# Patient Record
Sex: Female | Born: 1965 | Race: White | Hispanic: No | Marital: Married | Smoking: Never smoker
Health system: Southern US, Community
[De-identification: ages and names within clinical notes are randomized; demographics above are authoritative.]

## PROBLEM LIST (undated history)

## (undated) DIAGNOSIS — R7303 Prediabetes: Secondary | ICD-10-CM

## (undated) DIAGNOSIS — K469 Unspecified abdominal hernia without obstruction or gangrene: Secondary | ICD-10-CM

## (undated) DIAGNOSIS — R011 Cardiac murmur, unspecified: Secondary | ICD-10-CM

## (undated) DIAGNOSIS — Z9889 Other specified postprocedural states: Secondary | ICD-10-CM

## (undated) DIAGNOSIS — T8859XA Other complications of anesthesia, initial encounter: Secondary | ICD-10-CM

## (undated) DIAGNOSIS — J45909 Unspecified asthma, uncomplicated: Secondary | ICD-10-CM

## (undated) DIAGNOSIS — R112 Nausea with vomiting, unspecified: Secondary | ICD-10-CM

## (undated) DIAGNOSIS — I10 Essential (primary) hypertension: Secondary | ICD-10-CM

## (undated) HISTORY — PX: ABDOMINAL HYSTERECTOMY: SHX81

## (undated) HISTORY — PX: APPENDECTOMY: SHX54

## (undated) HISTORY — PX: MYOMECTOMY: SHX85

---

## 1997-09-24 ENCOUNTER — Other Ambulatory Visit: Admission: RE | Admit: 1997-09-24 | Discharge: 1997-09-24 | Payer: Self-pay | Admitting: Gynecology

## 1997-10-28 ENCOUNTER — Encounter: Admission: RE | Admit: 1997-10-28 | Discharge: 1998-01-26 | Payer: Self-pay | Admitting: Gynecology

## 1998-02-26 ENCOUNTER — Encounter: Admission: RE | Admit: 1998-02-26 | Discharge: 1998-05-27 | Payer: Self-pay | Admitting: Obstetrics and Gynecology

## 1998-05-04 ENCOUNTER — Inpatient Hospital Stay (HOSPITAL_COMMUNITY): Admission: AD | Admit: 1998-05-04 | Discharge: 1998-05-07 | Payer: Self-pay | Admitting: Gynecology

## 1998-05-13 ENCOUNTER — Inpatient Hospital Stay (HOSPITAL_COMMUNITY): Admission: AD | Admit: 1998-05-13 | Discharge: 1998-05-13 | Payer: Self-pay | Admitting: Obstetrics and Gynecology

## 1998-05-14 ENCOUNTER — Inpatient Hospital Stay (HOSPITAL_COMMUNITY): Admission: AD | Admit: 1998-05-14 | Discharge: 1998-05-14 | Payer: Self-pay | Admitting: Gynecology

## 1998-06-30 ENCOUNTER — Other Ambulatory Visit: Admission: RE | Admit: 1998-06-30 | Discharge: 1998-06-30 | Payer: Self-pay | Admitting: Gynecology

## 1998-11-24 ENCOUNTER — Other Ambulatory Visit: Admission: RE | Admit: 1998-11-24 | Discharge: 1998-11-24 | Payer: Self-pay | Admitting: Gynecology

## 1999-01-13 ENCOUNTER — Encounter (INDEPENDENT_AMBULATORY_CARE_PROVIDER_SITE_OTHER): Payer: Self-pay | Admitting: Specialist

## 1999-01-13 ENCOUNTER — Other Ambulatory Visit: Admission: RE | Admit: 1999-01-13 | Discharge: 1999-01-13 | Payer: Self-pay | Admitting: Gynecology

## 1999-04-13 ENCOUNTER — Other Ambulatory Visit: Admission: RE | Admit: 1999-04-13 | Discharge: 1999-04-13 | Payer: Self-pay | Admitting: Gynecology

## 1999-07-13 ENCOUNTER — Other Ambulatory Visit: Admission: RE | Admit: 1999-07-13 | Discharge: 1999-07-13 | Payer: Self-pay | Admitting: Gynecology

## 1999-08-03 ENCOUNTER — Encounter: Admission: RE | Admit: 1999-08-03 | Discharge: 1999-11-01 | Payer: Self-pay | Admitting: Gynecology

## 2000-07-23 ENCOUNTER — Other Ambulatory Visit: Admission: RE | Admit: 2000-07-23 | Discharge: 2000-07-23 | Payer: Self-pay | Admitting: Gynecology

## 2003-01-23 ENCOUNTER — Other Ambulatory Visit: Admission: RE | Admit: 2003-01-23 | Discharge: 2003-01-23 | Payer: Self-pay | Admitting: Gynecology

## 2004-01-15 ENCOUNTER — Emergency Department (HOSPITAL_COMMUNITY): Admission: EM | Admit: 2004-01-15 | Discharge: 2004-01-15 | Payer: Self-pay | Admitting: Family Medicine

## 2004-01-25 ENCOUNTER — Other Ambulatory Visit: Admission: RE | Admit: 2004-01-25 | Discharge: 2004-01-25 | Payer: Self-pay | Admitting: Gynecology

## 2004-05-23 ENCOUNTER — Emergency Department (HOSPITAL_COMMUNITY): Admission: EM | Admit: 2004-05-23 | Discharge: 2004-05-23 | Payer: Self-pay | Admitting: Family Medicine

## 2004-06-12 ENCOUNTER — Emergency Department (HOSPITAL_COMMUNITY): Admission: EM | Admit: 2004-06-12 | Discharge: 2004-06-12 | Payer: Self-pay | Admitting: Family Medicine

## 2004-06-13 ENCOUNTER — Emergency Department (HOSPITAL_COMMUNITY): Admission: EM | Admit: 2004-06-13 | Discharge: 2004-06-13 | Payer: Self-pay | Admitting: Family Medicine

## 2005-09-26 ENCOUNTER — Emergency Department (HOSPITAL_COMMUNITY): Admission: EM | Admit: 2005-09-26 | Discharge: 2005-09-26 | Payer: Self-pay | Admitting: Family Medicine

## 2005-11-28 ENCOUNTER — Other Ambulatory Visit: Admission: RE | Admit: 2005-11-28 | Discharge: 2005-11-28 | Payer: Self-pay | Admitting: Gynecology

## 2006-03-24 ENCOUNTER — Emergency Department (HOSPITAL_COMMUNITY): Admission: EM | Admit: 2006-03-24 | Discharge: 2006-03-24 | Payer: Self-pay | Admitting: Family Medicine

## 2006-09-19 ENCOUNTER — Emergency Department (HOSPITAL_COMMUNITY): Admission: EM | Admit: 2006-09-19 | Discharge: 2006-09-19 | Payer: Self-pay | Admitting: Family Medicine

## 2007-06-01 ENCOUNTER — Emergency Department (HOSPITAL_COMMUNITY): Admission: EM | Admit: 2007-06-01 | Discharge: 2007-06-01 | Payer: Self-pay | Admitting: Family Medicine

## 2007-07-24 ENCOUNTER — Emergency Department (HOSPITAL_COMMUNITY): Admission: EM | Admit: 2007-07-24 | Discharge: 2007-07-24 | Payer: Self-pay | Admitting: Family Medicine

## 2008-03-18 ENCOUNTER — Encounter: Payer: Self-pay | Admitting: Women's Health

## 2008-03-18 ENCOUNTER — Ambulatory Visit: Payer: Self-pay | Admitting: Women's Health

## 2008-03-18 ENCOUNTER — Other Ambulatory Visit: Admission: RE | Admit: 2008-03-18 | Discharge: 2008-03-18 | Payer: Self-pay | Admitting: Obstetrics and Gynecology

## 2008-04-01 ENCOUNTER — Ambulatory Visit: Payer: Self-pay | Admitting: Women's Health

## 2008-06-25 ENCOUNTER — Ambulatory Visit: Payer: Self-pay | Admitting: Obstetrics & Gynecology

## 2008-06-25 LAB — CONVERTED CEMR LAB
CA 125: 11.1 units/mL (ref 0.0–30.2)
HCT: 38.1 % (ref 36.0–46.0)
Hemoglobin: 11.9 g/dL — ABNORMAL LOW (ref 12.0–15.0)
MCHC: 31.2 g/dL (ref 30.0–36.0)
MCV: 74.4 fL — ABNORMAL LOW (ref 78.0–100.0)
Platelets: 276 10*3/uL (ref 150–400)
RBC: 5.12 M/uL — ABNORMAL HIGH (ref 3.87–5.11)
RDW: 16.9 % — ABNORMAL HIGH (ref 11.5–15.5)
WBC: 10.3 10*3/uL (ref 4.0–10.5)

## 2008-06-30 ENCOUNTER — Ambulatory Visit (HOSPITAL_COMMUNITY): Admission: RE | Admit: 2008-06-30 | Discharge: 2008-06-30 | Payer: Self-pay | Admitting: Obstetrics & Gynecology

## 2008-07-09 ENCOUNTER — Ambulatory Visit: Payer: Self-pay | Admitting: Obstetrics and Gynecology

## 2008-07-28 ENCOUNTER — Inpatient Hospital Stay (HOSPITAL_COMMUNITY): Admission: RE | Admit: 2008-07-28 | Discharge: 2008-08-01 | Payer: Self-pay | Admitting: Obstetrics & Gynecology

## 2008-07-28 ENCOUNTER — Encounter: Payer: Self-pay | Admitting: Obstetrics & Gynecology

## 2008-07-28 ENCOUNTER — Ambulatory Visit: Payer: Self-pay | Admitting: Obstetrics & Gynecology

## 2008-08-17 ENCOUNTER — Ambulatory Visit: Payer: Self-pay | Admitting: Advanced Practice Midwife

## 2008-08-17 ENCOUNTER — Inpatient Hospital Stay (HOSPITAL_COMMUNITY): Admission: AD | Admit: 2008-08-17 | Discharge: 2008-08-17 | Payer: Self-pay | Admitting: Obstetrics & Gynecology

## 2008-09-10 ENCOUNTER — Ambulatory Visit: Payer: Self-pay | Admitting: Obstetrics & Gynecology

## 2009-02-04 ENCOUNTER — Encounter: Admission: RE | Admit: 2009-02-04 | Discharge: 2009-02-04 | Payer: Self-pay | Admitting: General Surgery

## 2010-08-24 LAB — POCT URINALYSIS DIP (DEVICE)
Bilirubin Urine: NEGATIVE
Glucose, UA: NEGATIVE mg/dL
Hgb urine dipstick: NEGATIVE
Ketones, ur: NEGATIVE mg/dL
Nitrite: NEGATIVE
Protein, ur: NEGATIVE mg/dL
Specific Gravity, Urine: 1.02 (ref 1.005–1.030)
Urobilinogen, UA: 0.2 mg/dL (ref 0.0–1.0)
pH: 6 (ref 5.0–8.0)

## 2010-08-25 LAB — URINALYSIS, ROUTINE W REFLEX MICROSCOPIC
Bilirubin Urine: NEGATIVE
Glucose, UA: NEGATIVE mg/dL
Ketones, ur: NEGATIVE mg/dL
Leukocytes, UA: NEGATIVE
Nitrite: NEGATIVE
Protein, ur: NEGATIVE mg/dL
Specific Gravity, Urine: 1.02 (ref 1.005–1.030)
Urobilinogen, UA: 0.2 mg/dL (ref 0.0–1.0)
pH: 6 (ref 5.0–8.0)

## 2010-08-25 LAB — CBC
HCT: 27.2 % — ABNORMAL LOW (ref 36.0–46.0)
HCT: 37.7 % (ref 36.0–46.0)
Hemoglobin: 8.9 g/dL — ABNORMAL LOW (ref 12.0–15.0)
Hemoglobin: 12.2 g/dL (ref 12.0–15.0)
MCHC: 32.6 g/dL (ref 30.0–36.0)
MCHC: 32.3 g/dL (ref 30.0–36.0)
MCV: 77.8 fL — ABNORMAL LOW (ref 78.0–100.0)
MCV: 77.2 fL — ABNORMAL LOW (ref 78.0–100.0)
Platelets: 222 10*3/uL (ref 150–400)
Platelets: 231 10*3/uL (ref 150–400)
RBC: 3.5 MIL/uL — ABNORMAL LOW (ref 3.87–5.11)
RBC: 4.89 MIL/uL (ref 3.87–5.11)
RDW: 20.9 % — ABNORMAL HIGH (ref 11.5–15.5)
RDW: 21 % — ABNORMAL HIGH (ref 11.5–15.5)
WBC: 11.5 10*3/uL — ABNORMAL HIGH (ref 4.0–10.5)
WBC: 5.8 10*3/uL (ref 4.0–10.5)

## 2010-08-25 LAB — BASIC METABOLIC PANEL
BUN: 8 mg/dL (ref 6–23)
CO2: 31 mEq/L (ref 19–32)
Calcium: 9.1 mg/dL (ref 8.4–10.5)
Chloride: 100 mEq/L (ref 96–112)
Creatinine, Ser: 0.64 mg/dL (ref 0.4–1.2)
GFR calc Af Amer: >60 mL/min (ref >60)
GFR calc non Af Amer: >60 mL/min (ref >60)
Glucose, Bld: 97 mg/dL (ref 70–99)
Potassium: 3.3 mEq/L — ABNORMAL LOW (ref 3.5–5.1)
Sodium: 138 mEq/L (ref 135–145)

## 2010-08-25 LAB — URINE MICROSCOPIC-ADD ON

## 2010-08-25 LAB — PREGNANCY, URINE: Preg Test, Ur: NEGATIVE

## 2010-09-27 NOTE — Group Therapy Note (Signed)
NAME:  Kaitlyn Bray, Kaitlyn Bray NO.:  [id number]   MEDICAL RECORD NO.:  [id number]          PATIENT TYPE:  WOC   LOCATION:  [facility]                   FACILITY:  [facility]   PHYSICIAN:  Argentina Donovan, MD        DATE OF BIRTH:  20-Mar-1966   DATE OF SERVICE:  07/09/2008                                  CLINIC NOTE   The patient is a 45 year old Caucasian female, gravida 1, para 1-0-0-1  with child 81 years old who was in several weeks ago and saw Dr. Scheryl Darter.  At that time, she came in because of right adnexal complex  cyst.  He sent her for a CT confirmation which showed a complex cystic  mass in the right adnexa with enhancing internal septations and  worrisome for cyst adenoma or cystadenocarcinoma.  Excision was  recommended.  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  CA-125 that  he ran was 11.1.  The patient has been on iron for anemia and her last  hemoglobin was 11.9 with a hematocrit of 38.1.  She is in good health,  has just recently been started on hydrochlorothiazide 25 a day for  hypertension but today her blood pressure was 121/84.  The patient is 5  feet 4 inches tall and weighs 226 pounds and has an ALLERGY TO SULFA AND  CODEINE.   MEDICATIONS:  The only medication she is taking is hydrochlorothiazide,  Slow Female, and Advil for occasional aches and pains.   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  I am going to talk  to Dr. Debroah Loop about the case.  He requested I fill out the papers for  surgery which I am going  to do.  The patient is being seen by a  practitioner nearby and I have asked her to have them do a history and  physical on her as she is seeing them in the next week or so.  I am  going to have her come in and see Dr. Debroah Loop prior to the surgery so he  remembers who the patient is and can handle any other suggestions that  he may want to take care of.   IMPRESSION:  Complex right ovarian cyst with a fibroid uterus suspicious  for cyst adenoma or cystadenocarcinoma.           ______________________________  Argentina Donovan, MD     PR/MEDQ  D:  07/09/2008  T:  07/09/2008  Job:  [id number]

## 2010-09-27 NOTE — Group Therapy Note (Signed)
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 Adine Madura and Dr. Claud Kelp on July 28, 2008.  The patient had  uncomplicated postoperative course and returns now for her 6-week  postoperative checkup.  She was treated for cellulitis in the ER several  weeks ago but is doing fine now.  She is complaining of this right lower  quadrant mass effect and I did look up it on a CT scan done June 30, 2008 and there is a lower anterior abdominal wall hernia containing only  fat and when you palpate this area, it does seem like there is some  fatty tissue that is forming a mass.  The patient is also having some  urgency.  She had urgency before the procedure and it seems to be a  little better but still present.  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  If it starts to impede her lifestyle more, she would  consider going on one of these muscarinic inhibitors like Ditropan.   PHYSICAL EXAM:  Temperature 98.6, pulse 89, blood pressure 120/72.  Weight 222 pounds, height 5 feet 4-1/2 inches.  GENERAL:  Well nourished, well developed, in no apparent distress.  HEENT:  Normocephalic, atraumatic with left lazy eye.  ABDOMEN:  Obese, soft, nontender.  The right lower mass can be felt when  the patient is upright, not in supine position.  The Pfannenstiel  incision is well-healed.  GENITALIA:  Tanner V.  Vagina pink.  Normal rugae.  The vaginal vault is  intact.  Bimanual is unremarkable.   ASSESSMENT AND  PLAN:  A 45 year old female status post total abdominal  hysterectomy, right salpingo-oophorectomy, excision of endometrium off  the rectosigmoid, and appendectomy.  1. Right abdominal wall hernia.  Referred to general surgery for      evaluation of hernia.  2. The patient will contact us if she would like to be treated for her      urge incontinence.  3. The patient is originally a patient of [facility] but      they transferred her to [place] due to lack of insurance and needing      surgery.  She would like to go back to them and they are at liberty      to request her records when she does return there.  4. I will let them take care of all of her health care maintenance at      this point.  She did have a normal Pap smear in 2009 and there was  no evidence of dysplasia on the cervix, so she would not need a Pap      smear any more.           ______________________________  Elsie Lincoln, MD     KL/MEDQ  D:  09/10/2008  T:  09/10/2008  Job:  [id number]

## 2010-09-27 NOTE — Op Note (Signed)
Kaitlyn Bray, Kaitlyn Bray               ACCOUNT NO.:  [id number]   MEDICAL RECORD NO.:  [id number]          PATIENT TYPE:  INP   LOCATION:  9312                          [facility]   PHYSICIAN:  Angelia Mould. Derrell Lolling, M.D.DATE OF BIRTH:  1965-10-27   DATE OF PROCEDURE:  07/28/2008  DATE OF DISCHARGE:                               OPERATIVE REPORT   PREOPERATIVE DIAGNOSES:  1. Menorrhagia.  2. Complex right adnexal mass.  3. Fibroid uterus.   POSTOPERATIVE DIAGNOSES:  1. Menorrhagia.  2. Complex right adnexal mass.  3. Fibroid uterus, plus cystic mass of wall of rectosigmoid colon, and      mass versus inflammatory change of appendix, severe adhesions.   OPERATION PERFORMED:  1. Intraoperative general surgery consultation.  2. Excision of cystic mass of the right posterior lateral wall of the      rectosigmoid colon.  3. Indicated appendectomy.   SURGEON:  Dr. Angelia Mould. Derrell Lolling, MD   ASSISTANT:  Dr. Scheryl Darter, MD.   OPERATIVE INDICATIONS:  This is a 45 year old woman who was brought to  the operating room electively by Dr. Scheryl Darter today because of  menorrhagia, a fibroid uterus, and a complex right adnexal mass.  Past  history is consistent with hypertension and asthma, tobacco abuse.   Dr. Debroah Loop was performing a hysterectomy and bilateral salpingo-  oophorectomy through a Pfannenstiel incision today.  He called me to  evaluate a palpable mass in the rectosigmoid colon and also a very  thickened abnormal appendix that was caught up in adhesions.  He had  already performed a hysterectomy.   OPERATIVE FINDINGS:  When I scrubbed in, I found that the uterus were  surgically absent.  The cecum looked normal.  The appendix had been  dissected free from pelvic adhesions and the base of the appendix felt  normal, but the last centimeter and half was markedly thickened.  I did  not know whether this was due to chronic inflammation or malignancy.  Because of this, I felt  appendectomy would be indicated.  In the  rectosigmoid colon on the right lateral and right posterior side, the  right adnexa were densely adherent to this, and there was a palpable  cystic process seemingly in the wall of the colon.  Once I dissected  this free, I found that this was extrinsic to the colon and possibly  related to the adnexal process.   OPERATIVE TECHNIQUE:  The gynecologic procedure will be dictated by Dr.  Scheryl Darter.   I scrubbed in and explored the lower abdomen and pelvis and findings as  described above.  I examined and identified the rectum and the distal  sigmoid colon.  I examined and identified the cecum and the appendix.  I  examined and identified the right adnexa which was densely adherent to  the right posterolateral wall of the rectosigmoid colon.  Palpation of  this colon mass suggested a cystic smooth process.  I dissected the  right adnexal tissues off the colon with sharp and blunt dissection and  this was uneventful.  There was no evidence of  any cancer or invasion of  the wall of the colon.  We did not see any diverticula here.  We did not  see any serosal injury.  Once I dissected the adnexa off, I could rotate  the colon around and see that the cystic process was adherent to the  wall of the rectosigmoid, a chronic soft adhesions, but was not part of  the colon.  We slowly dissected all of the soft filmy adhesions and  removed the cystic mass.  A couple of bleeders were controlled with  electrocautery.  I examined the rectosigmoid colon and it appeared  completely intact with no signs of any injury, not even a serosal tear.  I did not see any diverticula in this area either.  We irrigated out  this area.   We talked about removing the appendix.  Because there was a mass effect  in the distal appendix, I felt that it needed to be removed and Dr.  Debroah Loop agreed.  The mesentery was controlled with hemostats, divided,  and ligated with 2-0 Vicryl  ties.  This took 2 or 3 steps.  We then had  the base of the appendix completely exposed.  The pursestring suture of  3-0 silk was placed in the cecum around the base of the appendix.  The  base of the appendix was tied off with a 3-0 Vicryl time and then we  clamped the appendix and amputated.  The stump of the appendix was  inverted and the pursestring suture tied.  This looked pretty good, but  I did a second Z stitch of 3-0 silk to further imbricate the  seromuscular wall of the cecum.  This provided a very nice imbricated  closure of the cecal wall.  I then irrigated out the lower abdomen and  pelvis.  There did not seem to be any bleeding anywhere except perhaps  from the right adnexa.  Dr. Debroah Loop stated that he was going to proceed  with resection of the right adnexa.   At this point, I scrubbed out of the case.  Estimated blood loss during  the time I was there was probably about 30 mL and complications none.      Angelia Mould. Derrell Lolling, M.D.  Electronically Signed     HMI/MEDQ  D:  07/28/2008  T:  07/29/2008  Job:  [id number]   cc:   Scheryl Darter, MD

## 2010-09-27 NOTE — Op Note (Signed)
Kaitlyn Bray, Kaitlyn Bray               ACCOUNT NO.:  [id number]   MEDICAL RECORD NO.:  [id number]          PATIENT TYPE:  INP   LOCATION:  9312                          [facility]   PHYSICIAN:  Scheryl Darter, MD       DATE OF BIRTH:  1965-11-05   DATE OF PROCEDURE:  07/28/2008  DATE OF DISCHARGE:                               OPERATIVE REPORT   PROCEDURE:  Total abdominal hysterectomy, lysis of adhesions, right  salpingo-oophorectomy, appendectomy, and removal of the cyst from the  rectosigmoid colon.   PREOPERATIVE DIAGNOSES:  Fibroid uterus and complex right adnexal mass.   POSTOPERATIVE DIAGNOSES:  1. Fibroid uterus.  2. Pelvic cyst adherent to the rectosigmoid colon.  3. Cystic right ovary.  4. Pelvic adhesions.   SURGEON:  Scheryl Darter, MD.   ASSISTANT:  Dr. Okey Dupre.   Intraoperative consult by Dr. Derrell Lolling for appendectomy and removal of the  rectosigmoid cyst.   ESTIMATED BLOOD LOSS:  800 mL.   COMPLICATIONS:  None.   DRAINS:  Foley catheter.   COUNTS:  Correct.   OPERATIVE COURSE:  The patient gave written consent for total abdominal  hysterectomy, right salpingo-oophorectomy, and possible bilateral  salpingo-oophorectomy due to fibroid uterus, menorrhagia, anemia, and a  complex right adnexal mass.  The patient's identification was confirmed.  She was brought to the OR and general anesthesia was induced.  She was  placed in dorsal supine position.  Abdomen and vagina were sterilely  prepped and draped, and a Foley catheter was placed.  A #10 blade was  used to make Pfannenstiel incision.  Incision was carried down to the  fascia.  Peritoneum was entered at the same time as the fascia.  The  incision was extended transversely with Mayo scissors and the fascia was  separated from underlying adhesions to the rectus muscle.  There were  adhesions of omentum to the anterior wall which were taken down using  cautery.  The uterus was moderately enlarged with fibroids.   Left ovary  was seen and appeared normal.  There was dense adhesions on the right  side of the uterus.  The ovary was not easily visible.  Moist lap pads  were used to pack bowel and a self-retaining retractor was placed.  Uterus was elevated by clamping across the adnexa with Kelly clamps.  A  right round ligament was clamped, cut, and suture ligated with 0 Vicryl  allowing access to the anterior leaf of the broad ligament which was  opened with Metzenbaum scissors.  Window through the posterior leaf of  the broad ligament was created and the adnexal pedicle was cross clamped  distal to the ovary.  Pedicle was cut and double ligatures were placed  with 0 Vicryl.  Good hemostasis was seen.  The uterine vessels on the  left side were skeletonized and the right adnexa was clamped and opened.  The double ligatures were placed.  The round ligament is difficult to  identify on the left.  The anterior leaf of the broad ligament was  opened and bladder was dissected away from the anterior uterus.  The  uterine vessels were identified on the right side and clamped with  Heaney clamps, cut, and suture ligated with 0 Vicryl.  Good hemostasis  was seen.  Likewise, uterine vessels on both sides were clamped, cut,  and suture ligated.  The cardinal ligaments were clamped, cut, and  suture ligated.  At that point, it was elected to remove the uterine  fundus which was done with Mayo scissors.  The cervix was grasped and  elevated with Allis clamps.  The uterosacral ligaments were clamped,  cut, and suture ligated, and the vagina was entered, and the cervix was  removed.  Vaginal cuff was closed with a running locking suture with 0  Vicryl.  Hemostasis was obtained with interrupted sutures with 0 Vicryl.  Attention turned to identifying the mass on the right side.  Right neck  was densely adherent to the right pelvic sidewall.  This also appeared  to be adherent to the rectosigmoid colon.  The cystic  mass adherent or  intrinsic to the rectosigmoid colon was identified and it was difficult  to find a tissue plane for dissection.  Dr. Derrell Lolling was consulted and he  performed intraoperative consult.  He dictated his portion of the  procedure.  The cystic mass was dissected from the rectosigmoid colon.  The bowel appeared to be intact and did not require any repair.  At this  point, the right adnexa which was adherent to the sidewall appeared to  have chocolate cyst fluid draining and is consistent with possible  endometriomas.  Identified the right infundibulopelvic ligament.  This  was cross clamped, cut, and suture ligated.  Good hemostasis was seen.  The ovary was dissected away from the sidewall and adhesions by clamping  with right angles.  Right angle clamps, cutting, and suture ligated.  Mass was completely excised and sent to pathology.  There was a good  hemostasis.  Pelvis was irrigated.  As the anterior peritoneum could not  be closed, it was elected to close the fascia.  All packs were removed  and self-retaining retractors were removed.  Fascia was closed with  running suture with 0 Vicryl.  Skin incision showed good hemostasis and  was irrigated.  Skin was closed with running subcuticular suture with 4-  0 Vicryl.  Sterile dressing was applied.  Please note that Dr. Derrell Lolling  also performed an appendectomy.  The patient tolerated the procedure  well without complications.  Estimated blood loss was about 800 mL.  She  is brought in stable condition to recovery room.      Scheryl Darter, MD  Electronically Signed     JA/MEDQ  D:  07/28/2008  T:  07/29/2008  Job:  [id number]

## 2010-09-27 NOTE — Discharge Summary (Signed)
NAMEELDINE, RENCHER               ACCOUNT NO.:  [id number]   MEDICAL RECORD NO.:  [id number]          PATIENT TYPE:  INP   LOCATION:  9312                          [facility]   PHYSICIAN:  Lesly Dukes, M.D. DATE OF BIRTH:  [birth year]   DATE OF ADMISSION:  07/28/2008  DATE OF DISCHARGE:  08/01/2008                               DISCHARGE SUMMARY   HOSPITAL COURSE:  The patient is 45 year old female who underwent total  abdominal hysterectomy, right salpingo-oophorectomy, lysis of adhesions,  appendectomy, right adnexal mass resection from rectosigmoid on July 28, 2008.  This was done for fibroid uterus and right adnexal mass.  There was a general surgery consult done for extensive adhesions and  details of this can be seen in the dictated operative note.  The patient  had a normal postoperative care with minimal bowel sounds and no flatus  on day #2.  She was kept until day #3 and now she is passing up well.  The patient's discharge laboratory values showed white blood cell count  of 11.5, hemoglobin 8.9, hematocrit 27.2, platelet 222.   DISCHARGE MEDICATIONS:  1. Hydrochlorothiazide 25 mg 1 tablet p.o. daily.  2. Ferrous sulfate 325 mg 1 tablet p.o. b.i.d.  3. Colace 100 mg 1 tablet p.o. b.i.d.  4. Percocet 5/325 one to two tablets p.o. q.4-6 h. p.r.n.   DISCHARGE CONDITION:  Stable.   DISCHARGE INSTRUCTIONS:  1. No heavy lifting x6 weeks.  2. No vaginal intercourse/vaginal rest for 6 weeks.  3. The patient to come to the emergency room or call our clinic with      fever, intractable nausea, vomiting, severe abdominal pain, or      opening of the wound.  4. Come back to our clinic in 6 weeks for a postoperative checkup      Lesly Dukes, M.D.  Electronically Signed     KHL/MEDQ  D:  08/01/2008  T:  08/02/2008  Job:  [id number]

## 2011-10-17 ENCOUNTER — Encounter (HOSPITAL_COMMUNITY): Payer: Self-pay | Admitting: *Deleted

## 2011-10-17 ENCOUNTER — Emergency Department (HOSPITAL_COMMUNITY)
Admission: EM | Admit: 2011-10-17 | Discharge: 2011-10-17 | Disposition: A | Discharge status: Home / Self Care | Payer: Self-pay | Attending: Emergency Medicine | Admitting: Emergency Medicine

## 2011-10-17 DIAGNOSIS — I10 Essential (primary) hypertension: Secondary | ICD-10-CM | POA: Insufficient documentation

## 2011-10-17 DIAGNOSIS — G44209 Tension-type headache, unspecified, not intractable: Secondary | ICD-10-CM | POA: Insufficient documentation

## 2011-10-17 HISTORY — DX: Essential (primary) hypertension: I10

## 2011-10-17 LAB — DIFFERENTIAL
Basophils Absolute: 0 10*3/uL (ref 0.0–0.1)
Basophils Relative: 0 % (ref 0–1)
Eosinophils Absolute: 0.2 10*3/uL (ref 0.0–0.7)
Eosinophils Relative: 3 % (ref 0–5)
Lymphocytes Relative: 27 % (ref 12–46)
Lymphs Abs: 2.4 10*3/uL (ref 0.7–4.0)
Monocytes Absolute: 0.5 10*3/uL (ref 0.1–1.0)
Monocytes Relative: 5 % (ref 3–12)
Neutro Abs: 5.8 10*3/uL (ref 1.7–7.7)
Neutrophils Relative %: 65 % (ref 43–77)

## 2011-10-17 LAB — COMPREHENSIVE METABOLIC PANEL
ALT: 12 U/L (ref 0–35)
AST: 15 U/L (ref 0–37)
Albumin: 3.7 g/dL (ref 3.5–5.2)
Alkaline Phosphatase: 77 U/L (ref 39–117)
BUN: 14 mg/dL (ref 6–23)
CO2: 24 mEq/L (ref 19–32)
Calcium: 9.6 mg/dL (ref 8.4–10.5)
Chloride: 101 mEq/L (ref 96–112)
Creatinine, Ser: 0.61 mg/dL (ref 0.50–1.10)
GFR calc Af Amer: >90 mL/min (ref >90)
GFR calc non Af Amer: >90 mL/min (ref >90)
Glucose, Bld: 96 mg/dL (ref 70–99)
Potassium: 3.9 mEq/L (ref 3.5–5.1)
Sodium: 138 mEq/L (ref 135–145)
Total Bilirubin: 0.2 mg/dL — ABNORMAL LOW (ref 0.3–1.2)
Total Protein: 7.1 g/dL (ref 6.0–8.3)

## 2011-10-17 LAB — URINE MICROSCOPIC-ADD ON

## 2011-10-17 LAB — CBC
HCT: 40.7 % (ref 36.0–46.0)
Hemoglobin: 13.5 g/dL (ref 12.0–15.0)
MCH: 28.5 pg (ref 26.0–34.0)
MCHC: 33.2 g/dL (ref 30.0–36.0)
MCV: 85.9 fL (ref 78.0–100.0)
Platelets: 233 10*3/uL (ref 150–400)
RBC: 4.74 MIL/uL (ref 3.87–5.11)
RDW: 13.2 % (ref 11.5–15.5)
WBC: 8.9 10*3/uL (ref 4.0–10.5)

## 2011-10-17 LAB — URINALYSIS, ROUTINE W REFLEX MICROSCOPIC
Bilirubin Urine: NEGATIVE
Glucose, UA: NEGATIVE mg/dL
Hgb urine dipstick: NEGATIVE
Ketones, ur: NEGATIVE mg/dL
Nitrite: NEGATIVE
Protein, ur: NEGATIVE mg/dL
Specific Gravity, Urine: 1.023 (ref 1.005–1.030)
Urobilinogen, UA: 0.2 mg/dL (ref 0.0–1.0)
pH: 5.5 (ref 5.0–8.0)

## 2011-10-17 MED ORDER — KETOROLAC TROMETHAMINE 30 MG/ML IJ SOLN
30.0000 mg | INTRAMUSCULAR | Status: AC
  Administered 2011-10-17: 30 mg via INTRAMUSCULAR
  Filled 2011-10-17: qty 1

## 2011-10-17 NOTE — ED Notes (Signed)
The pt has had a headache neck pain and her bp had been high since last pm

## 2011-10-17 NOTE — ED Provider Notes (Signed)
History     CSN: [id number]  Arrival date & time 10/17/11  2008   First MD Initiated Contact with Patient 10/17/11 2143      Chief Complaint  Patient presents with  . Headache    (Consider location/radiation/quality/duration/timing/severity/associated sxs/prior treatment) Patient is a 46 y.o. female presenting with headaches. The history is provided by the patient.  Headache  This is a new problem. The current episode started 6 to 12 hours ago. The problem occurs constantly. The problem has been resolved. Associated with: stress. The pain is located in the occipital region. The quality of the pain is described as throbbing. The pain is mild. Radiates to: neck. Pertinent negatives include no fever, no shortness of breath, no nausea and no vomiting. She has tried acetaminophen for the symptoms. The treatment provided moderate relief.    Past Medical History  Diagnosis Date  . Hypertension     History reviewed. No pertinent past surgical history.  No family history on file.  History  Substance Use Topics  . Smoking status: Never Smoker   . Smokeless tobacco: Not on file  . Alcohol Use: No    OB History    Grav Para Term Preterm Abortions TAB SAB Ect Mult Living                  Review of Systems  Constitutional: Negative for fever and fatigue.  HENT: Negative for congestion, drooling and neck pain.   Eyes: Negative for pain.  Respiratory: Negative for cough and shortness of breath.   Cardiovascular: Negative for chest pain.  Gastrointestinal: Negative for nausea, vomiting, abdominal pain and diarrhea.  Genitourinary: Negative for dysuria and hematuria.  Musculoskeletal: Negative for back pain and gait problem.  Skin: Negative for color change.  Neurological: Positive for headaches. Negative for dizziness.  Hematological: Negative for adenopathy.  Psychiatric/Behavioral: Negative for behavioral problems.  All other systems reviewed and are negative.    Allergies    Codeine; Penicillins; and Sulfa drugs cross reactors  Home Medications   Current Outpatient Rx  Name Route Sig Dispense Refill  . METOPROLOL SUCCINATE ER 100 MG PO TB24 Oral Take 100 mg by mouth daily. Take with or immediately following a meal.      BP 153/88  Pulse 75  Temp(Src) 98.1 F (36.7 C) (Oral)  Resp 14  SpO2 96%  Physical Exam  Nursing note and vitals reviewed. Constitutional: She is oriented to person, place, and time. She appears well-developed and well-nourished.  HENT:  Head: Normocephalic.  Mouth/Throat: No oropharyngeal exudate.  Eyes: Conjunctivae and EOM are normal. Pupils are equal, round, and reactive to light.  Neck: Normal range of motion. Neck supple.  Cardiovascular: Normal rate, regular rhythm, normal heart sounds and intact distal pulses.  Exam reveals no gallop and no friction rub.   No murmur heard. Pulmonary/Chest: Effort normal and breath sounds normal. No respiratory distress. She has no wheezes.  Abdominal: Soft. Bowel sounds are normal. There is no tenderness. There is no rebound and no guarding.  Musculoskeletal: Normal range of motion. She exhibits no edema and no tenderness.  Neurological: She is alert and oriented to person, place, and time. She has normal strength. No cranial nerve deficit or sensory deficit. Coordination normal.  Skin: Skin is warm and dry.  Psychiatric: She has a normal mood and affect. Her behavior is normal.    ED Course  Procedures (including critical care time)  Labs Reviewed  URINALYSIS, ROUTINE W REFLEX MICROSCOPIC - Abnormal; Notable  for the following:    Leukocytes, UA TRACE (*)    All other components within normal limits  COMPREHENSIVE METABOLIC PANEL - Abnormal; Notable for the following:    Total Bilirubin 0.2 (*)    All other components within normal limits  URINE MICROSCOPIC-ADD ON - Abnormal; Notable for the following:    Squamous Epithelial / LPF FEW (*)    All other components within normal  limits  CBC  DIFFERENTIAL   No results found.   1. Tension headache     Date: 10/17/2011  Rate: 74  Rhythm: normal sinus rhythm  QRS Axis: normal  Intervals: normal  ST/T Wave abnormalities: nonspecific T wave changes  Conduction Disutrbances:none  Narrative Interpretation: Non specific flipped T wave in aVF  Old EKG Reviewed: none available     MDM  11:51 PM 46 y.o. female pw HA that began this afternoon and has now resolved. Pt concerned bc elev BP reading w/ sys in the low 200's at a fire dept today. Pt AFVSS here, neurologically intact on exam. BP sys 140's here. Suspect tension HA. Pt has mild neck cramping, will give toradol for pain.   11:51 PM: Pt appears well and sx have resolved. I have discussed the diagnosis/risks/treatment options with the patient and believe the pt to be eligible for discharge home to follow-up with pcp as needed. We also discussed returning to the ED immediately if new or worsening sx occur. We discussed the sx which are most concerning (e.g., worsening HA) that necessitate immediate return. Any new prescriptions provided to the patient are listed below.  New Prescriptions   No medications on file   Clinical Impression 1. Tension headache            Purvis Sheffield, MD 10/17/11 2352

## 2011-10-17 NOTE — ED Notes (Signed)
Pt reports having a lot of stress at moment from work and her mother passing away. Pt reports she thinks this had caused her BP to be elevated and is now causing her to have headaches and some ear pain.

## 2011-10-17 NOTE — Discharge Instructions (Signed)
Tension Headache (Muscle Contraction Headache) Tension headache is one of the most common causes of head pain. These headaches are usually felt as a pain over the top of your head and back of your neck. Stress, anxiety, and depression are common triggers for these headaches. Tension headaches are not life-threatening and will not lead to other types of headaches. Tension headaches can often be diagnosed by taking a history from the patient and a physical exam. Sometimes, further lab and x-ray studies are used to confirm the diagnosis. Your caregiver can advise you on how to get help solving problems that cause anxiety or stress. Antidepressants can be prescribed if depression is a problem. HOME CARE INSTRUCTIONS   If testing was done, call for your results. Remember, it is your responsibility to get the results of all testing. Do not assume everything is fine because you do not hear from your caregiver.   Only take over-the-counter or prescription medicines for pain, discomfort, or fever as directed by your caregiver.   Biofeedback, massage, or other relaxation techniques may be helpful.   Ice packs or heat to the head and neck can be used. Use these three to four times per day or as needed.   Physical therapy may be a useful addition to treatment.   If headaches continue, even with therapy, you may need to think about lifestyle changes.   Avoid excessive use of pain killers, as rebound headaches can occur.  SEEK MEDICAL CARE IF:   You develop problems with medications prescribed.   You do not respond or get no relief from medications.   You have a change from the usual headache.   You develop nausea (feeling sick to your stomach) or vomiting.  SEEK IMMEDIATE MEDICAL CARE IF:   Your headache becomes severe.   You have an unexplained oral temperature above 102 F (38.9 C).   You develop a stiff neck.   You have loss of vision.   You have muscular weakness.   You have loss of  muscular control.   You develop severe symptoms different from your first symptoms.   You start losing your balance or have trouble walking.   You feel faint or pass out.  MAKE SURE YOU:   Understand these instructions.   Will watch your condition.   Will get help right away if you are not doing well or get worse.  Document Released: 05/01/2005 Document Revised: 04/20/2011 Document Reviewed: 12/19/2007 [employer] Patient Information 2012 [employer]. 

## 2011-10-18 NOTE — ED Provider Notes (Signed)
I saw and evaluated the patient, reviewed the resident's note and I agree with the findings and plan. Headaches. Likely musculoskeletal. No focal neuro deficits. Doubt intracranial findings. Patient will discharged home  Kaitlyn Bray. Rubin Payor, MD 10/18/11 0000

## 2012-02-10 ENCOUNTER — Emergency Department (HOSPITAL_COMMUNITY)
Admission: EM | Admit: 2012-02-10 | Discharge: 2012-02-10 | Disposition: A | Discharge status: Home / Self Care | Payer: Self-pay | Attending: Emergency Medicine | Admitting: Emergency Medicine

## 2012-02-10 ENCOUNTER — Encounter (HOSPITAL_COMMUNITY): Payer: Self-pay

## 2012-02-10 ENCOUNTER — Emergency Department (HOSPITAL_COMMUNITY): Payer: Self-pay

## 2012-02-10 ENCOUNTER — Other Ambulatory Visit: Payer: Self-pay

## 2012-02-10 DIAGNOSIS — R51 Headache: Secondary | ICD-10-CM | POA: Insufficient documentation

## 2012-02-10 DIAGNOSIS — R079 Chest pain, unspecified: Secondary | ICD-10-CM | POA: Insufficient documentation

## 2012-02-10 LAB — CBC
HCT: 39.7 % (ref 36.0–46.0)
Hemoglobin: 13.5 g/dL (ref 12.0–15.0)
MCH: 29.2 pg (ref 26.0–34.0)
MCHC: 34 g/dL (ref 30.0–36.0)
MCV: 85.7 fL (ref 78.0–100.0)
Platelets: 231 10*3/uL (ref 150–400)
RBC: 4.63 MIL/uL (ref 3.87–5.11)
RDW: 13.4 % (ref 11.5–15.5)
WBC: 10.7 10*3/uL — ABNORMAL HIGH (ref 4.0–10.5)

## 2012-02-10 LAB — BASIC METABOLIC PANEL
BUN: 11 mg/dL (ref 6–23)
CO2: 26 mEq/L (ref 19–32)
Calcium: 9.6 mg/dL (ref 8.4–10.5)
Chloride: 103 mEq/L (ref 96–112)
Creatinine, Ser: 0.62 mg/dL (ref 0.50–1.10)
GFR calc Af Amer: >90 mL/min (ref >90)
GFR calc non Af Amer: >90 mL/min (ref >90)
Glucose, Bld: 95 mg/dL (ref 70–99)
Potassium: 3.8 mEq/L (ref 3.5–5.1)
Sodium: 140 mEq/L (ref 135–145)

## 2012-02-10 LAB — POCT I-STAT TROPONIN I
Troponin i, poc: 0 ng/mL (ref 0.00–0.08)
Troponin i, poc: 0 ng/mL (ref 0.00–0.08)

## 2012-02-10 LAB — URINALYSIS, ROUTINE W REFLEX MICROSCOPIC
Bilirubin Urine: NEGATIVE
Glucose, UA: NEGATIVE mg/dL
Hgb urine dipstick: NEGATIVE
Ketones, ur: NEGATIVE mg/dL
Leukocytes, UA: NEGATIVE
Nitrite: NEGATIVE
Protein, ur: NEGATIVE mg/dL
Specific Gravity, Urine: 1.022 (ref 1.005–1.030)
Urobilinogen, UA: 0.2 mg/dL (ref 0.0–1.0)
pH: 5.5 (ref 5.0–8.0)

## 2012-02-10 MED ORDER — METOPROLOL TARTRATE 25 MG PO TABS
100.0000 mg | ORAL_TABLET | Freq: Once | ORAL | Status: AC
  Administered 2012-02-10: 100 mg via ORAL
  Filled 2012-02-10: qty 4

## 2012-02-10 MED ORDER — PROMETHAZINE HCL 25 MG/ML IJ SOLN
25.0000 mg | Freq: Once | INTRAMUSCULAR | Status: DC
  Filled 2012-02-10 (×2): qty 1

## 2012-02-10 MED ORDER — ONDANSETRON HCL 4 MG/2ML IJ SOLN
INTRAMUSCULAR | Status: AC
  Filled 2012-02-10: qty 2

## 2012-02-10 MED ORDER — ONDANSETRON HCL 4 MG/2ML IJ SOLN
4.0000 mg | Freq: Once | INTRAMUSCULAR | Status: AC
  Administered 2012-02-10: 4 mg via INTRAVENOUS

## 2012-02-10 MED ORDER — KETOROLAC TROMETHAMINE 30 MG/ML IJ SOLN
30.0000 mg | Freq: Once | INTRAMUSCULAR | Status: AC
  Administered 2012-02-10: 30 mg via INTRAVENOUS
  Filled 2012-02-10: qty 1

## 2012-02-10 MED ORDER — SODIUM CHLORIDE 0.9 % IV BOLUS (SEPSIS)
1000.0000 mL | Freq: Once | INTRAVENOUS | Status: AC
  Administered 2012-02-10: 1000 mL via INTRAVENOUS

## 2012-02-10 MED ORDER — DEXAMETHASONE SODIUM PHOSPHATE 10 MG/ML IJ SOLN
10.0000 mg | Freq: Once | INTRAMUSCULAR | Status: AC
  Administered 2012-02-10: 10 mg via INTRAVENOUS
  Filled 2012-02-10: qty 1

## 2012-02-10 MED ORDER — DIPHENHYDRAMINE HCL 50 MG/ML IJ SOLN
25.0000 mg | Freq: Once | INTRAMUSCULAR | Status: AC
  Administered 2012-02-10: 25 mg via INTRAVENOUS
  Filled 2012-02-10: qty 1

## 2012-02-10 NOTE — ED Provider Notes (Signed)
Received sign out from Fayrene Helper, PA-C.  Patient to move to CDU holding for migraine cocktail and discharge home when pain relieved.  Pt reported to have headache with sensitivity to light and sound, N/V, with no focal neurological deficits.  Per Greta Doom, he has discussed plan with Dr Juleen China who agrees.    Upon arrival to CDU, care to be taken over by Ivar Drape, PA-C.  Upon reviewing Bowie's note, it appears we are also to check and second troponin and reassess her chest pain.  I spoke with Dr Bernette Mayers who also received report from [facility].  Plan per Dr Bernette Mayers it second troponin to be ordered now.    Rob [place], PA-C, to assume care in CDU.  Will reassess for headache and chest pain.   [place], [place] 02/10/12 1706

## 2012-02-10 NOTE — ED Notes (Signed)
Report called to Lawson Fiscal RN in CDU moved to CDU #2

## 2012-02-10 NOTE — ED Notes (Signed)
Waiting on Phenergan from Pharmacy

## 2012-02-10 NOTE — ED Provider Notes (Signed)
5:31 PM  This patient is being held in the CDU and receiving treatment for migraine headache. This is a shared visit with Dr. Bernette Mayers. Patient was originally seen by Fayrene Helper PA-C, who tells the CDU that she is to be observed and treated for migraine headache and then discharged to home. However, he remarks in his note the patient will require further chest pain workup with troponins.  S: The patient tells me that her headache has subsided. She states that she feels dehydrated and is concerned about not being able to keep food down. She tells me the last time that she had chest pain was 2 months ago after she tried taking Zocor for high cholesterol. She reported heart palpitations at that time, and since discontinuing the medication does not experience chest pain. She states that last night while at a car show and she had the onset of headache and nausea. She states that this is new for her. She also states associated weakness, which she feels is related to lack of eating.  O: A&Ox4, Left eye lags behind right which is normal for her, RRR, no m/r/g, CTAB, no w/r/r, abdomen is soft, she is tender to palpation in the right and left lower quadrant where she has a known hernia, moves all extremities with sensation and strength intact, neurovascularly intact, no peripheral edema, good distal pulses.  A: Migraine  P:  Migraine cocktail, repeat troponin,  6:23 PM Patient states that her headache is under control. Patient feels slightly "hyper," which she believes is due to the Benadryl. Patient states that she would like to go home this evening. She says that she feels that she is ready. Will try regular diet.  Awaiting repeat troponin.   7:38 PM   Results for orders placed during the hospital encounter of 02/10/12  CBC      Component Value Range   WBC 10.7 (*) 4.0 - 10.5 K/uL   RBC 4.63  3.87 - 5.11 MIL/uL   Hemoglobin 13.5  12.0 - 15.0 g/dL   HCT 62.1  30.8 - 65.7 %   MCV 85.7  78.0 - 100.0 fL     MCH 29.2  26.0 - 34.0 pg   MCHC 34.0  30.0 - 36.0 g/dL   RDW 84.6  96.2 - 95.2 %   Platelets 231  150 - 400 K/uL  BASIC METABOLIC PANEL      Component Value Range   Sodium 140  135 - 145 mEq/L   Potassium 3.8  3.5 - 5.1 mEq/L   Chloride 103  96 - 112 mEq/L   CO2 26  19 - 32 mEq/L   Glucose, Bld 95  70 - 99 mg/dL   BUN 11  6 - 23 mg/dL   Creatinine, Ser 8.41  0.50 - 1.10 mg/dL   Calcium 9.6  8.4 - 32.4 mg/dL   GFR calc non Af Amer >90  >90 mL/min   GFR calc Af Amer >90  >90 mL/min  URINALYSIS, ROUTINE W REFLEX MICROSCOPIC      Component Value Range   Color, Urine YELLOW  YELLOW   APPearance CLEAR  CLEAR   Specific Gravity, Urine 1.022  1.005 - 1.030   pH 5.5  5.0 - 8.0   Glucose, UA NEGATIVE  NEGATIVE mg/dL   Hgb urine dipstick NEGATIVE  NEGATIVE   Bilirubin Urine NEGATIVE  NEGATIVE   Ketones, ur NEGATIVE  NEGATIVE mg/dL   Protein, ur NEGATIVE  NEGATIVE mg/dL   Urobilinogen, UA 0.2  0.0 - 1.0 mg/dL   Nitrite NEGATIVE  NEGATIVE   Leukocytes, UA NEGATIVE  NEGATIVE  POCT I-STAT TROPONIN I      Component Value Range   Troponin i, poc 0.00  0.00 - 0.08 ng/mL   Comment 3           POCT I-STAT TROPONIN I      Component Value Range   Troponin i, poc 0.00  0.00 - 0.08 ng/mL   Comment 3            Dg Chest 2 View  02/10/2012  *RADIOLOGY REPORT*  Clinical Data:  Hypertension, chest pain  CHEST - 2 VIEW  Comparison: Most recent prior chest x-ray 05/23/2004  Findings: The lungs are well-aerated and free from pulmonary edema, focal airspace consolidation or pulmonary nodule.  Cardiac and mediastinal contours are within normal limits.  No pneumothorax, or pleural effusion. No acute osseous findings.  IMPRESSION:  No acute cardiopulmonary disease.   Original Report Authenticated By: HEATH    Migraine appears to have resolved. Troponin remains negative. Will discharge patient to home with followup with her primary care physician in 3-5 days. Prior to discharge will give patient her daily  dose of metoprolol. Patient instructed to return if she experiences any further chest pain, shortness of breath, weakness, numbness or tingling.    Roxy Horseman, PA-C 02/10/12 1948

## 2012-02-10 NOTE — ED Provider Notes (Signed)
Medical screening examination/treatment/procedure(s) were performed by non-physician practitioner and as supervising physician I was immediately available for consultation/collaboration.  Charles B. Sheldon, MD 02/10/12 2256 

## 2012-02-10 NOTE — ED Notes (Signed)
Patient had several episodes of nausea and vomiting prior to medications being given. PA changed Phenergan order. To Zofran

## 2012-02-10 NOTE — ED Notes (Signed)
Pt complains of htn and chest pain, sts feels like all of her body is off since this am, has had a lot of family and external stressors recently.

## 2012-02-10 NOTE — ED Notes (Signed)
Patient advised that the headache started started today at approximately 09:00am. Nausea and vomiting today times 3. She stated that she was at a car race until 5 am up all night.

## 2012-02-10 NOTE — ED Provider Notes (Signed)
History     CSN: [id number]  Arrival date & time 02/10/12  1240   First MD Initiated Contact with Patient 02/10/12 1340      Chief Complaint  Patient presents with  . Hypertension  . Chest Pain    (Consider location/radiation/quality/duration/timing/severity/associated sxs/prior treatment) HPI  46 year old female with history of hypertension presents with multiple complaints.  She of hypertension and she has been exercising more than usual to help low blood pressure and reduce the weight. Yesterday patient went to a car race and did not wear her hearing protection.  She also stay out late and ate "alot of different food".  This AM she was trying to sleep but she reports and throbbing headache across her forehead that was constant.  Headache onset was gradual, persistent, with light and sound sensitivity.  She also felt nauseated and vomited a few times.  She also experienced a dull sensation to her L side of chest that lasted less than 5 min and improves once she vomited.  She decided to come to a nearby firestation to have her blood pressure checked and it was >200 systolic.  She decided to come to ER for further care.  Pt is not a smoker but does have family hx of heart disease and stroke.  Pt denies numbness or weakness, fever, chill, cough, abd pain, or urinary complaint.  Currently her headache is what bothers her the most.  Denies neck stiffness.    Past Medical History  Diagnosis Date  . Hypertension     Past Surgical History  Procedure Date  . Hernia repair     No family history on file.  History  Substance Use Topics  . Smoking status: Never Smoker   . Smokeless tobacco: Not on file  . Alcohol Use: No    OB History    Grav Para Term Preterm Abortions TAB SAB Ect Mult Living                  Review of Systems  All other systems reviewed and are negative.    Allergies  Codeine; Penicillins; and Sulfa drugs cross reactors  Home Medications   Current  Outpatient Rx  Name Route Sig Dispense Refill  . METOPROLOL SUCCINATE ER 100 MG PO TB24 Oral Take 100 mg by mouth daily. Take with or immediately following a meal.      BP 157/96  Pulse 78  Temp 97.9 F (36.6 C) (Oral)  Resp 18  SpO2 96%  Physical Exam  Nursing note and vitals reviewed. Constitutional: She is oriented to person, place, and time. She appears well-developed and well-nourished. No distress.       Awake, alert, nontoxic appearance.  Has white towel to cover her eyes.    HENT:  Head: Atraumatic.  Right Ear: External ear normal.  Left Ear: External ear normal.  Mouth/Throat: Oropharynx is clear and moist. No oropharyngeal exudate.  Eyes: Conjunctivae normal and EOM are normal. Pupils are equal, round, and reactive to light. Right eye exhibits no discharge. Left eye exhibits no discharge.  Neck: Normal range of motion. Neck supple. No Brudzinski's sign and no Kernig's sign noted.  Cardiovascular: Normal rate and regular rhythm.  Exam reveals no gallop and no friction rub.   No murmur heard. Pulmonary/Chest: Effort normal. No respiratory distress. She has no wheezes. She has no rales. She exhibits no tenderness.  Abdominal: Soft. There is no tenderness. There is no rebound.  Musculoskeletal: She exhibits no edema and  no tenderness.       ROM appears intact, no obvious focal weakness  Lymphadenopathy:    She has no cervical adenopathy.  Neurological: She is alert and oriented to person, place, and time. She displays normal reflexes. No cranial nerve deficit. She exhibits normal muscle tone. Coordination normal.       Mental status and motor strength appears intact  Skin: Skin is warm. No rash noted.  Psychiatric: She has a normal mood and affect.    ED Course  Procedures (including critical care time)   Labs Reviewed  CBC  BASIC METABOLIC PANEL  URINALYSIS, ROUTINE W REFLEX MICROSCOPIC   No results found.   No diagnosis found.   Date: 02/10/2012  Rate: 78   Rhythm: normal sinus rhythm  QRS Axis: normal  Intervals: normal  ST/T Wave abnormalities: normal  Conduction Disutrbances: none  Narrative Interpretation:   Old EKG Reviewed: No significant changes noted   Results for orders placed during the hospital encounter of 02/10/12  CBC      Component Value Range   WBC 10.7 (*) 4.0 - 10.5 K/uL   RBC 4.63  3.87 - 5.11 MIL/uL   Hemoglobin 13.5  12.0 - 15.0 g/dL   HCT 16.1  09.6 - 04.5 %   MCV 85.7  78.0 - 100.0 fL   MCH 29.2  26.0 - 34.0 pg   MCHC 34.0  30.0 - 36.0 g/dL   RDW 40.9  81.1 - 91.4 %   Platelets 231  150 - 400 K/uL  BASIC METABOLIC PANEL      Component Value Range   Sodium 140  135 - 145 mEq/L   Potassium 3.8  3.5 - 5.1 mEq/L   Chloride 103  96 - 112 mEq/L   CO2 26  19 - 32 mEq/L   Glucose, Bld 95  70 - 99 mg/dL   BUN 11  6 - 23 mg/dL   Creatinine, Ser 7.82  0.50 - 1.10 mg/dL   Calcium 9.6  8.4 - 95.6 mg/dL   GFR calc non Af Amer >90  >90 mL/min   GFR calc Af Amer >90  >90 mL/min  URINALYSIS, ROUTINE W REFLEX MICROSCOPIC      Component Value Range   Color, Urine YELLOW  YELLOW   APPearance CLEAR  CLEAR   Specific Gravity, Urine 1.022  1.005 - 1.030   pH 5.5  5.0 - 8.0   Glucose, UA NEGATIVE  NEGATIVE mg/dL   Hgb urine dipstick NEGATIVE  NEGATIVE   Bilirubin Urine NEGATIVE  NEGATIVE   Ketones, ur NEGATIVE  NEGATIVE mg/dL   Protein, ur NEGATIVE  NEGATIVE mg/dL   Urobilinogen, UA 0.2  0.0 - 1.0 mg/dL   Nitrite NEGATIVE  NEGATIVE   Leukocytes, UA NEGATIVE  NEGATIVE  POCT I-STAT TROPONIN I      Component Value Range   Troponin i, poc 0.00  0.00 - 0.08 ng/mL   Comment 3            Dg Chest 2 View  02/10/2012  *RADIOLOGY REPORT*  Clinical Data:  Hypertension, chest pain  CHEST - 2 VIEW  Comparison: Most recent prior chest x-ray 05/23/2004  Findings: The lungs are well-aerated and free from pulmonary edema, focal airspace consolidation or pulmonary nodule.  Cardiac and mediastinal contours are within normal limits.  No  pneumothorax, or pleural effusion. No acute osseous findings.  IMPRESSION:  No acute cardiopulmonary disease.   Original Report Authenticated By: Vilma Prader  1. Headache 2. Chest pain  MDM  Pt with mult. Complaint but primarily headache and high blood pressure.  Her BP is elevated today with 150s systolic. Headache is frontal, with phonophobia/photophobia.  No nuchal rigidity or focal neuro deficits.  Pt's sxs is suggestive of lack of sleep and overexertion, and stress which has been accumulating.  Her ECG is normal.  She is afebrile, She has no active CP, and her CP complaint is atypical for ACS.  Therefore, i will give migraine cocktail as treatment and will continue to monitor.  Discussed with my attending.     3:55 PM Pt to move to CDU for further management of her headache.   Pt will need delta troponin, and reevaluation of her chest pain. Dr. Bernette Mayers is aware of plan.  Trixie Dredge, PA-C will continue care.   BP 157/96  Pulse 78  Temp 97.9 F (36.6 C) (Oral)  Resp 18  SpO2 96%  Nursing notes reviewed and considered in documentation  Previous records reviewed and considered  All labs/vitals reviewed and considered  xrays reviewed and considered     Fayrene Helper, PA-C 02/10/12 1556

## 2012-02-11 NOTE — ED Provider Notes (Signed)
Medical screening examination/treatment/procedure(s) were performed by non-physician practitioner and as supervising physician I was immediately available for consultation/collaboration.   Ahonesty Woodfin, MD 02/11/12 0946 

## 2012-02-11 NOTE — ED Provider Notes (Signed)
Medical screening examination/treatment/procedure(s) were performed by non-physician practitioner and as supervising physician I was immediately available for consultation/collaboration.   Malaki Koury, MD 02/11/12 0946 

## 2013-03-03 ENCOUNTER — Emergency Department (HOSPITAL_COMMUNITY): Payer: Self-pay

## 2013-03-03 ENCOUNTER — Emergency Department (HOSPITAL_COMMUNITY)
Admission: EM | Admit: 2013-03-03 | Discharge: 2013-03-03 | Disposition: A | Discharge status: Home / Self Care | Payer: Self-pay | Attending: Emergency Medicine | Admitting: Emergency Medicine

## 2013-03-03 ENCOUNTER — Encounter (HOSPITAL_COMMUNITY): Payer: Self-pay | Admitting: Emergency Medicine

## 2013-03-03 DIAGNOSIS — K5730 Diverticulosis of large intestine without perforation or abscess without bleeding: Secondary | ICD-10-CM | POA: Insufficient documentation

## 2013-03-03 DIAGNOSIS — I10 Essential (primary) hypertension: Secondary | ICD-10-CM | POA: Insufficient documentation

## 2013-03-03 DIAGNOSIS — J45909 Unspecified asthma, uncomplicated: Secondary | ICD-10-CM | POA: Insufficient documentation

## 2013-03-03 DIAGNOSIS — Z79899 Other long term (current) drug therapy: Secondary | ICD-10-CM | POA: Insufficient documentation

## 2013-03-03 DIAGNOSIS — Z3202 Encounter for pregnancy test, result negative: Secondary | ICD-10-CM | POA: Insufficient documentation

## 2013-03-03 DIAGNOSIS — K458 Other specified abdominal hernia without obstruction or gangrene: Secondary | ICD-10-CM | POA: Insufficient documentation

## 2013-03-03 DIAGNOSIS — R109 Unspecified abdominal pain: Secondary | ICD-10-CM | POA: Insufficient documentation

## 2013-03-03 DIAGNOSIS — Z7982 Long term (current) use of aspirin: Secondary | ICD-10-CM | POA: Insufficient documentation

## 2013-03-03 DIAGNOSIS — N83209 Unspecified ovarian cyst, unspecified side: Secondary | ICD-10-CM | POA: Insufficient documentation

## 2013-03-03 DIAGNOSIS — Z882 Allergy status to sulfonamides status: Secondary | ICD-10-CM | POA: Insufficient documentation

## 2013-03-03 DIAGNOSIS — Z885 Allergy status to narcotic agent status: Secondary | ICD-10-CM | POA: Insufficient documentation

## 2013-03-03 DIAGNOSIS — Z880 Allergy status to penicillin: Secondary | ICD-10-CM | POA: Insufficient documentation

## 2013-03-03 HISTORY — DX: Unspecified abdominal hernia without obstruction or gangrene: K46.9

## 2013-03-03 HISTORY — DX: Unspecified asthma, uncomplicated: J45.909

## 2013-03-03 LAB — CBC WITH DIFFERENTIAL/PLATELET
Basophils Absolute: 0.1 10*3/uL (ref 0.0–0.1)
Basophils Relative: 1 % (ref 0–1)
Eosinophils Absolute: 0.2 10*3/uL (ref 0.0–0.7)
Eosinophils Relative: 2 % (ref 0–5)
HCT: 42 % (ref 36.0–46.0)
Hemoglobin: 13.8 g/dL (ref 12.0–15.0)
Lymphocytes Relative: 27 % (ref 12–46)
Lymphs Abs: 2.1 10*3/uL (ref 0.7–4.0)
MCH: 28 pg (ref 26.0–34.0)
MCHC: 32.9 g/dL (ref 30.0–36.0)
MCV: 85.2 fL (ref 78.0–100.0)
Monocytes Absolute: 0.4 10*3/uL (ref 0.1–1.0)
Monocytes Relative: 5 % (ref 3–12)
Neutro Abs: 5.1 10*3/uL (ref 1.7–7.7)
Neutrophils Relative %: 65 % (ref 43–77)
Platelets: 252 10*3/uL (ref 150–400)
RBC: 4.93 MIL/uL (ref 3.87–5.11)
RDW: 13.3 % (ref 11.5–15.5)
WBC: 7.8 10*3/uL (ref 4.0–10.5)

## 2013-03-03 LAB — COMPREHENSIVE METABOLIC PANEL
ALT: 11 U/L (ref 0–35)
AST: 12 U/L (ref 0–37)
Albumin: 3.6 g/dL (ref 3.5–5.2)
Alkaline Phosphatase: 76 U/L (ref 39–117)
BUN: 12 mg/dL (ref 6–23)
CO2: 26 mEq/L (ref 19–32)
Calcium: 9.4 mg/dL (ref 8.4–10.5)
Chloride: 102 mEq/L (ref 96–112)
Creatinine, Ser: 0.63 mg/dL (ref 0.50–1.10)
GFR calc Af Amer: >90 mL/min (ref >90)
GFR calc non Af Amer: >90 mL/min (ref >90)
Glucose, Bld: 87 mg/dL (ref 70–99)
Potassium: 3.9 mEq/L (ref 3.5–5.1)
Sodium: 139 mEq/L (ref 135–145)
Total Bilirubin: 0.1 mg/dL — ABNORMAL LOW (ref 0.3–1.2)
Total Protein: 7.3 g/dL (ref 6.0–8.3)

## 2013-03-03 LAB — URINALYSIS, ROUTINE W REFLEX MICROSCOPIC
Bilirubin Urine: NEGATIVE
Glucose, UA: NEGATIVE mg/dL
Hgb urine dipstick: NEGATIVE
Ketones, ur: NEGATIVE mg/dL
Leukocytes, UA: NEGATIVE
Nitrite: NEGATIVE
Protein, ur: NEGATIVE mg/dL
Specific Gravity, Urine: 1.023 (ref 1.005–1.030)
Urobilinogen, UA: 0.2 mg/dL (ref 0.0–1.0)
pH: 5 (ref 5.0–8.0)

## 2013-03-03 LAB — POCT PREGNANCY, URINE: Preg Test, Ur: NEGATIVE

## 2013-03-03 MED ORDER — HYDROMORPHONE HCL PF 1 MG/ML IJ SOLN
1.0000 mg | Freq: Once | INTRAMUSCULAR | Status: AC
  Administered 2013-03-03: 1 mg via INTRAVENOUS
  Filled 2013-03-03: qty 1

## 2013-03-03 MED ORDER — ONDANSETRON HCL 4 MG/2ML IJ SOLN
4.0000 mg | Freq: Once | INTRAMUSCULAR | Status: AC
  Administered 2013-03-03: 4 mg via INTRAVENOUS
  Filled 2013-03-03: qty 2

## 2013-03-03 MED ORDER — IOHEXOL 300 MG/ML  SOLN
25.0000 mL | INTRAMUSCULAR | Status: AC
  Administered 2013-03-03: 25 mL via ORAL

## 2013-03-03 MED ORDER — SODIUM CHLORIDE 0.9 % IV BOLUS (SEPSIS)
1000.0000 mL | Freq: Once | INTRAVENOUS | Status: AC
  Administered 2013-03-03: 1000 mL via INTRAVENOUS

## 2013-03-03 MED ORDER — IOHEXOL 300 MG/ML  SOLN
100.0000 mL | Freq: Once | INTRAMUSCULAR | Status: AC | PRN
  Administered 2013-03-03: 100 mL via INTRAVENOUS

## 2013-03-03 NOTE — ED Notes (Signed)
Discharge instructions reviewed with pt. Pt verbalized understanding.   

## 2013-03-03 NOTE — ED Notes (Signed)
Ct notified that pt has finished contrast 

## 2013-03-03 NOTE — ED Provider Notes (Addendum)
CSN: [id number]     Arrival date & time 03/03/13  1203 History   First MD Initiated Contact with Patient 03/03/13 1502     Chief Complaint  Patient presents with  . Abdominal Pain   (Consider location/radiation/quality/duration/timing/severity/associated sxs/prior Treatment) HPI 47 y.o. femlae with abdominal pain began about 5 hours pts afterlarge bowel movement.  Pain is burning and stinging in the right side of abdomen beside umbilicus.  Pain was severe at 8/10 for about an hour.  Pain has decreased to 5/10 states she is still sore.  Patient states she an umbilical or incisional hernia several years ago but was unable to follow up.  States she has some anterior abdominal bulging which has gotten worse over summer this year.  Denies nausea, vomiting,  She ate breakfast today and had some tea.  Denies blood in stool or dark tarry stools.  Patient has had appendectomy,  Past Medical History  Diagnosis Date  . Hypertension   . Hernia    Past Surgical History  Procedure Laterality Date  . Hernia repair    . Cesarean section    . Myomectomy    . Abdominal hysterectomy     History reviewed. No pertinent family history. History  Substance Use Topics  . Smoking status: Never Smoker   . Smokeless tobacco: Not on file  . Alcohol Use: No   OB History   Grav Para Term Preterm Abortions TAB SAB Ect Mult Living                 Review of Systems  All other systems reviewed and are negative.    Allergies  Codeine; Penicillins; and Sulfa drugs cross reactors  Home Medications   Current Outpatient Rx  Name  Route  Sig  Dispense  Refill  . aspirin EC 81 MG tablet   Oral   Take 81 mg by mouth daily.         . hydrochlorothiazide (HYDRODIURIL) 25 MG tablet   Oral   Take 25 mg by mouth daily.         . metoprolol succinate (TOPROL-XL) 100 MG 24 hr tablet   Oral   Take 100 mg by mouth at bedtime. Take with or immediately following a meal.         .  neomycin-bacitracin-polymyxin (NEOSPORIN) ointment   Topical   Apply 1 application topically daily as needed (to cuts). apply to eye          BP 173/95  Pulse 63  Temp(Src) 98.7 F (37.1 C) (Oral)  Resp 18  Wt 216 lb 14.4 oz (98.385 kg)  SpO2 98% Physical Exam  Nursing note and vitals reviewed. Constitutional: She is oriented to person, place, and time. She appears well-developed and well-nourished.  HENT:  Head: Normocephalic and atraumatic.  Right Ear: External ear normal.  Left Ear: External ear normal.  Nose: Nose normal.  Mouth/Throat: Oropharynx is clear and moist.  Eyes: Conjunctivae and EOM are normal. Pupils are equal, round, and reactive to light.  Neck: Normal range of motion. Neck supple.  Cardiovascular: Normal rate, regular rhythm, normal heart sounds and intact distal pulses.   Pulmonary/Chest: Effort normal and breath sounds normal.  Abdominal: Soft. Bowel sounds are normal.  Musculoskeletal: Normal range of motion.  Neurological: She is alert and oriented to person, place, and time. She has normal reflexes.  Skin: Skin is warm and dry.  Psychiatric: She has a normal mood and affect. Her behavior is normal. Thought content normal.  ED Course  Procedures (including critical care time) Labs Review Labs Reviewed  COMPREHENSIVE METABOLIC PANEL - Abnormal; Notable for the following:    Total Bilirubin 0.1 (*)    All other components within normal limits  CBC WITH DIFFERENTIAL  URINALYSIS, ROUTINE W REFLEX MICROSCOPIC  POCT PREGNANCY, URINE   Imaging Review No results found.  EKG Interpretation   None       MDM   Results for orders placed during the hospital encounter of 03/03/13  CBC WITH DIFFERENTIAL      Result Value Range   WBC 7.8  4.0 - 10.5 K/uL   RBC 4.93  3.87 - 5.11 MIL/uL   Hemoglobin 13.8  12.0 - 15.0 g/dL   HCT 30.8  65.7 - 84.6 %   MCV 85.2  78.0 - 100.0 fL   MCH 28.0  26.0 - 34.0 pg   MCHC 32.9  30.0 - 36.0 g/dL   RDW 96.2   95.2 - 84.1 %   Platelets 252  150 - 400 K/uL   Neutrophils Relative % 65  43 - 77 %   Neutro Abs 5.1  1.7 - 7.7 K/uL   Lymphocytes Relative 27  12 - 46 %   Lymphs Abs 2.1  0.7 - 4.0 K/uL   Monocytes Relative 5  3 - 12 %   Monocytes Absolute 0.4  0.1 - 1.0 K/uL   Eosinophils Relative 2  0 - 5 %   Eosinophils Absolute 0.2  0.0 - 0.7 K/uL   Basophils Relative 1  0 - 1 %   Basophils Absolute 0.1  0.0 - 0.1 K/uL  COMPREHENSIVE METABOLIC PANEL      Result Value Range   Sodium 139  135 - 145 mEq/L   Potassium 3.9  3.5 - 5.1 mEq/L   Chloride 102  96 - 112 mEq/L   CO2 26  19 - 32 mEq/L   Glucose, Bld 87  70 - 99 mg/dL   BUN 12  6 - 23 mg/dL   Creatinine, Ser 3.24  0.50 - 1.10 mg/dL   Calcium 9.4  8.4 - 40.1 mg/dL   Total Protein 7.3  6.0 - 8.3 g/dL   Albumin 3.6  3.5 - 5.2 g/dL   AST 12  0 - 37 U/L   ALT 11  0 - 35 U/L   Alkaline Phosphatase 76  39 - 117 U/L   Total Bilirubin 0.1 (*) 0.3 - 1.2 mg/dL   GFR calc non Af Amer >90  >90 mL/min   GFR calc Af Amer >90  >90 mL/min  URINALYSIS, ROUTINE W REFLEX MICROSCOPIC      Result Value Range   Color, Urine YELLOW  YELLOW   APPearance CLEAR  CLEAR   Specific Gravity, Urine 1.023  1.005 - 1.030   pH 5.0  5.0 - 8.0   Glucose, UA NEGATIVE  NEGATIVE mg/dL   Hgb urine dipstick NEGATIVE  NEGATIVE   Bilirubin Urine NEGATIVE  NEGATIVE   Ketones, ur NEGATIVE  NEGATIVE mg/dL   Protein, ur NEGATIVE  NEGATIVE mg/dL   Urobilinogen, UA 0.2  0.0 - 1.0 mg/dL   Nitrite NEGATIVE  NEGATIVE   Leukocytes, UA NEGATIVE  NEGATIVE  POCT PREGNANCY, URINE      Result Value Range   Preg Test, Ur NEGATIVE  NEGATIVE   No information on file. Ct Abdomen Pelvis W Contrast  03/03/2013   CLINICAL DATA:  Abdominal pain. Hernia.  Diverticulosis.  EXAM: CT ABDOMEN AND PELVIS WITH  CONTRAST  TECHNIQUE: Multidetector CT imaging of the abdomen and pelvis was performed using the standard protocol following bolus administration of intravenous contrast.  CONTRAST:   OMNIPAQUE IOHEXOL 300 MG/ML  SOLN  COMPARISON:  02/04/2009  FINDINGS: A tiny 1 cm hypervascular lesion is again seen in the dome of the liver on image 13 which is stable and consistent with benign etiology. No other liver masses are identified. Gallbladder is unremarkable. The pancreas, spleen, adrenal glands, and kidneys are normal in appearance. Prior hysterectomy again noted. A benign appearing cyst is seen in the left adnexa which measures 2.8 x 3.7 cm. No other pelvic masses are identified. No adenopathy identified within the abdomen or pelvis.  No evidence of inflammatory process. Diverticulosis again seen predominately involving the left colon, however there is no evidence of diverticulitis. Two suprapubic anterior abdominal wall hernias are again seen both containing only fat. No evidence of herniated bowel loops.  IMPRESSION: No acute findings.  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  3.7 cm benign appearing left ovarian cyst.  Small suprapubic anterior abdominal wall hernias containing only fat. No evidence of herniated bowel.  Diverticulosis. No radiographic evidence of diverticulitis.   Electronically Signed   By: Myles Rosenthal M.D.   On: 03/03/2013 17:56   I have reviewed the report and personally reviewed the above radiology studies.    Patient with no evidence of acute intraabdominal process causing pain.  Abdominal wall hernias containing fat noted.  Hypervascular lesion of dome of liver noted and patient advised.  Patient advised of return precautions and need for follow up.    Hilario Quarry, MD 03/03/13 9147  Hilario Quarry, MD 03/26/13 2020

## 2013-03-03 NOTE — ED Notes (Signed)
Pt c/o lower rt abd pain. Pt states she has hernias on both sides but has been having some increasing pain over the last month or so. Pt states she was walking and had a sharp pain to RLQ, also experiencing some constipation and pain with bowel movements. Pt denies any n/v/d. Pt rates pain 7/10. Pt states she took aspirin for pain.

## 2013-03-03 NOTE — ED Notes (Signed)
Pt c/o sudden onset of pain to the RLQ pain just lateral to her umbilici, pt reports she was dx with an incisional hernia and is supposed to have surgery but has put it off due to taking care of her mother. Pt reports her pain started 1 hour ago but has a dry cough for several days and noticed a "pulling" sensation to the area.

## 2014-01-03 ENCOUNTER — Emergency Department (HOSPITAL_COMMUNITY)
Admission: EM | Admit: 2014-01-03 | Discharge: 2014-01-03 | Disposition: A | Discharge status: Home / Self Care | Payer: Self-pay | Attending: Emergency Medicine | Admitting: Emergency Medicine

## 2014-01-03 ENCOUNTER — Emergency Department (HOSPITAL_COMMUNITY): Payer: Self-pay

## 2014-01-03 ENCOUNTER — Encounter (HOSPITAL_COMMUNITY): Payer: Self-pay | Admitting: Emergency Medicine

## 2014-01-03 DIAGNOSIS — N951 Menopausal and female climacteric states: Secondary | ICD-10-CM | POA: Insufficient documentation

## 2014-01-03 DIAGNOSIS — R519 Headache, unspecified: Secondary | ICD-10-CM

## 2014-01-03 DIAGNOSIS — R232 Flushing: Secondary | ICD-10-CM

## 2014-01-03 DIAGNOSIS — R51 Headache: Secondary | ICD-10-CM | POA: Insufficient documentation

## 2014-01-03 DIAGNOSIS — R197 Diarrhea, unspecified: Secondary | ICD-10-CM | POA: Insufficient documentation

## 2014-01-03 DIAGNOSIS — Z880 Allergy status to penicillin: Secondary | ICD-10-CM | POA: Insufficient documentation

## 2014-01-03 DIAGNOSIS — R1013 Epigastric pain: Secondary | ICD-10-CM | POA: Insufficient documentation

## 2014-01-03 DIAGNOSIS — R6883 Chills (without fever): Secondary | ICD-10-CM | POA: Insufficient documentation

## 2014-01-03 DIAGNOSIS — R112 Nausea with vomiting, unspecified: Secondary | ICD-10-CM | POA: Insufficient documentation

## 2014-01-03 DIAGNOSIS — R079 Chest pain, unspecified: Secondary | ICD-10-CM | POA: Insufficient documentation

## 2014-01-03 DIAGNOSIS — Z79899 Other long term (current) drug therapy: Secondary | ICD-10-CM | POA: Insufficient documentation

## 2014-01-03 DIAGNOSIS — J45909 Unspecified asthma, uncomplicated: Secondary | ICD-10-CM | POA: Insufficient documentation

## 2014-01-03 DIAGNOSIS — I10 Essential (primary) hypertension: Secondary | ICD-10-CM | POA: Insufficient documentation

## 2014-01-03 LAB — CBC
HCT: 41.7 % (ref 36.0–46.0)
Hemoglobin: 14 g/dL (ref 12.0–15.0)
MCH: 29.5 pg (ref 26.0–34.0)
MCHC: 33.6 g/dL (ref 30.0–36.0)
MCV: 87.8 fL (ref 78.0–100.0)
Platelets: 217 10*3/uL (ref 150–400)
RBC: 4.75 MIL/uL (ref 3.87–5.11)
RDW: 12.8 % (ref 11.5–15.5)
WBC: 8.7 10*3/uL (ref 4.0–10.5)

## 2014-01-03 LAB — BASIC METABOLIC PANEL
Anion gap: 15 (ref 5–15)
BUN: 9 mg/dL (ref 6–23)
CO2: 26 mEq/L (ref 19–32)
Calcium: 9.3 mg/dL (ref 8.4–10.5)
Chloride: 104 mEq/L (ref 96–112)
Creatinine, Ser: 0.64 mg/dL (ref 0.50–1.10)
GFR calc Af Amer: >90 mL/min (ref >90)
GFR calc non Af Amer: >90 mL/min (ref >90)
Glucose, Bld: 98 mg/dL (ref 70–99)
Potassium: 3.7 mEq/L (ref 3.7–5.3)
Sodium: 145 mEq/L (ref 137–147)

## 2014-01-03 LAB — HEPATIC FUNCTION PANEL
ALT: 13 U/L (ref 0–35)
AST: 15 U/L (ref 0–37)
Albumin: 3.6 g/dL (ref 3.5–5.2)
Alkaline Phosphatase: 75 U/L (ref 39–117)
Bilirubin, Direct: <0.2 mg/dL (ref 0.0–0.3)
Total Bilirubin: 0.3 mg/dL (ref 0.3–1.2)
Total Protein: 6.9 g/dL (ref 6.0–8.3)

## 2014-01-03 LAB — I-STAT TROPONIN, ED: Troponin i, poc: 0 ng/mL (ref 0.00–0.08)

## 2014-01-03 MED ORDER — SODIUM CHLORIDE 0.9 % IV BOLUS (SEPSIS)
1000.0000 mL | Freq: Once | INTRAVENOUS | Status: AC
  Administered 2014-01-03: 1000 mL via INTRAVENOUS

## 2014-01-03 MED ORDER — PROCHLORPERAZINE EDISYLATE 5 MG/ML IJ SOLN
10.0000 mg | Freq: Once | INTRAMUSCULAR | Status: AC
  Administered 2014-01-03: 10 mg via INTRAVENOUS
  Filled 2014-01-03: qty 2

## 2014-01-03 MED ORDER — DIPHENHYDRAMINE HCL 50 MG/ML IJ SOLN
25.0000 mg | Freq: Once | INTRAMUSCULAR | Status: AC
  Administered 2014-01-03: 25 mg via INTRAVENOUS
  Filled 2014-01-03: qty 1

## 2014-01-03 MED ORDER — KETOROLAC TROMETHAMINE 30 MG/ML IJ SOLN
30.0000 mg | Freq: Once | INTRAMUSCULAR | Status: AC
  Administered 2014-01-03: 30 mg via INTRAVENOUS
  Filled 2014-01-03: qty 1

## 2014-01-03 NOTE — ED Provider Notes (Signed)
Medical screening examination/treatment/procedure(s) were performed by non-physician practitioner and as supervising physician I was immediately available for consultation/collaboration.   EKG Interpretation   Date/Time:  Saturday January 03 2014 15:06:05 EDT Ventricular Rate:  57 PR Interval:  164 QRS Duration: 76 QT Interval:  438 QTC Calculation: 426 R Axis:   68 Text Interpretation:  Sinus bradycardia Otherwise normal ECG Since last  tracing rate slower Confirmed by [place]  MD, Mairim Bade ([id number]) on 01/03/2014  11:05:45 PM        Rolan Bucco, MD 01/03/14 2305

## 2014-01-03 NOTE — ED Provider Notes (Signed)
CSN: [id number]     Arrival date & time 01/03/14  1459 History   First MD Initiated Contact with Patient 01/03/14 1655     Chief Complaint  Patient presents with  . Headache  . Chest Pain     (Consider location/radiation/quality/duration/timing/severity/associated sxs/prior Treatment) HPI Pt is a 48yo female with hx of HTN, abdominal hernia and asthma presenting to ED with c/o right sided throbbing headache that started earlier this afternoon while working outside in the heat earlier this morning.  Pt states she had associated chest pain that has subsided as well as nausea and 2 episodes of emesis and 2 episodes of non-bloody diarrhea today.  Pt states headache is constant, throbbing, 8/10 at worse. Reports hot and cold chills, pt is unsure if she is having menopausal symptoms. Denies change in vision or balance. Denies chest pain or SOB at this time. Denies fever.  Does report hx of similar symptoms of chest pain and nausea and vomiting about 1 month ago.  States last night she remembers eating something spicy at that time and reports eating pizza earlier today before she started to feel bag.  Pt is concerned she may be having symptoms due to her gallbladder but denies previous hx of gallstones.    Past Medical History  Diagnosis Date  . Hypertension   . Hernia   . Asthma    Past Surgical History  Procedure Laterality Date  . Hernia repair    . Cesarean section    . Myomectomy    . Abdominal hysterectomy     History reviewed. No pertinent family history. History  Substance Use Topics  . Smoking status: Never Smoker   . Smokeless tobacco: Not on file  . Alcohol Use: No   OB History   Grav Para Term Preterm Abortions TAB SAB Ect Mult Living                 Review of Systems  Constitutional: Positive for chills. Negative for fever.  Eyes: Negative for photophobia and visual disturbance.  Respiratory: Negative for cough and shortness of breath.   Cardiovascular: Positive for  chest pain. Negative for palpitations and leg swelling.  Gastrointestinal: Positive for nausea, vomiting, abdominal pain ( upper abdomen) and diarrhea. Negative for constipation.  Neurological: Positive for headaches. Negative for dizziness and light-headedness.  All other systems reviewed and are negative.     Allergies  Codeine; Penicillins; and Sulfa drugs cross reactors  Home Medications   Prior to Admission medications   Medication Sig Start Date End Date Taking? Authorizing Provider  metoprolol succinate (TOPROL-XL) 100 MG 24 hr tablet Take 100 mg by mouth at bedtime. Take with or immediately following a meal.   Yes Historical Provider, MD  neomycin-bacitracin-polymyxin (NEOSPORIN) ointment Apply 1 application topically daily as needed (to cuts). apply to eye   Yes Historical Provider, MD   BP 141/80  Pulse 62  Temp(Src) 98.6 F (37 C) (Oral)  Resp 15  Ht 5\' 5"  (1.651 m)  Wt 220 lb (99.791 kg)  BMI 36.61 kg/m2  SpO2 97% Physical Exam  Nursing note and vitals reviewed. Constitutional: She is oriented to person, place, and time. She appears well-developed and well-nourished. No distress.  Pt lying comfortably in exam bed, NAD.    HENT:  Head: Normocephalic and atraumatic.  Eyes: Conjunctivae and EOM are normal. Pupils are equal, round, and reactive to light. No scleral icterus.  Neck: Normal range of motion. Neck supple.  Cardiovascular: Normal rate, regular rhythm  and normal heart sounds.   Regular rate and rhythm  Pulmonary/Chest: Effort normal and breath sounds normal. No respiratory distress. She has no wheezes. She has no rales. She exhibits no tenderness.  No respiratory distress, able to speak in full sentences w/o difficulty. Lungs: CTAB  Abdominal: Soft. Bowel sounds are normal. She exhibits no distension and no mass. There is tenderness ( epigastrium, mild). There is no rebound, no guarding, no tenderness at McBurney's point and negative Murphy's sign.  Obese  abdomen, soft, mild tenderness in epigastrium. Abdominal hernia palpated in right lower abdomen, soft, nontender, easily reducible.   Musculoskeletal: Normal range of motion.  Neurological: She is alert and oriented to person, place, and time. No cranial nerve deficit. Coordination normal.  Skin: Skin is warm and dry. She is not diaphoretic.    ED Course  Procedures (including critical care time) Labs Review Labs Reviewed  CBC  BASIC METABOLIC PANEL  HEPATIC FUNCTION PANEL  I-STAT TROPOININ, ED    Imaging Review Dg Chest 2 View  01/03/2014   CLINICAL DATA:  Midsternal chest pain and productive cough with nausea  EXAM: CHEST  2 VIEW  COMPARISON:  PA and lateral chest of February 10, 2012  FINDINGS: The lungs are adequately inflated and exhibits stable coarse lung markings in the right infrahilar region posteriorly. The heart and pulmonary vascularity are normal. There is no pleural effusion. There is mild tortuosity of the descending thoracic aorta. The bony thorax is unremarkable.  IMPRESSION: No active cardiopulmonary disease.   Electronically Signed   By: David  [place]   On: 01/03/2014 19:51     EKG Interpretation None      MDM   Final diagnoses:  Acute nonintractable headache, unspecified headache type  Hot flashes  Chest pain, unspecified chest pain type  Nausea vomiting and diarrhea    Pt is a 48yo female presenting to ED with c/o right sided headache with chest pain, n/v/d that started earlier today while working outside in the heat.  CP resolved prior to arrival to ED.  Pt is low risk for major cardiac event based off HEART score.  PERC negative. Pt also concerned with gallbladder even though no previous hx of gallstones or problems. Lab: unremarkable. CXR: unremarkable.  Pt given IV fluids, compazine, benadryl and toradol which helped improve headache from 8/10 to 3/10.  HA was gradual in onset.  Similar to previous. No hx of head trauma. Not concerned for [facility]. No focal  neuro deficits on exam. Not concerned for CVA. Not concerned for other emergent process taking place at this time.  Labs and imaging discussed with pt. Pt feels comfortable being discharged home to f/u with her PCP. Return precautions provided. Pt verbalized understanding and agreement with tx plan.   Junius Finnerrin O'Malley, PA-C 01/03/14 2303

## 2014-01-03 NOTE — ED Notes (Signed)
Pt to xray

## 2014-01-03 NOTE — ED Notes (Addendum)
She c/o headache, vomiting and chest pain onset this afternoon. She reports she was outside in the heat this morning then started to feel bad this afternoon. She reports the cp resolved but her head is still hurting. A&Ox4, breathing easily

## 2014-02-20 ENCOUNTER — Encounter (HOSPITAL_COMMUNITY): Payer: Self-pay | Admitting: Emergency Medicine

## 2014-02-20 ENCOUNTER — Emergency Department (INDEPENDENT_AMBULATORY_CARE_PROVIDER_SITE_OTHER)
Admission: EM | Admit: 2014-02-20 | Discharge: 2014-02-20 | Disposition: A | Discharge status: Home / Self Care | Payer: Self-pay | Source: Home / Self Care | Attending: Family Medicine | Admitting: Family Medicine

## 2014-02-20 DIAGNOSIS — J069 Acute upper respiratory infection, unspecified: Secondary | ICD-10-CM

## 2014-02-20 MED ORDER — AZITHROMYCIN 250 MG PO TABS: ORAL_TABLET | ORAL | Status: DC

## 2014-02-20 MED ORDER — METHYLPREDNISOLONE ACETATE 80 MG/ML IJ SUSP
INTRAMUSCULAR | Status: AC
  Filled 2014-02-20: qty 1

## 2014-02-20 MED ORDER — KETOROLAC TROMETHAMINE 30 MG/ML IJ SOLN
INTRAMUSCULAR | Status: AC
  Filled 2014-02-20: qty 1

## 2014-02-20 MED ORDER — METHYLPREDNISOLONE ACETATE 40 MG/ML IJ SUSP
80.0000 mg | Freq: Once | INTRAMUSCULAR | Status: AC
  Administered 2014-02-20: 80 mg via INTRAMUSCULAR

## 2014-02-20 MED ORDER — BENZONATATE 200 MG PO CAPS: 200.0000 mg | ORAL_CAPSULE | Freq: Three times a day (TID) | ORAL | Status: DC | PRN

## 2014-02-20 MED ORDER — KETOROLAC TROMETHAMINE 30 MG/ML IJ SOLN
30.0000 mg | Freq: Once | INTRAMUSCULAR | Status: AC
  Administered 2014-02-20: 30 mg via INTRAMUSCULAR

## 2014-02-20 NOTE — ED Notes (Signed)
Above IM meds given by this Clinical research associatewriter.

## 2014-02-20 NOTE — ED Notes (Signed)
Patient has had cough and sore throat for 2 days

## 2014-02-20 NOTE — Discharge Instructions (Signed)
Drink plenty of fluids as discussed, use medicine as prescribed,  Return or see your doctor if further problems °

## 2014-02-20 NOTE — ED Provider Notes (Signed)
CSN: [id number]     Arrival date & time 02/20/14  1019 History   First MD Initiated Contact with Patient 02/20/14 1102     Chief Complaint  Patient presents with  . Cough   (Consider location/radiation/quality/duration/timing/severity/associated sxs/prior Treatment) Patient is a 48 y.o. female presenting with cough. The history is provided by the patient.  Cough Cough characteristics:  Non-productive, dry and hacking Severity:  Mild Onset quality:  Gradual Duration:  2 days Progression:  Unchanged Chronicity:  New Smoker: no   Context: exposure to allergens and weather changes   Associated symptoms: rhinorrhea   Associated symptoms: no chills, no fever, no sore throat and no wheezing     Past Medical History  Diagnosis Date  . Hypertension   . Hernia   . Asthma    Past Surgical History  Procedure Laterality Date  . Hernia repair    . Cesarean section    . Myomectomy    . Abdominal hysterectomy     No family history on file. History  Substance Use Topics  . Smoking status: Never Smoker   . Smokeless tobacco: Not on file  . Alcohol Use: No   OB History   Grav Para Term Preterm Abortions TAB SAB Ect Mult Living                 Review of Systems  Constitutional: Negative.  Negative for fever and chills.  HENT: Positive for postnasal drip and rhinorrhea. Negative for sore throat.   Respiratory: Positive for cough. Negative for wheezing.   Cardiovascular: Negative.   Gastrointestinal: Negative.   Genitourinary: Negative.     Allergies  Codeine; Penicillins; and Sulfa drugs cross reactors  Home Medications   Prior to Admission medications   Medication Sig Start Date End Date Taking? Authorizing Provider  azithromycin (ZITHROMAX Z-PAK) 250 MG tablet Take as directed on pack 02/20/14   Linna HoffJames D Jaevian Shean, MD  benzonatate (TESSALON) 200 MG capsule Take 1 capsule (200 mg total) by mouth 3 (three) times daily as needed for cough. 02/20/14   Linna HoffJames D Zayda Angell, MD  metoprolol  succinate (TOPROL-XL) 100 MG 24 hr tablet Take 100 mg by mouth at bedtime. Take with or immediately following a meal.    Historical Provider, MD  neomycin-bacitracin-polymyxin (NEOSPORIN) ointment Apply 1 application topically daily as needed (to cuts). apply to eye    Historical Provider, MD   BP 141/95  Pulse 58  Temp(Src) 97.2 F (36.2 C) (Oral)  Resp 16  SpO2 96% Physical Exam  Nursing note and vitals reviewed. Constitutional: She is oriented to person, place, and time. She appears well-developed and well-nourished.  HENT:  Head: Normocephalic.  Right Ear: External ear normal.  Left Ear: External ear normal.  Nose: Mucosal edema and rhinorrhea present.  Mouth/Throat: Oropharynx is clear and moist.  Eyes: Conjunctivae are normal. Pupils are equal, round, and reactive to light.  Neck: Normal range of motion. Neck supple.  Cardiovascular: Normal heart sounds and intact distal pulses.   Pulmonary/Chest: Effort normal and breath sounds normal.  Lymphadenopathy:    She has no cervical adenopathy.  Neurological: She is alert and oriented to person, place, and time.  Skin: Skin is warm and dry.    ED Course  Procedures (including critical care time) Labs Review Labs Reviewed - No data to display  Imaging Review No results found.   MDM   1. URI (upper respiratory infection)        Linna HoffJames D Cassandra Harbold, MD 02/20/14 1126

## 2014-08-07 ENCOUNTER — Emergency Department (HOSPITAL_COMMUNITY)
Admission: EM | Admit: 2014-08-07 | Discharge: 2014-08-07 | Disposition: A | Discharge status: Home / Self Care | Payer: Self-pay | Source: Home / Self Care

## 2014-08-07 ENCOUNTER — Encounter (HOSPITAL_COMMUNITY): Payer: Self-pay | Admitting: Family Medicine

## 2014-08-07 DIAGNOSIS — K047 Periapical abscess without sinus: Secondary | ICD-10-CM

## 2014-08-07 DIAGNOSIS — J302 Other seasonal allergic rhinitis: Secondary | ICD-10-CM

## 2014-08-07 MED ORDER — FLUCONAZOLE 150 MG PO TABS: 150.0000 mg | ORAL_TABLET | Freq: Every day | ORAL | Status: DC

## 2014-08-07 MED ORDER — CLINDAMYCIN HCL 300 MG PO CAPS: 300.0000 mg | ORAL_CAPSULE | Freq: Three times a day (TID) | ORAL | Status: DC

## 2014-08-07 MED ORDER — FLUTICASONE PROPIONATE 50 MCG/ACT NA SUSP: 2.0000 | Freq: Every day | NASAL | Status: DC

## 2014-08-07 MED ORDER — IPRATROPIUM BROMIDE 0.06 % NA SOLN: 2.0000 | Freq: Four times a day (QID) | NASAL | Status: DC

## 2014-08-07 NOTE — ED Notes (Signed)
Patient c/o sinus congestion, chest congestion with productive cough, sore throat and ear pain x a few days. Patient reports she has taken Delsym with no relief. She did notice her sputum was yellow with a lil red in it. Patient also reports she has dental pain onset yesterday. Patient reports she cannot see her dentist until Monday.

## 2014-08-07 NOTE — ED Provider Notes (Signed)
CSN: [id number]     Arrival date & time 08/07/14  1032 History   None    Chief Complaint  Patient presents with  . URI  . Dental Pain   (Consider location/radiation/quality/duration/timing/severity/associated sxs/prior Treatment) HPI  Tooth pain: started 7 days ago. Scheduled ot seen dentist in 4 days..   Viral "cold" several months ago. Initially improving but then started worsening over past 2 days. Pt feels this is due to the increased pollen in the air. Typically comes on in spring and fal. Delsym w/ some improvement.  Ibuprofen 200 mg with minimal relief.   Past Medical History  Diagnosis Date  . Hypertension   . Hernia   . Asthma    Past Surgical History  Procedure Laterality Date  . Hernia repair    . Cesarean section    . Myomectomy    . Abdominal hysterectomy     Family History  Problem Relation Age of Onset  . Hypertension Mother   . Stroke Mother   . Dementia Mother   . COPD Father    History  Substance Use Topics  . Smoking status: Never Smoker   . Smokeless tobacco: Not on file  . Alcohol Use: No   OB History    No data available     Review of Systems Per HPI with all other pertinent systems negative.   Allergies  Codeine; Penicillins; and Sulfa drugs cross reactors  Home Medications   Prior to Admission medications   Medication Sig Start Date End Date Taking? Authorizing Provider  benzonatate (TESSALON) 200 MG capsule Take 1 capsule (200 mg total) by mouth 3 (three) times daily as needed for cough. 02/20/14   Linna HoffJames D Kindl, MD  clindamycin (CLEOCIN) 300 MG capsule Take 1 capsule (300 mg total) by mouth 3 (three) times daily. 08/07/14   Ozella Rocksavid J Cristol Engdahl, MD  fluconazole (DIFLUCAN) 150 MG tablet Take 1 tablet (150 mg total) by mouth daily. Repeat dose in 3 days 08/07/14   Ozella Rocksavid J Macon Lesesne, MD  fluticasone [facility](FLONASE) 50 MCG/ACT nasal spray Place 2 sprays into both nostrils at bedtime. 08/07/14   Ozella Rocksavid J Kathrene Sinopoli, MD  ipratropium (ATROVENT) 0.06 % nasal  spray Place 2 sprays into both nostrils 4 (four) times daily. 08/07/14   Ozella Rocksavid J Treysean Petruzzi, MD  metoprolol succinate (TOPROL-XL) 100 MG 24 hr tablet Take 100 mg by mouth at bedtime. Take with or immediately following a meal.    Historical Provider, MD  neomycin-bacitracin-polymyxin (NEOSPORIN) ointment Apply 1 application topically daily as needed (to cuts). apply to eye    Historical Provider, MD   BP 167/94 mmHg  Pulse 83  Temp(Src) 99.1 F (37.3 C) (Oral)  Resp 19  SpO2 96% Physical Exam Physical Exam  Constitutional: oriented to person, place, and time. appears well-developed and well-nourished. No distress.  HENT:  Pharyngeal injection or cobblestoning. Tonsils 1+ without exudate. Rhinorrhea. Frontomaxillary sinuses nontender to palpation. Left mandibular first molar with surrounding erythema and tenderness without purulence. Numerous dental cavities. Head: Normocephalic and atraumatic.  Eyes: EOMI. PERRL.  Neck: Normal range of motion.  Cardiovascular: RRR, no m/r/g, 2+ distal pulses,  Pulmonary/Chest: Effort normal and breath sounds normal. No respiratory distress.  Abdominal: Soft. Bowel sounds are normal. NonTTP, no distension.  Musculoskeletal: Normal range of motion. Non ttp, no effusion.  Neurological: alert and oriented to person, place, and time.  Skin: Skin is warm. No rash noted. non diaphoretic.  Psychiatric: normal mood and affect. behavior is normal. Judgment and thought content  normal.   ED Course  Procedures (including critical care time) Labs Review Labs Reviewed - No data to display  Imaging Review No results found.   MDM   1. Dental infection   2. Seasonal allergies    Penicillin and sulfa allergy. Start clindamycin. Follow-up with dentist next week. NSAIDs for pain relief.  *History of allergies. Start daily Zyrtec, nasal Atrovent, Flonase, increase IV present dose to 600-800 mg.  Hypertension: Elevated today. Patient states that she's been off her  metoprolol for several weeks and then try to manage her blood pressure with diet and exercise. Patient to discuss restarting blood pressure medication with her PCP.  Diflucan prescription given if develops [place] infection. Precautions given and all questions answered  Shelly Flatten, MD Family Medicine 08/07/2014, 11:51 AM       Ozella Rocks, MD 08/07/14 [phone]

## 2014-08-07 NOTE — Discharge Instructions (Signed)
Please start the clindamycin for your dental infection. If this is safe to take with your sulfa and penicillin allergies. This may also treat any underlying bacterial infection causing your other symptoms. Please start taking a daily allergy pill such as Zyrtec, continue the Delsym, start using the nasal Atrovent and Flonase. Please start taking ibuprofen 600-800 mg every 8 hours. Please use these Diflucan if you develop a yeast infection..Marland Kitchen

## 2014-11-01 ENCOUNTER — Encounter (HOSPITAL_COMMUNITY): Payer: Self-pay | Admitting: *Deleted

## 2014-11-01 ENCOUNTER — Emergency Department (INDEPENDENT_AMBULATORY_CARE_PROVIDER_SITE_OTHER)
Admission: EM | Admit: 2014-11-01 | Discharge: 2014-11-01 | Disposition: A | Discharge status: Home / Self Care | Payer: Self-pay | Source: Home / Self Care | Attending: Family Medicine | Admitting: Family Medicine

## 2014-11-01 DIAGNOSIS — S30861A Insect bite (nonvenomous) of abdominal wall, initial encounter: Secondary | ICD-10-CM

## 2014-11-01 DIAGNOSIS — W57XXXA Bitten or stung by nonvenomous insect and other nonvenomous arthropods, initial encounter: Principal | ICD-10-CM

## 2014-11-01 DIAGNOSIS — S30871A Other superficial bite of abdominal wall, initial encounter: Secondary | ICD-10-CM

## 2014-11-01 MED ORDER — MUPIROCIN 2 % EX OINT: TOPICAL_OINTMENT | CUTANEOUS | Status: DC

## 2014-11-01 NOTE — Discharge Instructions (Signed)
Wash area reularly and use medicine as needed.

## 2014-11-01 NOTE — ED Notes (Signed)
Pt  Reports  A   Crusty   Red  Lesion      For   Several  Days  On  Her  Lower  abd           denys  Any other        Symptoms       She  Is   Sitting  Upright on  The  Exam table speaking in complete sentances

## 2014-11-01 NOTE — ED Provider Notes (Addendum)
CSN: [id number]     Arrival date & time 11/01/14  1914 History   First MD Initiated Contact with Patient 11/01/14 1932     Chief Complaint  Patient presents with  . Skin Problem   (Consider location/radiation/quality/duration/timing/severity/associated sxs/prior Treatment) Patient is a 49 y.o. female presenting with rash. The history is provided by the patient.  Rash Location:  Torso Quality: redness   Quality: not painful, not swelling and not weeping   Severity:  Mild Onset quality:  Gradual Duration:  3 days Progression:  Unchanged Chronicity:  New Context: insect bite/sting   Relieved by:  None tried Worsened by:  Nothing tried Ineffective treatments:  None tried   Past Medical History  Diagnosis Date  . Hypertension   . Hernia   . Asthma    Past Surgical History  Procedure Laterality Date  . Hernia repair    . Cesarean section    . Myomectomy    . Abdominal hysterectomy     Family History  Problem Relation Age of Onset  . Hypertension Mother   . Stroke Mother   . Dementia Mother   . COPD Father    History  Substance Use Topics  . Smoking status: Never Smoker   . Smokeless tobacco: Not on file  . Alcohol Use: No   OB History    No data available     Review of Systems  Constitutional: Negative.   Gastrointestinal: Negative.   Skin: Positive for rash.    Allergies  Codeine; Penicillins; and Sulfa drugs cross reactors  Home Medications   Prior to Admission medications   Medication Sig Start Date End Date Taking? Authorizing Provider  benzonatate (TESSALON) 200 MG capsule Take 1 capsule (200 mg total) by mouth 3 (three) times daily as needed for cough. 02/20/14   Linna Hoff, MD  clindamycin (CLEOCIN) 300 MG capsule Take 1 capsule (300 mg total) by mouth 3 (three) times daily. 08/07/14   Ozella Rocks, MD  fluconazole (DIFLUCAN) 150 MG tablet Take 1 tablet (150 mg total) by mouth daily. Repeat dose in 3 days 08/07/14   Ozella Rocks, MD   fluticasone [facility]) 50 MCG/ACT nasal spray Place 2 sprays into both nostrils at bedtime. 08/07/14   Ozella Rocks, MD  ipratropium (ATROVENT) 0.06 % nasal spray Place 2 sprays into both nostrils 4 (four) times daily. 08/07/14   Ozella Rocks, MD  metoprolol succinate (TOPROL-XL) 100 MG 24 hr tablet Take 100 mg by mouth at bedtime. Take with or immediately following a meal.    Historical Provider, MD  mupirocin ointment (BACTROBAN) 2 % Apply to skin bid 11/01/14   Linna Hoff, MD  neomycin-bacitracin-polymyxin (NEOSPORIN) ointment Apply 1 application topically daily as needed (to cuts). apply to eye    Historical Provider, MD   BP 152/86 mmHg  Pulse 72  Temp(Src) 98.6 F (37 C) (Oral)  Resp 18  SpO2 100% Physical Exam  Constitutional: She is oriented to person, place, and time. She appears well-developed and well-nourished.  Neurological: She is alert and oriented to person, place, and time.  Skin: Skin is warm. Rash noted.  4mm circular red lesion below umbilicus, nontender.  Nursing note and vitals reviewed.   ED Course  Procedures (including critical care time) Labs Review Labs Reviewed - No data to display  Imaging Review No results found.   MDM   1. Insect bite of abdomen, initial encounter        Linna Hoff,  MD 11/01/14 1949  Linna Hoff, MD 11/01/14 2024

## 2015-02-28 ENCOUNTER — Emergency Department (HOSPITAL_COMMUNITY)
Admission: EM | Admit: 2015-02-28 | Discharge: 2015-02-28 | Disposition: A | Discharge status: Home / Self Care | Payer: Self-pay | Attending: Emergency Medicine | Admitting: Emergency Medicine

## 2015-02-28 ENCOUNTER — Encounter (HOSPITAL_COMMUNITY): Payer: Self-pay | Admitting: Emergency Medicine

## 2015-02-28 DIAGNOSIS — R300 Dysuria: Secondary | ICD-10-CM | POA: Insufficient documentation

## 2015-02-28 DIAGNOSIS — I10 Essential (primary) hypertension: Secondary | ICD-10-CM | POA: Insufficient documentation

## 2015-02-28 DIAGNOSIS — Z7951 Long term (current) use of inhaled steroids: Secondary | ICD-10-CM | POA: Insufficient documentation

## 2015-02-28 DIAGNOSIS — Z880 Allergy status to penicillin: Secondary | ICD-10-CM | POA: Insufficient documentation

## 2015-02-28 DIAGNOSIS — J45909 Unspecified asthma, uncomplicated: Secondary | ICD-10-CM | POA: Insufficient documentation

## 2015-02-28 DIAGNOSIS — Z792 Long term (current) use of antibiotics: Secondary | ICD-10-CM | POA: Insufficient documentation

## 2015-02-28 DIAGNOSIS — R112 Nausea with vomiting, unspecified: Secondary | ICD-10-CM | POA: Insufficient documentation

## 2015-02-28 DIAGNOSIS — R519 Headache, unspecified: Secondary | ICD-10-CM

## 2015-02-28 DIAGNOSIS — R51 Headache: Secondary | ICD-10-CM | POA: Insufficient documentation

## 2015-02-28 LAB — URINALYSIS, ROUTINE W REFLEX MICROSCOPIC
Bilirubin Urine: NEGATIVE
Glucose, UA: NEGATIVE mg/dL
Hgb urine dipstick: NEGATIVE
Ketones, ur: NEGATIVE mg/dL
Leukocytes, UA: NEGATIVE
Nitrite: NEGATIVE
Protein, ur: NEGATIVE mg/dL
Specific Gravity, Urine: 1.014 (ref 1.005–1.030)
Urobilinogen, UA: 0.2 mg/dL (ref 0.0–1.0)
pH: 7.5 (ref 5.0–8.0)

## 2015-02-28 LAB — URINE MICROSCOPIC-ADD ON

## 2015-02-28 MED ORDER — METOCLOPRAMIDE HCL 5 MG/ML IJ SOLN
10.0000 mg | Freq: Once | INTRAMUSCULAR | Status: AC
  Administered 2015-02-28: 10 mg via INTRAVENOUS
  Filled 2015-02-28: qty 2

## 2015-02-28 MED ORDER — SODIUM CHLORIDE 0.9 % IV BOLUS (SEPSIS)
1000.0000 mL | Freq: Once | INTRAVENOUS | Status: AC
  Administered 2015-02-28: 1000 mL via INTRAVENOUS

## 2015-02-28 MED ORDER — DEXAMETHASONE SODIUM PHOSPHATE 10 MG/ML IJ SOLN
10.0000 mg | Freq: Once | INTRAMUSCULAR | Status: AC
  Administered 2015-02-28: 10 mg via INTRAVENOUS
  Filled 2015-02-28: qty 1

## 2015-02-28 MED ORDER — ONDANSETRON HCL 4 MG PO TABS: 4.0000 mg | ORAL_TABLET | Freq: Four times a day (QID) | ORAL | Status: DC

## 2015-02-28 MED ORDER — DIPHENHYDRAMINE HCL 50 MG/ML IJ SOLN
25.0000 mg | Freq: Once | INTRAMUSCULAR | Status: AC
  Administered 2015-02-28: 25 mg via INTRAVENOUS
  Filled 2015-02-28: qty 1

## 2015-02-28 NOTE — ED Provider Notes (Signed)
CSN: [id number]     Arrival date & time 02/28/15  1653 History   First MD Initiated Contact with Patient 02/28/15 1709     Chief Complaint  Patient presents with  . Headache  . burning with urination    HPI  Kaitlyn Bray is a 49 year old female with PMHx of HTN and asthma presenting with dysuria and headache. Pt reports sensation of burning with urination for the past 3 days. Denies increased urgency, increased frequency, hematuria or vaginal discharge. Pt states that she does not drink water, only sweet tea and soda. She is also complaining of generalized, throbbing headache since this morning. She states that she has trouble controlling her blood pressure and this headache is typical of when her BP is too high. She has been seen in the ED multiple times for similar headaches and reports improvement after headache cocktails. She states that she "had a bad weekend. I ate a whole lot of country ham which I know has all that salt and sugar which makes my blood pressure high". She does not check her BP at home. She states that when she woke with the headache "I thought that throwing up would make it go away so I ran around outside until I was able to throw up". She denies improvement in headache after vomiting. She states that she is still slightly nauseous but that it is improving. Denies fevers, chills, dizziness, syncope, vision changes, chest pain, SOB, abdominal pain, diarrhea, back pain or flank pain.   Past Medical History  Diagnosis Date  . Hypertension   . Hernia   . Asthma    Past Surgical History  Procedure Laterality Date  . Hernia repair    . Cesarean section    . Myomectomy    . Abdominal hysterectomy     Family History  Problem Relation Age of Onset  . Hypertension Mother   . Stroke Mother   . Dementia Mother   . COPD Father    Social History  Substance Use Topics  . Smoking status: Never Smoker   . Smokeless tobacco: None  . Alcohol Use: No   OB History    No data  available     Review of Systems  Constitutional: Negative for fever and chills.  Eyes: Negative for visual disturbance.  Respiratory: Negative for shortness of breath.   Cardiovascular: Negative for chest pain.  Gastrointestinal: Positive for nausea and vomiting. Negative for abdominal pain and diarrhea.  Genitourinary: Positive for dysuria. Negative for urgency, frequency, hematuria, flank pain, decreased urine volume and vaginal discharge.  Musculoskeletal: Negative for back pain.  Skin: Negative for rash.  Neurological: Positive for headaches. Negative for dizziness, syncope, facial asymmetry, speech difficulty, weakness, light-headedness and numbness.      Allergies  Codeine; Penicillins; and Sulfa drugs cross reactors  Home Medications   Prior to Admission medications   Medication Sig Start Date End Date Taking? Authorizing Provider  benzonatate (TESSALON) 200 MG capsule Take 1 capsule (200 mg total) by mouth 3 (three) times daily as needed for cough. 02/20/14   Linna Hoff, MD  clindamycin (CLEOCIN) 300 MG capsule Take 1 capsule (300 mg total) by mouth 3 (three) times daily. 08/07/14   Ozella Rocks, MD  fluconazole (DIFLUCAN) 150 MG tablet Take 1 tablet (150 mg total) by mouth daily. Repeat dose in 3 days 08/07/14   Ozella Rocks, MD  fluticasone [facility]) 50 MCG/ACT nasal spray Place 2 sprays into both nostrils at bedtime.  08/07/14   Ozella Rocksavid J Merrell, MD  ipratropium (ATROVENT) 0.06 % nasal spray Place 2 sprays into both nostrils 4 (four) times daily. 08/07/14   Ozella Rocksavid J Merrell, MD  metoprolol succinate (TOPROL-XL) 100 MG 24 hr tablet Take 100 mg by mouth at bedtime. Take with or immediately following a meal.    Historical Provider, MD  mupirocin ointment (BACTROBAN) 2 % Apply to skin bid 11/01/14   Linna HoffJames D Kindl, MD  neomycin-bacitracin-polymyxin (NEOSPORIN) ointment Apply 1 application topically daily as needed (to cuts). apply to eye    Historical Provider, MD  ondansetron  (ZOFRAN) 4 MG tablet Take 1 tablet (4 mg total) by mouth every 6 (six) hours. 02/28/15   Tyreon Frigon, PA-C   BP 147/92 mmHg  Pulse 72  Temp(Src) 97.9 F (36.6 C) (Oral)  Resp 16  SpO2 96% Physical Exam  Constitutional: She is oriented to person, place, and time. She appears well-developed and well-nourished. No distress.  HENT:  Head: Normocephalic and atraumatic.  Mouth/Throat: Oropharynx is clear and moist. No oropharyngeal exudate.  Eyes: Conjunctivae and EOM are normal. Pupils are equal, round, and reactive to light. Right eye exhibits no discharge. Left eye exhibits no discharge. No scleral icterus.  Neck: Normal range of motion. Neck supple.  Cardiovascular: Normal rate, regular rhythm and normal heart sounds.   Pulmonary/Chest: Effort normal and breath sounds normal. No respiratory distress. She has no wheezes. She has no rales.  Abdominal: Soft. There is no tenderness. There is no rebound and no guarding.  Musculoskeletal: Normal range of motion.  Pt moves all extremities spontaneously and walks with a steady gait  Neurological: She is alert and oriented to person, place, and time. No cranial nerve deficit. Coordination normal.  Cranial nerves 3-12 intact. Major muscle groups with 5/5 motor strength. Sensation to light touch intact. Coordinated finger to nose. Walks with a steady gait.   Skin: Skin is warm and dry.  Psychiatric: She has a normal mood and affect. Her behavior is normal.  Nursing note and vitals reviewed.   ED Course  Procedures (including critical care time) Labs Review Labs Reviewed  URINALYSIS, ROUTINE W REFLEX MICROSCOPIC (NOT AT [facility]) - Abnormal; Notable for the following:    APPearance TURBID (*)    All other components within normal limits  URINE MICROSCOPIC-ADD ON    Imaging Review No results found. I have personally reviewed and evaluated these images and lab results as part of my medical decision-making.   EKG Interpretation None       MDM   Final diagnoses:  Dysuria  Headache, unspecified headache type  Nausea and vomiting, vomiting of unspecified type   Pt presenting with HA and dysuria. No other urinary symptoms at this time. No signs of UTI on UA. Pt HA treated and improved while in ED with headache cocktail. Presentation is like pts typical HA and non concerning [facility], Meningitis, or temporal arteritis. Pt is afebrile with no focal neuro deficits, nuchal rigidity, or change in vision. Pt is to follow up with PCP to discuss BP control. Pt verbalizes understanding and is agreeable with plan to dc. Return precautions given in discharge paperwork and discussed with pt at bedside. Pt stable for discharge      Alveta HeimlichStevi Caroleann Casler, PA-C 03/01/15 1236  Arby BarretteMarcy Pfeiffer, MD 03/06/15 [phone]

## 2015-02-28 NOTE — ED Notes (Signed)
C/o burning with urination x 3 days.  Reports headache since this morning with nausea, vomiting (x2), and generalized weakness.

## 2015-02-28 NOTE — Discharge Instructions (Signed)
- Call your PCP to schedule follow up appointment   General Headache Without Cause A headache is pain or discomfort felt around the head or neck area. The specific cause of a headache may not be found. There are many causes and types of headaches. A few common ones are:  Tension headaches.  Migraine headaches.  Cluster headaches.  Chronic daily headaches. HOME CARE INSTRUCTIONS  Watch your condition for any changes. Take these steps to help with your condition: Managing Pain  Take over-the-counter and prescription medicines only as told by your health care provider.  Lie down in a dark, quiet room when you have a headache.  If directed, apply ice to the head and neck area:  Put ice in a plastic bag.  Place a towel between your skin and the bag.  Leave the ice on for 20 minutes, 2-3 times per day.  Use a heating pad or hot shower to apply heat to the head and neck area as told by your health care provider.  Keep lights dim if bright lights bother you or make your headaches worse. Eating and Drinking  Eat meals on a regular schedule.  Limit alcohol use.  Decrease the amount of caffeine you drink, or stop drinking caffeine. General Instructions  Keep all follow-up visits as told by your health care provider. This is important.  Keep a headache journal to help find out what may trigger your headaches. For example, write down:  What you eat and drink.  How much sleep you get.  Any change to your diet or medicines.  Try massage or other relaxation techniques.  Limit stress.  Sit up straight, and do not tense your muscles.  Do not use tobacco products, including cigarettes, chewing tobacco, or e-cigarettes. If you need help quitting, ask your health care provider.  Exercise regularly as told by your health care provider.  Sleep on a regular schedule. Get 7-9 hours of sleep, or the amount recommended by your health care provider. SEEK MEDICAL CARE IF:   Your  symptoms are not helped by medicine.  You have a headache that is different from the usual headache.  You have nausea or you vomit.  You have a fever. SEEK IMMEDIATE MEDICAL CARE IF:   Your headache becomes severe.  You have repeated vomiting.  You have a stiff neck.  You have a loss of vision.  You have problems with speech.  You have pain in the eye or ear.  You have muscular weakness or loss of muscle control.  You lose your balance or have trouble walking.  You feel faint or pass out.  You have confusion.   This information is not intended to replace advice given to you by your health care provider. Make sure you discuss any questions you have with your health care provider.   Document Released: 05/01/2005 Document Revised: 01/20/2015 Document Reviewed: 08/24/2014 Elsevier Interactive Patient Education 2016 Else[employer] Inc[employer]  Dysuria Dysuria is pain or discomfort while urinating. The pain or discomfort may be felt in the tube that carries urine out of the bladder (urethra) or in the surrounding tissue of the genitals. The pain may also be felt in the groin area, lower abdomen, and lower back. You may have to urinate frequently or have the sudden feeling that you have to urinate (urgency). Dysuria can affect both men and women, but is more common in women. Dysuria can be caused by many different things, including:  Urinary tract infection in women.  Infection  of the kidney or bladder.  Kidney stones or bladder stones.  Certain sexually transmitted infections (STIs), such as chlamydia.  Dehydration.  Inflammation of the vagina.  Use of certain medicines.  Use of certain soaps or scented products that cause irritation. HOME CARE INSTRUCTIONS Watch your dysuria for any changes. The following actions may help to reduce any discomfort you are feeling:  Drink enough fluid to keep your urine clear or pale yellow.  Empty your bladder often. Avoid holding urine for  long periods of time.  After a bowel movement or urination, women should cleanse from front to back, using each tissue only once.  Empty your bladder after sexual intercourse.  Take medicines only as directed by your health care provider.  If you were prescribed an antibiotic medicine, finish it all even if you start to feel better.  Avoid caffeine, tea, and alcohol. They can irritate the bladder and make dysuria worse. In men, alcohol may irritate the prostate.  Keep all follow-up visits as directed by your health care provider. This is important.  If you had any tests done to find the cause of dysuria, it is your responsibility to obtain your test results. Ask the lab or department performing the test when and how you will get your results. Talk with your health care provider if you have any questions about your results. SEEK MEDICAL CARE IF:  You develop pain in your back or sides.  You have a fever.  You have nausea or vomiting.  You have blood in your urine.  You are not urinating as often as you usually do. SEEK IMMEDIATE MEDICAL CARE IF:  You pain is severe and not relieved with medicines.  You are unable to hold down any fluids.  You or someone else notices a change in your mental function.  You have a rapid heartbeat at rest.  You have shaking or chills.  You feel extremely weak.   This information is not intended to replace advice given to you by your health care provider. Make sure you discuss any questions you have with your health care provider.   Document Released: 01/28/2004 Document Revised: 05/22/2014 Document Reviewed: 12/25/2013 Elsevier Interactive Patient Education 2016 Elsevier Inc.   Nausea and Vomiting Nausea is a sick feeling that often comes before throwing up (vomiting). Vomiting is a reflex where stomach contents come out of your mouth. Vomiting can cause severe loss of body fluids (dehydration). Children and elderly adults can become  dehydrated quickly, especially if they also have diarrhea. Nausea and vomiting are symptoms of a condition or disease. It is important to find the cause of your symptoms. CAUSES   Direct irritation of the stomach lining. This irritation can result from increased acid production (gastroesophageal reflux disease), infection, food poisoning, taking certain medicines (such as nonsteroidal anti-inflammatory drugs), alcohol use, or tobacco use.  Signals from the brain.These signals could be caused by a headache, heat exposure, an inner ear disturbance, increased pressure in the brain from injury, infection, a tumor, or a concussion, pain, emotional stimulus, or metabolic problems.  An obstruction in the gastrointestinal tract (bowel obstruction).  Illnesses such as diabetes, hepatitis, gallbladder problems, appendicitis, kidney problems, cancer, sepsis, atypical symptoms of a heart attack, or eating disorders.  Medical treatments such as chemotherapy and radiation.  Receiving medicine that makes you sleep (general anesthetic) during surgery. DIAGNOSIS Your caregiver may ask for tests to be done if the problems do not improve after a few days. Tests may also be  done if symptoms are severe or if the reason for the nausea and vomiting is not clear. Tests may include:  Urine tests.  Blood tests.  Stool tests.  Cultures (to look for evidence of infection).  X-rays or other imaging studies. Test results can help your caregiver make decisions about treatment or the need for additional tests. TREATMENT You need to stay well hydrated. Drink frequently but in small amounts.You may wish to drink water, sports drinks, clear broth, or eat frozen ice pops or gelatin dessert to help stay hydrated.When you eat, eating slowly may help prevent nausea.There are also some antinausea medicines that may help prevent nausea. HOME CARE INSTRUCTIONS   Take all medicine as directed by your caregiver.  If you do  not have an appetite, do not force yourself to eat. However, you must continue to drink fluids.  If you have an appetite, eat a normal diet unless your caregiver tells you differently.  Eat a variety of complex carbohydrates (rice, wheat, potatoes, bread), lean meats, yogurt, fruits, and vegetables.  Avoid high-fat foods because they are more difficult to digest.  Drink enough water and fluids to keep your urine clear or pale yellow.  If you are dehydrated, ask your caregiver for specific rehydration instructions. Signs of dehydration may include:  Severe thirst.  Dry lips and mouth.  Dizziness.  Dark urine.  Decreasing urine frequency and amount.  Confusion.  Rapid breathing or pulse. SEEK IMMEDIATE MEDICAL CARE IF:   You have blood or brown flecks (like coffee grounds) in your vomit.  You have black or bloody stools.  You have a severe headache or stiff neck.  You are confused.  You have severe abdominal pain.  You have chest pain or trouble breathing.  You do not urinate at least once every 8 hours.  You develop cold or clammy skin.  You continue to vomit for longer than 24 to 48 hours.  You have a fever. MAKE SURE YOU:   Understand these instructions.  Will watch your condition.  Will get help right away if you are not doing well or get worse.   This information is not intended to replace advice given to you by your health care provider. Make sure you discuss any questions you have with your health care provider.   Document Released: 05/01/2005 Document Revised: 07/24/2011 Document Reviewed: 09/28/2010 Elsevier Interactive Patient Education [employer]

## 2015-02-28 NOTE — ED Notes (Signed)
Discharge instructions/prescription reviewed with patient. Understanding verbalized. Patient declined wheelchair at time of discharge.  

## 2015-08-02 ENCOUNTER — Encounter (HOSPITAL_COMMUNITY): Payer: Self-pay | Admitting: Emergency Medicine

## 2015-08-02 ENCOUNTER — Emergency Department (HOSPITAL_COMMUNITY)
Admission: EM | Admit: 2015-08-02 | Discharge: 2015-08-02 | Disposition: A | Discharge status: Home / Self Care | Payer: Self-pay | Attending: Emergency Medicine | Admitting: Emergency Medicine

## 2015-08-02 DIAGNOSIS — M545 Low back pain, unspecified: Secondary | ICD-10-CM

## 2015-08-02 DIAGNOSIS — S76312A Strain of muscle, fascia and tendon of the posterior muscle group at thigh level, left thigh, initial encounter: Secondary | ICD-10-CM

## 2015-08-02 DIAGNOSIS — Y9389 Activity, other specified: Secondary | ICD-10-CM | POA: Insufficient documentation

## 2015-08-02 DIAGNOSIS — J45909 Unspecified asthma, uncomplicated: Secondary | ICD-10-CM | POA: Insufficient documentation

## 2015-08-02 DIAGNOSIS — R300 Dysuria: Secondary | ICD-10-CM | POA: Insufficient documentation

## 2015-08-02 DIAGNOSIS — I10 Essential (primary) hypertension: Secondary | ICD-10-CM | POA: Insufficient documentation

## 2015-08-02 DIAGNOSIS — S76912A Strain of unspecified muscles, fascia and tendons at thigh level, left thigh, initial encounter: Secondary | ICD-10-CM | POA: Insufficient documentation

## 2015-08-02 DIAGNOSIS — Y9289 Other specified places as the place of occurrence of the external cause: Secondary | ICD-10-CM | POA: Insufficient documentation

## 2015-08-02 DIAGNOSIS — R350 Frequency of micturition: Secondary | ICD-10-CM | POA: Insufficient documentation

## 2015-08-02 DIAGNOSIS — Z880 Allergy status to penicillin: Secondary | ICD-10-CM | POA: Insufficient documentation

## 2015-08-02 DIAGNOSIS — Y998 Other external cause status: Secondary | ICD-10-CM | POA: Insufficient documentation

## 2015-08-02 DIAGNOSIS — X58XXXA Exposure to other specified factors, initial encounter: Secondary | ICD-10-CM | POA: Insufficient documentation

## 2015-08-02 DIAGNOSIS — Z79899 Other long term (current) drug therapy: Secondary | ICD-10-CM | POA: Insufficient documentation

## 2015-08-02 LAB — URINALYSIS, ROUTINE W REFLEX MICROSCOPIC
Bilirubin Urine: NEGATIVE
Glucose, UA: NEGATIVE mg/dL
Hgb urine dipstick: NEGATIVE
Ketones, ur: NEGATIVE mg/dL
Leukocytes, UA: NEGATIVE
Nitrite: NEGATIVE
Protein, ur: NEGATIVE mg/dL
Specific Gravity, Urine: 1.024 (ref 1.005–1.030)
pH: 6 (ref 5.0–8.0)

## 2015-08-02 NOTE — ED Notes (Signed)
Pt sts lower back pain with urinary frequency and urgency. Pt sts left leg pain as well

## 2015-08-02 NOTE — ED Provider Notes (Signed)
CSN: [id number]     Arrival date & time 08/02/15  1046 History   First MD Initiated Contact with Patient 08/02/15 1346     Chief Complaint  Patient presents with  . Back Pain  . Dysuria     (Consider location/radiation/quality/duration/timing/severity/associated sxs/prior Treatment) Patient is a 50 y.o. female presenting with back pain and dysuria. The history is provided by the patient.  Back Pain Location:  Sacro-iliac joint Quality:  Aching Radiates to:  Does not radiate Pain severity:  Mild Onset quality:  Gradual Duration:  1 week Timing:  Constant Progression:  Worsening Chronicity:  New Relieved by: urination. Worsened by:  Nothing tried Ineffective treatments:  None tried Associated symptoms: dysuria   Associated symptoms: no bladder incontinence, no bowel incontinence, no chest pain, no fever and no headaches   Dysuria Associated symptoms: no fever, no nausea and no vomiting    50 yo F with urinary symptoms and low back pain.  Going on for past couple days.  Back symptoms resolve with urination.  Has sudden urge and runs to the bathroom sometimes with incontinence.  Denies bowel incontinence or saddle anesthesia.  Denies leg weakness.  Having some pain with motion to left hamstring off and on.   Past Medical History  Diagnosis Date  . Hypertension   . Hernia   . Asthma    Past Surgical History  Procedure Laterality Date  . Hernia repair    . Cesarean section    . Myomectomy    . Abdominal hysterectomy     Family History  Problem Relation Age of Onset  . Hypertension Mother   . Stroke Mother   . Dementia Mother   . COPD Father    Social History  Substance Use Topics  . Smoking status: Never Smoker   . Smokeless tobacco: None  . Alcohol Use: No   OB History    No data available     Review of Systems  Constitutional: Negative for fever and chills.  HENT: Negative for congestion and rhinorrhea.   Eyes: Negative for redness and visual disturbance.   Respiratory: Negative for shortness of breath and wheezing.   Cardiovascular: Negative for chest pain and palpitations.  Gastrointestinal: Negative for nausea, vomiting and bowel incontinence.  Genitourinary: Positive for dysuria, urgency and frequency. Negative for bladder incontinence.  Musculoskeletal: Positive for myalgias, back pain and arthralgias.  Skin: Negative for pallor and wound.  Neurological: Negative for dizziness and headaches.      Allergies  Codeine; Penicillins; and Sulfa drugs cross reactors  Home Medications   Prior to Admission medications   Medication Sig Start Date End Date Taking? Authorizing Provider  hydrocortisone cream 1 % Apply 1 application topically daily as needed for itching.   Yes Historical Provider, MD  ibuprofen (ADVIL,MOTRIN) 200 MG tablet Take 200 mg by mouth every 6 (six) hours as needed. pain   Yes Historical Provider, MD  metoprolol (LOPRESSOR) 50 MG tablet Take 50 mg by mouth 2 (two) times daily. 07/13/15  Yes Historical Provider, MD  neomycin-bacitracin-polymyxin (NEOSPORIN) ointment Apply 1 application topically daily as needed for wound care. apply to eye   Yes Historical Provider, MD  benzonatate (TESSALON) 200 MG capsule Take 1 capsule (200 mg total) by mouth 3 (three) times daily as needed for cough. Patient not taking: Reported on 08/02/2015 02/20/14   Linna HoffJames D Kindl, MD  clindamycin (CLEOCIN) 300 MG capsule Take 1 capsule (300 mg total) by mouth 3 (three) times daily. Patient not taking:  Reported on 08/02/2015 08/07/14   Ozella Rocks, MD  fluconazole (DIFLUCAN) 150 MG tablet Take 1 tablet (150 mg total) by mouth daily. Repeat dose in 3 days Patient not taking: Reported on 08/02/2015 08/07/14   Ozella Rocks, MD  fluticasone [facility]) 50 MCG/ACT nasal spray Place 2 sprays into both nostrils at bedtime. Patient not taking: Reported on 08/02/2015 08/07/14   Ozella Rocks, MD  ipratropium (ATROVENT) 0.06 % nasal spray Place 2 sprays into  both nostrils 4 (four) times daily. Patient not taking: Reported on 08/02/2015 08/07/14   Ozella Rocks, MD  mupirocin ointment [facility]) 2 % Apply to skin bid Patient not taking: Reported on 08/02/2015 11/01/14   Linna Hoff, MD  ondansetron (ZOFRAN) 4 MG tablet Take 1 tablet (4 mg total) by mouth every 6 (six) hours. Patient not taking: Reported on 08/02/2015 02/28/15   Stevi Barrett, PA-C   BP 157/92 mmHg  Pulse 70  Temp(Src) 98.1 F (36.7 C) (Oral)  Resp 18  Ht  (1.651 m)  Wt 230 lb (104.327 kg)  BMI 38.27 kg/m2  SpO2 99% Physical Exam  Constitutional: She is oriented to person, place, and time. She appears well-developed and well-nourished. No distress.  HENT:  Head: Normocephalic and atraumatic.  Eyes: EOM are normal. Pupils are equal, round, and reactive to light.  Neck: Normal range of motion. Neck supple.  Cardiovascular: Normal rate and regular rhythm.  Exam reveals no gallop and no friction rub.   No murmur heard. Pulmonary/Chest: Effort normal. She has no wheezes. She has no rales.  Abdominal: Soft. She exhibits no distension. There is no tenderness.  Musculoskeletal: She exhibits tenderness (to left distal hamstring). She exhibits no edema.  Neurological: She is alert and oriented to person, place, and time.  Skin: Skin is warm and dry. She is not diaphoretic.  Psychiatric: She has a normal mood and affect. Her behavior is normal.  Nursing note and vitals reviewed.   ED Course  Procedures (including critical care time) Labs Review Labs Reviewed  URINALYSIS, ROUTINE W REFLEX MICROSCOPIC (NOT AT [facility])    Imaging Review No results found. I have personally reviewed and evaluated these images and lab results as part of my medical decision-making.   EKG Interpretation None      MDM   Final diagnoses:  Bilateral low back pain without sciatica  Hamstring strain, left, initial encounter    50 yo F with urinary symptoms. UA negative for infection.  Not  with urinary retention on bladder scan.  No glucose of blood in ua.  Discussed pcp follow up, nsaids for hamstring pain.  Will have follow up with gyn for possible bladder sling.    I have discussed the diagnosis/risks/treatment options with the patient and believe the pt to be eligible for discharge home to follow-up with gyn, pcp. We also discussed returning to the ED immediately if new or worsening sx occur. We discussed the sx which are most concerning (e.g., cauda equina s/s) that necessitate immediate return. Medications administered to the patient during their visit and any new prescriptions provided to the patient are listed below.  Medications given during this visit Medications - No data to display  Discharge Medication List as of 08/02/2015  3:13 PM      The patient appears reasonably screen and/or stabilized for discharge and I doubt any other medical condition or other [facility] requiring further screening, evaluation, or treatment in the ED at this time prior to discharge.  Melene Plan, DO 08/02/15 2124

## 2015-08-02 NOTE — Discharge Instructions (Signed)
Take 4 over the counter ibuprofen tablets 3 times a day or 2 over-the-counter naproxen tablets twice a day for pain.  Hamstring Strain A hamstring strain is an injury that occurs when the hamstring muscles are overstretched or overloaded. The hamstring muscles are a group of muscles at the back of the thighs. These muscles are used in straightening the hips, bending the knees, and pulling back the legs. This type of injury is often called a pulled hamstring muscle. The severity of a muscle strain is rated in degrees. First-degree strains have the least amount of muscle fiber tearing and pain. Second-degree and third-degree strains have increasingly more tearing and pain. CAUSES Hamstring strains occur when a sudden, violent force is placed on these muscles and stretches them too far. This often occurs during activities that involve running, jumping, kicking, or weight lifting. RISK FACTORS Hamstring strains are especially common in athletes. Other things that can increase your risk for this injury include:  Having low strength, endurance, or flexibility of the hamstring muscles.  Performing high-impact physical activity.  Having poor physical fitness.  Having a previous leg injury.  Having fatigued muscles.  Older age. SIGNS AND SYMPTOMS  Pain in the back of the thigh.  Bruising.  Swelling.  Muscle spasm.  Difficulty using the muscle because of pain or lack of normal function. For severe strains, you may have a popping or snapping feeling when the injury occurs. DIAGNOSIS Your health care provider will perform a physical exam and ask about your medical history.  TREATMENT Often, the best treatment for a hamstring strain is protecting, resting, icing, applying compression, and elevating the injured area. This is referred to as the PRICE method of treatment. Your health care provider may also recommend medicines to help reduce pain or inflammation. HOME CARE INSTRUCTIONS  Use the  PRICE method of treatment to promote muscle healing during the first 2-3 days after your injury. The PRICE method involves:  P--Protecting the muscle from being injured again.  R--Restricting your activity and resting the injured body part.  I--Icing your injury. To do this, put ice in a plastic bag. Place a towel between your skin and the bag. Then, apply the ice and leave it on for 20 minutes, 2-3 times per day. After the third day, switch to moist heat packs.  C--Applying compression to the injured area with an elastic bandage. Be careful not to wrap it too tightly. That may interfere with blood circulation or may increase swelling.  E--Elevating the injured body part above the level of your heart as often as you can. You can do this by putting a pillow under your thigh when you sit or lie down.  Take medicines only as directed by your health care provider.  Begin exercising or stretching as directed by your health care provider.  Do not return to full activity level until your health care provider approves.  Keep all follow-up visits as directed by your health care provider. This is important. SEEK MEDICAL CARE IF:  You have increasing pain or swelling in the injured area.  You have numbness, tingling, or a significant loss of strength in the injured area.  Your foot or your toes become cold or turn blue.   This information is not intended to replace advice given to you by your health care provider. Make sure you discuss any questions you have with your health care provider.   Document Released: 01/24/2001 Document Revised: 05/22/2014 Document Reviewed: 12/15/2013 Elsevier Interactive Patient Education 2016  Elsevier Inc. ° °

## 2015-10-12 ENCOUNTER — Emergency Department (HOSPITAL_COMMUNITY): Payer: Self-pay

## 2015-10-12 ENCOUNTER — Encounter (HOSPITAL_COMMUNITY): Payer: Self-pay | Admitting: Emergency Medicine

## 2015-10-12 ENCOUNTER — Emergency Department (HOSPITAL_COMMUNITY)
Admission: EM | Admit: 2015-10-12 | Discharge: 2015-10-12 | Disposition: A | Discharge status: Home / Self Care | Payer: Self-pay | Attending: Emergency Medicine | Admitting: Emergency Medicine

## 2015-10-12 DIAGNOSIS — R61 Generalized hyperhidrosis: Secondary | ICD-10-CM | POA: Insufficient documentation

## 2015-10-12 DIAGNOSIS — R350 Frequency of micturition: Secondary | ICD-10-CM | POA: Insufficient documentation

## 2015-10-12 DIAGNOSIS — J45909 Unspecified asthma, uncomplicated: Secondary | ICD-10-CM | POA: Insufficient documentation

## 2015-10-12 DIAGNOSIS — I10 Essential (primary) hypertension: Secondary | ICD-10-CM | POA: Insufficient documentation

## 2015-10-12 DIAGNOSIS — R109 Unspecified abdominal pain: Secondary | ICD-10-CM

## 2015-10-12 DIAGNOSIS — Z79899 Other long term (current) drug therapy: Secondary | ICD-10-CM | POA: Insufficient documentation

## 2015-10-12 DIAGNOSIS — Z90710 Acquired absence of both cervix and uterus: Secondary | ICD-10-CM | POA: Insufficient documentation

## 2015-10-12 DIAGNOSIS — R300 Dysuria: Secondary | ICD-10-CM | POA: Insufficient documentation

## 2015-10-12 DIAGNOSIS — Z9889 Other specified postprocedural states: Secondary | ICD-10-CM | POA: Insufficient documentation

## 2015-10-12 DIAGNOSIS — Z880 Allergy status to penicillin: Secondary | ICD-10-CM | POA: Insufficient documentation

## 2015-10-12 DIAGNOSIS — K439 Ventral hernia without obstruction or gangrene: Secondary | ICD-10-CM | POA: Insufficient documentation

## 2015-10-12 LAB — COMPREHENSIVE METABOLIC PANEL
ALT: 13 U/L — ABNORMAL LOW (ref 14–54)
AST: 15 U/L (ref 15–41)
Albumin: 3.6 g/dL (ref 3.5–5.0)
Alkaline Phosphatase: 63 U/L (ref 38–126)
Anion gap: 7 (ref 5–15)
BUN: 11 mg/dL (ref 6–20)
CO2: 29 mmol/L (ref 22–32)
Calcium: 9 mg/dL (ref 8.9–10.3)
Chloride: 104 mmol/L (ref 101–111)
Creatinine, Ser: 0.66 mg/dL (ref 0.44–1.00)
GFR calc Af Amer: >60 mL/min (ref >60)
GFR calc non Af Amer: >60 mL/min (ref >60)
Glucose, Bld: 101 mg/dL — ABNORMAL HIGH (ref 65–99)
Potassium: 4 mmol/L (ref 3.5–5.1)
Sodium: 140 mmol/L (ref 135–145)
Total Bilirubin: 0.3 mg/dL (ref 0.3–1.2)
Total Protein: 6.9 g/dL (ref 6.5–8.1)

## 2015-10-12 LAB — CBC
HCT: 42.6 % (ref 36.0–46.0)
Hemoglobin: 13.6 g/dL (ref 12.0–15.0)
MCH: 28.2 pg (ref 26.0–34.0)
MCHC: 31.9 g/dL (ref 30.0–36.0)
MCV: 88.4 fL (ref 78.0–100.0)
Platelets: 202 10*3/uL (ref 150–400)
RBC: 4.82 MIL/uL (ref 3.87–5.11)
RDW: 13.2 % (ref 11.5–15.5)
WBC: 7 10*3/uL (ref 4.0–10.5)

## 2015-10-12 LAB — LIPASE, BLOOD: Lipase: 20 U/L (ref 11–51)

## 2015-10-12 LAB — URINALYSIS, ROUTINE W REFLEX MICROSCOPIC
Bilirubin Urine: NEGATIVE
Glucose, UA: NEGATIVE mg/dL
Hgb urine dipstick: NEGATIVE
Ketones, ur: NEGATIVE mg/dL
Leukocytes, UA: NEGATIVE
Nitrite: NEGATIVE
Protein, ur: NEGATIVE mg/dL
Specific Gravity, Urine: 1.019 (ref 1.005–1.030)
pH: 7 (ref 5.0–8.0)

## 2015-10-12 LAB — I-STAT TROPONIN, ED: Troponin i, poc: 0 ng/mL (ref 0.00–0.08)

## 2015-10-12 MED ORDER — DOCUSATE SODIUM 100 MG PO CAPS: 100.0000 mg | ORAL_CAPSULE | Freq: Two times a day (BID) | ORAL | Status: DC

## 2015-10-12 MED ORDER — FENTANYL CITRATE (PF) 100 MCG/2ML IJ SOLN: 100.0000 ug | Freq: Once | INTRAMUSCULAR | Status: DC

## 2015-10-12 MED ORDER — ONDANSETRON HCL 4 MG/2ML IJ SOLN: 4.0000 mg | Freq: Once | INTRAMUSCULAR | Status: DC

## 2015-10-12 MED ORDER — IOPAMIDOL (ISOVUE-300) INJECTION 61%
INTRAVENOUS | Status: AC
  Administered 2015-10-12: 100 mL via INTRAVENOUS
  Filled 2015-10-12: qty 100

## 2015-10-12 NOTE — ED Notes (Signed)
Patient states woke this morning with abdominal pain that shot between her breasts.   Patient states stomach is distended.  Patient states sharp pain in mid chest that lasted for 15 min but is now resolved.   Patient states "stomach feels bloated".   Patient states nausea and diaphoresis with chest pain this morning.   Denies vomiting or diarrhea.   Denies other symptoms.

## 2015-10-12 NOTE — ED Notes (Signed)
Pt is in stable condition upon d/c and ambulates from ED. 

## 2015-10-12 NOTE — ED Provider Notes (Signed)
CSN: [id number]     Arrival date & time 10/12/15  [id number] History   First MD Initiated Contact with Patient 10/12/15 [phone]     Chief Complaint  Patient presents with  . Chest Pain  . Abdominal Pain     (Consider location/radiation/quality/duration/timing/severity/associated sxs/prior Treatment) HPI  50 year old female presents with acute abdominal pain. States she woke up and felt abdominal pain across the middle of her abdomen. Seemed to get a little bit better when she went to the bathroom and had a bowel movement but the pain is still there. She states anytime she coughs the pain significantly worsens. The pain is now mostly in her upper abdomen. Has felt nauseated but no vomiting. No diarrhea. She states it felt like she had a hard time going to the bathroom this morning. At rest the pain is not too bad but anytime she coughs it becomes severe. She has a known hernia in her right lower abdomen and states this hurts when coughing as well. Nurse note notes chest pain but the patient states there has been no chest pain or shortness of breath. One time when the pain got really bad she broke into a sweat. She feels like currently her abdomen is distended. States she chronically has urinary frequency and has been having dysuria, most recently this AM.   Past Medical History  Diagnosis Date  . Hypertension   . Hernia   . Asthma    Past Surgical History  Procedure Laterality Date  . Hernia repair    . Cesarean section    . Myomectomy    . Abdominal hysterectomy     Family History  Problem Relation Age of Onset  . Hypertension Mother   . Stroke Mother   . Dementia Mother   . COPD Father    Social History  Substance Use Topics  . Smoking status: Never Smoker   . Smokeless tobacco: None  . Alcohol Use: No   OB History    No data available     Review of Systems  Constitutional: Positive for diaphoresis. Negative for fever.  Respiratory: Positive for cough (x 1 week). Negative for  shortness of breath.   Cardiovascular: Negative for chest pain.  Gastrointestinal: Positive for nausea and abdominal pain. Negative for vomiting and diarrhea.  Genitourinary: Positive for dysuria and frequency.  All other systems reviewed and are negative.     Allergies  Codeine; Penicillins; and Sulfa drugs cross reactors  Home Medications   Prior to Admission medications   Medication Sig Start Date End Date Taking? Authorizing Provider  benzonatate (TESSALON) 200 MG capsule Take 1 capsule (200 mg total) by mouth 3 (three) times daily as needed for cough. Patient not taking: Reported on 08/02/2015 02/20/14   Linna HoffJames D Kindl, MD  clindamycin (CLEOCIN) 300 MG capsule Take 1 capsule (300 mg total) by mouth 3 (three) times daily. Patient not taking: Reported on 08/02/2015 08/07/14   Ozella Rocksavid J Merrell, MD  fluconazole (DIFLUCAN) 150 MG tablet Take 1 tablet (150 mg total) by mouth daily. Repeat dose in 3 days Patient not taking: Reported on 08/02/2015 08/07/14   Ozella Rocksavid J Merrell, MD  fluticasone [facility](FLONASE) 50 MCG/ACT nasal spray Place 2 sprays into both nostrils at bedtime. Patient not taking: Reported on 08/02/2015 08/07/14   Ozella Rocksavid J Merrell, MD  hydrocortisone cream 1 % Apply 1 application topically daily as needed for itching.    Historical Provider, MD  ibuprofen (ADVIL,MOTRIN) 200 MG tablet Take 200 mg by mouth every  6 (six) hours as needed. pain    Historical Provider, MD  ipratropium (ATROVENT) 0.06 % nasal spray Place 2 sprays into both nostrils 4 (four) times daily. Patient not taking: Reported on 08/02/2015 08/07/14   Ozella Rocks, MD  metoprolol (LOPRESSOR) 50 MG tablet Take 50 mg by mouth 2 (two) times daily. 07/13/15   Historical Provider, MD  mupirocin ointment (BACTROBAN) 2 % Apply to skin bid Patient not taking: Reported on 08/02/2015 11/01/14   Linna Hoff, MD  neomycin-bacitracin-polymyxin (NEOSPORIN) ointment Apply 1 application topically daily as needed for wound care. apply to eye     Historical Provider, MD  ondansetron (ZOFRAN) 4 MG tablet Take 1 tablet (4 mg total) by mouth every 6 (six) hours. Patient not taking: Reported on 08/02/2015 02/28/15   Stevi Barrett, PA-C   BP 189/102 mmHg  Pulse 64  Temp(Src) 98.6 F (37 C) (Oral)  Resp 20  Ht  (1.651 m)  Wt 230 lb (104.327 kg)  BMI 38.27 kg/m2  SpO2 97% Physical Exam  Constitutional: She is oriented to person, place, and time. She appears well-developed and well-nourished.  HENT:  Head: Normocephalic and atraumatic.  Right Ear: External ear normal.  Left Ear: External ear normal.  Nose: Nose normal.  Eyes: Right eye exhibits no discharge. Left eye exhibits no discharge.  Cardiovascular: Normal rate, regular rhythm and normal heart sounds.   Pulmonary/Chest: Effort normal and breath sounds normal.  Abdominal: Soft. There is tenderness. A hernia is present. Hernia confirmed positive in the ventral area (noticed when coughing. No obvious strangulation).    Neurological: She is alert and oriented to person, place, and time.  Skin: Skin is warm and dry.  Nursing note and vitals reviewed.   ED Course  Procedures (including critical care time) Labs Review Labs Reviewed  COMPREHENSIVE METABOLIC PANEL - Abnormal; Notable for the following:    Glucose, Bld 101 (*)    ALT 13 (*)    All other components within normal limits  CBC  LIPASE, BLOOD  URINALYSIS, ROUTINE W REFLEX MICROSCOPIC (NOT AT [facility])  Rosezena Sensor, ED    Imaging Review Dg Chest 2 View  10/12/2015  CLINICAL DATA:  Dry cough for 2 weeks EXAM: CHEST  2 VIEW COMPARISON:  01/03/2014 FINDINGS: The heart size and mediastinal contours are within normal limits. Both lungs are clear. The visualized skeletal structures are unremarkable. IMPRESSION: No active cardiopulmonary disease. Electronically Signed   By: Alcide Clever M.D.   On: 10/12/2015 10:17   Ct Abdomen Pelvis W Contrast  10/12/2015  CLINICAL DATA:  Acute upper abdominal pain. EXAM: CT  ABDOMEN AND PELVIS WITH CONTRAST TECHNIQUE: Multidetector CT imaging of the abdomen and pelvis was performed using the standard protocol following bolus administration of intravenous contrast. CONTRAST:  ISOVUE-300 IOPAMIDOL (ISOVUE-300) INJECTION 61% COMPARISON:  CT scan of March 03, 2013. FINDINGS: Visualized lung bases are unremarkable. No significant osseous abnormality is noted. No gallstones are noted. The liver, spleen and pancreas unremarkable. Adrenal glands appear normal. No hydronephrosis or renal obstruction is noted. Stable left renal cysts are noted. No renal or ureteral calculi are noted. Stable 2 fat containing ventral hernias are noted inferior to the umbilicus. Postsurgical changes are seen in this area. There is no evidence of bowel obstruction. No abnormal fluid collection is noted. Sigmoid diverticulosis is noted without inflammation. Stool is noted throughout the colon. Urinary bladder appears normal. Status post hysterectomy. Ovaries are unremarkable. No significant adenopathy is noted. IMPRESSION: Stable fat  containing ventral hernias are noted inferior to the umbilicus. Sigmoid diverticulosis is noted without inflammation. No acute abnormality seen in the abdomen or pelvis. Electronically Signed   By: Lupita Raider, M.D.   On: 10/12/2015 12:53   I have personally reviewed and evaluated these images and lab results as part of my medical decision-making.   EKG Interpretation   Date/Time:  Tuesday Oct 12 2015 09:50:05 EDT Ventricular Rate:  61 PR Interval:  160 QRS Duration: 87 QT Interval:  418 QTC Calculation: 421 R Axis:   56 Text Interpretation:  Normal sinus rhythm no acute ST/T changes no  significant change since 2015 Confirmed by Neven Fina MD, Cassie Henkels [phone]) on  10/12/2015 9:59:06 AM      MDM   Final diagnoses:  Abdominal pain, unspecified abdominal location  Ventral hernia without obstruction or gangrene    While chief complaint/nurse's note mentions  chest pain patient denies this now or recently. Abd pain is reproducible. It is likely muscular in etiology given worsening with coughing or movement. No obvious intra-abd emergency on CT. Labwork benign. Patient will be given colace for reported hard stools and f/u with PCP. Discussed using NSAIDs and tylenol for pain    Pricilla Loveless, MD 10/12/15 [phone]

## 2015-11-21 ENCOUNTER — Encounter: Payer: Self-pay | Admitting: Emergency Medicine

## 2015-11-21 ENCOUNTER — Emergency Department
Admission: EM | Admit: 2015-11-21 | Discharge: 2015-11-21 | Disposition: A | Discharge status: Home / Self Care | Payer: Self-pay | Attending: Emergency Medicine | Admitting: Emergency Medicine

## 2015-11-21 DIAGNOSIS — I10 Essential (primary) hypertension: Secondary | ICD-10-CM | POA: Insufficient documentation

## 2015-11-21 DIAGNOSIS — A059 Bacterial foodborne intoxication, unspecified: Secondary | ICD-10-CM

## 2015-11-21 DIAGNOSIS — R51 Headache: Secondary | ICD-10-CM | POA: Insufficient documentation

## 2015-11-21 DIAGNOSIS — J45909 Unspecified asthma, uncomplicated: Secondary | ICD-10-CM | POA: Insufficient documentation

## 2015-11-21 DIAGNOSIS — Z79899 Other long term (current) drug therapy: Secondary | ICD-10-CM | POA: Insufficient documentation

## 2015-11-21 DIAGNOSIS — R519 Headache, unspecified: Secondary | ICD-10-CM

## 2015-11-21 DIAGNOSIS — R112 Nausea with vomiting, unspecified: Secondary | ICD-10-CM

## 2015-11-21 DIAGNOSIS — T6294XA Toxic effect of unspecified noxious substance eaten as food, undetermined, initial encounter: Secondary | ICD-10-CM | POA: Insufficient documentation

## 2015-11-21 LAB — URINALYSIS COMPLETE WITH MICROSCOPIC (ARMC ONLY)
Bacteria, UA: NONE SEEN
Bilirubin Urine: NEGATIVE
Glucose, UA: NEGATIVE mg/dL
Hgb urine dipstick: NEGATIVE
Ketones, ur: NEGATIVE mg/dL
Leukocytes, UA: NEGATIVE
Nitrite: NEGATIVE
Protein, ur: NEGATIVE mg/dL
Specific Gravity, Urine: 1.018 (ref 1.005–1.030)
pH: 6 (ref 5.0–8.0)

## 2015-11-21 LAB — COMPREHENSIVE METABOLIC PANEL
ALT: 17 U/L (ref 14–54)
AST: 20 U/L (ref 15–41)
Albumin: 4.3 g/dL (ref 3.5–5.0)
Alkaline Phosphatase: 70 U/L (ref 38–126)
Anion gap: 9 (ref 5–15)
BUN: 12 mg/dL (ref 6–20)
CO2: 27 mmol/L (ref 22–32)
Calcium: 9 mg/dL (ref 8.9–10.3)
Chloride: 102 mmol/L (ref 101–111)
Creatinine, Ser: 0.59 mg/dL (ref 0.44–1.00)
GFR calc Af Amer: >60 mL/min (ref >60)
GFR calc non Af Amer: >60 mL/min (ref >60)
Glucose, Bld: 120 mg/dL — ABNORMAL HIGH (ref 65–99)
Potassium: 3.5 mmol/L (ref 3.5–5.1)
Sodium: 138 mmol/L (ref 135–145)
Total Bilirubin: 0.4 mg/dL (ref 0.3–1.2)
Total Protein: 7.7 g/dL (ref 6.5–8.1)

## 2015-11-21 LAB — CBC
HCT: 41.9 % (ref 35.0–47.0)
Hemoglobin: 14.7 g/dL (ref 12.0–16.0)
MCH: 29.9 pg (ref 26.0–34.0)
MCHC: 35.1 g/dL (ref 32.0–36.0)
MCV: 85.3 fL (ref 80.0–100.0)
Platelets: 192 10*3/uL (ref 150–440)
RBC: 4.92 MIL/uL (ref 3.80–5.20)
RDW: 13.4 % (ref 11.5–14.5)
WBC: 10.8 10*3/uL (ref 3.6–11.0)

## 2015-11-21 LAB — LIPASE, BLOOD: Lipase: 17 U/L (ref 11–51)

## 2015-11-21 MED ORDER — ONDANSETRON HCL 4 MG/2ML IJ SOLN
INTRAMUSCULAR | Status: AC
  Administered 2015-11-21: 4 mg via INTRAVENOUS
  Filled 2015-11-21: qty 2

## 2015-11-21 MED ORDER — METOCLOPRAMIDE HCL 10 MG PO TABS: 10.0000 mg | ORAL_TABLET | Freq: Three times a day (TID) | ORAL | Status: DC

## 2015-11-21 MED ORDER — DICYCLOMINE HCL 10 MG PO CAPS
10.0000 mg | ORAL_CAPSULE | Freq: Once | ORAL | Status: AC
  Administered 2015-11-21: 10 mg via ORAL
  Filled 2015-11-21: qty 1

## 2015-11-21 MED ORDER — FAMOTIDINE 20 MG PO TABS: 20.0000 mg | ORAL_TABLET | Freq: Two times a day (BID) | ORAL | Status: DC

## 2015-11-21 MED ORDER — KETOROLAC TROMETHAMINE 30 MG/ML IJ SOLN
INTRAMUSCULAR | Status: AC
  Administered 2015-11-21: 15 mg via INTRAVENOUS
  Filled 2015-11-21: qty 1

## 2015-11-21 MED ORDER — NAPROXEN 500 MG PO TABS: 500.0000 mg | ORAL_TABLET | Freq: Two times a day (BID) | ORAL | Status: DC

## 2015-11-21 MED ORDER — ONDANSETRON HCL 4 MG/2ML IJ SOLN
4.0000 mg | Freq: Once | INTRAMUSCULAR | Status: AC
  Administered 2015-11-21: 4 mg via INTRAVENOUS

## 2015-11-21 MED ORDER — DEXTROSE 5 % AND 0.9 % NACL IV BOLUS
1000.0000 mL | Freq: Once | INTRAVENOUS | Status: AC
  Administered 2015-11-21: 1000 mL via INTRAVENOUS
  Filled 2015-11-21: qty 1000

## 2015-11-21 MED ORDER — DICYCLOMINE HCL 10 MG PO CAPS
ORAL_CAPSULE | ORAL | Status: AC
  Filled 2015-11-21: qty 1

## 2015-11-21 MED ORDER — KETOROLAC TROMETHAMINE 30 MG/ML IJ SOLN
15.0000 mg | INTRAMUSCULAR | Status: AC
  Administered 2015-11-21: 15 mg via INTRAVENOUS

## 2015-11-21 NOTE — ED Notes (Signed)
Patient given crackers and ginger ale. 

## 2015-11-21 NOTE — ED Notes (Signed)
Pt discharged to home.  Family member driving.  Discharge instructions reviewed.  Verbalized understanding.  No questions or concerns at this time.  Teach back verified.  Pt in NAD.  No items left in ED.   

## 2015-11-21 NOTE — ED Provider Notes (Signed)
[facility] Emergency Department Provider Note  ____________________________________________  Time seen: 4:30 PM  I have reviewed the triage vital signs and the nursing notes.   HISTORY  Chief Complaint Emesis and Headache    HPI Kaitlyn Bray is a 50 y.o. female who complains of multiple episodes of vomiting today. She's been at the beach for the last week with friends last night she had [employer] and chicken [place]. This morning she had a bacon egg and cheese biscuit from [employer]. Right after breakfast, they started driving back to [place] from the beach. During the ride she became nauseated and started vomiting. She vomited approximately 4 or 5 times during the drive. She has not been able to tolerate anything by mouth since then. She also notes that she normally drinks a lot of caffeinated beverages daily, and she has not had any since yesterday and is afraid that she is having some caffeine withdrawal headache with a bandlike pressure around her head.  She also has a history of motion sickness that she's noticed with boating.  No fever. No dizziness or syncope. No chest pain or shortness of breath. No abdominal pain after eating.   Past Medical History  Diagnosis Date  . Hypertension   . Hernia   . Asthma      There are no active problems to display for this patient.    Past Surgical History  Procedure Laterality Date  . Hernia repair    . Cesarean section    . Myomectomy    . Abdominal hysterectomy       Current Outpatient Rx  Name  Route  Sig  Dispense  Refill  . benzonatate (TESSALON) 200 MG capsule   Oral   Take 1 capsule (200 mg total) by mouth 3 (three) times daily as needed for cough. Patient not taking: Reported on 08/02/2015   30 capsule   0   . clindamycin (CLEOCIN) 300 MG capsule   Oral   Take 1 capsule (300 mg total) by mouth 3 (three) times daily. Patient not taking: Reported on 08/02/2015   21 capsule   0   . docusate sodium (COLACE) 100 MG capsule   Oral   Take 1 capsule (100 mg total) by mouth every 12 (twelve) hours.   60 capsule   0   . famotidine (PEPCID) 20 MG tablet   Oral   Take 1 tablet (20 mg total) by mouth 2 (two) times daily.   60 tablet   0   . fluconazole (DIFLUCAN) 150 MG tablet   Oral   Take 1 tablet (150 mg total) by mouth daily. Repeat dose in 3 days Patient not taking: Reported on 08/02/2015   2 tablet   0   . fluticasone (FLONASE) 50 MCG/ACT nasal spray   Each Nare   Place 2 sprays into both nostrils at bedtime. Patient not taking: Reported on 08/02/2015   16 g   0   . ibuprofen (ADVIL,MOTRIN) 200 MG tablet   Oral   Take 200 mg by mouth every 6 (six) hours as needed. pain         . ipratropium (ATROVENT) 0.06 % nasal spray   Each Nare   Place 2 sprays into both nostrils 4 (four) times daily. Patient not taking: Reported on 08/02/2015   15 mL   12   . metoCLOPramide (REGLAN) 10 MG tablet   Oral   Take 1 tablet (10 mg total) by mouth 4 (four)  times daily -  before meals and at bedtime.   60 tablet   0   . metoprolol (LOPRESSOR) 50 MG tablet   Oral   Take 50 mg by mouth 2 (two) times daily.      0   . mupirocin ointment (BACTROBAN) 2 %      Apply to skin bid Patient not taking: Reported on 08/02/2015   22 g   0   . naproxen (NAPROSYN) 500 MG tablet   Oral   Take 1 tablet (500 mg total) by mouth 2 (two) times daily with a meal.   20 tablet   0   . ondansetron (ZOFRAN) 4 MG tablet   Oral   Take 1 tablet (4 mg total) by mouth every 6 (six) hours. Patient not taking: Reported on 08/02/2015   12 tablet   0      Allergies Codeine; Penicillins; and Sulfa drugs cross reactors   Family History  Problem Relation Age of Onset  . Hypertension Mother   . Stroke Mother   . Dementia Mother   . COPD Father     Social History Social History  Substance Use Topics  . Smoking status: Never Smoker   . Smokeless tobacco: None  . Alcohol  Use: No    Review of Systems  Constitutional:   No fever or chills.  ENT:   No sore throat. No rhinorrhea. Cardiovascular:   No chest pain. Respiratory:   No dyspnea or cough. Gastrointestinal:   Negative for abdominal pain,Positive vomiting. No diarrhea.  Genitourinary:   Negative for dysuria or difficulty urinating. Musculoskeletal:   Negative for focal pain or swelling Neurological:  Positive for generalized headache, not thunderclap 10-point ROS otherwise negative.  ____________________________________________   PHYSICAL EXAM:  VITAL SIGNS: ED Triage Vitals  Enc Vitals Group     BP 11/21/15 1516 161/89 mmHg     Pulse Rate 11/21/15 1516 68     Resp 11/21/15 1516 20     Temp 11/21/15 1516 97.6 F (36.4 C)     Temp Source 11/21/15 1516 Oral     SpO2 11/21/15 1516 97 %     Weight 11/21/15 1516 230 lb (104.327 kg)     Height 11/21/15 1516 5\' 5"  (1.651 m)     Head Cir --      Peak Flow --      Pain Score 11/21/15 1522 6     Pain Loc --      Pain Edu? --      Excl. in GC? --     Vital signs reviewed, nursing assessments reviewed.   Constitutional:   Alert and oriented. Well appearing and in no distress. Eyes:   No scleral icterus. No conjunctival pallor. PERRL. EOMI.  No nystagmus. ENT   Head:   Normocephalic and atraumatic.TMs normal bilaterally. External canals are somewhat erythematous with no cerumen. Patient does report that she uses Q-tips   Nose:   No congestion/rhinnorhea. No septal hematoma   Mouth/Throat:   MMM, no pharyngeal erythema. No peritonsillar mass.    Neck:   No stridor. No SubQ emphysema. No meningismus. Hematological/Lymphatic/Immunilogical:   No cervical lymphadenopathy. Cardiovascular:   RRR. Symmetric bilateral radial and DP pulses.  No murmurs.  Respiratory:   Normal respiratory effort without tachypnea nor retractions. Breath sounds are clear and equal bilaterally. No wheezes/rales/rhonchi. Gastrointestinal:   Soft and  nontender. Non distended. There is no CVA tenderness.  No rebound, rigidity, or guarding. Genitourinary:   deferred  Musculoskeletal:   Nontender with normal range of motion in all extremities. No joint effusions.  No lower extremity tenderness.  No edema. Neurologic:   Normal speech and language.  CN 2-10 normal. Motor grossly intact. No gross focal neurologic deficits are appreciated.  Skin:    Skin is warm, dry and intact. No rash noted.  No petechiae, purpura, or bullae.  ____________________________________________    LABS (pertinent positives/negatives) (all labs ordered are listed, but only abnormal results are displayed) Labs Reviewed  COMPREHENSIVE METABOLIC PANEL - Abnormal; Notable for the following:    Glucose, Bld 120 (*)    All other components within normal limits  URINALYSIS COMPLETEWITH MICROSCOPIC ([facility] ONLY) - Abnormal; Notable for the following:    Color, Urine YELLOW (*)    APPearance CLEAR (*)    Squamous Epithelial / LPF 0-5 (*)    All other components within normal limits  LIPASE, BLOOD  CBC   ____________________________________________   EKG    ____________________________________________    RADIOLOGY    ____________________________________________   PROCEDURES   ____________________________________________   INITIAL IMPRESSION / ASSESSMENT AND PLAN / ED COURSE  Pertinent labs & imaging results that were available during my care of the patient were reviewed by me and considered in my medical decision making (see chart for details).  Patient well appearing no acute distress. Presentation consistent with food poisoning versus motion sickness. Physical exam is reassuring. Considering the patient's symptoms, medical history, and physical examination today, I have low suspicion for cholecystitis or biliary pathology, pancreatitis, perforation or bowel obstruction, hernia, intra-abdominal abscess, AAA or dissection, volvulus or  intussusception, mesenteric ischemia, or appendicitis.  IV fluids and Zofran. Bentyl. With hydration, and ability to tolerate oral intake, patient will be suitable for discharge home. Labs unremarkable.  ----------------------------------------- 7:01 PM on 11/21/2015 -----------------------------------------  Vital signs remained normal. Tolerating oral intake. Given Toradol for headache. We'll discharge home with symptomatic management.      ____________________________________________   FINAL CLINICAL IMPRESSION(S) / ED DIAGNOSES  Final diagnoses:  Non-intractable vomiting with nausea, vomiting of unspecified type  Food poisoning  Acute nonintractable headache, unspecified headache type       Portions of this note were generated with dragon dictation software. Dictation errors may occur despite best attempts at proofreading.   Sharman CheekPhillip Seraiah Nowack, MD 11/21/15 1901

## 2015-11-21 NOTE — ED Notes (Signed)
Patient in restroom.

## 2015-11-21 NOTE — ED Notes (Signed)
Pt states after eating breakfast this morning she has been throwing up almost every hour. Vomited more than 5 times.  Denies any diarrhea. C/o headache going down the back of her neck.

## 2015-11-21 NOTE — Discharge Instructions (Signed)
Food Poisoning Food poisoning is an illness caused by something you ate or drank. There are over 250 known causes of food poisoning. However, many other causes are unknown.You can be treated even if the exact cause of your food poisoning is not known. In most cases, food poisoning is mild and lasts 1 to 2 days. However, some cases can be serious, especially for people with low immune systems, the elderly, children and infants, and pregnant women. CAUSES  Poor personal hygiene, improper cleaning of storage and preparation areas, and unclean utensils can cause infection or tainting (contamination) of foods. The causes of food poisoning are numerous.Infectious agents, such as viruses, bacteria, or parasites, can cause harm by infecting the intestine and disrupting the absorption of nutrients and water. This can cause diarrhea and lead to dehydration. Viruses are responsible for most of the food poisonings in which an agent is found. Parasites are less likely to cause food poisoning. Toxic agents, such as poisonous mushrooms, marine algae, and pesticides can also cause food poisoning.  Viral causes of food poisoning include:  Norovirus.  Rotavirus.  Hepatitis A.  Bacterial causes of food poisoning include:  Salmonellae.  Campylobacter.  Bacillus cereus.  Escherichia coli (E. coli).  Shigella.  Listeria monocytogenes.  Clostridium botulinum (botulism).  Vibrio cholerae.  Parasites that can cause food poisoning include:  Giardia.  Cryptosporidium.  Toxoplasma. SYMPTOMS Symptoms may appear several hours or longer after consuming the contaminated food or drink. Symptoms may include:  Nausea.  Vomiting.  Cramping.  Diarrhea.  Fever and chills.  Muscle aches. DIAGNOSIS Your health care provider may be able to diagnose food poisoning from a list of what you have recently eaten and results from lab tests. Diagnostic tests may include an exam of the feces. TREATMENT In  most cases, treatment focuses on helping to relieve your symptoms and staying well hydrated. Antibiotic medicines are rarely needed. In severe cases, hospitalization may be required. HOME CARE INSTRUCTIONS   Drink enough water and fluids to keep your urine clear or pale yellow. Drink small amounts of fluids frequently and increase as tolerated.  Ask your health care provider for specific rehydration instructions.  Avoid:  Foods high in sugar.  Alcohol.  Carbonated drinks.  Tobacco.  Juice.  Caffeine drinks.  Extremely hot or cold fluids.  Fatty, greasy foods.  Too much intake of anything at one time.  Dairy products until 24 to 48 hours after diarrhea stops.  You may consume probiotics. Probiotics are active cultures of beneficial bacteria. They may lessen the amount and number of diarrheal stools in adults. Probiotics can be found in yogurt with active cultures and in supplements.  Wash your hands well to avoid spreading the bacteria.  Take medicines only as directed by your health care provider. Do not give your child aspirin because of the association with Reye's syndrome.  Ask your health care provider if you should continue to take your regular prescribed and over-the-counter medicines. PREVENTION   Wash your hands, food preparation surfaces, and utensils thoroughly before and after handling raw foods.  Keep refrigerated foods below 63F (5C).  Serve hot foods immediately or keep them heated above 163F (60C).  Divide large volumes of food into small portions for rapid cooling in the refrigerator. Hot, bulky foods in the refrigerator can raise the temperature of other foods that have already cooled.  Follow approved canning procedures.  Heat canned foods thoroughly before tasting.  When in doubt, throw it out.  Infants, the elderly, women  who are pregnant, and people with compromised immune systems are especially susceptible to food poisoning. These people  should never consume unpasteurized cheese, unpasteurized cider, raw fish, raw seafood, or raw meat-type products. SEEK IMMEDIATE MEDICAL CARE IF:   You have difficulty breathing, swallowing, talking, or moving.  You develop blurred vision.  You are unable to keep fluids down.  You faint or nearly faint.  Your eyes turn yellow.  Vomiting or diarrhea develops or becomes persistent.  Abdominal pain develops, increases, or localizes in one small area.  You have a fever.  The diarrhea becomes excessive or contains blood or mucus.  You develop excessive weakness, dizziness, or extreme thirst.  You have no urine for 8 hours. MAKE SURE YOU:   Understand these instructions.  Will watch your condition.  Will get help right away if you are not doing well or get worse.   This information is not intended to replace advice given to you by your health care provider. Make sure you discuss any questions you have with your health care provider.   Document Released: 01/28/2004 Document Revised: 05/22/2014 Document Reviewed: 11/02/2014 Elsevier Interactive Patient Education 2016 [employer]  General Headache Without Cause A headache is pain or discomfort felt around the head or neck area. There are many causes and types of headaches. In some cases, the cause may not be found.  HOME CARE  Managing Pain  Take over-the-counter and prescription medicines only as told by your doctor.  Lie down in a dark, quiet room when you have a headache.  If directed, apply ice to the head and neck area:  Put ice in a plastic bag.  Place a towel between your skin and the bag.  Leave the ice on for 20 minutes, 2-3 times per day.  Use a heating pad or hot shower to apply heat to the head and neck area as told by your doctor.  Keep lights dim if bright lights bother you or make your headaches worse. Eating and Drinking  Eat meals on a regular schedule.  Lessen how much alcohol you  drink.  Lessen how much caffeine you drink, or stop drinking caffeine. General Instructions  Keep all follow-up visits as told by your doctor. This is important.  Keep a journal to find out if certain things bring on headaches. For example, write down:  What you eat and drink.  How much sleep you get.  Any change to your diet or medicines.  Relax by getting a massage or doing other relaxing activities.  Lessen stress.  Sit up straight. Do not tighten (tense) your muscles.  Do not use tobacco products. This includes cigarettes, chewing tobacco, or e-cigarettes. If you need help quitting, ask your doctor.  Exercise regularly as told by your doctor.  Get enough sleep. This often means 7-9 hours of sleep. GET HELP IF:  Your symptoms are not helped by medicine.  You have a headache that feels different than the other headaches.  You feel sick to your stomach (nauseous) or you throw up (vomit).  You have a fever. GET HELP RIGHT AWAY IF:   Your headache becomes really bad.  You keep throwing up.  You have a stiff neck.  You have trouble seeing.  You have trouble speaking.  You have pain in the eye or ear.  Your muscles are weak or you lose muscle control.  You lose your balance or have trouble walking.  You feel like you will pass out (faint) or you  pass out.  You have confusion.   This information is not intended to replace advice given to you by your health care provider. Make sure you discuss any questions you have with your health care provider.   Document Released: 02/08/2008 Document Revised: 01/20/2015 Document Reviewed: 08/24/2014 Elsevier Interactive Patient Education 2016 [employer]  Nausea and Vomiting Nausea is a sick feeling that often comes before throwing up (vomiting). Vomiting is a reflex where stomach contents come out of your mouth. Vomiting can cause severe loss of body fluids (dehydration). Children and elderly adults can become  dehydrated quickly, especially if they also have diarrhea. Nausea and vomiting are symptoms of a condition or disease. It is important to find the cause of your symptoms. CAUSES   Direct irritation of the stomach lining. This irritation can result from increased acid production (gastroesophageal reflux disease), infection, food poisoning, taking certain medicines (such as nonsteroidal anti-inflammatory drugs), alcohol use, or tobacco use.  Signals from the brain.These signals could be caused by a headache, heat exposure, an inner ear disturbance, increased pressure in the brain from injury, infection, a tumor, or a concussion, pain, emotional stimulus, or metabolic problems.  An obstruction in the gastrointestinal tract (bowel obstruction).  Illnesses such as diabetes, hepatitis, gallbladder problems, appendicitis, kidney problems, cancer, sepsis, atypical symptoms of a heart attack, or eating disorders.  Medical treatments such as chemotherapy and radiation.  Receiving medicine that makes you sleep (general anesthetic) during surgery. DIAGNOSIS Your caregiver may ask for tests to be done if the problems do not improve after a few days. Tests may also be done if symptoms are severe or if the reason for the nausea and vomiting is not clear. Tests may include:  Urine tests.  Blood tests.  Stool tests.  Cultures (to look for evidence of infection).  X-rays or other imaging studies. Test results can help your caregiver make decisions about treatment or the need for additional tests. TREATMENT You need to stay well hydrated. Drink frequently but in small amounts.You may wish to drink water, sports drinks, clear broth, or eat frozen ice pops or gelatin dessert to help stay hydrated.When you eat, eating slowly may help prevent nausea.There are also some antinausea medicines that may help prevent nausea. HOME CARE INSTRUCTIONS   Take all medicine as directed by your caregiver.  If you do  not have an appetite, do not force yourself to eat. However, you must continue to drink fluids.  If you have an appetite, eat a normal diet unless your caregiver tells you differently.  Eat a variety of complex carbohydrates (rice, wheat, potatoes, bread), lean meats, yogurt, fruits, and vegetables.  Avoid high-fat foods because they are more difficult to digest.  Drink enough water and fluids to keep your urine clear or pale yellow.  If you are dehydrated, ask your caregiver for specific rehydration instructions. Signs of dehydration may include:  Severe thirst.  Dry lips and mouth.  Dizziness.  Dark urine.  Decreasing urine frequency and amount.  Confusion.  Rapid breathing or pulse. SEEK IMMEDIATE MEDICAL CARE IF:   You have blood or brown flecks (like coffee grounds) in your vomit.  You have black or bloody stools.  You have a severe headache or stiff neck.  You are confused.  You have severe abdominal pain.  You have chest pain or trouble breathing.  You do not urinate at least once every 8 hours.  You develop cold or clammy skin.  You continue to vomit for longer than  24 to 48 hours.  You have a fever. MAKE SURE YOU:   Understand these instructions.  Will watch your condition.  Will get help right away if you are not doing well or get worse.   This information is not intended to replace advice given to you by your health care provider. Make sure you discuss any questions you have with your health care provider.   Document Released: 05/01/2005 Document Revised: 07/24/2011 Document Reviewed: 09/28/2010 Elsevier Interactive Patient Education [employer]

## 2015-11-23 ENCOUNTER — Encounter (HOSPITAL_COMMUNITY): Payer: Self-pay

## 2015-11-23 ENCOUNTER — Emergency Department (HOSPITAL_COMMUNITY): Payer: Self-pay

## 2015-11-23 ENCOUNTER — Emergency Department (HOSPITAL_COMMUNITY)
Admission: EM | Admit: 2015-11-23 | Discharge: 2015-11-23 | Disposition: A | Discharge status: Home / Self Care | Payer: Self-pay | Attending: Emergency Medicine | Admitting: Emergency Medicine

## 2015-11-23 DIAGNOSIS — R519 Headache, unspecified: Secondary | ICD-10-CM

## 2015-11-23 DIAGNOSIS — G8929 Other chronic pain: Secondary | ICD-10-CM

## 2015-11-23 DIAGNOSIS — I10 Essential (primary) hypertension: Secondary | ICD-10-CM | POA: Insufficient documentation

## 2015-11-23 DIAGNOSIS — J45909 Unspecified asthma, uncomplicated: Secondary | ICD-10-CM | POA: Insufficient documentation

## 2015-11-23 DIAGNOSIS — R51 Headache: Secondary | ICD-10-CM | POA: Insufficient documentation

## 2015-11-23 LAB — I-STAT CHEM 8, ED
BUN: 10 mg/dL (ref 6–20)
Calcium, Ion: 1.03 mmol/L — ABNORMAL LOW (ref 1.13–1.30)
Chloride: 104 mmol/L (ref 101–111)
Creatinine, Ser: 0.6 mg/dL (ref 0.44–1.00)
Glucose, Bld: 87 mg/dL (ref 65–99)
HCT: 38 % (ref 36.0–46.0)
Hemoglobin: 12.9 g/dL (ref 12.0–15.0)
Potassium: 3.6 mmol/L (ref 3.5–5.1)
Sodium: 138 mmol/L (ref 135–145)
TCO2: 24 mmol/L (ref 0–100)

## 2015-11-23 MED ORDER — SODIUM CHLORIDE 0.9 % IV BOLUS (SEPSIS)
500.0000 mL | Freq: Once | INTRAVENOUS | Status: AC
  Administered 2015-11-23: 500 mL via INTRAVENOUS

## 2015-11-23 MED ORDER — BUTALBITAL-APAP-CAFFEINE 50-325-40 MG PO TABS: 1.0000 | ORAL_TABLET | Freq: Four times a day (QID) | ORAL | Status: DC | PRN

## 2015-11-23 MED ORDER — DIPHENHYDRAMINE HCL 50 MG/ML IJ SOLN
12.5000 mg | Freq: Once | INTRAMUSCULAR | Status: AC
  Administered 2015-11-23: 12.5 mg via INTRAVENOUS
  Filled 2015-11-23: qty 1

## 2015-11-23 MED ORDER — PROCHLORPERAZINE EDISYLATE 5 MG/ML IJ SOLN
10.0000 mg | Freq: Once | INTRAMUSCULAR | Status: AC
  Administered 2015-11-23: 10 mg via INTRAVENOUS
  Filled 2015-11-23: qty 2

## 2015-11-23 NOTE — ED Notes (Signed)
Patient here with ongoing headache since Sunday. [facility] and was treated for headache and vomiting. Has ongoing occipital headache but nausea/vomiting has resolved. Alert and oriented.

## 2015-11-23 NOTE — ED Provider Notes (Signed)
CSN: [id number]     Arrival date & time 11/23/15  [id number] History   First MD Initiated Contact with Patient 11/23/15 1154     Chief Complaint  Patient presents with  . Headache   HPI The patient presents to the emergency room with complaints of a headache that started on Sunday. Headache initially started gradually in onset on Sunday. She had been out the beach. She started having several episodes of nausea and vomiting associated with her headache. Headache was primarily located in the posterior aspect of her head. Patient's symptoms initially started with nausea and vomiting and then she started developing a headache after that. She was seen at [facility] on Sunday and they attributed her to symptoms to a possible food poisoning. Patient's symptoms improved and she was released. Patient states last couple days she started having a headache again. It's in the posterior aspect of her head towards her neck. She does have a history of headaches and has been treated in the emergency department in the past for headaches although they never specifically recalled migraines. Patient denies any numbness or weakness. No fevers or chills. No myalgias or rashes. Her mother has a history of a hemorrhagic stroke so the patient was concerned that this might be going on since her headache is persisting. Past Medical History  Diagnosis Date  . Hypertension   . Hernia   . Asthma    Past Surgical History  Procedure Laterality Date  . Hernia repair    . Cesarean section    . Myomectomy    . Abdominal hysterectomy     Family History  Problem Relation Age of Onset  . Hypertension Mother   . Stroke Mother   . Dementia Mother   . COPD Father    Social History  Substance Use Topics  . Smoking status: Never Smoker   . Smokeless tobacco: None  . Alcohol Use: No   OB History    No data available     Review of Systems  All other systems reviewed and are negative.     Allergies  Codeine;  Penicillins; and Sulfa drugs cross reactors  Home Medications   Prior to Admission medications   Medication Sig Start Date End Date Taking? Authorizing Provider  famotidine (PEPCID) 20 MG tablet Take 1 tablet (20 mg total) by mouth 2 (two) times daily. 11/21/15  Yes Sharman CheekPhillip Stafford, MD  ibuprofen (ADVIL,MOTRIN) 200 MG tablet Take 200 mg by mouth every 6 (six) hours as needed. pain   Yes Historical Provider, MD  metoprolol (LOPRESSOR) 50 MG tablet Take 50 mg by mouth 2 (two) times daily. 07/13/15  Yes Historical Provider, MD  butalbital-acetaminophen-caffeine (FIORICET) 50-325-40 MG tablet Take 1 tablet by mouth every 6 (six) hours as needed for headache. 11/23/15 11/22/16  Linwood DibblesJon Merilyn Pagan, MD   BP 161/96 mmHg  Pulse 59  Temp(Src) 98.2 F (36.8 C) (Oral)  Resp 14  SpO2 99% Physical Exam  Constitutional: She appears well-developed and well-nourished. No distress.  HENT:  Head: Normocephalic and atraumatic.  Right Ear: External ear normal.  Left Ear: External ear normal.  Eyes: Conjunctivae are normal. Right eye exhibits no discharge. Left eye exhibits no discharge. No scleral icterus.  Neck: Normal range of motion. Neck supple. No tracheal deviation present.  Cardiovascular: Normal rate, regular rhythm and intact distal pulses.   Pulmonary/Chest: Effort normal and breath sounds normal. No stridor. No respiratory distress. She has no wheezes. She has no rales.  Abdominal: Soft. Bowel  sounds are normal. She exhibits no distension. There is no tenderness. There is no rebound and no guarding.  Musculoskeletal: She exhibits no edema or tenderness.  Neurological: She is alert. She has normal strength. No cranial nerve deficit (no facial droop, extraocular movements intact, no slurred speech) or sensory deficit. She exhibits normal muscle tone. She displays no seizure activity. Coordination normal.  Skin: Skin is warm and dry. No rash noted.  Psychiatric: She has a normal mood and affect.  Nursing note  and vitals reviewed.   ED Course  Procedures (including critical care time) Labs Review Labs Reviewed  I-STAT CHEM 8, ED - Abnormal; Notable for the following:    Calcium, Ion 1.03 (*)    All other components within normal limits    Imaging Review Ct Head Wo Contrast  11/23/2015  CLINICAL DATA:  Posterior headache and neck tightness. EXAM: CT HEAD WITHOUT CONTRAST TECHNIQUE: Contiguous axial images were obtained from the base of the skull through the vertex without intravenous contrast. COMPARISON:  None. FINDINGS: Normal appearing cerebral hemispheres and posterior fossa structures. Normal size and position of the ventricles. No intracranial hemorrhage, mass lesion or CT evidence of acute infarction. Unremarkable bones and included paranasal sinuses. IMPRESSION: Normal examination. Electronically Signed   By: Beckie Salts M.D.   On: 11/23/2015 12:32   Medications given in the ED Medications  sodium chloride 0.9 % bolus 500 mL (500 mLs Intravenous New Bag/Given 11/23/15 1301)  prochlorperazine (COMPAZINE) injection 10 mg (10 mg Intravenous Given 11/23/15 1301)  diphenhydrAMINE (BENADRYL) injection 12.5 mg (12.5 mg Intravenous Given 11/23/15 1301)     I have personally reviewed and evaluated these images and lab results as part of my medical decision-making.   MDM   Final diagnoses:  Chronic nonintractable headache, unspecified headache type    The patient's CT scan does not show any evidence of any hemorrhagic stroke. Her i-STAT is unremarkable.  Sx are not suggestive of SAH or meningitis.  I suspect her headache is related to migraine type headache.  Discussed findings with patient.  She is feeling better and ready to go home.    Linwood Dibbles, MD 11/23/15 1330

## 2015-11-23 NOTE — Discharge Instructions (Signed)

## 2016-03-28 ENCOUNTER — Ambulatory Visit (HOSPITAL_COMMUNITY)
Admission: EM | Admit: 2016-03-28 | Discharge: 2016-03-28 | Disposition: A | Discharge status: Home / Self Care | Payer: Self-pay | Attending: Family Medicine | Admitting: Family Medicine

## 2016-03-28 ENCOUNTER — Encounter (HOSPITAL_COMMUNITY): Payer: Self-pay | Admitting: Emergency Medicine

## 2016-03-28 DIAGNOSIS — R0982 Postnasal drip: Secondary | ICD-10-CM

## 2016-03-28 DIAGNOSIS — T700XXA Otitic barotrauma, initial encounter: Secondary | ICD-10-CM

## 2016-03-28 DIAGNOSIS — R05 Cough: Secondary | ICD-10-CM

## 2016-03-28 DIAGNOSIS — R059 Cough, unspecified: Secondary | ICD-10-CM

## 2016-03-28 DIAGNOSIS — H6983 Other specified disorders of Eustachian tube, bilateral: Secondary | ICD-10-CM

## 2016-03-28 NOTE — Discharge Instructions (Signed)
The following medications should help with the cough and drainage which are contributing to your pain and congestion. Your lungs are clear, do not hear any wheezing or suspected asthma is a problem at this time. No signs of pneumonia. Sudafed 10 mg every 4 hours as needed for congestion Allegra or Zyrtec daily as needed for drainage and runny nose. For stronger antihistamine may take Chlor-Trimeton 2 to 4 mg every 4 to 6 hours, may cause drowsiness. Saline nasal spray used frequently. Ibuprofen 600 mg every 6 hours as needed for pain, discomfort or fever. Drink plenty of fluids and stay well-hydrated. Flonase or Rhinocort nasal spray daily

## 2016-03-28 NOTE — ED Triage Notes (Signed)
[facility] with a complaint of a cough x 1 week. The patient reported that she has had a cough that she described as productive x 1 week. The patient reported that she has been using OTC cough medicines.

## 2016-03-28 NOTE — ED Provider Notes (Signed)
CSN: [id number]     Arrival date & time 03/28/16  1459 History   First MD Initiated Contact with Patient 03/28/16 1630     Chief Complaint  Patient presents with  . Cough   (Consider location/radiation/quality/duration/timing/severity/associated sxs/prior Treatment) 50 year old female complaining of a cough for a week. Associated with PND, runny nose. She states she has a history of asthma but she does not seem to be bothered by that. Her cough is noisy that is originating from her larynx. One day she had fever but did not measure it. No current fevers. She does not use a HFA inhaler. She has been using guaifenesin and dextromethorphan with little to no help. Denies chest pain or shortness of breath.      Past Medical History:  Diagnosis Date  . Asthma   . Hernia   . Hypertension    Past Surgical History:  Procedure Laterality Date  . ABDOMINAL HYSTERECTOMY    . CESAREAN SECTION    . HERNIA REPAIR    . MYOMECTOMY     Family History  Problem Relation Age of Onset  . Hypertension Mother   . Stroke Mother   . Dementia Mother   . COPD Father    Social History  Substance Use Topics  . Smoking status: Never Smoker  . Smokeless tobacco: Never Used  . Alcohol use No   OB History    No data available     Review of Systems  Constitutional: Positive for fatigue. Negative for activity change, appetite change, chills and fever.  HENT: Positive for congestion, postnasal drip and rhinorrhea. Negative for facial swelling and trouble swallowing.   Eyes: Negative.   Respiratory: Positive for cough. Negative for shortness of breath and wheezing.   Cardiovascular: Negative.   Gastrointestinal: Negative.   Musculoskeletal: Negative for neck pain and neck stiffness.  Skin: Negative for pallor and rash.  Neurological: Negative.     Allergies  Codeine; Penicillins; and Sulfa drugs cross reactors  Home Medications   Prior to Admission medications   Medication Sig Start Date  End Date Taking? Authorizing Provider  metoprolol (LOPRESSOR) 50 MG tablet Take 50 mg by mouth 2 (two) times daily. 07/13/15  Yes Historical Provider, MD  butalbital-acetaminophen-caffeine (FIORICET) 50-325-40 MG tablet Take 1 tablet by mouth every 6 (six) hours as needed for headache. 11/23/15 11/22/16  Linwood DibblesJon Knapp, MD  famotidine (PEPCID) 20 MG tablet Take 1 tablet (20 mg total) by mouth 2 (two) times daily. 11/21/15   Sharman CheekPhillip Stafford, MD  ibuprofen (ADVIL,MOTRIN) 200 MG tablet Take 200 mg by mouth every 6 (six) hours as needed. pain    Historical Provider, MD   Meds Ordered and Administered this Visit  Medications - No data to display  BP 164/92 (BP Location: Left Arm)   Pulse 66   Temp 98.3 F (36.8 C) (Oral)   Resp 18   SpO2 99%  No data found.   Physical Exam  Constitutional: She is oriented to person, place, and time. She appears well-developed and well-nourished. No distress.  HENT:  Head: Normocephalic and atraumatic.  Right Ear: External ear normal.  Left Ear: External ear normal.  Nose: Nose normal.  Mouth/Throat: No oropharyngeal exudate.  Oropharynx with cobblestoning and moderate amount of clear PND. No exudates or swelling. Minor injection.  Lateral TMs are retracted.  Eyes: EOM are normal. Pupils are equal, round, and reactive to light.  Neck: Normal range of motion. Neck supple.  Minor tenderness along the bilateral anterior neck just  below the angle of the jaw following the eustachian tube track. No palpable adenopathy.  Cardiovascular: Normal rate, regular rhythm, normal heart sounds and intact distal pulses.   Pulmonary/Chest: Effort normal and breath sounds normal. No respiratory distress. She has no wheezes. She has no rales.  Musculoskeletal: Normal range of motion. She exhibits no edema.  Lymphadenopathy:    She has no cervical adenopathy.  Neurological: She is alert and oriented to person, place, and time.  Skin: Skin is warm and dry.  Psychiatric: She has a  normal mood and affect.  Nursing note and vitals reviewed.   Urgent Care Course   Clinical Course     Procedures (including critical care time)  Labs Review Labs Reviewed - No data to display  Imaging Review No results found.   Visual Acuity Review  Right Eye Distance:   Left Eye Distance:   Bilateral Distance:    Right Eye Near:   Left Eye Near:    Bilateral Near:         MDM   1. Cough   2. PND (post-nasal drip)   3. Barotitis media, initial encounter   4. ETD (Eustachian tube dysfunction), bilateral    The following medications should help with the cough and drainage which are contributing to your pain and congestion. Your lungs are clear, do not hear any wheezing or suspected asthma is a problem at this time. No signs of pneumonia. Sudafed 10 mg every 4 hours as needed for congestion Allegra or Zyrtec daily as needed for drainage and runny nose. For stronger antihistamine may take Chlor-Trimeton 2 to 4 mg every 4 to 6 hours, may cause drowsiness. Saline nasal spray used frequently. Ibuprofen 600 mg every 6 hours as needed for pain, discomfort or fever. Drink plenty of fluids and stay well-hydrated. Flonase or Rhinocort nasal spray daily    Hayden Rasmussenavid Milah Recht, NP 03/28/16 1655

## 2016-10-16 ENCOUNTER — Ambulatory Visit (HOSPITAL_COMMUNITY)
Admission: EM | Admit: 2016-10-16 | Discharge: 2016-10-16 | Disposition: A | Discharge status: Home / Self Care | Payer: Self-pay | Attending: Internal Medicine | Admitting: Internal Medicine

## 2016-10-16 ENCOUNTER — Encounter (HOSPITAL_COMMUNITY): Payer: Self-pay | Admitting: Emergency Medicine

## 2016-10-16 DIAGNOSIS — J40 Bronchitis, not specified as acute or chronic: Secondary | ICD-10-CM

## 2016-10-16 LAB — POCT URINALYSIS DIP (DEVICE)
Bilirubin Urine: NEGATIVE
Glucose, UA: NEGATIVE mg/dL
Hgb urine dipstick: NEGATIVE
Ketones, ur: NEGATIVE mg/dL
Leukocytes, UA: NEGATIVE
Nitrite: NEGATIVE
Protein, ur: NEGATIVE mg/dL
Specific Gravity, Urine: 1.015 (ref 1.005–1.030)
Urobilinogen, UA: 0.2 mg/dL (ref 0.0–1.0)
pH: 8.5 — ABNORMAL HIGH (ref 5.0–8.0)

## 2016-10-16 MED ORDER — PREDNISONE 10 MG (21) PO TBPK: ORAL_TABLET | Freq: Every day | ORAL | 0 refills | Status: DC

## 2016-10-16 MED ORDER — ALBUTEROL SULFATE HFA 108 (90 BASE) MCG/ACT IN AERS: 2.0000 | INHALATION_SPRAY | RESPIRATORY_TRACT | 1 refills | Status: DC | PRN

## 2016-10-16 NOTE — ED Provider Notes (Signed)
[facility]    CSN: [id number] Arrival date & time: 10/16/16  [id number]     History   Chief Complaint Chief Complaint  Patient presents with  . Cough    HPI Kaitlyn Bray is a 51 y.o. female.   Pt c/o cough and chest congestion x2 days.  Says she has "burning across top of her chest". She admits to nasal drainage but denies fever or headaches. "Feels drained".  Also admits to "overactive bladder" lately. Denies chest pain or SOB but admits that of her back hurts "across the middle" when she coughs.  States that she used to have asthma but has not had inhalers in a long time due to finances.       Past Medical History:  Diagnosis Date  . Asthma   . Hernia   . Hypertension     There are no active problems to display for this patient.   Past Surgical History:  Procedure Laterality Date  . ABDOMINAL HYSTERECTOMY    . CESAREAN SECTION    . HERNIA REPAIR    . MYOMECTOMY      OB History    No data available       Home Medications    Prior to Admission medications   Medication Sig Start Date End Date Taking? Authorizing Provider  ibuprofen (ADVIL,MOTRIN) 200 MG tablet Take 200 mg by mouth every 6 (six) hours as needed. pain   Yes [provider]  metoprolol (LOPRESSOR) 50 MG tablet Take 50 mg by mouth 2 (two) times daily. 07/13/15  Yes [provider]  albuterol (PROVENTIL HFA;VENTOLIN HFA) 108 (90 Base) MCG/ACT inhaler Inhale 2 puffs into the lungs every 4 (four) hours as needed for wheezing or shortness of breath. 10/16/16   Arnaldo Nataliamond, Nykolas Bacallao S, MD  predniSONE (STERAPRED UNI-PAK 21 TAB) 10 MG (21) TBPK tablet Take by mouth daily. Take 6 tabs by mouth daily  for 2 days, then 5 tabs for 2 days, then 4 tabs for 2 days, then 3 tabs for 2 days, 2 tabs for 2 days, then 1 tab by mouth daily for 2 days 10/16/16   Arnaldo Nataliamond, Dustie Brittle S, MD    Family History Family History  Problem Relation Age of Onset  . Hypertension Mother   . Stroke Mother   .  Dementia Mother   . COPD Father     Social History Social History  Substance Use Topics  . Smoking status: Never Smoker  . Smokeless tobacco: Never Used  . Alcohol use No     Allergies   Codeine; Penicillins; and Sulfa drugs cross reactors   Review of Systems Review of Systems  Constitutional: Negative for chills and fever.  HENT: Negative for sore throat and tinnitus.   Eyes: Negative for redness.  Respiratory: Positive for cough. Negative for shortness of breath.   Cardiovascular: Negative for chest pain and palpitations.  Gastrointestinal: Negative for abdominal pain, diarrhea, nausea and vomiting.  Genitourinary: Positive for frequency. Negative for dysuria and urgency.  Musculoskeletal: Negative for myalgias.  Skin: Negative for rash.       No lesions  Neurological: Negative for weakness.  Hematological: Does not bruise/bleed easily.  Psychiatric/Behavioral: Negative for suicidal ideas.     Physical Exam Triage Vital Signs ED Triage Vitals  Enc Vitals Group     BP 10/16/16 1021 (!) 144/72     Pulse Rate 10/16/16 1021 61     Resp 10/16/16 1021 16     Temp 10/16/16 1021  98 F (36.7 C)     Temp Source 10/16/16 1021 Oral     SpO2 10/16/16 1021 99 %     Weight --      Height --      Head Circumference --      Peak Flow --      Pain Score 10/16/16 1023 5     Pain Loc --      Pain Edu? --      Excl. in GC? --    No data found.   Updated Vital Signs BP (!) 144/72 (BP Location: Left Arm)   Pulse 61   Temp 98 F (36.7 C) (Oral)   Resp 16   SpO2 99%   Visual Acuity Right Eye Distance:   Left Eye Distance:   Bilateral Distance:    Right Eye Near:   Left Eye Near:    Bilateral Near:     Physical Exam  Constitutional: She is oriented to person, place, and time. She appears well-developed and well-nourished. No distress.  HENT:  Head: Normocephalic and atraumatic.  Mouth/Throat: Oropharynx is clear and moist.  Eyes: Conjunctivae and EOM are  normal. Pupils are equal, round, and reactive to light. No scleral icterus.  Neck: Normal range of motion. Neck supple. No JVD present. No tracheal deviation present. No thyromegaly present.  Cardiovascular: Normal rate, regular rhythm and normal heart sounds.  Exam reveals no gallop and no friction rub.   No murmur heard. Pulmonary/Chest: Effort normal and breath sounds normal.  Abdominal: Soft. Bowel sounds are normal. She exhibits no distension. There is no tenderness.  Musculoskeletal: Normal range of motion. She exhibits no edema.  Lymphadenopathy:    She has no cervical adenopathy.  Neurological: She is alert and oriented to person, place, and time. No cranial nerve deficit.  Skin: Skin is warm and dry.  Psychiatric: She has a normal mood and affect. Her behavior is normal. Judgment and thought content normal.  Nursing note and vitals reviewed.    UC Treatments / Results  Labs (all labs ordered are listed, but only abnormal results are displayed) Labs Reviewed  POCT URINALYSIS DIP (DEVICE) - Abnormal; Notable for the following:       Result Value   pH 8.5 (*)    All other components within normal limits    EKG  EKG Interpretation None       Radiology No results found.  Procedures Procedures (including critical care time)  Medications Ordered in UC Medications - No data to display   Initial Impression / Assessment and Plan / UC Course  I have reviewed the triage vital signs and the nursing notes.  Pertinent labs & imaging results that were available during my care of the patient were reviewed by me and considered in my medical decision making (see chart for details).     Likely bronchitic cough; rx inhaler and steroids. Prescription assistance card given  Final Clinical Impressions(s) / UC Diagnoses   Final diagnoses:  Bronchitis    New Prescriptions New Prescriptions   ALBUTEROL (PROVENTIL HFA;VENTOLIN HFA) 108 (90 BASE) MCG/ACT INHALER    Inhale 2  puffs into the lungs every 4 (four) hours as needed for wheezing or shortness of breath.   PREDNISONE (STERAPRED UNI-PAK 21 TAB) 10 MG (21) TBPK TABLET    Take by mouth daily. Take 6 tabs by mouth daily  for 2 days, then 5 tabs for 2 days, then 4 tabs for 2 days, then 3 tabs for 2  days, 2 tabs for 2 days, then 1 tab by mouth daily for 2 days     Arnaldo Natal, MD 10/16/16 1121

## 2016-10-16 NOTE — ED Triage Notes (Signed)
The patient presented to the [facility] with a complaint of a cough and congestion x 2 days.  

## 2017-01-15 ENCOUNTER — Encounter (HOSPITAL_COMMUNITY): Payer: Self-pay | Admitting: *Deleted

## 2017-01-15 ENCOUNTER — Ambulatory Visit (HOSPITAL_COMMUNITY)
Admission: EM | Admit: 2017-01-15 | Discharge: 2017-01-15 | Disposition: A | Discharge status: Home / Self Care | Payer: Self-pay | Attending: Radiology | Admitting: Radiology

## 2017-01-15 DIAGNOSIS — R300 Dysuria: Secondary | ICD-10-CM

## 2017-01-15 DIAGNOSIS — R05 Cough: Secondary | ICD-10-CM

## 2017-01-15 DIAGNOSIS — B9789 Other viral agents as the cause of diseases classified elsewhere: Secondary | ICD-10-CM

## 2017-01-15 DIAGNOSIS — J069 Acute upper respiratory infection, unspecified: Secondary | ICD-10-CM

## 2017-01-15 LAB — POCT URINALYSIS DIP (DEVICE)
Bilirubin Urine: NEGATIVE
Glucose, UA: NEGATIVE mg/dL
Hgb urine dipstick: NEGATIVE
Ketones, ur: NEGATIVE mg/dL
Leukocytes, UA: NEGATIVE
Nitrite: NEGATIVE
Protein, ur: NEGATIVE mg/dL
Specific Gravity, Urine: >=1.03 (ref 1.005–1.030)
Urobilinogen, UA: 0.2 mg/dL (ref 0.0–1.0)
pH: 6 (ref 5.0–8.0)

## 2017-01-15 MED ORDER — DEXAMETHASONE SODIUM PHOSPHATE 10 MG/ML IJ SOLN
INTRAMUSCULAR | Status: AC
  Filled 2017-01-15: qty 1

## 2017-01-15 MED ORDER — BENZONATATE 100 MG PO CAPS: 100.0000 mg | ORAL_CAPSULE | Freq: Three times a day (TID) | ORAL | 0 refills | Status: DC

## 2017-01-15 MED ORDER — METOPROLOL TARTRATE 50 MG PO TABS: 50.0000 mg | ORAL_TABLET | Freq: Two times a day (BID) | ORAL | 0 refills | Status: DC

## 2017-01-15 MED ORDER — DEXAMETHASONE SODIUM PHOSPHATE 10 MG/ML IJ SOLN
10.0000 mg | Freq: Once | INTRAMUSCULAR | Status: AC
Start: 2017-01-15 — End: 2017-01-15
  Administered 2017-01-15: 10 mg via INTRAMUSCULAR

## 2017-01-15 NOTE — ED Provider Notes (Addendum)
[facility]    CSN: [id number] Arrival date & time: 01/15/17  1008     History   Chief Complaint Chief Complaint  Patient presents with  . Cough    HPI FREDDI FORSTER is a 51 y.o. female.   51 y.o. female presents with URI signs, symptoms, subjective non prodctive cough, Urirany urgency and dysurea X 1 week. Condition is acute in nature. Condition is made better by nothing. Condition is made worse by nothing. Patient denies any relief from ibuprofen prior to there arrival at this facility. Patient denies any nausea, vomiting or diarrhea. patient works in a Copy. Patient states that she is out of her HTN medication Patient states that she is in the process of getting on new PCP       Past Medical History:  Diagnosis Date  . Asthma   . Hernia   . Hypertension     There are no active problems to display for this patient.   Past Surgical History:  Procedure Laterality Date  . ABDOMINAL HYSTERECTOMY    . CESAREAN SECTION    . HERNIA REPAIR    . MYOMECTOMY      OB History    No data available       Home Medications    Prior to Admission medications   Medication Sig Start Date End Date Taking? Authorizing Provider  albuterol (PROVENTIL HFA;VENTOLIN HFA) 108 (90 Base) MCG/ACT inhaler Inhale 2 puffs into the lungs every 4 (four) hours as needed for wheezing or shortness of breath. 10/16/16   Arnaldo Natal, MD  ibuprofen (ADVIL,MOTRIN) 200 MG tablet Take 200 mg by mouth every 6 (six) hours as needed. pain    [provider]  metoprolol (LOPRESSOR) 50 MG tablet Take 50 mg by mouth 2 (two) times daily. 07/13/15   [provider]  predniSONE (STERAPRED UNI-PAK 21 TAB) 10 MG (21) TBPK tablet Take by mouth daily. Take 6 tabs by mouth daily  for 2 days, then 5 tabs for 2 days, then 4 tabs for 2 days, then 3 tabs for 2 days, 2 tabs for 2 days, then 1 tab by mouth daily for 2 days 10/16/16   Arnaldo Natal, MD    Family  History Family History  Problem Relation Age of Onset  . Hypertension Mother   . Stroke Mother   . Dementia Mother   . COPD Father     Social History Social History  Substance Use Topics  . Smoking status: Never Smoker  . Smokeless tobacco: Never Used  . Alcohol use No     Allergies   Codeine; Penicillins; and Sulfa drugs cross reactors   Review of Systems Review of Systems  Constitutional: Negative for chills and fever.  HENT: Positive for rhinorrhea. Negative for ear pain and sore throat.   Eyes: Negative for pain and visual disturbance.  Respiratory: Negative for cough ( non productive) and shortness of breath.   Cardiovascular: Negative for chest pain and palpitations.  Gastrointestinal: Negative for abdominal pain and vomiting.  Genitourinary: Positive for dysuria and urgency. Negative for hematuria.  Musculoskeletal: Negative for arthralgias and back pain.  Skin: Negative for color change and rash.  Neurological: Negative for seizures and syncope.  All other systems reviewed and are negative.    Physical Exam Triage Vital Signs ED Triage Vitals [01/15/17 1116]  Enc Vitals Group     BP (!) 170/98     Pulse Rate 82  Resp 18     Temp 98.6 F (37 C)     Temp Source Oral     SpO2 99 %     Weight      Height      Head Circumference      Peak Flow      Pain Score      Pain Loc      Pain Edu?      Excl. in GC?    No data found.   Updated Vital Signs BP (!) 170/98 (BP Location: Left Arm)   Pulse 82   Temp 98.6 F (37 C) (Oral)   Resp 18   SpO2 99%   Visual Acuity Right Eye Distance:   Left Eye Distance:   Bilateral Distance:    Right Eye Near:   Left Eye Near:    Bilateral Near:     Physical Exam  Constitutional: She is oriented to person, place, and time. She appears well-developed and well-nourished.  HENT:  Head: Normocephalic and atraumatic.  Eyes: Conjunctivae are normal.  Neck: Normal range of motion.  Cardiovascular: Normal  rate and regular rhythm.   Pulmonary/Chest: Effort normal and breath sounds normal.  Neurological: She is alert and oriented to person, place, and time.  Psychiatric: She has a normal mood and affect.  Nursing note and vitals reviewed.    UC Treatments / Results  Labs (all labs ordered are listed, but only abnormal results are displayed) Labs Reviewed  POCT URINALYSIS DIP (DEVICE)    EKG  EKG Interpretation None       Radiology No results found.  Procedures Procedures (including critical care time)  Medications Ordered in UC Medications - No data to display   Initial Impression / Assessment and Plan / UC Course  I have reviewed the triage vital signs and the nursing notes.  Pertinent labs & imaging results that were available during my care of the patient were reviewed by me and considered in my medical decision making (see chart for details).       Final Clinical Impressions(s) / UC Diagnoses   Final diagnoses:  None    New Prescriptions New Prescriptions   No medications on file     Controlled Substance Prescriptions [place] Controlled Substance Registry consulted? Not Applicable   Alene MiresOmohundro, Naylea Wigington C, NP 01/15/17 1133    Alene Miresmohundro, Dare Spillman C, NP 01/15/17 1155

## 2017-01-15 NOTE — ED Triage Notes (Signed)
Pt  Reports   Symptoms  Of  Cough   Congestion     For   sev   Days   As   Well   As   Symptoms   Of  A  uti      Pt   Ambulated  To  Room  With a  Steady  Fluid  Gait

## 2017-01-15 NOTE — Discharge Instructions (Signed)
Continue to push fluids and take over the counter medications as directed on the back of the box for symptomatic relief.  ° °

## 2017-03-02 ENCOUNTER — Emergency Department: Payer: No Typology Code available for payment source

## 2017-03-02 ENCOUNTER — Encounter: Payer: Self-pay | Admitting: Emergency Medicine

## 2017-03-02 ENCOUNTER — Emergency Department
Admission: EM | Admit: 2017-03-02 | Discharge: 2017-03-02 | Disposition: A | Discharge status: Home / Self Care | Payer: No Typology Code available for payment source | Attending: Emergency Medicine | Admitting: Emergency Medicine

## 2017-03-02 DIAGNOSIS — R079 Chest pain, unspecified: Secondary | ICD-10-CM | POA: Diagnosis not present

## 2017-03-02 DIAGNOSIS — J45909 Unspecified asthma, uncomplicated: Secondary | ICD-10-CM | POA: Diagnosis not present

## 2017-03-02 DIAGNOSIS — I10 Essential (primary) hypertension: Secondary | ICD-10-CM | POA: Diagnosis not present

## 2017-03-02 DIAGNOSIS — Z79899 Other long term (current) drug therapy: Secondary | ICD-10-CM | POA: Insufficient documentation

## 2017-03-02 DIAGNOSIS — R51 Headache: Secondary | ICD-10-CM | POA: Diagnosis not present

## 2017-03-02 DIAGNOSIS — M542 Cervicalgia: Secondary | ICD-10-CM | POA: Insufficient documentation

## 2017-03-02 MED ORDER — METOPROLOL TARTRATE 50 MG PO TABS
50.0000 mg | ORAL_TABLET | Freq: Two times a day (BID) | ORAL | 0 refills | Status: DC
Start: 2017-03-02 — End: 2017-04-02
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
## 2017-03-05 ENCOUNTER — Telehealth: Payer: Self-pay | Admitting: Internal Medicine

## 2017-03-11 ENCOUNTER — Ambulatory Visit (HOSPITAL_COMMUNITY)
Admission: EM | Admit: 2017-03-11 | Discharge: 2017-03-11 | Disposition: A | Discharge status: Home / Self Care | Payer: Self-pay | Attending: Physician Assistant | Admitting: Physician Assistant

## 2017-03-11 ENCOUNTER — Encounter (HOSPITAL_COMMUNITY): Payer: Self-pay | Admitting: Emergency Medicine

## 2017-03-11 DIAGNOSIS — S161XXA Strain of muscle, fascia and tendon at neck level, initial encounter: Secondary | ICD-10-CM

## 2017-03-11 MED ORDER — MELOXICAM 15 MG PO TABS: 7.5000 mg | ORAL_TABLET | Freq: Every day | ORAL | 0 refills | Status: DC

## 2017-03-11 MED ORDER — CYCLOBENZAPRINE HCL 10 MG PO TABS: 5.0000 mg | ORAL_TABLET | Freq: Three times a day (TID) | ORAL | 0 refills | Status: DC | PRN
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
## 2017-03-11 NOTE — Discharge Instructions (Signed)
Take only the medications that I am prescribing for pain for now.
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
## 2017-04-02 ENCOUNTER — Ambulatory Visit (INDEPENDENT_AMBULATORY_CARE_PROVIDER_SITE_OTHER): Payer: Self-pay | Admitting: Internal Medicine

## 2017-04-02 ENCOUNTER — Encounter: Payer: Self-pay | Admitting: Internal Medicine

## 2017-04-02 DIAGNOSIS — I10 Essential (primary) hypertension: Secondary | ICD-10-CM | POA: Insufficient documentation

## 2017-04-02 DIAGNOSIS — J45909 Unspecified asthma, uncomplicated: Secondary | ICD-10-CM | POA: Insufficient documentation

## 2017-04-02 DIAGNOSIS — J4520 Mild intermittent asthma, uncomplicated: Secondary | ICD-10-CM

## 2017-04-02 MED ORDER — METOPROLOL TARTRATE 50 MG PO TABS: 50.0000 mg | ORAL_TABLET | Freq: Two times a day (BID) | ORAL | 0 refills | Status: DC

## 2017-04-02 NOTE — Assessment & Plan Note (Signed)
Discussed DASH diet and exercise for weight loss Metoprolol refilled today
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
## 2017-04-02 NOTE — Patient Instructions (Signed)

## 2017-04-02 NOTE — Assessment & Plan Note (Signed)
Continue Albuterol prn 

## 2017-04-15 ENCOUNTER — Other Ambulatory Visit: Payer: Self-pay

## 2017-04-15 ENCOUNTER — Encounter (HOSPITAL_COMMUNITY): Payer: Self-pay | Admitting: *Deleted

## 2017-04-15 ENCOUNTER — Ambulatory Visit (HOSPITAL_COMMUNITY)
Admission: EM | Admit: 2017-04-15 | Discharge: 2017-04-15 | Disposition: A | Discharge status: Home / Self Care | Payer: Self-pay

## 2017-04-15 DIAGNOSIS — R300 Dysuria: Secondary | ICD-10-CM

## 2017-04-15 DIAGNOSIS — J069 Acute upper respiratory infection, unspecified: Secondary | ICD-10-CM

## 2017-04-15 DIAGNOSIS — R350 Frequency of micturition: Secondary | ICD-10-CM

## 2017-04-15 LAB — POCT URINALYSIS DIP (DEVICE)
Bilirubin Urine: NEGATIVE
Glucose, UA: NEGATIVE mg/dL
Hgb urine dipstick: NEGATIVE
Ketones, ur: NEGATIVE mg/dL
Leukocytes, UA: NEGATIVE
Nitrite: NEGATIVE
Protein, ur: NEGATIVE mg/dL
Specific Gravity, Urine: 1.025 (ref 1.005–1.030)
Urobilinogen, UA: 0.2 mg/dL (ref 0.0–1.0)
pH: 6.5 (ref 5.0–8.0)

## 2017-04-15 MED ORDER — ALBUTEROL SULFATE HFA 108 (90 BASE) MCG/ACT IN AERS: 1.0000 | INHALATION_SPRAY | Freq: Four times a day (QID) | RESPIRATORY_TRACT | 0 refills | Status: DC | PRN

## 2017-04-15 MED ORDER — BENZONATATE 100 MG PO CAPS: 100.0000 mg | ORAL_CAPSULE | Freq: Three times a day (TID) | ORAL | 0 refills | Status: DC
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
## 2017-04-15 NOTE — Discharge Instructions (Addendum)
You likely having a viral upper respiratory infection. We recommended symptom control. I expect your symptoms to start improving in the next 1-2 weeks.  -Please use your inhaler every 6 hours, 1-2 puffs consistently for 3 days, as symptoms improve may return to as needed.   1. Take a daily allergy pill/anti-histamine like Zyrtec, Claritin, or Store brand consistently for 2 weeks  2. For congestion you may try an oral decongestant like sudafed. You may also try intranasal flonase nasal spray or saline irrigations (neti pot, sinus cleanse)  3. For your sore throat you may try cepacol lozenges, salt water gargles, throat spray. Treatment of congestion may also help your sore throat.  4. For cough you may try Tessalon, Robitussen, Delsym  5. Take Tylenol or Ibuprofen to help with pain/inflammation  6. Stay hydrated, drink plenty of fluids to keep throat coated and less irritated  Honey Tea:  For sore throat try using a honey-based tea. Use 3 teaspoons of honey with juice squeezed from half lemon. Place shaved pieces of ginger into 1/2-1 cup of water and warm over stove top. Then mix the ingredients and repeat every 4 hours as needed.   Please return if you start to experience significant shortness of breath, chest pain, difficulty breathing.   I recommend follow up with PCP or a cardiologist for your heart murmur.

## 2017-04-15 NOTE — ED Triage Notes (Addendum)
Cough, congested, headaches, frequent urination, took Advil 2 hours ago, per pt she drinks a lot of tea during the day, per pt she gets burning in her vaginal area.

## 2017-05-21 ENCOUNTER — Encounter: Payer: Self-pay | Admitting: Internal Medicine

## 2017-05-21 ENCOUNTER — Ambulatory Visit: Payer: Self-pay | Admitting: Internal Medicine

## 2017-05-21 VITALS — BP 130/88 | HR 58 | Temp 97.8°F | Ht 65.0 in | Wt 244.2 lb

## 2017-05-21 DIAGNOSIS — I10 Essential (primary) hypertension: Secondary | ICD-10-CM

## 2017-05-21 DIAGNOSIS — Z0001 Encounter for general adult medical examination with abnormal findings: Secondary | ICD-10-CM

## 2017-05-21 DIAGNOSIS — Z13220 Encounter for screening for lipoid disorders: Secondary | ICD-10-CM

## 2017-05-21 DIAGNOSIS — Z1231 Encounter for screening mammogram for malignant neoplasm of breast: Secondary | ICD-10-CM

## 2017-05-21 DIAGNOSIS — Z23 Encounter for immunization: Secondary | ICD-10-CM

## 2017-05-21 DIAGNOSIS — Z1211 Encounter for screening for malignant neoplasm of colon: Secondary | ICD-10-CM

## 2017-05-21 DIAGNOSIS — Z131 Encounter for screening for diabetes mellitus: Secondary | ICD-10-CM

## 2017-05-21 DIAGNOSIS — Z1239 Encounter for other screening for malignant neoplasm of breast: Secondary | ICD-10-CM

## 2017-05-21 LAB — CBC
HCT: 41.8 % (ref 36.0–46.0)
Hemoglobin: 13.9 g/dL (ref 12.0–15.0)
MCHC: 33.1 g/dL (ref 30.0–36.0)
MCV: 87.6 fl (ref 78.0–100.0)
Platelets: 239 10*3/uL (ref 150.0–400.0)
RBC: 4.78 Mil/uL (ref 3.87–5.11)
RDW: 13.4 % (ref 11.5–15.5)
WBC: 9.1 10*3/uL (ref 4.0–10.5)

## 2017-05-21 LAB — COMPREHENSIVE METABOLIC PANEL
ALT: 11 U/L (ref 0–35)
AST: 11 U/L (ref 0–37)
Albumin: 4 g/dL (ref 3.5–5.2)
Alkaline Phosphatase: 70 U/L (ref 39–117)
BUN: 12 mg/dL (ref 6–23)
CO2: 34 mEq/L — ABNORMAL HIGH (ref 19–32)
Calcium: 9.2 mg/dL (ref 8.4–10.5)
Chloride: 100 mEq/L (ref 96–112)
Creatinine, Ser: 0.67 mg/dL (ref 0.40–1.20)
GFR: 98.42 mL/min (ref >60.00)
Glucose, Bld: 98 mg/dL (ref 70–99)
Potassium: 4 mEq/L (ref 3.5–5.1)
Sodium: 140 mEq/L (ref 135–145)
Total Bilirubin: 0.3 mg/dL (ref 0.2–1.2)
Total Protein: 6.8 g/dL (ref 6.0–8.3)

## 2017-05-21 LAB — LIPID PANEL
Cholesterol: 172 mg/dL (ref 0–200)
HDL: 41.6 mg/dL (ref >39.00)
NonHDL: 130.11
Total CHOL/HDL Ratio: 4
Triglycerides: 223 mg/dL — ABNORMAL HIGH (ref 0.0–149.0)
VLDL: 44.6 mg/dL — ABNORMAL HIGH (ref 0.0–40.0)

## 2017-05-21 LAB — LDL CHOLESTEROL, DIRECT: Direct LDL: 103 mg/dL

## 2017-05-21 LAB — HEMOGLOBIN A1C: Hgb A1c MFr Bld: 6.2 % (ref 4.6–6.5)

## 2017-05-21 NOTE — Patient Instructions (Signed)

## 2017-05-21 NOTE — Progress Notes (Signed)
Subjective:    Patient ID: Kaitlyn KempLisa P Bray, female    DOB: 09/11/1965, 52 y.o.   MRN: [id number]  HPI  Pt presents to the clinic today for her annual exam.  Flu: never Tetanus: unsure Pap Smear: 03/2008, partial hysterectomy Mammogram: 2016, [facility] Colon Screening: never Vision Screening: as needed Dentist: as needed  Diet: She does eat meat. She consumes some fruits and veggies. She does eat fried foods. She drinks mostly water and sweet tea. Exercise: None  Review of Systems  Past Medical History:  Diagnosis Date  . Asthma   . Hernia   . Hypertension     Current Outpatient Medications  Medication Sig Dispense Refill  . albuterol (PROVENTIL HFA;VENTOLIN HFA) 108 (90 Base) MCG/ACT inhaler Inhale 1-2 puffs into the lungs every 6 (six) hours as needed for wheezing or shortness of breath. 6.7 g 0  . ibuprofen (ADVIL,MOTRIN) 200 MG tablet Take 200 mg by mouth every 6 (six) hours as needed.    . metoprolol tartrate (LOPRESSOR) 50 MG tablet Take 1 tablet (50 mg total) 2 (two) times daily by mouth. 180 tablet 0   No current facility-administered medications for this visit.     Allergies  Allergen Reactions  . Codeine     Fast heart rate  . Penicillins Rash  . Sulfa Drugs Cross Reactors Rash    Family History  Problem Relation Age of Onset  . Hypertension Mother   . Stroke Mother   . Dementia Mother   . Heart disease Mother   . Hyperlipidemia Mother   . COPD Father   . Arthritis Father   . Mental illness Father   . Heart disease Maternal Grandmother   . Heart disease Maternal Grandfather   . Alzheimer's disease Maternal Grandfather   . Uterine cancer Paternal Grandmother     Social History   Socioeconomic History  . Marital status: Married    Spouse name: Not on file  . Number of children: Not on file  . Years of education: Not on file  . Highest education level: Not on file  Social Needs  . Financial resource strain: Not on file  . Food insecurity -  worry: Not on file  . Food insecurity - inability: Not on file  . Transportation needs - medical: Not on file  . Transportation needs - non-medical: Not on file  Occupational History  . Not on file  Tobacco Use  . Smoking status: Never Smoker  . Smokeless tobacco: Never Used  Substance and Sexual Activity  . Alcohol use: No  . Drug use: No  . Sexual activity: Yes    Birth control/protection: Surgical  Other Topics Concern  . Not on file  Social History Narrative  . Not on file     Constitutional: Pt reports weight gain. Denies fever, malaise, fatigue, headache.  HEENT: Denies eye pain, eye redness, ear pain, ringing in the ears, wax buildup, runny nose, nasal congestion, bloody nose, or sore throat. Respiratory: Denies difficulty breathing, shortness of breath, cough or sputum production.   Cardiovascular: Denies chest pain, chest tightness, palpitations or swelling in the hands or feet.  Gastrointestinal: Denies abdominal pain, bloating, constipation, diarrhea or blood in the stool.  GU: Pt reports urinary frequency and nocturia. Denies urgency, pain with urination, burning sensation, blood in urine, odor or discharge. Musculoskeletal: Pt reports intermittent back pain (currently working with a chiropractor). Denies decrease in range of motion, difficulty with gait, muscle pain or joint pain and swelling.  Skin: Denies redness, rashes, lesions or ulcercations.  Neurological: Denies dizziness, difficulty with memory, difficulty with speech or problems with balance and coordination.  Psych: Denies anxiety, depression, SI/HI.  No other specific complaints in a complete review of systems (except as listed in HPI above).     Objective:   Physical Exam   BP 130/88   Pulse (!) 58   Temp 97.8 F (36.6 C) (Oral)   Ht 5\' 5"  (1.651 m)   Wt 244 lb 4 oz (110.8 kg)   SpO2 97%   BMI 40.65 kg/m  Wt Readings from Last 3 Encounters:  05/21/17 244 lb 4 oz (110.8 kg)  04/02/17 239 lb  (108.4 kg)  11/21/15 230 lb (104.3 kg)    General: Appears her stated age, obese in NAD. Skin: Warm, dry and intact. Skin tags noted on neck. HEENT: Head: normal shape and size; Eyes: sclera white, no icterus, conjunctiva pink, PERRLA and EOMs intact, strabismus noted of left eye;Throat/Mouth: Teeth present, mucosa pink and moist, no exudate, lesions or ulcerations noted.  Neck:  Neck supple, trachea midline. No masses, lumps or thyromegaly present.  Cardiovascular: Normal rate and rhythm. S1,S2 noted.  Murmur noted. No JVD or BLE edema.  Pulmonary/Chest: Normal effort and positive vesicular breath sounds. No respiratory distress. No wheezes, rales or ronchi noted.  Abdomen: Soft and nontender. Normal bowel sounds. Ventral hernia noted. Liver, spleen and kidneys non palpable. Musculoskeletal: Normal flexion, extension and rotation of the spine. Bony tenderness noted over the thoracic and lumbar spine. Strength 5/5 BUE/BLE. No difficulty with gait.  Neurological: Alert and oriented. Cranial nerves II-XII grossly intact. Coordination normal.  Psychiatric: Mood and affect normal. Behavior is normal. Judgment and thought content normal.    BMET    Component Value Date/Time   NA 138 11/23/2015 1255   K 3.6 11/23/2015 1255   CL 104 11/23/2015 1255   CO2 27 11/21/2015 1530   GLUCOSE 87 11/23/2015 1255   BUN 10 11/23/2015 1255   CREATININE 0.60 11/23/2015 1255   CALCIUM 9.0 11/21/2015 1530   GFRNONAA >60 11/21/2015 1530   GFRAA >60 11/21/2015 1530    Lipid Panel  No results found for: CHOL, TRIG, HDL, CHOLHDL, VLDL, LDLCALC  CBC    Component Value Date/Time   WBC 10.8 11/21/2015 1530   RBC 4.92 11/21/2015 1530   HGB 12.9 11/23/2015 1255   HCT 38.0 11/23/2015 1255   PLT 192 11/21/2015 1530   MCV 85.3 11/21/2015 1530   MCH 29.9 11/21/2015 1530   MCHC 35.1 11/21/2015 1530   RDW 13.4 11/21/2015 1530   LYMPHSABS 2.1 03/03/2013 1216   MONOABS 0.4 03/03/2013 1216   EOSABS 0.2  03/03/2013 1216   BASOSABS 0.1 03/03/2013 1216    Hgb A1C No results found for: HGBA1C         Assessment & Plan:   Preventative Health Maintenance:  She declines flu shot Tdap today Mammogram ordered, she will call [facility] to schedule, number provided She does not need pap smears and she declines pelvic exam today Referral to GI placed for screening colonoscopy, they will call you to set this up Encouraged her to consume a balanced diet and exercise regimen Advised her to see an eye doctor and dentist annually  Will check CBC, CMET, Lipid and A1C today  RTC in 1 year, sooner if needed Nicki Reaper, NP

## 2017-05-30 ENCOUNTER — Telehealth: Payer: Self-pay | Admitting: Internal Medicine

## 2017-05-30 NOTE — Telephone Encounter (Signed)
Pt called to report she had not received lab results she asked to be mailed to her. Noted mailed out 05/22/17. Also noted by R. Baity to call pt results. Result note read to patient.

## 2017-06-22 ENCOUNTER — Encounter: Payer: Self-pay | Admitting: Internal Medicine

## 2017-06-29 ENCOUNTER — Other Ambulatory Visit: Payer: Self-pay

## 2017-06-29 MED ORDER — METOPROLOL TARTRATE 50 MG PO TABS: 50.0000 mg | ORAL_TABLET | Freq: Two times a day (BID) | ORAL | 2 refills | Status: DC

## 2017-10-21 ENCOUNTER — Ambulatory Visit (INDEPENDENT_AMBULATORY_CARE_PROVIDER_SITE_OTHER): Payer: Self-pay

## 2017-10-21 ENCOUNTER — Ambulatory Visit (HOSPITAL_COMMUNITY)
Admission: EM | Admit: 2017-10-21 | Discharge: 2017-10-21 | Disposition: A | Discharge status: Home / Self Care | Payer: Self-pay | Attending: Family Medicine | Admitting: Family Medicine

## 2017-10-21 ENCOUNTER — Other Ambulatory Visit: Payer: Self-pay

## 2017-10-21 ENCOUNTER — Encounter (HOSPITAL_COMMUNITY): Payer: Self-pay | Admitting: *Deleted

## 2017-10-21 DIAGNOSIS — R05 Cough: Secondary | ICD-10-CM

## 2017-10-21 DIAGNOSIS — R059 Cough, unspecified: Secondary | ICD-10-CM

## 2017-10-21 DIAGNOSIS — R358 Other polyuria: Secondary | ICD-10-CM

## 2017-10-21 DIAGNOSIS — J208 Acute bronchitis due to other specified organisms: Secondary | ICD-10-CM

## 2017-10-21 DIAGNOSIS — J209 Acute bronchitis, unspecified: Secondary | ICD-10-CM

## 2017-10-21 DIAGNOSIS — H9201 Otalgia, right ear: Secondary | ICD-10-CM

## 2017-10-21 LAB — POCT URINALYSIS DIP (DEVICE)
Bilirubin Urine: NEGATIVE
Glucose, UA: NEGATIVE mg/dL
Hgb urine dipstick: NEGATIVE
Ketones, ur: NEGATIVE mg/dL
Leukocytes, UA: NEGATIVE
Nitrite: NEGATIVE
Protein, ur: NEGATIVE mg/dL
Specific Gravity, Urine: 1.025 (ref 1.005–1.030)
Urobilinogen, UA: 0.2 mg/dL (ref 0.0–1.0)
pH: 6 (ref 5.0–8.0)

## 2017-10-21 MED ORDER — CETIRIZINE-PSEUDOEPHEDRINE ER 5-120 MG PO TB12: 1.0000 | ORAL_TABLET | Freq: Every day | ORAL | 0 refills | Status: DC

## 2017-10-21 MED ORDER — IPRATROPIUM-ALBUTEROL 0.5-2.5 (3) MG/3ML IN SOLN
RESPIRATORY_TRACT | Status: AC
  Filled 2017-10-21: qty 3

## 2017-10-21 MED ORDER — BENZONATATE 100 MG PO CAPS: 100.0000 mg | ORAL_CAPSULE | Freq: Three times a day (TID) | ORAL | 0 refills | Status: DC | PRN

## 2017-10-21 MED ORDER — IPRATROPIUM-ALBUTEROL 0.5-2.5 (3) MG/3ML IN SOLN
3.0000 mL | Freq: Once | RESPIRATORY_TRACT | Status: AC
  Administered 2017-10-21: 3 mL via RESPIRATORY_TRACT

## 2017-10-21 NOTE — Discharge Instructions (Addendum)
Breathing treatment given in office Chest x-ray did not show pneumonia Get plenty of rest and push fluids Zyrtec D prescribed.  Take as directed and to completion Tessalon perles prescribed.  Take as needed for symptomatic relief of cough Continue to use inhaler as needed for symptomatic relief Follow up with PCP if symptoms persists Return or go to the ER if you have any new or worsening symptoms

## 2017-10-21 NOTE — ED Triage Notes (Addendum)
C/O having a cough "for quite a time", which seemed to improve, but over past week cough has been progressively worsening and more productive.  Now c/o right ear pain.    Also c/o polyuria.

## 2017-10-21 NOTE — ED Provider Notes (Signed)
[facility]   [id number] 10/21/17 Arrival Time: 1338  SUBJECTIVE:  Kaitlyn Bray is a 52 y.o. female who presents with worsening cough for several weeks.  Denies positive sick exposure or precipitating event, but states she works at a daycare.  Describes cough as constant and productive with clear and white sputum.  Has tried delsum with relief.  Denies aggravating symptoms.  Reports previous symptoms in the past and diagnosed with bronchitis.   Complains of fatigue, subjective fever, sinus pain, sore throat, ear pain, and rhinorrhea.  Denies SOB, wheezing, chest pain, nausea, changes in bowel or bladder habits.    ROS: As per HPI.  Past Medical History:  Diagnosis Date  . Asthma   . Hernia   . Hypertension    Past Surgical History:  Procedure Laterality Date  . ABDOMINAL HYSTERECTOMY    . CESAREAN SECTION    . MYOMECTOMY     Allergies  Allergen Reactions  . Codeine     Fast heart rate  . Penicillins Rash  . Sulfa Drugs Cross Reactors Rash   No current facility-administered medications on file prior to encounter.    Current Outpatient Medications on File Prior to Encounter  Medication Sig Dispense Refill  . albuterol (PROVENTIL HFA;VENTOLIN HFA) 108 (90 Base) MCG/ACT inhaler Inhale 1-2 puffs into the lungs every 6 (six) hours as needed for wheezing or shortness of breath. 6.7 g 0  . ibuprofen (ADVIL,MOTRIN) 200 MG tablet Take 200 mg by mouth every 6 (six) hours as needed.    . metoprolol tartrate (LOPRESSOR) 50 MG tablet Take 1 tablet (50 mg total) by mouth 2 (two) times daily. 180 tablet 2    Social History   Socioeconomic History  . Marital status: Married    Spouse name: Not on file  . Number of children: Not on file  . Years of education: Not on file  . Highest education level: Not on file  Occupational History  . Not on file  Social Needs  . Financial resource strain: Not on file  . Food insecurity:    Worry: Not on file    Inability: Not on file    . Transportation needs:    Medical: Not on file    Non-medical: Not on file  Tobacco Use  . Smoking status: Never Smoker  . Smokeless tobacco: Never Used  Substance and Sexual Activity  . Alcohol use: No  . Drug use: No  . Sexual activity: Not on file  Lifestyle  . Physical activity:    Days per week: Not on file    Minutes per session: Not on file  . Stress: Not on file  Relationships  . Social connections:    Talks on phone: Not on file    Gets together: Not on file    Attends religious service: Not on file    Active member of club or organization: Not on file    Attends meetings of clubs or organizations: Not on file    Relationship status: Not on file  . Intimate partner violence:    Fear of current or ex partner: Not on file    Emotionally abused: Not on file    Physically abused: Not on file    Forced sexual activity: Not on file  Other Topics Concern  . Not on file  Social History Narrative  . Not on file   Family History  Problem Relation Age of Onset  . Hypertension Mother   . Stroke Mother   .  Dementia Mother   . Heart disease Mother   . Hyperlipidemia Mother   . COPD Father   . Arthritis Father   . Mental illness Father   . Heart disease Maternal Grandmother   . Heart disease Maternal Grandfather   . Alzheimer's disease Maternal Grandfather   . Uterine cancer Paternal Grandmother      OBJECTIVE:  Vitals:   10/21/17 1416  BP: (!) 157/81  Pulse: 75  Resp: 18  Temp: 98.5 F (36.9 C)  TempSrc: Oral  SpO2: 96%     General appearance: AOx3 in no acute distress HEENT: Ears: EACs clear bilaterally, TMs pearly gray with visible cone of light; Eyes: PERRL.  EOM grossly intact.  Sinuses nontender; no clear rhinorrhea; tonsils nonerythematous, uvula midline Neck: supple without LAD Lungs: wheezes heard anterior chest right side; persistent harsh cough during examination; improvement following breathing treatment Heart: systolic murmur aortic region.   Radial pulses 2+ symmetrical bilaterally Skin: warm and moist Psychological: alert and cooperative; normal mood and affect  Results for orders placed or performed during the hospital encounter of 10/21/17  POCT urinalysis dip (device)  Result Value Ref Range   Glucose, UA NEGATIVE NEGATIVE mg/dL   Bilirubin Urine NEGATIVE NEGATIVE   Ketones, ur NEGATIVE NEGATIVE mg/dL   Specific Gravity, Urine 1.025 1.005 - 1.030   Hgb urine dipstick NEGATIVE NEGATIVE   pH 6.0 5.0 - 8.0   Protein, ur NEGATIVE NEGATIVE mg/dL   Urobilinogen, UA 0.2 0.0 - 1.0 mg/dL   Nitrite NEGATIVE NEGATIVE   Leukocytes, UA NEGATIVE NEGATIVE    Labs Reviewed  POCT URINALYSIS DIP (DEVICE)    Results for orders placed or performed during the hospital encounter of 10/21/17 (from the past 24 hour(s))  POCT urinalysis dip (device)     Status: None   Collection Time: 10/21/17  2:34 PM  Result Value Ref Range   Glucose, UA NEGATIVE NEGATIVE mg/dL   Bilirubin Urine NEGATIVE NEGATIVE   Ketones, ur NEGATIVE NEGATIVE mg/dL   Specific Gravity, Urine 1.025 1.005 - 1.030   Hgb urine dipstick NEGATIVE NEGATIVE   pH 6.0 5.0 - 8.0   Protein, ur NEGATIVE NEGATIVE mg/dL   Urobilinogen, UA 0.2 0.0 - 1.0 mg/dL   Nitrite NEGATIVE NEGATIVE   Leukocytes, UA NEGATIVE NEGATIVE     DIAGNOSTIC STUDIES:   CLINICAL DATA: Cough and sore throat  EXAM: CHEST - 2 VIEW  COMPARISON: March 02, 2017  FINDINGS: Degree of inspiration is shallow. Lungs are clear. The heart is upper normal in size with pulmonary vascularity within normal limits. No adenopathy. No evident bone lesions.  IMPRESSION: No edema or consolidation.   Electronically Signed By: Bretta Bang III M.D. On: 10/21/2017 15:28  I have reviewed the x-rays myself and the radiologist interpretation. I am in agreement with the radiologist interpretation.    ASSESSMENT & PLAN:  1. Cough   2. Acute bronchitis due to other specified organisms      Meds ordered this encounter  Medications  . ipratropium-albuterol (DUONEB) 0.5-2.5 (3) MG/3ML nebulizer solution 3 mL  . cetirizine-pseudoephedrine (ZYRTEC-D) 5-120 MG tablet    Sig: Take 1 tablet by mouth daily.    Dispense:  30 tablet    Refill:  0    Order Specific Question:   Supervising Provider    Answer:   Isa Rankin [phone]  . benzonatate (TESSALON) 100 MG capsule    Sig: Take 1 capsule (100 mg total) by mouth 3 (three) times daily as needed for  cough.    Dispense:  21 capsule    Refill:  0    Order Specific Question:   Supervising Provider    Answer:   Isa RankinMURRAY, LAURA WILSON [[id number]]   Breathing treatment given in office Chest x-ray did not show pneumonia Get plenty of rest and push fluids Zyrtec D prescribed.  Take as directed and to completion Tessalon perles prescribed.  Take as needed for symptomatic relief of cough Continue to use inhaler as needed for symptomatic relief Follow up with PCP if symptoms persists Return or go to the ER if you have any new or worsening symptoms  Reviewed expectations re: course of current medical issues. Questions answered. Outlined signs and symptoms indicating need for more acute intervention. Patient verbalized understanding. After Visit Summary given.          Rennis HardingWurst, Abdirizak Richison, PA-C 10/21/17 1548
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
## 2018-01-15 ENCOUNTER — Encounter: Payer: Self-pay | Admitting: Emergency Medicine

## 2018-01-15 ENCOUNTER — Emergency Department
Admission: EM | Admit: 2018-01-15 | Discharge: 2018-01-15 | Disposition: A | Discharge status: Home / Self Care | Payer: Self-pay | Attending: Emergency Medicine | Admitting: Emergency Medicine

## 2018-01-15 ENCOUNTER — Emergency Department: Payer: Self-pay

## 2018-01-15 DIAGNOSIS — J40 Bronchitis, not specified as acute or chronic: Secondary | ICD-10-CM | POA: Insufficient documentation

## 2018-01-15 DIAGNOSIS — R350 Frequency of micturition: Secondary | ICD-10-CM | POA: Insufficient documentation

## 2018-01-15 DIAGNOSIS — I10 Essential (primary) hypertension: Secondary | ICD-10-CM | POA: Insufficient documentation

## 2018-01-15 DIAGNOSIS — Z79899 Other long term (current) drug therapy: Secondary | ICD-10-CM | POA: Insufficient documentation

## 2018-01-15 LAB — URINALYSIS, COMPLETE (UACMP) WITH MICROSCOPIC
Bacteria, UA: NONE SEEN
Bilirubin Urine: NEGATIVE
Glucose, UA: NEGATIVE mg/dL
Hgb urine dipstick: NEGATIVE
Ketones, ur: NEGATIVE mg/dL
Leukocytes, UA: NEGATIVE
Nitrite: NEGATIVE
Protein, ur: NEGATIVE mg/dL
Specific Gravity, Urine: 1.02 (ref 1.005–1.030)
pH: 6 (ref 5.0–8.0)

## 2018-01-15 MED ORDER — PSEUDOEPH-BROMPHEN-DM 30-2-10 MG/5ML PO SYRP: 5.0000 mL | ORAL_SOLUTION | Freq: Four times a day (QID) | ORAL | 0 refills | Status: DC | PRN

## 2018-01-15 MED ORDER — PHENAZOPYRIDINE HCL 200 MG PO TABS: 200.0000 mg | ORAL_TABLET | Freq: Three times a day (TID) | ORAL | 0 refills | Status: DC | PRN

## 2018-01-15 MED ORDER — AZITHROMYCIN 250 MG PO TABS: ORAL_TABLET | ORAL | 0 refills | Status: AC

## 2018-01-15 NOTE — ED Provider Notes (Signed)
[facility] Emergency Department Provider Note   ____________________________________________   First MD Initiated Contact with Patient 01/15/18 [phone]     (approximate)  I have reviewed the triage vital signs and the nursing notes.   HISTORY  Chief Complaint Otalgia; Cough; and Dysuria    HPI Kaitlyn Bray is a 52 y.o. female patient presents for few days with ducted cough.  Patient also complained of nasal congestion and right ear pressure/pain.  Patient states she is noticed dysuria yesterday.  Denies vaginal discharge, flank pain, fever.  Patient rates her pain discomfort as a 5/10.  Patient described the pain is "aching".  No palliative measures for complaint.  Past Medical History:  Diagnosis Date  . Asthma   . Hernia   . Hypertension     Patient Active Problem List   Diagnosis Date Noted  . HTN (hypertension) 04/02/2017  . Asthma 04/02/2017    Past Surgical History:  Procedure Laterality Date  . ABDOMINAL HYSTERECTOMY    . CESAREAN SECTION    . MYOMECTOMY      Prior to Admission medications   Medication Sig Start Date End Date Taking? Authorizing Provider  albuterol (PROVENTIL HFA;VENTOLIN HFA) 108 (90 Base) MCG/ACT inhaler Inhale 1-2 puffs into the lungs every 6 (six) hours as needed for wheezing or shortness of breath. 04/15/17   Wieters, Hallie C, PA-C  azithromycin (ZITHROMAX Z-PAK) 250 MG tablet Take 2 tablets (500 mg) on  Day 1,  followed by 1 tablet (250 mg) once daily on Days 2 through 5. 01/15/18 01/20/18  Joni Reining, PA-C  benzonatate (TESSALON) 100 MG capsule Take 1 capsule (100 mg total) by mouth 3 (three) times daily as needed for cough. 10/21/17   Wurst, [place], PA-C  brompheniramine-pseudoephedrine-DM 30-2-10 MG/5ML syrup Take 5 mLs by mouth 4 (four) times daily as needed. 01/15/18   Joni Reining, PA-C  cetirizine-pseudoephedrine (ZYRTEC-D) 5-120 MG tablet Take 1 tablet by mouth daily. 10/21/17   Wurst, [place], PA-C    ibuprofen (ADVIL,MOTRIN) 200 MG tablet Take 200 mg by mouth every 6 (six) hours as needed.    [provider]  metoprolol tartrate (LOPRESSOR) 50 MG tablet Take 1 tablet (50 mg total) by mouth 2 (two) times daily. 06/29/17   Lorre Munroe, NP  phenazopyridine (PYRIDIUM) 200 MG tablet Take 1 tablet (200 mg total) by mouth 3 (three) times daily as needed for pain. 01/15/18   Joni Reining, PA-C    Allergies Codeine; Penicillins; and Sulfa drugs cross reactors  Family History  Problem Relation Age of Onset  . Hypertension Mother   . Stroke Mother   . Dementia Mother   . Heart disease Mother   . Hyperlipidemia Mother   . COPD Father   . Arthritis Father   . Mental illness Father   . Heart disease Maternal Grandmother   . Heart disease Maternal Grandfather   . Alzheimer's disease Maternal Grandfather   . Uterine cancer Paternal Grandmother     Social History Social History   Tobacco Use  . Smoking status: Never Smoker  . Smokeless tobacco: Never Used  Substance Use Topics  . Alcohol use: No  . Drug use: No    Review of Systems Constitutional: No fever/chills Eyes: No visual changes. ENT: No sore throat. Cardiovascular: Denies chest pain. Respiratory: Denies shortness of breath. Gastrointestinal: No abdominal pain.  No nausea, no vomiting.  No diarrhea.  No constipation. Genitourinary: Negative for dysuria. Musculoskeletal: Negative for back pain.  Skin: Negative for rash. Neurological: Negative for headaches, focal weakness or numbness. Endocrine:Hypertension Allergic/Immunilogical: Codeine, penicillin, and sulfur. ____________________________________________   PHYSICAL EXAM:  VITAL SIGNS: ED Triage Vitals  Enc Vitals Group     BP 01/15/18 0949 (!) 157/98     Pulse Rate 01/15/18 0949 63     Resp 01/15/18 0949 20     Temp 01/15/18 0949 98.4 F (36.9 C)     Temp Source 01/15/18 0949 Oral     SpO2 01/15/18 0949 97 %     Weight 01/15/18 0950 230 lb  (104.3 kg)     Height 01/15/18 0950 5\' 5"  (1.651 m)     Head Circumference --      Peak Flow --      Pain Score 01/15/18 0950 5     Pain Loc --      Pain Edu? --      Excl. in GC? --    Constitutional: Alert and oriented. Well appearing and in no acute distress. Neck: No stridor. Hematological/Lymphatic/Immunilogical: No cervical lymphadenopathy. Cardiovascular: Normal rate, regular rhythm. Grossly normal heart sounds.  Good peripheral circulation.  Elevated blood pressure Respiratory: Normal respiratory effort.  No retractions. Lungs CTAB. Gastrointestinal: Normoactive bowel sounds.  Soft and nontender.  Ventral umbilicus hernia.  No abdominal bruits. No CVA tenderness. Musculoskeletal: No lower extremity tenderness nor edema.  No joint effusions. Neurologic:  Normal speech and language. No gross focal neurologic deficits are appreciated. No gait instability. Skin:  Skin is warm, dry and intact. No rash noted. Psychiatric: Mood and affect are normal. Speech and behavior are normal.  ____________________________________________   LABS (all labs ordered are listed, but only abnormal results are displayed)  Labs Reviewed  URINALYSIS, COMPLETE (UACMP) WITH MICROSCOPIC - Abnormal; Notable for the following components:      Result Value   Color, Urine YELLOW (*)    APPearance CLEAR (*)    All other components within normal limits   ____________________________________________  EKG   ____________________________________________  RADIOLOGY  ED MD interpretation:    Official radiology report(s): Dg Chest 2 View  Result Date: 01/15/2018 CLINICAL DATA:  Cough, congestion, shortness of breath and RIGHT earache for the past several days, fever, cough has been productive, history of asthma and bronchitis EXAM: CHEST - 2 VIEW COMPARISON:  10/21/2017 FINDINGS: Normal heart size, mediastinal contours, and pulmonary vascularity. Lungs clear. No acute infiltrate, pleural effusion or  pneumothorax. Bones demineralized. IMPRESSION: No acute abnormalities. Electronically Signed   By: Ulyses Southward M.D.   On: 01/15/2018 10:25    ____________________________________________   PROCEDURES  Procedure(s) performed: None  Procedures  Critical Care performed: No  ____________________________________________   INITIAL IMPRESSION / ASSESSMENT AND PLAN / ED COURSE  As part of my medical decision making, I reviewed the following data within the electronic MEDICAL RECORD NUMBER    Upper respiratory infection and urinary frequency.  Discussed negative x-ray and laboratory findings with patient.  Patient given discharge care instructions.  Patient advised to follow-up with urologist for increasing urinary frequency.  Patient given a work excuse advised take medication as directed.   ____________________________________________   FINAL CLINICAL IMPRESSION(S) / ED DIAGNOSES  Final diagnoses:  Bronchitis  Increased urinary frequency     ED Discharge Orders         Ordered    azithromycin (ZITHROMAX Z-PAK) 250 MG tablet     01/15/18 1039    brompheniramine-pseudoephedrine-DM 30-2-10 MG/5ML syrup  4 times daily PRN     01/15/18  1039    phenazopyridine (PYRIDIUM) 200 MG tablet  3 times daily PRN     01/15/18 1042           Note:  This document was prepared using Dragon voice recognition software and may include unintentional dictation errors.    Joni Reining, PA-C 01/15/18 1047    Emily Filbert, MD 01/15/18 1310

## 2018-01-15 NOTE — ED Notes (Signed)
See triage note    Presents with cough for a few days  Subjective fever   Afebrile on arrival  Also has had urinary freq for couple of days

## 2018-01-15 NOTE — ED Triage Notes (Signed)
Pt reports for the past few days she has had a cough, congestion and a right earache. Pt also reports some dysuria and thinks she may have a UTI.

## 2018-02-22 ENCOUNTER — Other Ambulatory Visit: Payer: Self-pay | Admitting: Internal Medicine

## 2018-03-28 ENCOUNTER — Other Ambulatory Visit: Payer: Self-pay | Admitting: Internal Medicine

## 2018-03-29 ENCOUNTER — Telehealth: Payer: Self-pay

## 2018-03-29 NOTE — Telephone Encounter (Signed)
P called saying she is having a bad cough for the last few days. She works in a daycare. I advised her she would need to be seen before anything could be prescribed. I did fill her albuterol as requested from the pharmacy.

## 2018-04-05 ENCOUNTER — Encounter: Payer: Self-pay | Admitting: Emergency Medicine

## 2018-04-05 ENCOUNTER — Emergency Department
Admission: EM | Admit: 2018-04-05 | Discharge: 2018-04-05 | Disposition: A | Discharge status: Home / Self Care | Payer: Self-pay | Attending: Emergency Medicine | Admitting: Emergency Medicine

## 2018-04-05 ENCOUNTER — Other Ambulatory Visit: Payer: Self-pay

## 2018-04-05 DIAGNOSIS — I10 Essential (primary) hypertension: Secondary | ICD-10-CM | POA: Insufficient documentation

## 2018-04-05 DIAGNOSIS — R42 Dizziness and giddiness: Secondary | ICD-10-CM | POA: Insufficient documentation

## 2018-04-05 DIAGNOSIS — J45909 Unspecified asthma, uncomplicated: Secondary | ICD-10-CM | POA: Insufficient documentation

## 2018-04-05 LAB — URINALYSIS, COMPLETE (UACMP) WITH MICROSCOPIC
Bacteria, UA: NONE SEEN
Bilirubin Urine: NEGATIVE
Glucose, UA: NEGATIVE mg/dL
Hgb urine dipstick: NEGATIVE
Ketones, ur: NEGATIVE mg/dL
Leukocytes, UA: NEGATIVE
Nitrite: NEGATIVE
Protein, ur: NEGATIVE mg/dL
Specific Gravity, Urine: 1.017 (ref 1.005–1.030)
pH: 8 (ref 5.0–8.0)

## 2018-04-05 LAB — CBC
HCT: 41.8 % (ref 36.0–46.0)
Hemoglobin: 13.7 g/dL (ref 12.0–15.0)
MCH: 28.6 pg (ref 26.0–34.0)
MCHC: 32.8 g/dL (ref 30.0–36.0)
MCV: 87.3 fL (ref 80.0–100.0)
Platelets: 257 10*3/uL (ref 150–400)
RBC: 4.79 MIL/uL (ref 3.87–5.11)
RDW: 13.2 % (ref 11.5–15.5)
WBC: 9.6 10*3/uL (ref 4.0–10.5)
nRBC: 0 % (ref 0.0–0.2)

## 2018-04-05 LAB — BASIC METABOLIC PANEL
Anion gap: 9 (ref 5–15)
BUN: 14 mg/dL (ref 6–20)
CO2: 30 mmol/L (ref 22–32)
Calcium: 9.2 mg/dL (ref 8.9–10.3)
Chloride: 102 mmol/L (ref 98–111)
Creatinine, Ser: 0.59 mg/dL (ref 0.44–1.00)
GFR calc Af Amer: >60 mL/min (ref >60)
GFR calc non Af Amer: >60 mL/min (ref >60)
Glucose, Bld: 108 mg/dL — ABNORMAL HIGH (ref 70–99)
Potassium: 3.9 mmol/L (ref 3.5–5.1)
Sodium: 141 mmol/L (ref 135–145)

## 2018-04-05 LAB — HEPATIC FUNCTION PANEL
ALT: 18 U/L (ref 0–44)
AST: 19 U/L (ref 15–41)
Albumin: 3.9 g/dL (ref 3.5–5.0)
Alkaline Phosphatase: 68 U/L (ref 38–126)
Bilirubin, Direct: <0.1 mg/dL (ref 0.0–0.2)
Total Bilirubin: 0.8 mg/dL (ref 0.3–1.2)
Total Protein: 7.2 g/dL (ref 6.5–8.1)

## 2018-04-05 MED ORDER — MECLIZINE HCL 25 MG PO TABS
50.0000 mg | ORAL_TABLET | Freq: Once | ORAL | Status: AC
  Administered 2018-04-05: 50 mg via ORAL
  Filled 2018-04-05: qty 2

## 2018-04-05 MED ORDER — MECLIZINE HCL 25 MG PO TABS: 25.0000 mg | ORAL_TABLET | Freq: Three times a day (TID) | ORAL | 1 refills | Status: DC | PRN

## 2018-04-05 MED ORDER — SODIUM CHLORIDE 0.9 % IV SOLN
Freq: Once | INTRAVENOUS | Status: AC
  Administered 2018-04-05: 12:00:00 via INTRAVENOUS

## 2018-04-05 NOTE — ED Notes (Signed)
No distress at this time.  Pt has gotten up to use that bathroom without any difficulty.  Gave pt additional ice water and sprite to drink

## 2018-04-05 NOTE — ED Triage Notes (Signed)
Patient reports several episodes of near syncope for the last 2 days. Reports when she changes position, she becomes dizzy and "seeing black". Patient reports she has also had nausea and 3 episodes of vomiting. Patient ambulatory to triage with steady gait.

## 2018-04-05 NOTE — ED Notes (Signed)
Pt IV infiltrated.  Stopped infusion and removed IV cath.  Pt had half of the ordered liter bolus before IV infiltrated.  Will notify EDP

## 2018-04-05 NOTE — ED Provider Notes (Signed)
[facility] Emergency Department Provider Note       Time seen: ----------------------------------------- 10:56 AM on 04/05/2018 -----------------------------------------   I have reviewed the triage vital signs and the nursing notes.  HISTORY   Chief Complaint Near Syncope    HPI YZABELLE CALLES is a 52 y.o. female with a history of asthma, hernia and hypertension who presents to the ED for several episodes of near syncope for the past 48 hours.  Patient reports when she changes position she becomes dizzy and sees black.  She also reports has had nausea and 3 episodes of vomiting.  She is also describes some room spinning sensation.  She had vertigo once many years ago.  She denies any recent illness.  She has had some urinary incontinence at night requiring frequent trips to the bathroom.  Past Medical History:  Diagnosis Date  . Asthma   . Hernia   . Hypertension     Patient Active Problem List   Diagnosis Date Noted  . HTN (hypertension) 04/02/2017  . Asthma 04/02/2017    Past Surgical History:  Procedure Laterality Date  . ABDOMINAL HYSTERECTOMY    . CESAREAN SECTION    . MYOMECTOMY      Allergies Codeine; Penicillins; and Sulfa drugs cross reactors  Social History Social History   Tobacco Use  . Smoking status: Never Smoker  . Smokeless tobacco: Never Used  Substance Use Topics  . Alcohol use: No  . Drug use: No   Review of Systems Constitutional: Negative for fever. Cardiovascular: Negative for chest pain. Respiratory: Negative for shortness of breath. Gastrointestinal: Negative for abdominal pain, vomiting and diarrhea. Musculoskeletal: Negative for back pain. Skin: Negative for rash. Neurological: Positive for dizziness  All systems negative/normal/unremarkable except as stated in the HPI  ____________________________________________   PHYSICAL EXAM:  VITAL SIGNS: ED Triage Vitals  Enc Vitals Group     BP  04/05/18 1018 (!) 173/114     Pulse Rate 04/05/18 1018 72     Resp 04/05/18 1018 18     Temp 04/05/18 1018 98.2 F (36.8 C)     Temp Source 04/05/18 1018 Oral     SpO2 04/05/18 1018 97 %     Weight 04/05/18 1019 245 lb (111.1 kg)     Height 04/05/18 1019 5\' 5"  (1.651 m)     Head Circumference --      Peak Flow --      Pain Score 04/05/18 1018 4     Pain Loc --      Pain Edu? --      Excl. in GC? --    Constitutional: Alert and oriented. Well appearing and in no distress. Eyes: Conjunctivae are normal. Normal extraocular movements. ENT   Head: Normocephalic and atraumatic.   Nose: No congestion/rhinnorhea.   Mouth/Throat: Mucous membranes are moist.   Neck: No stridor. Cardiovascular: Normal rate, regular rhythm. No murmurs, rubs, or gallops. Respiratory: Normal respiratory effort without tachypnea nor retractions. Breath sounds are clear and equal bilaterally. No wheezes/rales/rhonchi. Gastrointestinal: Soft and nontender. Normal bowel sounds Musculoskeletal: Nontender with normal range of motion in extremities. No lower extremity tenderness nor edema. Neurologic:  Normal speech and language. No gross focal neurologic deficits are appreciated.  Strength, sensation, cranial nerves appear to be normal Skin:  Skin is warm, dry and intact. No rash noted. Psychiatric: Mood and affect are normal. Speech and behavior are normal.  ____________________________________________  EKG: Interpreted by me.  Sinus rhythm the rate of 61  bpm, normal PR interval, normal QRS, normal QT  ____________________________________________  ED COURSE:  As part of my medical decision making, I reviewed the following data within the electronic MEDICAL RECORD NUMBER History obtained from family if available, nursing notes, old chart and ekg, as well as notes from prior ED visits. Patient presented for near syncope, we will assess with labs and imaging as indicated at this time.    Procedures ____________________________________________   LABS (pertinent positives/negatives)  Labs Reviewed  BASIC METABOLIC PANEL - Abnormal; Notable for the following components:      Result Value   Glucose, Bld 108 (*)    All other components within normal limits  URINALYSIS, COMPLETE (UACMP) WITH MICROSCOPIC - Abnormal; Notable for the following components:   Color, Urine YELLOW (*)    APPearance CLEAR (*)    All other components within normal limits  CBC  HEPATIC FUNCTION PANEL  CBG MONITORING, ED  ___________________________________________  DIFFERENTIAL DIAGNOSIS   Dehydration, electrolyte normality, peripheral vertigo, arrhythmia, medication side effect  FINAL ASSESSMENT AND PLAN  Dizziness   Plan: The patient had presented for near syncope. Patient's labs are reassuring.  No clear etiology of her symptoms was discovered.  Some of her symptoms sound like near syncope and other sound like peripheral vertigo.  We will try meclizine for her to take at home.  She is cleared for outpatient follow-up.   Ulice DashJohnathan E Williams, MD   Note: This note was generated in part or whole with voice recognition software. Voice recognition is usually quite accurate but there are transcription errors that can and very often do occur. I apologize for any typographical errors that were not detected and corrected.     Emily FilbertWilliams, Jonathan E, MD 04/05/18 1329

## 2018-06-03 ENCOUNTER — Ambulatory Visit (HOSPITAL_COMMUNITY)
Admission: EM | Admit: 2018-06-03 | Discharge: 2018-06-03 | Disposition: A | Discharge status: Home / Self Care | Payer: Self-pay | Attending: Family Medicine | Admitting: Family Medicine

## 2018-06-03 ENCOUNTER — Other Ambulatory Visit: Payer: Self-pay

## 2018-06-03 ENCOUNTER — Encounter (HOSPITAL_COMMUNITY): Payer: Self-pay

## 2018-06-03 DIAGNOSIS — J069 Acute upper respiratory infection, unspecified: Secondary | ICD-10-CM | POA: Insufficient documentation

## 2018-06-03 DIAGNOSIS — B9789 Other viral agents as the cause of diseases classified elsewhere: Secondary | ICD-10-CM | POA: Insufficient documentation

## 2018-06-03 LAB — POCT RAPID STREP A: Streptococcus, Group A Screen (Direct): NEGATIVE

## 2018-06-03 MED ORDER — HYDROCODONE-HOMATROPINE 5-1.5 MG/5ML PO SYRP: 5.0000 mL | ORAL_SOLUTION | Freq: Every day | ORAL | 0 refills | Status: DC

## 2018-06-03 MED ORDER — PREDNISONE 50 MG PO TABS: 50.0000 mg | ORAL_TABLET | Freq: Every day | ORAL | 0 refills | Status: AC

## 2018-06-03 MED ORDER — ALBUTEROL SULFATE HFA 108 (90 BASE) MCG/ACT IN AERS: 1.0000 | INHALATION_SPRAY | Freq: Four times a day (QID) | RESPIRATORY_TRACT | 0 refills | Status: DC | PRN

## 2018-06-03 NOTE — ED Triage Notes (Signed)
Pt cc coughing , left ear pain and congestion.and sore throat. Pt has taken hall cough drops, advil and  delsym today.

## 2018-06-03 NOTE — Discharge Instructions (Signed)
You likely having a viral upper respiratory infection. We recommended symptom control. I expect your symptoms to start improving in the next 1-2 weeks.   1. Take a daily allergy pill/anti-histamine like Zyrtec, Claritin, or Store brand consistently for 2 weeks  2. For congestion you may try an oral decongestant like Mucinex. You may also try intranasal flonase nasal spray or saline irrigations (neti pot, sinus cleanse)  3. For your sore throat you may try cepacol lozenges, salt water gargles, throat spray. Treatment of congestion may also help your sore throat.  4. For cough you may try Delsym during the day, Hycodan at night  5. Take Tylenol or Ibuprofen to help with pain/inflammation  6. Stay hydrated, drink plenty of fluids to keep throat coated and less irritated  Honey Tea For cough/sore throat try using a honey-based tea. Use 3 teaspoons of honey with juice squeezed from half lemon. Place shaved pieces of ginger into 1/2-1 cup of water and warm over stove top. Then mix the ingredients and repeat every 4 hours as needed.

## 2018-06-03 NOTE — ED Provider Notes (Signed)
[facility]    CSN: [id number] Arrival date & time: 06/03/18  1701     History   Chief Complaint Chief Complaint  Patient presents with  . Sore Throat    HPI Kaitlyn Bray is a 53 y.o. female history of hypertension, asthma presenting today for evaluation of URI symptoms.  Patient has had cough, left ear discomfort, nasal congestion and sore throat.  Symptoms began 2 to 3 days ago.  She has been trying Hall's cough drops, Advil, Delsym without relief.  She is also been drinking hot liquids help with her throat.  Her sore throat is her main complaint.  She has had pain with swallowing.  Cough is been productive.  Also requesting refill of albuterol.  Denies associated GI upset.  Denies fevers.  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  HPI  Past Medical History:  Diagnosis Date  . Asthma   . Hernia   . Hypertension     Patient Active Problem List   Diagnosis Date Noted  . HTN (hypertension) 04/02/2017  . Asthma 04/02/2017    Past Surgical History:  Procedure Laterality Date  . ABDOMINAL HYSTERECTOMY    . CESAREAN SECTION    . MYOMECTOMY      OB History   No obstetric history on file.      Home Medications    Prior to Admission medications   Medication Sig Start Date End Date Taking? Authorizing Provider  albuterol (PROVENTIL HFA;VENTOLIN HFA) 108 (90 Base) MCG/ACT inhaler Inhale 1-2 puffs into the lungs every 6 (six) hours as needed for wheezing or shortness of breath. 06/03/18   Wieters, Hallie C, PA-C  HYDROcodone-homatropine (HYCODAN) 5-1.5 MG/5ML syrup Take 5 mLs by mouth at bedtime. 06/03/18   Wieters, Hallie C, PA-C  ibuprofen (ADVIL,MOTRIN) 200 MG tablet Take 200 mg by mouth every 6 (six) hours as needed.    [provider]  meclizine (ANTIVERT) 25 MG tablet Take 1 tablet (25 mg total) by mouth 3 (three) times daily as needed for dizziness or nausea. 04/05/18   Emily Filbert, MD  metoprolol tartrate  (LOPRESSOR) 50 MG tablet Take 1 tablet (50 mg total) by mouth 2 (two) times daily. ANNUAL EXAM DUE Jan 2020 02/22/18   Lorre Munroe, NP  phenazopyridine (PYRIDIUM) 200 MG tablet Take 1 tablet (200 mg total) by mouth 3 (three) times daily as needed for pain. 01/15/18   Joni Reining, PA-C  predniSONE (DELTASONE) 50 MG tablet Take 1 tablet (50 mg total) by mouth daily for 5 days. 06/03/18 06/08/18  Wieters, Junius Creamer, PA-C    Family History Family History  Problem Relation Age of Onset  . Hypertension Mother   . Stroke Mother   . Dementia Mother   . Heart disease Mother   . Hyperlipidemia Mother   . COPD Father   . Arthritis Father   . Mental illness Father   . Heart disease Maternal Grandmother   . Heart disease Maternal Grandfather   . Alzheimer's disease Maternal Grandfather   . Uterine cancer Paternal Grandmother     Social History Social History   Tobacco Use  . Smoking status: Never Smoker  . Smokeless tobacco: Never Used  Substance Use Topics  . Alcohol use: No  . Drug use: No     Allergies   Codeine; Penicillins; and Sulfa drugs cross reactors   Review of Systems Review of Systems  Constitutional: Negative for activity change, appetite change,  chills, fatigue and fever.  HENT: Positive for congestion, ear pain, rhinorrhea and sore throat. Negative for sinus pressure and trouble swallowing.   Eyes: Negative for discharge and redness.  Respiratory: Positive for cough. Negative for chest tightness and shortness of breath.   Cardiovascular: Negative for chest pain.  Gastrointestinal: Negative for abdominal pain, diarrhea, nausea and vomiting.  Musculoskeletal: Negative for myalgias.  Skin: Negative for rash.  Neurological: Negative for dizziness, light-headedness and headaches.     Physical Exam Triage Vital Signs ED Triage Vitals  Enc Vitals Group     BP 06/03/18 1802 (!) 169/68     Pulse Rate 06/03/18 1802 70     Resp 06/03/18 1802 18     Temp 06/03/18  1802 98.4 F (36.9 C)     Temp src --      SpO2 06/03/18 1802 98 %     Weight 06/03/18 1805 250 lb (113.4 kg)     Height --      Head Circumference --      Peak Flow --      Pain Score 06/03/18 1805 10     Pain Loc --      Pain Edu? --      Excl. in GC? --    No data found.  Updated Vital Signs BP (!) 169/68 (BP Location: Right Arm)   Pulse 70   Temp 98.4 F (36.9 C)   Resp 18   Wt 250 lb (113.4 kg)   SpO2 98%   BMI 41.60 kg/m   Visual Acuity Right Eye Distance:   Left Eye Distance:   Bilateral Distance:    Right Eye Near:   Left Eye Near:    Bilateral Near:     Physical Exam Vitals signs and nursing note reviewed.  Constitutional:      General: She is not in acute distress.    Appearance: She is well-developed.  HENT:     Head: Normocephalic and atraumatic.     Ears:     Comments: Bilateral ears without tenderness to palpation of external auricle, tragus and mastoid, EAC's without erythema or swelling, TM's with good bony landmarks and cone of light. Non erythematous.    Nose:     Comments: Nasal mucosa erythematous, no swollen turbinates    Mouth/Throat:     Comments: Oral mucosa pink and moist, no tonsillar enlargement or exudate. Posterior pharynx patent and erythematous, no uvula deviation or swelling. Normal phonation. Eyes:     Conjunctiva/sclera: Conjunctivae normal.  Neck:     Musculoskeletal: Neck supple.  Cardiovascular:     Rate and Rhythm: Normal rate and regular rhythm.     Heart sounds: No murmur.  Pulmonary:     Effort: Pulmonary effort is normal. No respiratory distress.     Breath sounds: Normal breath sounds.     Comments: Breathing comfortably at rest, CTABL, no wheezing, rales or other adventitious sounds auscultated Abdominal:     Palpations: Abdomen is soft.     Tenderness: There is no abdominal tenderness.  Skin:    General: Skin is warm and dry.  Neurological:     Mental Status: She is alert.      UC Treatments / Results    Labs (all labs ordered are listed, but only abnormal results are displayed) Labs Reviewed  CULTURE, GROUP A STREP [facility])  POCT RAPID STREP A    EKG None  Radiology No results found.  Procedures Procedures (including critical care time)  Medications  Ordered in UC Medications - No data to display  Initial Impression / Assessment and Plan / UC Course  I have reviewed the triage vital signs and the nursing notes.  Pertinent labs & imaging results that were available during my care of the patient were reviewed by me and considered in my medical decision making (see chart for details).     Strep test negative, URI symptoms x2 to 3 days, most likely viral etiology.  Vital signs stable besides slightly elevated blood pressure.  Will recommend symptomatic and supportive care.  Recommendations below.  Albuterol inhaler refilled, patient wanting to try a trial of prednisone as this is helped her in the past.  Provided Hycodan to use only at bedtime, continue to use Delsym during the day.  Patient has allergy to codeine, is unsure of her tolerance of hydrocodone.  Advised to monitor symptoms of stop causing any issues and follow-up.Discussed strict return precautions. Patient verbalized understanding and is agreeable with plan.  Final Clinical Impressions(s) / UC Diagnoses   Final diagnoses:  Viral URI with cough     Discharge Instructions     You likely having a viral upper respiratory infection. We recommended symptom control. I expect your symptoms to start improving in the next 1-2 weeks.   1. Take a daily allergy pill/anti-histamine like Zyrtec, Claritin, or Store brand consistently for 2 weeks  2. For congestion you may try an oral decongestant like Mucinex. You may also try intranasal flonase nasal spray or saline irrigations (neti pot, sinus cleanse)  3. For your sore throat you may try cepacol lozenges, salt water gargles, throat spray. Treatment of congestion may also help  your sore throat.  4. For cough you may try Delsym during the day, Hycodan at night  5. Take Tylenol or Ibuprofen to help with pain/inflammation  6. Stay hydrated, drink plenty of fluids to keep throat coated and less irritated  Honey Tea For cough/sore throat try using a honey-based tea. Use 3 teaspoons of honey with juice squeezed from half lemon. Place shaved pieces of ginger into 1/2-1 cup of water and warm over stove top. Then mix the ingredients and repeat every 4 hours as needed.   ED Prescriptions    Medication Sig Dispense Auth. Provider   albuterol (PROVENTIL HFA;VENTOLIN HFA) 108 (90 Base) MCG/ACT inhaler Inhale 1-2 puffs into the lungs every 6 (six) hours as needed for wheezing or shortness of breath. 1 Inhaler Wieters, Hallie C, PA-C   HYDROcodone-homatropine (HYCODAN) 5-1.5 MG/5ML syrup Take 5 mLs by mouth at bedtime. 60 mL Wieters, Hallie C, PA-C   predniSONE (DELTASONE) 50 MG tablet Take 1 tablet (50 mg total) by mouth daily for 5 days. 5 tablet Wieters, Hallie C, PA-C     Controlled Substance Prescriptions [place] Controlled Substance Registry consulted? Yes, I have consulted the [place] Controlled Substances Registry for this patient, and feel the risk/benefit ratio today is favorable for proceeding with this prescription for a controlled substance.   Lew Dawes, [place] 06/03/18 [phone]

## 2018-06-05 LAB — CULTURE, GROUP A STREP (THRC)

## 2018-06-30 ENCOUNTER — Other Ambulatory Visit: Payer: Self-pay | Admitting: Internal Medicine

## 2018-07-07 ENCOUNTER — Other Ambulatory Visit: Payer: Self-pay

## 2018-07-07 ENCOUNTER — Encounter (HOSPITAL_COMMUNITY): Payer: Self-pay | Admitting: Emergency Medicine

## 2018-07-07 ENCOUNTER — Ambulatory Visit (HOSPITAL_COMMUNITY)
Admission: EM | Admit: 2018-07-07 | Discharge: 2018-07-07 | Disposition: A | Discharge status: Home / Self Care | Payer: Self-pay | Attending: Internal Medicine | Admitting: Internal Medicine

## 2018-07-07 DIAGNOSIS — R05 Cough: Secondary | ICD-10-CM

## 2018-07-07 DIAGNOSIS — R059 Cough, unspecified: Secondary | ICD-10-CM

## 2018-07-07 DIAGNOSIS — J22 Unspecified acute lower respiratory infection: Secondary | ICD-10-CM

## 2018-07-07 MED ORDER — HYDROCODONE-HOMATROPINE 5-1.5 MG/5ML PO SYRP: 5.0000 mL | ORAL_SOLUTION | Freq: Every evening | ORAL | 0 refills | Status: DC | PRN

## 2018-07-07 MED ORDER — AZITHROMYCIN 250 MG PO TABS: 250.0000 mg | ORAL_TABLET | Freq: Every day | ORAL | 0 refills | Status: DC

## 2018-07-07 MED ORDER — BENZONATATE 200 MG PO CAPS: 200.0000 mg | ORAL_CAPSULE | Freq: Three times a day (TID) | ORAL | 0 refills | Status: AC | PRN

## 2018-07-07 MED ORDER — ALBUTEROL SULFATE HFA 108 (90 BASE) MCG/ACT IN AERS: 1.0000 | INHALATION_SPRAY | Freq: Four times a day (QID) | RESPIRATORY_TRACT | 0 refills | Status: DC | PRN

## 2018-07-07 NOTE — ED Provider Notes (Addendum)
[facility]    CSN: [id number] Arrival date & time: 07/07/18  1411     History   Chief Complaint Chief Complaint  Patient presents with  . Cough  . Appointment    HPI Kaitlyn Bray is a 53 y.o. female history of hypertension, asthma presenting today for evaluation of cough.  States that she has had cough and congestion for approximately 1 week.  Over the past 3 to 4 days she has felt like symptoms move more towards her chest and has started to have some chest tightness.  She denies any wheezing at this time, but has felt as if she needs to use her inhaler more frequently.  She states that she has had recurrent bronchitis and symptoms feel very similar to this today.  She denies any fevers.  Has started to have some hoarseness and slight sore throat.  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  Feels rundown from symptoms being persistent.  Denies smoking history, but states that she grew up in a house with cigarette smoke. HPI  Past Medical History:  Diagnosis Date  . Asthma   . Hernia   . Hypertension     Patient Active Problem List   Diagnosis Date Noted  . HTN (hypertension) 04/02/2017  . Asthma 04/02/2017    Past Surgical History:  Procedure Laterality Date  . ABDOMINAL HYSTERECTOMY    . CESAREAN SECTION    . MYOMECTOMY      OB History   No obstetric history on file.      Home Medications    Prior to Admission medications   Medication Sig Start Date End Date Taking? Authorizing Provider  fluticasone (FLONASE) 50 MCG/ACT nasal spray Place into both nostrils daily.   Yes [provider]  ibuprofen (ADVIL,MOTRIN) 200 MG tablet Take 200 mg by mouth every 6 (six) hours as needed.   Yes [provider]  meclizine (ANTIVERT) 25 MG tablet Take 1 tablet (25 mg total) by mouth 3 (three) times daily as needed for dizziness or nausea. 04/05/18  Yes Emily Filbert, MD  metoprolol tartrate  (LOPRESSOR) 50 MG tablet TAKE 1 TABLET BY MOUTH TWICE A DAY NEEDS EXAM IN JAN FOR REFILLS 07/01/18  Yes Lorre Munroe, NP  albuterol (PROVENTIL HFA;VENTOLIN HFA) 108 (90 Base) MCG/ACT inhaler Inhale 1-2 puffs into the lungs every 6 (six) hours as needed for wheezing or shortness of breath. 07/07/18   Lamario Mani C, PA-C  azithromycin (ZITHROMAX) 250 MG tablet Take 1 tablet (250 mg total) by mouth daily. Take first 2 tablets together, then 1 every day until finished. 07/07/18   Mimie Goering C, PA-C  benzonatate (TESSALON) 200 MG capsule Take 1 capsule (200 mg total) by mouth 3 (three) times daily as needed for up to 7 days for cough. 07/07/18 07/14/18  Kyisha Fowle C, PA-C  HYDROcodone-homatropine (HYCODAN) 5-1.5 MG/5ML syrup Take 5 mLs by mouth at bedtime as needed for cough. 07/07/18   Meghana Tullo, Junius Creamer, PA-C    Family History Family History  Problem Relation Age of Onset  . Hypertension Mother   . Stroke Mother   . Dementia Mother   . Heart disease Mother   . Hyperlipidemia Mother   . COPD Father   . Arthritis Father   . Mental illness Father   . Heart disease Maternal Grandmother   . Heart disease Maternal Grandfather   . Alzheimer's disease Maternal Grandfather   .  Uterine cancer Paternal Grandmother     Social History Social History   Tobacco Use  . Smoking status: Never Smoker  . Smokeless tobacco: Never Used  Substance Use Topics  . Alcohol use: No  . Drug use: No     Allergies   Codeine; Penicillins; and Sulfa drugs cross reactors   Review of Systems Review of Systems  Constitutional: Positive for fatigue. Negative for activity change, appetite change, chills and fever.  HENT: Positive for congestion, rhinorrhea and sore throat. Negative for ear pain, sinus pressure and trouble swallowing.   Eyes: Negative for discharge and redness.  Respiratory: Positive for cough and chest tightness. Negative for shortness of breath.   Cardiovascular: Negative for chest  pain.  Gastrointestinal: Negative for abdominal pain, diarrhea, nausea and vomiting.  Musculoskeletal: Negative for myalgias.  Skin: Negative for rash.  Neurological: Negative for dizziness, light-headedness and headaches.     Physical Exam Triage Vital Signs ED Triage Vitals  Enc Vitals Group     BP 07/07/18 1442 (!) 142/86     Pulse Rate 07/07/18 1442 89     Resp 07/07/18 1442 20     Temp 07/07/18 1442 99.2 F (37.3 C)     Temp Source 07/07/18 1442 Oral     SpO2 07/07/18 1442 98 %     Weight --      Height --      Head Circumference --      Peak Flow --      Pain Score 07/07/18 1441 0     Pain Loc --      Pain Edu? --      Excl. in GC? --    No data found.  Updated Vital Signs BP (!) 142/86 (BP Location: Left Arm)   Pulse 89   Temp 99.2 F (37.3 C) (Oral)   Resp 20   SpO2 98%   Visual Acuity Right Eye Distance:   Left Eye Distance:   Bilateral Distance:    Right Eye Near:   Left Eye Near:    Bilateral Near:     Physical Exam Vitals signs and nursing note reviewed.  Constitutional:      General: She is not in acute distress.    Appearance: She is well-developed.  HENT:     Head: Normocephalic and atraumatic.     Ears:     Comments: Bilateral ears without tenderness to palpation of external auricle, tragus and mastoid, EAC's without erythema or swelling, TM's with good bony landmarks and cone of light. Non erythematous-right TM vasculature appears inflamed and erythematous, but overall TM nonerythematous.    Mouth/Throat:     Comments: Oral mucosa pink and moist, no tonsillar enlargement or exudate. Posterior pharynx patent and nonerythematous, no uvula deviation or swelling. Normal phonation. Eyes:     Conjunctiva/sclera: Conjunctivae normal.  Neck:     Musculoskeletal: Neck supple.  Cardiovascular:     Rate and Rhythm: Normal rate and regular rhythm.     Heart sounds: Murmur present.     Comments: Holosystolic murmur Pulmonary:     Effort:  Pulmonary effort is normal. No respiratory distress.     Breath sounds: Normal breath sounds.     Comments: Breathing comfortably at rest, CTABL, no wheezing, rales or other adventitious sounds auscultated Abdominal:     Palpations: Abdomen is soft.     Tenderness: There is no abdominal tenderness.  Skin:    General: Skin is warm and dry.  Neurological:  Mental Status: She is alert.      UC Treatments / Results  Labs (all labs ordered are listed, but only abnormal results are displayed) Labs Reviewed - No data to display  EKG None  Radiology No results found.  Procedures Procedures (including critical care time)  Medications Ordered in UC Medications - No data to display  Initial Impression / Assessment and Plan / UC Course  I have reviewed the triage vital signs and the nursing notes.  Pertinent labs & imaging results that were available during my care of the patient were reviewed by me and considered in my medical decision making (see chart for details).     Vital signs stable in clinic, lungs clear without wheezing, likely lingering viral illness but given length of symptoms will go ahead and cover for azithromycin to cover atypicals in lungs, will refill albuterol inhaler as needed.  Hycodan at bedtime as this previously helped her as well as Tessalon during the day.  Continue symptomatic management of Zyrtec and Flonase for congestion.  Continue to monitor,Discussed strict return precautions. Patient verbalized understanding and is agreeable with plan.  Final Clinical Impressions(s) / UC Diagnoses   Final diagnoses:  Lower respiratory infection (e.g., bronchitis, pneumonia, pneumonitis, pulmonitis)  Cough     Discharge Instructions     Begin azithromycin Ccontinue Delsym/Zarbees during the day Continue flonase, zyrtec for congestion/drainage May use hycodan at night time- do not drive or work after taking as it will cause drowsiness Albuterol refilled if  needed  For sore throat try using a honey-based tea. Use 3 teaspoons of honey with juice squeezed from half lemon. Place shaved pieces of ginger into 1/2-1 cup of water and warm over stove top. Then mix the ingredients and repeat every 4 hours as needed.  Please follow-up if developing worsening cough, shortness of breath, difficulty breathing, chest discomfort, fevers, persistent symptoms    ED Prescriptions    Medication Sig Dispense Auth. Provider   albuterol (PROVENTIL HFA;VENTOLIN HFA) 108 (90 Base) MCG/ACT inhaler Inhale 1-2 puffs into the lungs every 6 (six) hours as needed for wheezing or shortness of breath. 1 Inhaler Leylanie Woodmansee C, PA-C   HYDROcodone-homatropine (HYCODAN) 5-1.5 MG/5ML syrup Take 5 mLs by mouth at bedtime as needed for cough. 60 mL Thane Age C, PA-C   azithromycin (ZITHROMAX) 250 MG tablet Take 1 tablet (250 mg total) by mouth daily. Take first 2 tablets together, then 1 every day until finished. 6 tablet Brelan Hannen C, PA-C   benzonatate (TESSALON) 200 MG capsule Take 1 capsule (200 mg total) by mouth 3 (three) times daily as needed for up to 7 days for cough. 28 capsule Lanesha Azzaro C, PA-C     Controlled Substance Prescriptions [place] Controlled Substance Registry consulted? Not Applicable   Lew Dawes, PA-C 07/07/18 1509    Lew Dawes, [place] 07/07/18 1536

## 2018-07-07 NOTE — ED Triage Notes (Signed)
The patient presented to the [facility] with a complaint of a cough x 1 week. 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

## 2018-07-07 NOTE — Discharge Instructions (Signed)
Begin azithromycin Ccontinue Delsym/Zarbees during the day Continue flonase, zyrtec for congestion/drainage May use hycodan at night time- do not drive or work after taking as it will cause drowsiness Albuterol refilled if needed  For sore throat try using a honey-based tea. Use 3 teaspoons of honey with juice squeezed from half lemon. Place shaved pieces of ginger into 1/2-1 cup of water and warm over stove top. Then mix the ingredients and repeat every 4 hours as needed.  Please follow-up if developing worsening cough, shortness of breath, difficulty breathing, chest discomfort, fevers, persistent symptoms

## 2018-08-05 ENCOUNTER — Encounter: Payer: Self-pay | Admitting: Internal Medicine

## 2018-08-16 NOTE — Telephone Encounter (Signed)
Error

## 2018-09-30 ENCOUNTER — Encounter: Payer: Self-pay | Admitting: Internal Medicine

## 2018-10-09 ENCOUNTER — Other Ambulatory Visit: Payer: Self-pay | Admitting: Internal Medicine

## 2018-10-10 NOTE — Telephone Encounter (Signed)
Pt left v/m has 4 pills left of metoprolol 50 mg and request 90 day refill to [facility]. Pt request cb.

## 2018-10-11 NOTE — Telephone Encounter (Signed)
Best number [phone]  Pt called checking on her rx she is about to run out of her meds  [facility] [address]   Please advise when this has been called in

## 2018-12-03 ENCOUNTER — Encounter (HOSPITAL_COMMUNITY): Payer: Self-pay

## 2018-12-03 ENCOUNTER — Other Ambulatory Visit: Payer: Self-pay

## 2018-12-03 ENCOUNTER — Ambulatory Visit (HOSPITAL_COMMUNITY)
Admission: EM | Admit: 2018-12-03 | Discharge: 2018-12-03 | Disposition: A | Discharge status: Home / Self Care | Payer: Self-pay | Attending: Family Medicine | Admitting: Family Medicine

## 2018-12-03 DIAGNOSIS — I10 Essential (primary) hypertension: Secondary | ICD-10-CM

## 2018-12-03 DIAGNOSIS — R42 Dizziness and giddiness: Secondary | ICD-10-CM

## 2018-12-03 MED ORDER — MECLIZINE HCL 25 MG PO TABS: 25.0000 mg | ORAL_TABLET | Freq: Three times a day (TID) | ORAL | 1 refills | Status: DC | PRN

## 2018-12-03 NOTE — Discharge Instructions (Addendum)
Continue to take your allergy medicine daily Drink plenty of fluids Take the meclizine as needed for dizziness Do not drive while dizzy Continue on your current blood pressure medication Follow-up with your primary care doctor

## 2018-12-03 NOTE — ED Provider Notes (Signed)
[facility]    CSN: [id number] Arrival date & time: 12/03/18  1247      History   Chief Complaint Chief Complaint  Patient presents with  . Dizziness    HPI Kaitlyn Bray is a 53 y.o. female.   HPI  Patient admits that she is under a lot of stress at home and at work.  Her husband just lost her job.  She works in [employer] and with COVID-19 and irregular children school schedules, she is on certain about keeping her job.  She has hypertension.  Takes Lopressor twice a day.  Usually this works well for her.  Over the weekend she had a headache.  Her blood pressure was elevated.  She had some allergy symptoms.  She woke up yesterday feeling slightly lightheaded.  When she lays in the bed and turns her head she gets a spinning sensation with nausea.  She recognizes this is vertigo.  She has had vertigo before.  This was treated with meclizine.  She is out of meclizine at this time. No recent cough or cold symptoms, fever or body aches.  No known exposure to COVID-19  Past Medical History:  Diagnosis Date  . Asthma   . Hernia   . Hypertension     Patient Active Problem List   Diagnosis Date Noted  . HTN (hypertension) 04/02/2017  . Asthma 04/02/2017    Past Surgical History:  Procedure Laterality Date  . ABDOMINAL HYSTERECTOMY    . CESAREAN SECTION    . MYOMECTOMY      OB History   No obstetric history on file.      Home Medications    Prior to Admission medications   Medication Sig Start Date End Date Taking? Authorizing Provider  albuterol (PROVENTIL HFA;VENTOLIN HFA) 108 (90 Base) MCG/ACT inhaler Inhale 1-2 puffs into the lungs every 6 (six) hours as needed for wheezing or shortness of breath. 07/07/18   Wieters, Hallie C, PA-C  fluticasone (FLONASE) 50 MCG/ACT nasal spray Place into both nostrils daily.    [provider]  ibuprofen (ADVIL,MOTRIN) 200 MG tablet Take 200 mg by mouth every 6 (six) hours as needed.    [provider]   meclizine (ANTIVERT) 25 MG tablet Take 1 tablet (25 mg total) by mouth 3 (three) times daily as needed for dizziness or nausea. 12/03/18   Eustace MooreNelson, Bradd Merlos Sue, MD  metoprolol tartrate (LOPRESSOR) 50 MG tablet TAKE 1 TABLET BY MOUTH TWICE A DAY 10/11/18   Lorre MunroeBaity, Regina W, NP    Family History Family History  Problem Relation Age of Onset  . Hypertension Mother   . Stroke Mother   . Dementia Mother   . Heart disease Mother   . Hyperlipidemia Mother   . COPD Father   . Arthritis Father   . Mental illness Father   . Heart disease Maternal Grandmother   . Heart disease Maternal Grandfather   . Alzheimer's disease Maternal Grandfather   . Uterine cancer Paternal Grandmother     Social History Social History   Tobacco Use  . Smoking status: Never Smoker  . Smokeless tobacco: Never Used  Substance Use Topics  . Alcohol use: No  . Drug use: No     Allergies   Codeine, Penicillins, and Sulfa drugs cross reactors   Review of Systems Review of Systems  Constitutional: Negative for chills and fever.  HENT: Negative for ear pain and sore throat.   Eyes: Negative for pain and visual disturbance.  Respiratory: Negative for cough and shortness of breath.   Cardiovascular: Negative for chest pain and palpitations.  Gastrointestinal: Negative for abdominal pain and vomiting.  Genitourinary: Negative for dysuria and hematuria.  Musculoskeletal: Negative for arthralgias and back pain.  Skin: Negative for color change and rash.  Neurological: Positive for dizziness and headaches. Negative for seizures and syncope.  All other systems reviewed and are negative.    Physical Exam Triage Vital Signs ED Triage Vitals  Enc Vitals Group     BP 12/03/18 1307 (!) 155/97     Pulse Rate 12/03/18 1307 67     Resp 12/03/18 1307 17     Temp 12/03/18 1307 98.4 F (36.9 C)     Temp Source 12/03/18 1307 Oral     SpO2 12/03/18 1307 100 %     Weight --      Height --      Head Circumference --       Peak Flow --      Pain Score 12/03/18 1305 0     Pain Loc --      Pain Edu? --      Excl. in GC? --    No data found.  Updated Vital Signs BP (!) 155/97 (BP Location: Left Arm)   Pulse 67   Temp 98.4 F (36.9 C) (Oral)   Resp 17   SpO2 100%      Physical Exam Constitutional:      General: She is not in acute distress.    Appearance: She is well-developed. She is obese.  HENT:     Head: Normocephalic and atraumatic.     Right Ear: Tympanic membrane, ear canal and external ear normal.     Left Ear: Tympanic membrane, ear canal and external ear normal.     Nose: Nose normal. No congestion.     Mouth/Throat:     Mouth: Mucous membranes are moist.     Pharynx: No posterior oropharyngeal erythema.  Eyes:     Conjunctiva/sclera: Conjunctivae normal.     Pupils: Pupils are equal, round, and reactive to light.     Comments: Estropia on the left  Neck:     Musculoskeletal: Normal range of motion.  Cardiovascular:     Rate and Rhythm: Normal rate and regular rhythm.     Heart sounds: Normal heart sounds.  Pulmonary:     Effort: Pulmonary effort is normal. No respiratory distress.     Breath sounds: Normal breath sounds.  Abdominal:     General: There is no distension.     Palpations: Abdomen is soft.  Musculoskeletal: Normal range of motion.  Skin:    General: Skin is warm and dry.  Neurological:     Mental Status: She is alert.  Psychiatric:        Mood and Affect: Mood normal.        Behavior: Behavior normal.     Comments: Slightly anxious mood, talkative      UC Treatments / Results  Labs (all labs ordered are listed, but only abnormal results are displayed) Labs Reviewed - No data to display  EKG   Radiology No results found.  Procedures Procedures (including critical care time)  Medications Ordered in UC Medications - No data to display  Initial Impression / Assessment and Plan / UC Course  I have reviewed the triage vital signs and the  nursing notes.  Pertinent labs & imaging results that were available during my care of the patient  were reviewed by me and considered in my medical decision making (see chart for details).     Patient has vertigo.  Blood pressure elevation is mild, likely due to her recent stress.  She is to follow-up with her primary care doctor Final Clinical Impressions(s) / UC Diagnoses   Final diagnoses:  Vertigo  Essential hypertension     Discharge Instructions     Continue to take your allergy medicine daily Drink plenty of fluids Take the meclizine as needed for dizziness Do not drive while dizzy Continue on your current blood pressure medication Follow-up with your primary care doctor   ED Prescriptions    Medication Sig Dispense Auth. Provider   meclizine (ANTIVERT) 25 MG tablet Take 1 tablet (25 mg total) by mouth 3 (three) times daily as needed for dizziness or nausea. 30 tablet Raylene Everts, MD     Controlled Substance Prescriptions [place] Controlled Substance Registry consulted? Not Applicable   Raylene Everts, MD 12/03/18 1332

## 2018-12-03 NOTE — ED Triage Notes (Signed)
Patient presents to Urgent Care with complaints of dizziness since yesterday. Patient reports she has dealt with vertigo on and off, felt dehydrated over the weekend. Would also like ears checked.

## 2018-12-31 ENCOUNTER — Encounter: Payer: Self-pay | Admitting: Internal Medicine

## 2019-01-21 ENCOUNTER — Other Ambulatory Visit: Payer: Self-pay

## 2019-01-21 ENCOUNTER — Emergency Department: Payer: Self-pay

## 2019-01-21 ENCOUNTER — Emergency Department
Admission: EM | Admit: 2019-01-21 | Discharge: 2019-01-21 | Disposition: A | Discharge status: Home / Self Care | Payer: Self-pay | Attending: Emergency Medicine | Admitting: Emergency Medicine

## 2019-01-21 DIAGNOSIS — Y999 Unspecified external cause status: Secondary | ICD-10-CM | POA: Insufficient documentation

## 2019-01-21 DIAGNOSIS — Y92511 Restaurant or cafe as the place of occurrence of the external cause: Secondary | ICD-10-CM | POA: Insufficient documentation

## 2019-01-21 DIAGNOSIS — W19XXXA Unspecified fall, initial encounter: Secondary | ICD-10-CM | POA: Insufficient documentation

## 2019-01-21 DIAGNOSIS — Z79899 Other long term (current) drug therapy: Secondary | ICD-10-CM | POA: Insufficient documentation

## 2019-01-21 DIAGNOSIS — I10 Essential (primary) hypertension: Secondary | ICD-10-CM | POA: Insufficient documentation

## 2019-01-21 DIAGNOSIS — Y939 Activity, unspecified: Secondary | ICD-10-CM | POA: Insufficient documentation

## 2019-01-21 DIAGNOSIS — J45909 Unspecified asthma, uncomplicated: Secondary | ICD-10-CM | POA: Insufficient documentation

## 2019-01-21 DIAGNOSIS — S63502A Unspecified sprain of left wrist, initial encounter: Secondary | ICD-10-CM | POA: Insufficient documentation

## 2019-01-21 NOTE — ED Notes (Signed)
See triage note for this RN's assessment of pt's L arm/wrist.

## 2019-01-21 NOTE — ED Triage Notes (Signed)
Pt in POV from [place] fall in bathroom. States landed on L wrist/arm but can't remember exactly how. Able to move arm and wrist with full movement but makes statement of pain. Slight swelling noted to lateral aspect of wrist. Pulse 2+; equal color/warmth as R wrist. Denies hitting head. Pt states floor was wet and this is why she fell.

## 2019-01-21 NOTE — ED Provider Notes (Signed)
[facility] Emergency Department Provider Note ____________________________________________  Time seen: Approximately 4:22 PM  I have reviewed the triage vital signs and the nursing notes.   HISTORY  Chief Complaint Wrist Pain    HPI Kaitlyn Bray is a 53 y.o. female who presents to the emergency department for evaluation and treatment of left wrist pain after mechanical, non-syncopal fall in the bathroom at [facility].  She states that they floor was wet and she felt herself sliding she reached out to try and help catch herself.  She did not hurt her back or neck.  She did not strike her head.  She has not had any loss of consciousness or other concerns outside of the wrist pain.  No alleviating measures attempted prior to arrival.  Past Medical History:  Diagnosis Date  . Asthma   . Hernia   . Hypertension     Patient Active Problem List   Diagnosis Date Noted  . HTN (hypertension) 04/02/2017  . Asthma 04/02/2017    Past Surgical History:  Procedure Laterality Date  . ABDOMINAL HYSTERECTOMY    . CESAREAN SECTION    . MYOMECTOMY      Prior to Admission medications   Medication Sig Start Date End Date Taking? Authorizing Provider  albuterol (PROVENTIL HFA;VENTOLIN HFA) 108 (90 Base) MCG/ACT inhaler Inhale 1-2 puffs into the lungs every 6 (six) hours as needed for wheezing or shortness of breath. 07/07/18   Wieters, Hallie C, PA-C  fluticasone (FLONASE) 50 MCG/ACT nasal spray Place into both nostrils daily.    [provider]  ibuprofen (ADVIL,MOTRIN) 200 MG tablet Take 200 mg by mouth every 6 (six) hours as needed.    [provider]  meclizine (ANTIVERT) 25 MG tablet Take 1 tablet (25 mg total) by mouth 3 (three) times daily as needed for dizziness or nausea. 12/03/18   Raylene Everts, MD  metoprolol tartrate (LOPRESSOR) 50 MG tablet TAKE 1 TABLET BY MOUTH TWICE A DAY 10/11/18   Jearld Fenton, NP    Allergies Codeine,  Penicillins, and Sulfa drugs cross reactors  Family History  Problem Relation Age of Onset  . Hypertension Mother   . Stroke Mother   . Dementia Mother   . Heart disease Mother   . Hyperlipidemia Mother   . COPD Father   . Arthritis Father   . Mental illness Father   . Heart disease Maternal Grandmother   . Heart disease Maternal Grandfather   . Alzheimer's disease Maternal Grandfather   . Uterine cancer Paternal Grandmother     Social History Social History   Tobacco Use  . Smoking status: Never Smoker  . Smokeless tobacco: Never Used  Substance Use Topics  . Alcohol use: No  . Drug use: No    Review of Systems Constitutional: Negative for fever. Cardiovascular: Negative for chest pain. Respiratory: Negative for shortness of breath. Musculoskeletal: Positive for left wrist pain. Skin: Negative for open wound or lesion Neurological: Negative for decrease in sensation  ____________________________________________   PHYSICAL EXAM:  VITAL SIGNS: ED Triage Vitals  Enc Vitals Group     BP 01/21/19 1605 (!) 169/104     Pulse Rate 01/21/19 1605 85     Resp 01/21/19 1605 19     Temp 01/21/19 1605 98.2 F (36.8 C)     Temp Source 01/21/19 1605 Oral     SpO2 01/21/19 1605 97 %     Weight 01/21/19 1605 250 lb (113.4 kg)  Height 01/21/19 1605 5\' 5"  (1.651 m)     Head Circumference --      Peak Flow --      Pain Score 01/21/19 1612 5     Pain Loc --      Pain Edu? --      Excl. in GC? --     Constitutional: Alert and oriented. Well appearing and in no acute distress. Eyes: Conjunctivae are clear without discharge or drainage Head: Atraumatic Neck: Supple.  No focal midline tenderness. Respiratory: No cough. Respirations are even and unlabored. Musculoskeletal: Swelling and deformity noted over the left radius Neurologic: Motor and sensory function of the fingers of the left hand is intact Skin: No open wounds or lesions over the left upper  extremity Psychiatric: Affect and behavior are appropriate.  ____________________________________________   LABS (all labs ordered are listed, but only abnormal results are displayed)  Labs Reviewed - No data to display ____________________________________________  RADIOLOGY  Image of the left wrist is negative for acute abnormality per radiology. ____________________________________________   PROCEDURES  Procedures  ____________________________________________   INITIAL IMPRESSION / ASSESSMENT AND PLAN / ED COURSE  Kaitlyn Bray is a 53 y.o. who presents to the emergency department for treatment and evaluation after mechanical, non-syncopal fall earlier today.  See HPI for further details.  Image of the wrist is reassuring.  She will be placed in a Velcro cock-up splint for support and compression.  She was advised to rest, ice, elevate the arm.  If she has pain for more than a week, she is to see orthopedics.  She states that she has pain medication at home and will does take that.  She was advised to return to the emergency department for symptoms that change or worsen for new concerns if unable to schedule appointment.   Medications - No data to display  Pertinent labs & imaging results that were available during my care of the patient were reviewed by me and considered in my medical decision making (see chart for details).  _________________________________________   FINAL CLINICAL IMPRESSION(S) / ED DIAGNOSES  Final diagnoses:  Sprain of left wrist, initial encounter    ED Discharge Orders    None       If controlled substance prescribed during this visit, 12 month history viewed on the [facility] prior to issuing an initial prescription for Schedule II or III opiod.   Chinita Pesterriplett, Lura Falor B, FNP 01/21/19 1736    Minna AntisPaduchowski, Kevin, MD 01/21/19 [phone]

## 2019-01-21 NOTE — Discharge Instructions (Signed)
Wear the splint as long as you have pain and swelling. If you need it for more than a week, please follow up with orthopedics.

## 2019-01-23 ENCOUNTER — Other Ambulatory Visit: Payer: Self-pay | Admitting: Internal Medicine

## 2019-02-13 ENCOUNTER — Encounter: Payer: Self-pay | Admitting: Internal Medicine

## 2019-03-20 ENCOUNTER — Other Ambulatory Visit: Payer: Self-pay

## 2019-03-20 ENCOUNTER — Telehealth: Payer: Self-pay | Admitting: Internal Medicine

## 2019-03-20 ENCOUNTER — Ambulatory Visit (INDEPENDENT_AMBULATORY_CARE_PROVIDER_SITE_OTHER): Payer: Self-pay | Admitting: Internal Medicine

## 2019-03-20 ENCOUNTER — Encounter: Payer: Self-pay | Admitting: Internal Medicine

## 2019-03-20 VITALS — BP 142/84 | HR 63 | Temp 98.2°F | Ht 65.0 in | Wt 253.0 lb

## 2019-03-20 DIAGNOSIS — H8110 Benign paroxysmal vertigo, unspecified ear: Secondary | ICD-10-CM | POA: Insufficient documentation

## 2019-03-20 DIAGNOSIS — I10 Essential (primary) hypertension: Secondary | ICD-10-CM

## 2019-03-20 DIAGNOSIS — Z0000 Encounter for general adult medical examination without abnormal findings: Secondary | ICD-10-CM

## 2019-03-20 DIAGNOSIS — H8113 Benign paroxysmal vertigo, bilateral: Secondary | ICD-10-CM

## 2019-03-20 DIAGNOSIS — Z1211 Encounter for screening for malignant neoplasm of colon: Secondary | ICD-10-CM

## 2019-03-20 DIAGNOSIS — Z0001 Encounter for general adult medical examination with abnormal findings: Secondary | ICD-10-CM

## 2019-03-20 DIAGNOSIS — J4520 Mild intermittent asthma, uncomplicated: Secondary | ICD-10-CM

## 2019-03-20 DIAGNOSIS — E559 Vitamin D deficiency, unspecified: Secondary | ICD-10-CM

## 2019-03-20 LAB — COMPREHENSIVE METABOLIC PANEL
ALT: 13 U/L (ref 0–35)
AST: 10 U/L (ref 0–37)
Albumin: 4.2 g/dL (ref 3.5–5.2)
Alkaline Phosphatase: 84 U/L (ref 39–117)
BUN: 14 mg/dL (ref 6–23)
CO2: 32 mEq/L (ref 19–32)
Calcium: 9.1 mg/dL (ref 8.4–10.5)
Chloride: 103 mEq/L (ref 96–112)
Creatinine, Ser: 0.64 mg/dL (ref 0.40–1.20)
GFR: 96.94 mL/min (ref >60.00)
Glucose, Bld: 100 mg/dL — ABNORMAL HIGH (ref 70–99)
Potassium: 4.2 mEq/L (ref 3.5–5.1)
Sodium: 142 mEq/L (ref 135–145)
Total Bilirubin: 0.3 mg/dL (ref 0.2–1.2)
Total Protein: 6.8 g/dL (ref 6.0–8.3)

## 2019-03-20 LAB — CBC
HCT: 42.6 % (ref 36.0–46.0)
Hemoglobin: 13.8 g/dL (ref 12.0–15.0)
MCHC: 32.4 g/dL (ref 30.0–36.0)
MCV: 86.9 fl (ref 78.0–100.0)
Platelets: 232 10*3/uL (ref 150.0–400.0)
RBC: 4.91 Mil/uL (ref 3.87–5.11)
RDW: 14.4 % (ref 11.5–15.5)
WBC: 8.8 10*3/uL (ref 4.0–10.5)

## 2019-03-20 LAB — TSH: TSH: 1.71 u[IU]/mL (ref 0.35–4.50)

## 2019-03-20 LAB — LIPID PANEL
Cholesterol: 189 mg/dL (ref 0–200)
HDL: 42.7 mg/dL (ref >39.00)
LDL Cholesterol: 118 mg/dL — ABNORMAL HIGH (ref 0–99)
NonHDL: 146.33
Total CHOL/HDL Ratio: 4
Triglycerides: 143 mg/dL (ref 0.0–149.0)
VLDL: 28.6 mg/dL (ref 0.0–40.0)

## 2019-03-20 LAB — HEMOGLOBIN A1C: Hgb A1c MFr Bld: 6.2 % (ref 4.6–6.5)

## 2019-03-20 LAB — VITAMIN D 25 HYDROXY (VIT D DEFICIENCY, FRACTURES): VITD: 16.72 ng/mL — ABNORMAL LOW (ref 30.00–100.00)

## 2019-03-20 MED ORDER — ALBUTEROL SULFATE HFA 108 (90 BASE) MCG/ACT IN AERS: 1.0000 | INHALATION_SPRAY | Freq: Four times a day (QID) | RESPIRATORY_TRACT | 2 refills | Status: DC | PRN

## 2019-03-20 MED ORDER — LOSARTAN POTASSIUM-HCTZ 50-12.5 MG PO TABS: 0.5000 | ORAL_TABLET | Freq: Every day | ORAL | 0 refills | Status: DC

## 2019-03-20 MED ORDER — MECLIZINE HCL 25 MG PO TABS: 25.0000 mg | ORAL_TABLET | Freq: Three times a day (TID) | ORAL | 1 refills | Status: DC | PRN

## 2019-03-20 NOTE — Assessment & Plan Note (Signed)
Uncontrolled on Metoprolol, will d/c Will start Losartan HCT 50-12.5 mg (1/2 tab daily) CMET today Reinforced DASH diet and exercise for weight loss  RTC in 3 weeks for follow up HTN

## 2019-03-20 NOTE — Assessment & Plan Note (Signed)
Continue Albuterol prn, refilled today Will monitor

## 2019-03-20 NOTE — Telephone Encounter (Signed)
Pt is requesting letter with lab results once resulted. States that she does not have voicemail on her phone and she will most likely miss your call. No Mychart set up.   Please mail results to  [place] [id number]

## 2019-03-20 NOTE — Patient Instructions (Signed)
Health Maintenance, Female Adopting a healthy lifestyle and getting preventive care are important in promoting health and wellness. Ask your health care provider about:  The right schedule for you to have regular tests and exams.  Things you can do on your own to prevent diseases and keep yourself healthy. What should I know about diet, weight, and exercise? Eat a healthy diet   Eat a diet that includes plenty of vegetables, fruits, low-fat dairy products, and lean protein.  Do not eat a lot of foods that are high in solid fats, added sugars, or sodium. Maintain a healthy weight Body mass index (BMI) is used to identify weight problems. It estimates body fat based on height and weight. Your health care provider can help determine your BMI and help you achieve or maintain a healthy weight. Get regular exercise Get regular exercise. This is one of the most important things you can do for your health. Most adults should:  Exercise for at least 150 minutes each week. The exercise should increase your heart rate and make you sweat (moderate-intensity exercise).  Do strengthening exercises at least twice a week. This is in addition to the moderate-intensity exercise.  Spend less time sitting. Even light physical activity can be beneficial. Watch cholesterol and blood lipids Have your blood tested for lipids and cholesterol at 53 years of age, then have this test every 5 years. Have your cholesterol levels checked more often if:  Your lipid or cholesterol levels are high.  You are older than 53 years of age.  You are at high risk for heart disease. What should I know about cancer screening? Depending on your health history and family history, you may need to have cancer screening at various ages. This may include screening for:  Breast cancer.  Cervical cancer.  Colorectal cancer.  Skin cancer.  Lung cancer. What should I know about heart disease, diabetes, and high blood  pressure? Blood pressure and heart disease  High blood pressure causes heart disease and increases the risk of stroke. This is more likely to develop in people who have high blood pressure readings, are of African descent, or are overweight.  Have your blood pressure checked: ? Every 3-5 years if you are 18-39 years of age. ? Every year if you are 40 years old or older. Diabetes Have regular diabetes screenings. This checks your fasting blood sugar level. Have the screening done:  Once every three years after age 40 if you are at a normal weight and have a low risk for diabetes.  More often and at a younger age if you are overweight or have a high risk for diabetes. What should I know about preventing infection? Hepatitis B If you have a higher risk for hepatitis B, you should be screened for this virus. Talk with your health care provider to find out if you are at risk for hepatitis B infection. Hepatitis C Testing is recommended for:  Everyone born from 1945 through 1965.  Anyone with known risk factors for hepatitis C. Sexually transmitted infections (STIs)  Get screened for STIs, including gonorrhea and chlamydia, if: ? You are sexually active and are younger than 53 years of age. ? You are older than 53 years of age and your health care provider tells you that you are at risk for this type of infection. ? Your sexual activity has changed since you were last screened, and you are at increased risk for chlamydia or gonorrhea. Ask your health care provider if   you are at risk.  Ask your health care provider about whether you are at high risk for HIV. Your health care provider may recommend a prescription medicine to help prevent HIV infection. If you choose to take medicine to prevent HIV, you should first get tested for HIV. You should then be tested every 3 months for as long as you are taking the medicine. Pregnancy  If you are about to stop having your period (premenopausal) and  you may become pregnant, seek counseling before you get pregnant.  Take 400 to 800 micrograms (mcg) of folic acid every day if you become pregnant.  Ask for birth control (contraception) if you want to prevent pregnancy. Osteoporosis and menopause Osteoporosis is a disease in which the bones lose minerals and strength with aging. This can result in bone fractures. If you are 65 years old or older, or if you are at risk for osteoporosis and fractures, ask your health care provider if you should:  Be screened for bone loss.  Take a calcium or vitamin D supplement to lower your risk of fractures.  Be given hormone replacement therapy (HRT) to treat symptoms of menopause. Follow these instructions at home: Lifestyle  Do not use any products that contain nicotine or tobacco, such as cigarettes, e-cigarettes, and chewing tobacco. If you need help quitting, ask your health care provider.  Do not use street drugs.  Do not share needles.  Ask your health care provider for help if you need support or information about quitting drugs. Alcohol use  Do not drink alcohol if: ? Your health care provider tells you not to drink. ? You are pregnant, may be pregnant, or are planning to become pregnant.  If you drink alcohol: ? Limit how much you use to 0-1 drink a day. ? Limit intake if you are breastfeeding.  Be aware of how much alcohol is in your drink. In the [place] one drink equals one 12 oz bottle of beer (355 mL), one 5 oz glass of wine (148 mL), or one 1 oz glass of hard liquor (44 mL). General instructions  Schedule regular health, dental, and eye exams.  Stay current with your vaccines.  Tell your health care provider if: ? You often feel depressed. ? You have ever been abused or do not feel safe at home. Summary  Adopting a healthy lifestyle and getting preventive care are important in promoting health and wellness.  Follow your health care provider's instructions about healthy  diet, exercising, and getting tested or screened for diseases.  Follow your health care provider's instructions on monitoring your cholesterol and blood pressure. This information is not intended to replace advice given to you by your health care provider. Make sure you discuss any questions you have with your health care provider. Document Released: 11/14/2010 Document Revised: 04/24/2018 Document Reviewed: 04/24/2018 Elsevier Patient Education  2020 [employer]  

## 2019-03-20 NOTE — Assessment & Plan Note (Addendum)
Advised her to try the Epley maneuver when this flares up Meclizine refilled today Referral to ENT per pt request

## 2019-03-20 NOTE — Progress Notes (Signed)
Subjective:    Patient ID: Kaitlyn Bray, female    DOB: Sep 22, 1965, 53 y.o.   MRN: [id number]  HPI  Pt presents to the clinic today for her annual exam. She is also due for follow up of chronic conditions.  HTN: Her BP today is 142/84. She is taking Metoprolol as prescribed. ECG from 03/2018 reviewed.  Asthma: Mild intermittent. No new issues reported. She uses her Albuterol rarely. There are no PFT's on file.  BPPV: Patient complains of intermittent dizziness with head movements. She takes Meclizine as needed with good relief. She would like referral to ENT for further evaluation.   Flu: never Tetanus: 05/2017 Pap Smear: 03/2008, partial hysterectomy Mammogram: 2019 Colon Screening: never Vision Screening: As needed Dentist: As needed  Diet: She does eat meats. She consumes fruits and veggies. She also eats fried foods. She drinks mostly water and occasionally sweet tea.  Exercise: None    Review of Systems      Past Medical History:  Diagnosis Date   Asthma    Hernia    Hypertension     Current Outpatient Medications  Medication Sig Dispense Refill   albuterol (PROVENTIL HFA;VENTOLIN HFA) 108 (90 Base) MCG/ACT inhaler Inhale 1-2 puffs into the lungs every 6 (six) hours as needed for wheezing or shortness of breath. 1 Inhaler 0   fluticasone (FLONASE) 50 MCG/ACT nasal spray Place into both nostrils daily.     ibuprofen (ADVIL,MOTRIN) 200 MG tablet Take 200 mg by mouth every 6 (six) hours as needed.     meclizine (ANTIVERT) 25 MG tablet Take 1 tablet (25 mg total) by mouth 3 (three) times daily as needed for dizziness or nausea. 30 tablet 1   metoprolol tartrate (LOPRESSOR) 50 MG tablet TAKE 1 TABLET BY MOUTH TWICE A DAY 180 tablet 0   No current facility-administered medications for this visit.     Allergies  Allergen Reactions   Codeine     Fast heart rate   Penicillins Rash   Sulfa Drugs Cross Reactors Rash    Family History  Problem Relation  Age of Onset   Hypertension Mother    Stroke Mother    Dementia Mother    Heart disease Mother    Hyperlipidemia Mother    COPD Father    Arthritis Father    Mental illness Father    Heart disease Maternal Grandmother    Heart disease Maternal Grandfather    Alzheimer's disease Maternal Grandfather    Uterine cancer Paternal Grandmother     Social History   Socioeconomic History   Marital status: Married    Spouse name: Not on file   Number of children: Not on file   Years of education: Not on file   Highest education level: Not on file  Occupational History   Not on file  Social Needs   Financial resource strain: Not on file   Food insecurity    Worry: Not on file    Inability: Not on file   Transportation needs    Medical: Not on file    Non-medical: Not on file  Tobacco Use   Smoking status: Never Smoker   Smokeless tobacco: Never Used  Substance and Sexual Activity   Alcohol use: No   Drug use: No   Sexual activity: Not on file  Lifestyle   Physical activity    Days per week: Not on file    Minutes per session: Not on file   Stress: Not on file  Relationships   Social Musician on phone: Not on file    Gets together: Not on file    Attends religious service: Not on file    Active member of club or organization: Not on file    Attends meetings of clubs or organizations: Not on file    Relationship status: Not on file   Intimate partner violence    Fear of current or ex partner: Not on file    Emotionally abused: Not on file    Physically abused: Not on file    Forced sexual activity: Not on file  Other Topics Concern   Not on file  Social History Narrative   Not on file     Constitutional: Denies fever, malaise, fatigue, headache or abrupt weight changes.  HEENT: Denies eye pain, eye redness, ear pain, ringing in the ears, wax buildup, runny nose, nasal congestion, bloody nose, or sore throat. Respiratory:  Denies difficulty breathing, shortness of breath, cough or sputum production.   Cardiovascular: Denies chest pain, chest tightness, palpitations or swelling in the hands or feet.  Gastrointestinal: Pt reports lower abdominal hernia. Denies abdominal pain, bloating, constipation, diarrhea or blood in the stool.  GU: Denies urgency, frequency, pain with urination, burning sensation, blood in urine, odor or discharge. Musculoskeletal: Denies decrease in range of motion, difficulty with gait, muscle pain or joint pain and swelling.  Skin: Denies redness, rashes, lesions or ulcercations.  Neurological: Pt reports intermittent dizziness. Denies difficulty with memory, difficulty with speech or problems with balance and coordination.  Psych: Denies anxiety, depression, SI/HI.  No other specific complaints in a complete review of systems (except as listed in HPI above).  Objective:   Physical Exam  BP (!) 142/84    Pulse 63    Temp 98.2 F (36.8 C) (Temporal)    Ht 5\' 5"  (1.651 m)    Wt 253 lb (114.8 kg)    SpO2 98%    BMI 42.10 kg/m   Wt Readings from Last 3 Encounters:  01/21/19 113.4 kg  06/03/18 113.4 kg  04/05/18 111.1 kg    General: Appears her stated age, obese, in NAD. Skin: Warm, dry and intact. No rashes, lesions or ulcerations noted. HEENT: Head: normal shape and size; Eyes: sclera white, no icterus, conjunctiva pink and EOMs intact. Left eye strabismus noted. Neck:  Neck supple, trachea midline. No masses, lumps or thyromegaly present.  Cardiovascular: Normal rate and rhythm. S1,S2 noted.  No murmur, rubs or gallops noted. No JVD or BLE edema. No carotid bruits noted. Pulmonary/Chest: Normal effort and positive vesicular breath sounds. No respiratory distress. No wheezes, rales or ronchi noted.  Abdomen: Hernia noted to inferior and lateral to umbilicus. Soft and nontender. Normal bowel sounds. Liver, spleen and kidneys non palpable. Musculoskeletal: Strength 5/5 BUE/BLE. No  difficulty with gait.  Neurological: Alert and oriented. Cranial nerves II-XII grossly intact. Coordination normal.  Psychiatric: Mood and affect normal. Behavior is normal. Judgment and thought content normal.     BMET    Component Value Date/Time   NA 141 04/05/2018 1031   K 3.9 04/05/2018 1031   CL 102 04/05/2018 1031   CO2 30 04/05/2018 1031   GLUCOSE 108 (H) 04/05/2018 1031   BUN 14 04/05/2018 1031   CREATININE 0.59 04/05/2018 1031   CALCIUM 9.2 04/05/2018 1031   GFRNONAA >60 04/05/2018 1031   GFRAA >60 04/05/2018 1031    Lipid Panel     Component Value Date/Time   CHOL  172 05/21/2017 0841   TRIG 223.0 (H) 05/21/2017 0841   HDL 41.60 05/21/2017 0841   CHOLHDL 4 05/21/2017 0841   VLDL 44.6 (H) 05/21/2017 0841    CBC    Component Value Date/Time   WBC 9.6 04/05/2018 1031   RBC 4.79 04/05/2018 1031   HGB 13.7 04/05/2018 1031   HCT 41.8 04/05/2018 1031   PLT 257 04/05/2018 1031   MCV 87.3 04/05/2018 1031   MCH 28.6 04/05/2018 1031   MCHC 32.8 04/05/2018 1031   RDW 13.2 04/05/2018 1031   LYMPHSABS 2.1 03/03/2013 1216   MONOABS 0.4 03/03/2013 1216   EOSABS 0.2 03/03/2013 1216   BASOSABS 0.1 03/03/2013 1216    Hgb A1C Lab Results  Component Value Date   HGBA1C 6.2 05/21/2017           Assessment & Plan:  Encounter for Health Maintenance:  She declines flu shot today- self pay Tetanus UTD She declines pelvic exam today, no longer needs pap smear She will call to schedule her mammogram Referral placed to GI for screening colonoscopy Encouraged her to consume a balanced diet and exercise regimen Advised her to see an eye doctor and dentist annually Will check CBC, CMET, TSH, Lipid, A1C and Vit D today  RTC in 3 weeks for follow up of 3 weeks for nurse visit for BP check Nicki Reaperegina Shyloh Derosa, NP

## 2019-03-26 MED ORDER — VITAMIN D (ERGOCALCIFEROL) 1.25 MG (50000 UNIT) PO CAPS: 50000.0000 [IU] | ORAL_CAPSULE | ORAL | 0 refills | Status: DC

## 2019-03-26 NOTE — Addendum Note (Signed)
Addended by: Lurlean Nanny on: 03/26/2019 11:08 AM   Modules accepted: Orders

## 2019-03-28 ENCOUNTER — Telehealth: Payer: Self-pay

## 2019-03-28 NOTE — Telephone Encounter (Signed)
Pt has not received lab results in mail yet and pt wanted to verify had been mailed and what lab results were. Pt notified as instructed and voiced understanding and will pick up Vit D 50000 units as soon as she can. Nothing further needed.

## 2019-04-07 ENCOUNTER — Encounter: Payer: Self-pay | Admitting: Internal Medicine

## 2019-04-09 ENCOUNTER — Other Ambulatory Visit: Payer: Self-pay

## 2019-04-09 ENCOUNTER — Ambulatory Visit: Payer: Self-pay

## 2019-04-09 VITALS — BP 140/88 | HR 74

## 2019-04-09 DIAGNOSIS — I10 Essential (primary) hypertension: Secondary | ICD-10-CM

## 2019-04-09 NOTE — Progress Notes (Signed)
Per Lonzo Cloud encounter order on 03/20/19, patient presents today for a nurse visit blood pressure check for ongoing follow up and management.  Vital Sign Readings today BP 140/88, P 74.  Pt states she is taking bp meds in the mornings and is asking if matters what time of day she should take it.

## 2019-04-22 ENCOUNTER — Encounter: Payer: Self-pay | Admitting: Internal Medicine

## 2019-04-23 NOTE — Telephone Encounter (Signed)
Letter was mailed 03/26/19

## 2019-05-01 ENCOUNTER — Other Ambulatory Visit: Payer: Self-pay | Admitting: Internal Medicine

## 2019-05-02 MED ORDER — LOSARTAN POTASSIUM-HCTZ 50-12.5 MG PO TABS: 1.0000 | ORAL_TABLET | Freq: Every day | ORAL | 0 refills | Status: DC

## 2019-05-02 NOTE — Telephone Encounter (Signed)
Attestation signed by Jearld Fenton, NP at 04/09/2019 1:27 PM (Updated)  Increase Losartan HCT to 1 tab PO daily. Repeat BP nurse visit in 10 days. Should be taken in AM. Webb Silversmith, NP    Pt was not reached following BP nurse visit.... New Rx sent to pharmacy and pt needs to schedule nurse visit for BP check

## 2019-05-12 ENCOUNTER — Other Ambulatory Visit: Payer: Self-pay | Admitting: Internal Medicine

## 2019-06-08 ENCOUNTER — Other Ambulatory Visit: Payer: Self-pay | Admitting: Internal Medicine

## 2019-06-09 ENCOUNTER — Other Ambulatory Visit: Payer: Self-pay

## 2019-06-09 ENCOUNTER — Ambulatory Visit: Payer: Self-pay

## 2019-06-09 VITALS — BP 134/78 | HR 80

## 2019-06-09 DIAGNOSIS — I10 Essential (primary) hypertension: Secondary | ICD-10-CM

## 2019-06-09 NOTE — Progress Notes (Signed)
Pt came to the office to have her BP checked after starting 1 tablet from 1/2 tablet.... pt denies CP/tightness, SOB or palpitations and she states she took her medication 6am this morning   BP 134/78 and HR 80  Please advise

## 2019-06-10 NOTE — Telephone Encounter (Signed)
Patient left a voicemail stating that she was hoping to get her Losartan refilled today. Patient stated that she would like to be able to pick it up today.

## 2019-06-10 NOTE — Telephone Encounter (Signed)
Attestation signed by Lorre Munroe, NP at 06/10/2019 3:52 PM  BP improved. Continue 1 tab daily. Continue low salt diet, exercise for weight loss. Nicki Reaper, NP

## 2019-06-10 NOTE — Telephone Encounter (Signed)
Rx sent through e-scribe  

## 2019-06-20 ENCOUNTER — Telehealth: Payer: Self-pay

## 2019-06-20 NOTE — Telephone Encounter (Signed)
Patient left VM asking about Vit D medication refill, and if she should continue taking this. Per Regina's last result note, patient should have levels rechecked and then we can determine if patient needs to continue taking vitamin D.  I attempted to reach patient to notify her of this, but her VM box was not set up and I was unable to leave a message. I will try again later.

## 2019-06-20 NOTE — Telephone Encounter (Signed)
Patient called back about Vit D .  Advised of message below. Patient scheduled lab appointment for Monday 2/8

## 2019-06-23 ENCOUNTER — Telehealth: Payer: Self-pay | Admitting: *Deleted

## 2019-06-23 ENCOUNTER — Ambulatory Visit (INDEPENDENT_AMBULATORY_CARE_PROVIDER_SITE_OTHER): Payer: Self-pay | Admitting: Family Medicine

## 2019-06-23 ENCOUNTER — Encounter: Payer: Self-pay | Admitting: Family Medicine

## 2019-06-23 ENCOUNTER — Other Ambulatory Visit (INDEPENDENT_AMBULATORY_CARE_PROVIDER_SITE_OTHER): Payer: Self-pay

## 2019-06-23 ENCOUNTER — Telehealth: Payer: Self-pay

## 2019-06-23 ENCOUNTER — Other Ambulatory Visit: Payer: Self-pay

## 2019-06-23 VITALS — BP 126/68 | HR 75 | Temp 98.0°F | Ht 65.0 in | Wt 256.1 lb

## 2019-06-23 DIAGNOSIS — R32 Unspecified urinary incontinence: Secondary | ICD-10-CM | POA: Insufficient documentation

## 2019-06-23 DIAGNOSIS — N3946 Mixed incontinence: Secondary | ICD-10-CM

## 2019-06-23 DIAGNOSIS — K5900 Constipation, unspecified: Secondary | ICD-10-CM

## 2019-06-23 DIAGNOSIS — E559 Vitamin D deficiency, unspecified: Secondary | ICD-10-CM

## 2019-06-23 DIAGNOSIS — R350 Frequency of micturition: Secondary | ICD-10-CM

## 2019-06-23 LAB — POC URINALSYSI DIPSTICK (AUTOMATED)
Bilirubin, UA: NEGATIVE
Blood, UA: NEGATIVE
Glucose, UA: NEGATIVE
Ketones, UA: NEGATIVE
Leukocytes, UA: NEGATIVE
Nitrite, UA: NEGATIVE
Protein, UA: NEGATIVE
Spec Grav, UA: >=1.03 — AB (ref 1.010–1.025)
Urobilinogen, UA: 0.2 E.U./dL
pH, UA: 6 (ref 5.0–8.0)

## 2019-06-23 LAB — VITAMIN D 25 HYDROXY (VIT D DEFICIENCY, FRACTURES): VITD: 22.3 ng/mL — ABNORMAL LOW (ref 30.00–100.00)

## 2019-06-23 NOTE — Assessment & Plan Note (Signed)
This may worsen her urinary symptoms Disc strategy of inc water, fruit/veggies (fiber)  More exercise  otc miralax prn  Update if no improvement

## 2019-06-23 NOTE — Telephone Encounter (Signed)
Pt came in for an acute appt with Dr. Milinda Antis, she has Hyzaar and Metoprolol on her med list. Pt said she isn't taking metoprolol but since it was on her med list she's not sure if she's suppose to be taking it. Pt said she thought her PCP d/c med but since it's on med list she wanted me to send a message to PCP to see what BP meds she should be taking  CB # [phone]  Pt aware PCP isn't in the office and is okay waiting for a call back once she returns, I advise pt Nicki Reaper, NP's CMA will f/u with her once she responds

## 2019-06-23 NOTE — Assessment & Plan Note (Addendum)
Mixed/urge and stress in pt with obesity and h/o hysterectomy  Mild cystocele noted on exam  Disc controlling constipation with better diet/water intake and fiber  Stop soda/tea and drink water (for dysuria)  Also wt loss  We discussed medication for overactive bladder and potential side effects-she may consider  Also urology consult in future if interested  Handouts given re: kegel exercises and female urinary incontinence

## 2019-06-23 NOTE — Telephone Encounter (Signed)
[facility] Night - Client Nonclinical Telephone Record AccessNurse Client [facility] Night - Client Client Site [facility] - Night Contact Type Call Who Is Calling Patient / Member / Family / Caregiver Caller Name Angel Hobdy Caller Phone Number [phone] Patient Name Kaitlyn Bray Patient DOB 10/09/1965 Call Type Message Only Information Provided Reason for Call Request for General Office Information Initial Comment Caller stated she is running late for her 7:30 lab appt. Caller stated she is 10 minutes away. Additional Comment Caller was informed a message will be sent office phone does not turn on until 8 AM. Disp. Time Disposition Final User 06/23/2019 7:36:47 AM General Information Provided Yes Money, Rolly Salter Call Closed By: Georgina Snell Transaction Date/Time: 06/23/2019 7:34:24 AM (ET)

## 2019-06-23 NOTE — Telephone Encounter (Signed)
Vit D has already been collected today; nothing further needed for this note.

## 2019-06-23 NOTE — Patient Instructions (Addendum)
Work on weight loss Also eat more fruits/veggies for fiber  Drink lots of water (instead of tea or soda)  Aim for 64 oz of fluids per day   miralax is good thing to try over the counter for constipation  You can buy the store brand   There are some medicines for overactive bladder (for example oxybutinin, vesicare)   Let us know in the future if you want to try any of those  Also if you want to see a urologist

## 2019-06-23 NOTE — Progress Notes (Signed)
Subjective:    Patient ID: Kaitlyn Bray, female    DOB: Dec 07, 1965, 54 y.o.   MRN: [id number]  This visit occurred during the SARS-CoV-2 public health emergency.  Safety protocols were in place, including screening questions prior to the visit, additional usage of staff PPE, and extensive cleaning of exam room while observing appropriate contact time as indicated for disinfecting solutions.    HPI Pt presents with urinary symptoms  Also ? Of possible bladder prolapse   54 yo pt of NP Baity with h/o HTN  Wt Readings from Last 3 Encounters:  06/23/19 256 lb 2 oz (116.2 kg)  03/20/19 253 lb (114.8 kg)  01/21/19 250 lb (113.4 kg)   42.62 kg/m   She notices at night time when relaxed she wets herself a lot  Cannot always make it to the bathroom  Low back hurts occ  More frequency at night   Some leaking with strain  Also leaks trying to get to the bathroom   6 mo or more  Worse with some wt gain   Has had a hysterectomy  Left one ovary  Thinks her bladder has dropped   Has a hernia baseline (? Abdominal)   Her urine smells bad sometimes   Had been drinking more tea /soda  Some burning to urinate over the weekend -- this is worsened by the above  She tries to drink water   UA: fairly clear  Results for orders placed or performed in visit on 06/23/19  POCT Urinalysis Dipstick (Automated)  Result Value Ref Range   Color, UA Yellow    Clarity, UA Clear    Glucose, UA Negative Negative   Bilirubin, UA Negative    Ketones, UA Negative    Spec Grav, UA >=1.030 (A) 1.010 - 1.025   Blood, UA Negative    pH, UA 6.0 5.0 - 8.0   Protein, UA Negative Negative   Urobilinogen, UA 0.2 0.2 or 1.0 E.U./dL   Nitrite, UA Negative    Leukocytes, UA Negative Negative    High  SG - ? Dehydrated   She has had some constipation on and off  She has BM daily-has to strain   Patient Active Problem List   Diagnosis Date Noted  . Urinary incontinence 06/23/2019  . Constipation  06/23/2019  . BPPV (benign paroxysmal positional vertigo) 03/20/2019  . HTN (hypertension) 04/02/2017  . Asthma 04/02/2017   Past Medical History:  Diagnosis Date  . Asthma   . Hernia   . Hypertension    Past Surgical History:  Procedure Laterality Date  . ABDOMINAL HYSTERECTOMY    . CESAREAN SECTION    . MYOMECTOMY     Social History   Tobacco Use  . Smoking status: Never Smoker  . Smokeless tobacco: Never Used  Substance Use Topics  . Alcohol use: No  . Drug use: No   Family History  Problem Relation Age of Onset  . Hypertension Mother   . Stroke Mother   . Dementia Mother   . Heart disease Mother   . Hyperlipidemia Mother   . COPD Father   . Arthritis Father   . Mental illness Father   . Heart disease Maternal Grandmother   . Heart disease Maternal Grandfather   . Alzheimer's disease Maternal Grandfather   . Uterine cancer Paternal Grandmother    Allergies  Allergen Reactions  . Codeine     Fast heart rate  . Penicillins Rash  . Sulfa Drugs Cross Reactors  Rash   Current Outpatient Medications on File Prior to Visit  Medication Sig Dispense Refill  . albuterol (VENTOLIN HFA) 108 (90 Base) MCG/ACT inhaler Inhale 1-2 puffs into the lungs every 6 (six) hours as needed for wheezing or shortness of breath. 18 g 2  . cetirizine (ZYRTEC) 10 MG tablet Take 10 mg by mouth daily.    . fluticasone (FLONASE) 50 MCG/ACT nasal spray Place into both nostrils daily.    Marland Kitchen ibuprofen (ADVIL,MOTRIN) 200 MG tablet Take 200 mg by mouth every 6 (six) hours as needed.    Marland Kitchen losartan-hydrochlorothiazide (HYZAAR) 50-12.5 MG tablet Take 1 tablet by mouth daily. 90 tablet 1  . meclizine (ANTIVERT) 25 MG tablet Take 1 tablet (25 mg total) by mouth 3 (three) times daily as needed for dizziness or nausea. 30 tablet 1  . Vitamin D, Ergocalciferol, (DRISDOL) 1.25 MG (50000 UT) CAPS capsule Take 1 capsule (50,000 Units total) by mouth every 7 (seven) days. 12 capsule 0  . metoprolol tartrate  (LOPRESSOR) 50 MG tablet TAKE 1 TABLET BY MOUTH TWICE A DAY (Patient not taking: Reported on 06/23/2019) 180 tablet 0   No current facility-administered medications on file prior to visit.    Review of Systems  Constitutional: Negative for activity change, appetite change, fatigue, fever and unexpected weight change.  HENT: Negative for congestion, ear pain, rhinorrhea, sinus pressure and sore throat.   Eyes: Negative for pain, redness and visual disturbance.  Respiratory: Negative for cough, shortness of breath and wheezing.   Cardiovascular: Negative for chest pain and palpitations.  Gastrointestinal: Negative for abdominal pain, blood in stool, constipation and diarrhea.  Endocrine: Negative for polydipsia and polyuria.  Genitourinary: Positive for dysuria and frequency. Negative for difficulty urinating, flank pain, hematuria, pelvic pain, urgency and vaginal pain.  Musculoskeletal: Negative for arthralgias, back pain and myalgias.  Skin: Negative for pallor and rash.  Allergic/Immunologic: Negative for environmental allergies.  Neurological: Negative for dizziness, syncope and headaches.  Hematological: Negative for adenopathy. Does not bruise/bleed easily.  Psychiatric/Behavioral: Negative for decreased concentration and dysphoric mood. The patient is not nervous/anxious.        Objective:   Physical Exam Exam conducted with a chaperone present.  Constitutional:      General: She is not in acute distress.    Appearance: Normal appearance. She is obese. She is not ill-appearing.  HENT:     Mouth/Throat:     Mouth: Mucous membranes are moist.  Eyes:     General: No scleral icterus.    Conjunctiva/sclera: Conjunctivae normal.     Pupils: Pupils are equal, round, and reactive to light.  Cardiovascular:     Rate and Rhythm: Normal rate and regular rhythm.  Abdominal:     General: Abdomen is flat. Bowel sounds are normal. There is no distension.     Palpations: Abdomen is soft.  There is no mass.     Tenderness: There is no abdominal tenderness. There is no right CVA tenderness, left CVA tenderness, guarding or rebound.     Comments: No suprapubic tenderness or fullness    Genitourinary:    General: Normal vulva.     Vagina: No erythema, tenderness or lesions.     Comments: Mild cystocele noted  Does not fully prolapse on bearing down  Musculoskeletal:     Cervical back: Normal range of motion and neck supple.  Skin:    General: Skin is warm and dry.     Coloration: Skin is not pale.  Findings: No erythema or rash.  Neurological:     Mental Status: She is alert.     Coordination: Coordination normal.  Psychiatric:        Mood and Affect: Mood normal.           Assessment & Plan:   Problem List Items Addressed This Visit      Other   Urinary incontinence - Primary    Mixed/urge and stress in pt with obesity and h/o hysterectomy  Mild cystocele noted on exam  Disc controlling constipation with better diet/water intake and fiber  Stop soda/tea and drink water (for dysuria)  Also wt loss  We discussed medication for overactive bladder and potential side effects-she may consider  Also urology consult in future if interested  Handouts given re: kegel exercises and female urinary incontinence       Constipation    This may worsen her urinary symptoms Disc strategy of inc water, fruit/veggies (fiber)  More exercise  otc miralax prn  Update if no improvement       Other Visit Diagnoses    Urinary frequency       Relevant Orders   POCT Urinalysis Dipstick (Automated) (Completed)

## 2019-06-24 NOTE — Telephone Encounter (Signed)
Metoprolol was d/c'd, per my last note

## 2019-07-02 NOTE — Telephone Encounter (Signed)
Patient returned call from office,. Advised that a message was left on her v/m, She stated she will check

## 2019-07-02 NOTE — Telephone Encounter (Signed)
Left detailed msg on VM per HIPAA  

## 2019-07-02 NOTE — Addendum Note (Signed)
Addended by: Roena Malady on: 07/02/2019 09:59 AM   Modules accepted: Orders

## 2019-07-09 MED ORDER — VITAMIN D (ERGOCALCIFEROL) 1.25 MG (50000 UNIT) PO CAPS: 50000.0000 [IU] | ORAL_CAPSULE | ORAL | 0 refills | Status: DC

## 2019-07-09 NOTE — Addendum Note (Signed)
Addended by: Roena Malady on: 07/09/2019 11:27 AM   Modules accepted: Orders

## 2019-10-03 ENCOUNTER — Other Ambulatory Visit: Payer: Self-pay | Admitting: Internal Medicine

## 2019-10-03 DIAGNOSIS — E559 Vitamin D deficiency, unspecified: Secondary | ICD-10-CM

## 2019-10-06 ENCOUNTER — Other Ambulatory Visit: Payer: Self-pay

## 2019-10-06 ENCOUNTER — Other Ambulatory Visit (INDEPENDENT_AMBULATORY_CARE_PROVIDER_SITE_OTHER): Payer: Self-pay

## 2019-10-06 DIAGNOSIS — E559 Vitamin D deficiency, unspecified: Secondary | ICD-10-CM

## 2019-10-06 LAB — VITAMIN D 25 HYDROXY (VIT D DEFICIENCY, FRACTURES): VITD: 27.77 ng/mL — ABNORMAL LOW (ref 30.00–100.00)

## 2019-10-14 ENCOUNTER — Telehealth: Payer: Self-pay

## 2019-10-14 NOTE — Telephone Encounter (Signed)
Left detailed msg on VM per HIPAA w/ lab results and let pt know that a letter was mailed last week  Also I have not come across any PPW and nothing in chart about PPW... so I am not sure what she is requesting as far as PPW

## 2019-10-14 NOTE — Telephone Encounter (Signed)
Pt left v/m requesting cb about recent lab results for Vit D and pt will need refills on Vit D medication. Also pt request cb ASAP about status of paperwork being signed so pt can come by [facility] and pick up paperwork and return to employer by 10/17/19.

## 2019-10-15 NOTE — Telephone Encounter (Signed)
Spoke with [employer]. Paperwork has been completed./ patient is aware and will come by to pick this up   Paperwork is up front for pick up

## 2019-12-01 ENCOUNTER — Other Ambulatory Visit: Payer: Self-pay | Admitting: Internal Medicine

## 2019-12-23 ENCOUNTER — Telehealth: Payer: Self-pay

## 2019-12-23 NOTE — Telephone Encounter (Signed)
Pt requesting cost of TB skin test. Front office supervisor reports test should be around $19.

## 2020-02-22 ENCOUNTER — Other Ambulatory Visit: Payer: Self-pay | Admitting: Internal Medicine

## 2020-04-29 ENCOUNTER — Telehealth: Payer: Self-pay

## 2020-04-29 NOTE — Telephone Encounter (Signed)
Pt left v/m for 2 wks has had sinus pressure and now has earache; pt request z pak. Left v/m for pt to call [facility] triage. Tried pt again still unable to reach by phone.

## 2020-04-30 NOTE — Telephone Encounter (Signed)
I called and home # is answered on v/m with different name and the cell # get recording has been disconnected; and work # is different name saying to leave message.sending note to [facility] CMA.

## 2020-05-24 ENCOUNTER — Ambulatory Visit
Admission: EM | Admit: 2020-05-24 | Discharge: 2020-05-24 | Disposition: A | Discharge status: Home / Self Care | Payer: Self-pay | Attending: Emergency Medicine | Admitting: Emergency Medicine

## 2020-05-24 ENCOUNTER — Encounter: Payer: Self-pay | Admitting: Emergency Medicine

## 2020-05-24 ENCOUNTER — Telehealth: Payer: Self-pay

## 2020-05-24 DIAGNOSIS — J069 Acute upper respiratory infection, unspecified: Secondary | ICD-10-CM

## 2020-05-24 DIAGNOSIS — Z1152 Encounter for screening for COVID-19: Secondary | ICD-10-CM

## 2020-05-24 MED ORDER — ALBUTEROL SULFATE HFA 108 (90 BASE) MCG/ACT IN AERS: 1.0000 | INHALATION_SPRAY | Freq: Four times a day (QID) | RESPIRATORY_TRACT | 1 refills | Status: DC | PRN

## 2020-05-24 MED ORDER — BENZONATATE 100 MG PO CAPS
100.0000 mg | ORAL_CAPSULE | Freq: Three times a day (TID) | ORAL | 0 refills | Status: DC | PRN
Start: 2020-05-24 — End: 2020-08-26

## 2020-05-24 MED ORDER — PREDNISONE 10 MG PO TABS
20.0000 mg | ORAL_TABLET | Freq: Every day | ORAL | 0 refills | Status: DC
Start: 2020-05-24 — End: 2020-08-05

## 2020-05-24 NOTE — Discharge Instructions (Addendum)
COVID testing ordered.  It will take between 2-7 days for test results.  Someone will contact you regarding abnormal results.    Get plenty of rest and push fluids Tessalon Perles prescribed for cough Prednisone was prescribed/take as directed Albuterol was prescribed Continue flonase for nasal congestion and runny nose Use medications daily for symptom relief Use OTC medications like ibuprofen or tylenol as needed fever or pain Call or go to the ED if you have any new or worsening symptoms such as fever, worsening cough, shortness of breath, chest tightness, chest pain, turning blue, changes in mental status, etc..Marland Kitchen

## 2020-05-24 NOTE — ED Notes (Signed)
States cough is productive

## 2020-05-24 NOTE — ED Provider Notes (Signed)
[facility]   [id number] 05/24/20 Arrival Time: 1110   CC: COVID symptoms  SUBJECTIVE: History from: patient.  Kaitlyn Bray is a 55 y.o. female who presented to the urgent care with a complaint of cough, nasal congestion and sinus pressure for the past 2 to 3 days.  Denies sick exposure to COVID, flu or strep.  Denies recent travel.  Has tried OTC medication without relief.  Denies alleviating or aggravating factors.  Denies previous symptoms in the past.   Denies fever, chills, fatigue, sinus pain, rhinorrhea, sore throat, SOB, wheezing, chest pain, nausea, changes in bowel or bladder habits.     ROS: As per HPI.  All other pertinent ROS negative.     Past Medical History:  Diagnosis Date  . Asthma   . Hernia   . Hypertension    Past Surgical History:  Procedure Laterality Date  . ABDOMINAL HYSTERECTOMY    . CESAREAN SECTION    . MYOMECTOMY     Allergies  Allergen Reactions  . Codeine     Fast heart rate  . Penicillins Rash  . Sulfa Drugs Cross Reactors Rash   No current facility-administered medications on file prior to encounter.   Current Outpatient Medications on File Prior to Encounter  Medication Sig Dispense Refill  . cetirizine (ZYRTEC) 10 MG tablet Take 10 mg by mouth daily.    . fluticasone (FLONASE) 50 MCG/ACT nasal spray Place into both nostrils daily.    Marland Kitchen ibuprofen (ADVIL,MOTRIN) 200 MG tablet Take 200 mg by mouth every 6 (six) hours as needed.    Marland Kitchen losartan-hydrochlorothiazide (HYZAAR) 50-12.5 MG tablet Take 1 tablet by mouth daily. MUST SCHEDULE PHYSICAL 90 tablet 0  . meclizine (ANTIVERT) 25 MG tablet Take 1 tablet (25 mg total) by mouth 3 (three) times daily as needed for dizziness or nausea. 30 tablet 1  . Vitamin D, Ergocalciferol, (DRISDOL) 1.25 MG (50000 UNIT) CAPS capsule Take 1 capsule (50,000 Units total) by mouth every 7 (seven) days. 12 capsule 0   Social History   Socioeconomic History  . Marital status: Married    Spouse  name: Not on file  . Number of children: Not on file  . Years of education: Not on file  . Highest education level: Not on file  Occupational History  . Not on file  Tobacco Use  . Smoking status: Never Smoker  . Smokeless tobacco: Never Used  Vaping Use  . Vaping Use: Never used  Substance and Sexual Activity  . Alcohol use: No  . Drug use: No  . Sexual activity: Not on file  Other Topics Concern  . Not on file  Social History Narrative  . Not on file   Social Determinants of Health   Financial Resource Strain: Not on file  Food Insecurity: Not on file  Transportation Needs: Not on file  Physical Activity: Not on file  Stress: Not on file  Social Connections: Not on file  Intimate Partner Violence: Not on file   Family History  Problem Relation Age of Onset  . Hypertension Mother   . Stroke Mother   . Dementia Mother   . Heart disease Mother   . Hyperlipidemia Mother   . COPD Father   . Arthritis Father   . Mental illness Father   . Heart disease Maternal Grandmother   . Heart disease Maternal Grandfather   . Alzheimer's disease Maternal Grandfather   . Uterine cancer Paternal Grandmother     OBJECTIVE:  Vitals:  05/24/20 1258 05/24/20 1259  BP: (!) 161/92   Pulse: 83   Resp: 18   Temp: 97.9 F (36.6 C)   TempSrc: Oral   SpO2: 96%   Weight:  260 lb (117.9 kg)  Height:  5\' 5"  (1.651 m)     General appearance: alert; appears fatigued, but nontoxic; speaking in full sentences and tolerating own secretions HEENT: NCAT; Ears: EACs clear, TMs pearly gray; Eyes: PERRL.  EOM grossly intact. Sinuses: nontender; Nose: nares patent without rhinorrhea, Throat: oropharynx clear, tonsils non erythematous or enlarged, uvula midline  Neck: supple without LAD Lungs: unlabored respirations, symmetrical air entry; cough: moderate; no respiratory distress; CTAB Heart: regular rate and rhythm.  Radial pulses 2+ symmetrical bilaterally Skin: warm and dry Psychological:  alert and cooperative; normal mood and affect  LABS:  No results found for this or any previous visit (from the past 24 hour(s)).   ASSESSMENT & PLAN:  1. Encounter for screening for COVID-19   2. URI with cough and congestion     Meds ordered this encounter  Medications  . predniSONE (DELTASONE) 10 MG tablet    Sig: Take 2 tablets (20 mg total) by mouth daily.    Dispense:  15 tablet    Refill:  0  . albuterol (VENTOLIN HFA) 108 (90 Base) MCG/ACT inhaler    Sig: Inhale 1-2 puffs into the lungs every 6 (six) hours as needed for wheezing or shortness of breath.    Dispense:  18 g    Refill:  1  . benzonatate (TESSALON) 100 MG capsule    Sig: Take 1 capsule (100 mg total) by mouth 3 (three) times daily as needed for cough.    Dispense:  30 capsule    Refill:  0    Discharge Instructions  COVID testing ordered.  It will take between 2-7 days for test results.  Someone will contact you regarding abnormal results.    Get plenty of rest and push fluids Tessalon Perles prescribed for cough Prednisone was prescribed/take as directed Albuterol was prescribed Continue flonase for nasal congestion and runny nose Use medications daily for symptom relief Use OTC medications like ibuprofen or tylenol as needed fever or pain Call or go to the ED if you have any new or worsening symptoms such as fever, worsening cough, shortness of breath, chest tightness, chest pain, turning blue, changes in mental status, etc...   Reviewed expectations re: course of current medical issues. Questions answered. Outlined signs and symptoms indicating need for more acute intervention. Patient verbalized understanding. After Visit Summary given.         , FNP 05/24/20 1357

## 2020-05-24 NOTE — ED Triage Notes (Signed)
Cough since yesterday, sinus pressure since Saturday.

## 2020-05-24 NOTE — Telephone Encounter (Signed)
[facility] Night - Client TELEPHONE ADVICE RECORD Access[facility]urse Patient Name: Kaitlyn Bray Gender: Female DOB: April 07, 1966 Age: 55 Y 6 M 9 D Return Phone Number: [phone] (Primary) [place] [address] Client [facility] Night - Client Client Site [facility] - Night Physician Nicki Reaper - NP Contact Type Call Who Is Calling Patient / Member / Family / Caregiver Call Type Triage / Clinical Relationship To Patient Self Return Phone Number [phone] (Primary) Chief Complaint Headache Reason for Call Symptomatic / Request for Health Information Initial Comment Caller states, pt has headache, sinus issues and coughing. Wanting to see if she has covid 19. Declined rn or message Translation No Nurse Assessment Nurse: Lorenz Coaster, RN, Gregary Signs Date/Time (Eastern Time): 05/24/2020 8:51:22 AM Confirm and document reason for call. If symptomatic, describe symptoms. ---Caller states had sinus pressure and headache Saturday, then Sunday had a dry hackey cough, now chest congestion, history of Bronchitis, denies fever, Advil and Zarbees cough for symptoms. Does the patient have any new or worsening symptoms? ---Yes Will a triage be completed? ---Yes Related visit to physician within the last 2 weeks? ---No Does the PT have any chronic conditions? (i.e. diabetes, asthma, this includes High risk factors for pregnancy, etc.) ---Yes List chronic conditions. ---HTN, Asthma Is the patient pregnant or possibly pregnant? (Ask all females between the ages of 24-55) ---No Is this a behavioral health or substance abuse call? ---No Guidelines Guideline Title Affirmed Question Affirmed Notes Nurse Date/Time (Eastern Time) COVID-19 - Diagnosed or Suspected Chest pain or pressure Baxter, RN, Gregary Signs 05/24/2020 8:55:12 AM Disp. Time Lamount Cohen Time) Disposition Final User 05/24/2020 8:58:05 AM Go to ED Now (or PCP  triage) Yes Baxter, RN, Marcelo Baldy Disagree/Comply Comply PLEASE NOTE: All timestamps contained within this report are represented as [place] Standard Time. CONFIDENTIALTY NOTICE: This fax transmission is intended only for the addressee. It contains information that is legally privileged, confidential or otherwise protected from use or disclosure. If you are not the intended recipient, you are strictly prohibited from reviewing, disclosing, copying using or disseminating any of this information or taking any action in reliance on or regarding this information. If you have received this fax in error, please notify us immediately by telephone so that we can arrange for its return to [place]. Phone: [phone], Toll-Free: [phone], Fax: [phone] Page: 2 of 2 Call Id: [id number] Caller Understands Yes PreDisposition InappropriateToAsk Care Advice Given Per Guideline GO TO ED NOW (OR PCP TRIAGE): CARE ADVICE given per COVID-19 - DIAGNOSED OR SUSPECTED (Adult) guideline. Comments User: Lorraine Lax, RN Date/Time Lamount Cohen Time): 05/24/2020 8:58:16 AM ED recommended

## 2020-05-25 NOTE — Telephone Encounter (Signed)
Urgent care note reviewed

## 2020-05-27 LAB — COVID-19, FLU A+B NAA
Influenza A, NAA: NOT DETECTED
Influenza B, NAA: NOT DETECTED
SARS-CoV-2, NAA: DETECTED — AB

## 2020-06-13 ENCOUNTER — Other Ambulatory Visit: Payer: Self-pay | Admitting: Internal Medicine

## 2020-07-08 ENCOUNTER — Other Ambulatory Visit: Payer: Self-pay | Admitting: Internal Medicine

## 2020-07-29 ENCOUNTER — Inpatient Hospital Stay (HOSPITAL_COMMUNITY): Payer: Self-pay

## 2020-07-29 ENCOUNTER — Encounter (HOSPITAL_COMMUNITY): Admission: EM | Disposition: A | Discharge status: Home / Self Care | Payer: Self-pay | Source: Home / Self Care

## 2020-07-29 ENCOUNTER — Inpatient Hospital Stay (HOSPITAL_COMMUNITY): Payer: Self-pay | Admitting: Anesthesiology

## 2020-07-29 ENCOUNTER — Emergency Department (HOSPITAL_COMMUNITY): Payer: Self-pay

## 2020-07-29 ENCOUNTER — Inpatient Hospital Stay (HOSPITAL_COMMUNITY)
Admission: EM | Admit: 2020-07-29 | Discharge: 2020-08-05 | DRG: 354 | Disposition: A | Discharge status: Home / Self Care | Payer: Self-pay | Attending: General Surgery | Admitting: General Surgery

## 2020-07-29 ENCOUNTER — Encounter (HOSPITAL_COMMUNITY): Payer: Self-pay

## 2020-07-29 DIAGNOSIS — Z880 Allergy status to penicillin: Secondary | ICD-10-CM

## 2020-07-29 DIAGNOSIS — K430 Incisional hernia with obstruction, without gangrene: Principal | ICD-10-CM | POA: Diagnosis present

## 2020-07-29 DIAGNOSIS — K567 Ileus, unspecified: Secondary | ICD-10-CM | POA: Diagnosis not present

## 2020-07-29 DIAGNOSIS — Z885 Allergy status to narcotic agent status: Secondary | ICD-10-CM

## 2020-07-29 DIAGNOSIS — Z98891 History of uterine scar from previous surgery: Secondary | ICD-10-CM

## 2020-07-29 DIAGNOSIS — Z9079 Acquired absence of other genital organ(s): Secondary | ICD-10-CM

## 2020-07-29 DIAGNOSIS — J4520 Mild intermittent asthma, uncomplicated: Secondary | ICD-10-CM | POA: Diagnosis present

## 2020-07-29 DIAGNOSIS — Z09 Encounter for follow-up examination after completed treatment for conditions other than malignant neoplasm: Secondary | ICD-10-CM

## 2020-07-29 DIAGNOSIS — R011 Cardiac murmur, unspecified: Secondary | ICD-10-CM | POA: Diagnosis present

## 2020-07-29 DIAGNOSIS — Z8249 Family history of ischemic heart disease and other diseases of the circulatory system: Secondary | ICD-10-CM

## 2020-07-29 DIAGNOSIS — I10 Essential (primary) hypertension: Secondary | ICD-10-CM | POA: Diagnosis present

## 2020-07-29 DIAGNOSIS — E8881 Metabolic syndrome: Secondary | ICD-10-CM | POA: Diagnosis present

## 2020-07-29 DIAGNOSIS — Z20822 Contact with and (suspected) exposure to covid-19: Secondary | ICD-10-CM | POA: Diagnosis present

## 2020-07-29 DIAGNOSIS — K440 Diaphragmatic hernia with obstruction, without gangrene: Secondary | ICD-10-CM | POA: Diagnosis present

## 2020-07-29 DIAGNOSIS — Z90710 Acquired absence of both cervix and uterus: Secondary | ICD-10-CM

## 2020-07-29 DIAGNOSIS — Z825 Family history of asthma and other chronic lower respiratory diseases: Secondary | ICD-10-CM

## 2020-07-29 DIAGNOSIS — Z79899 Other long term (current) drug therapy: Secondary | ICD-10-CM

## 2020-07-29 DIAGNOSIS — Z8616 Personal history of COVID-19: Secondary | ICD-10-CM

## 2020-07-29 DIAGNOSIS — Z90722 Acquired absence of ovaries, bilateral: Secondary | ICD-10-CM

## 2020-07-29 DIAGNOSIS — K56609 Unspecified intestinal obstruction, unspecified as to partial versus complete obstruction: Secondary | ICD-10-CM

## 2020-07-29 DIAGNOSIS — I358 Other nonrheumatic aortic valve disorders: Secondary | ICD-10-CM | POA: Diagnosis present

## 2020-07-29 DIAGNOSIS — D509 Iron deficiency anemia, unspecified: Secondary | ICD-10-CM | POA: Diagnosis present

## 2020-07-29 DIAGNOSIS — Z9049 Acquired absence of other specified parts of digestive tract: Secondary | ICD-10-CM

## 2020-07-29 DIAGNOSIS — Z882 Allergy status to sulfonamides status: Secondary | ICD-10-CM

## 2020-07-29 DIAGNOSIS — Z6841 Body Mass Index (BMI) 40.0 and over, adult: Secondary | ICD-10-CM

## 2020-07-29 HISTORY — PX: LAPAROTOMY: SHX154

## 2020-07-29 LAB — CBC
HCT: 45.8 % (ref 36.0–46.0)
Hemoglobin: 14.5 g/dL (ref 12.0–15.0)
MCH: 27.8 pg (ref 26.0–34.0)
MCHC: 31.7 g/dL (ref 30.0–36.0)
MCV: 87.9 fL (ref 80.0–100.0)
Platelets: 260 10*3/uL (ref 150–400)
RBC: 5.21 MIL/uL — ABNORMAL HIGH (ref 3.87–5.11)
RDW: 13.4 % (ref 11.5–15.5)
WBC: 11.5 10*3/uL — ABNORMAL HIGH (ref 4.0–10.5)
nRBC: 0 % (ref 0.0–0.2)

## 2020-07-29 LAB — LIPASE, BLOOD: Lipase: 27 U/L (ref 11–51)

## 2020-07-29 LAB — COMPREHENSIVE METABOLIC PANEL
ALT: 19 U/L (ref 0–44)
AST: 18 U/L (ref 15–41)
Albumin: 3.9 g/dL (ref 3.5–5.0)
Alkaline Phosphatase: 73 U/L (ref 38–126)
Anion gap: 9 (ref 5–15)
BUN: 11 mg/dL (ref 6–20)
CO2: 32 mmol/L (ref 22–32)
Calcium: 9.6 mg/dL (ref 8.9–10.3)
Chloride: 97 mmol/L — ABNORMAL LOW (ref 98–111)
Creatinine, Ser: 0.76 mg/dL (ref 0.44–1.00)
GFR, Estimated: >60 mL/min (ref >60)
Glucose, Bld: 131 mg/dL — ABNORMAL HIGH (ref 70–99)
Potassium: 4.1 mmol/L (ref 3.5–5.1)
Sodium: 138 mmol/L (ref 135–145)
Total Bilirubin: 0.8 mg/dL (ref 0.3–1.2)
Total Protein: 7.4 g/dL (ref 6.5–8.1)

## 2020-07-29 LAB — URINALYSIS, ROUTINE W REFLEX MICROSCOPIC
Bilirubin Urine: NEGATIVE
Glucose, UA: NEGATIVE mg/dL
Hgb urine dipstick: NEGATIVE
Ketones, ur: NEGATIVE mg/dL
Leukocytes,Ua: NEGATIVE
Nitrite: NEGATIVE
Protein, ur: NEGATIVE mg/dL
Specific Gravity, Urine: 1.015 (ref 1.005–1.030)
pH: 6 (ref 5.0–8.0)

## 2020-07-29 LAB — TYPE AND SCREEN
ABO/RH(D): A POS
Antibody Screen: NEGATIVE

## 2020-07-29 LAB — BASIC METABOLIC PANEL
Anion gap: 8 (ref 5–15)
BUN: 14 mg/dL (ref 6–20)
CO2: 28 mmol/L (ref 22–32)
Calcium: 8.4 mg/dL — ABNORMAL LOW (ref 8.9–10.3)
Chloride: 100 mmol/L (ref 98–111)
Creatinine, Ser: 0.86 mg/dL (ref 0.44–1.00)
GFR, Estimated: >60 mL/min (ref >60)
Glucose, Bld: 164 mg/dL — ABNORMAL HIGH (ref 70–99)
Potassium: 4.1 mmol/L (ref 3.5–5.1)
Sodium: 136 mmol/L (ref 135–145)

## 2020-07-29 LAB — ABO/RH: ABO/RH(D): A POS

## 2020-07-29 LAB — SARS CORONAVIRUS 2 (TAT 6-24 HRS): SARS Coronavirus 2: NEGATIVE

## 2020-07-29 LAB — LACTIC ACID, PLASMA: Lactic Acid, Venous: 1.2 mmol/L (ref 0.5–1.9)

## 2020-07-29 SURGERY — LAPAROTOMY, EXPLORATORY
Anesthesia: General

## 2020-07-29 MED ORDER — METOPROLOL TARTRATE 5 MG/5ML IV SOLN
2.5000 mg | Freq: Two times a day (BID) | INTRAVENOUS | Status: DC
  Administered 2020-07-29 – 2020-08-05 (×14): 2.5 mg via INTRAVENOUS
  Filled 2020-07-29 (×14): qty 5

## 2020-07-29 MED ORDER — MORPHINE SULFATE (PF) 2 MG/ML IV SOLN
2.0000 mg | INTRAVENOUS | Status: DC | PRN
  Administered 2020-08-01: 2 mg via INTRAVENOUS
  Filled 2020-07-29: qty 1

## 2020-07-29 MED ORDER — 0.9 % SODIUM CHLORIDE (POUR BTL) OPTIME
TOPICAL | Status: DC | PRN
  Administered 2020-07-29: 2000 mL

## 2020-07-29 MED ORDER — HYDROMORPHONE HCL 1 MG/ML IJ SOLN
INTRAMUSCULAR | Status: DC | PRN
  Administered 2020-07-29: .5 mg via INTRAVENOUS

## 2020-07-29 MED ORDER — PROPOFOL 10 MG/ML IV BOLUS
INTRAVENOUS | Status: DC | PRN
  Administered 2020-07-29: 140 mg via INTRAVENOUS

## 2020-07-29 MED ORDER — MECLIZINE HCL 25 MG PO TABS: 25.0000 mg | ORAL_TABLET | Freq: Three times a day (TID) | ORAL | Status: DC | PRN

## 2020-07-29 MED ORDER — HYDROMORPHONE HCL 1 MG/ML IJ SOLN
1.0000 mg | INTRAMUSCULAR | Status: DC | PRN
  Administered 2020-07-30 – 2020-08-02 (×11): 1 mg via INTRAVENOUS
  Filled 2020-07-29 (×11): qty 1

## 2020-07-29 MED ORDER — HYDROMORPHONE HCL 1 MG/ML IJ SOLN
INTRAMUSCULAR | Status: AC
  Filled 2020-07-29: qty 1

## 2020-07-29 MED ORDER — ONDANSETRON HCL 4 MG/2ML IJ SOLN
4.0000 mg | Freq: Once | INTRAMUSCULAR | Status: AC
  Administered 2020-07-29: 4 mg via INTRAVENOUS
  Filled 2020-07-29: qty 2

## 2020-07-29 MED ORDER — ONDANSETRON HCL 4 MG/2ML IJ SOLN
INTRAMUSCULAR | Status: DC | PRN
  Administered 2020-07-29: 4 mg via INTRAVENOUS

## 2020-07-29 MED ORDER — PROPOFOL 10 MG/ML IV BOLUS
INTRAVENOUS | Status: AC
  Filled 2020-07-29: qty 20

## 2020-07-29 MED ORDER — ALBUTEROL SULFATE HFA 108 (90 BASE) MCG/ACT IN AERS
1.0000 | INHALATION_SPRAY | Freq: Four times a day (QID) | RESPIRATORY_TRACT | Status: DC | PRN
  Filled 2020-07-29: qty 6.7

## 2020-07-29 MED ORDER — SODIUM CHLORIDE 0.9 % IV SOLN
INTRAVENOUS | Status: AC
  Filled 2020-07-29: qty 2

## 2020-07-29 MED ORDER — DEXAMETHASONE SODIUM PHOSPHATE 10 MG/ML IJ SOLN
INTRAMUSCULAR | Status: DC | PRN
  Administered 2020-07-29: 10 mg via INTRAVENOUS

## 2020-07-29 MED ORDER — ACETAMINOPHEN 10 MG/ML IV SOLN
INTRAVENOUS | Status: AC
  Filled 2020-07-29: qty 100

## 2020-07-29 MED ORDER — CHLORHEXIDINE GLUCONATE 0.12 % MT SOLN
15.0000 mL | Freq: Once | OROMUCOSAL | Status: AC
  Administered 2020-07-29: 15 mL via OROMUCOSAL

## 2020-07-29 MED ORDER — SODIUM CHLORIDE 0.9 % IV SOLN
2.0000 g | INTRAVENOUS | Status: AC
  Administered 2020-07-29: 2 g via INTRAVENOUS
  Filled 2020-07-29 (×2): qty 2

## 2020-07-29 MED ORDER — HYDROMORPHONE HCL 1 MG/ML IJ SOLN
0.2500 mg | INTRAMUSCULAR | Status: DC | PRN
  Administered 2020-07-29 (×3): 0.5 mg via INTRAVENOUS

## 2020-07-29 MED ORDER — FENTANYL CITRATE (PF) 250 MCG/5ML IJ SOLN
INTRAMUSCULAR | Status: AC
  Filled 2020-07-29: qty 5

## 2020-07-29 MED ORDER — LIDOCAINE 2% (20 MG/ML) 5 ML SYRINGE
INTRAMUSCULAR | Status: DC | PRN
  Administered 2020-07-29: 60 mg via INTRAVENOUS

## 2020-07-29 MED ORDER — ACETAMINOPHEN 10 MG/ML IV SOLN
1000.0000 mg | Freq: Once | INTRAVENOUS | Status: AC
  Administered 2020-07-29: 1000 mg via INTRAVENOUS

## 2020-07-29 MED ORDER — SUGAMMADEX SODIUM 200 MG/2ML IV SOLN
INTRAVENOUS | Status: DC | PRN
  Administered 2020-07-29: 200 mg via INTRAVENOUS

## 2020-07-29 MED ORDER — MIDAZOLAM HCL 2 MG/2ML IJ SOLN
INTRAMUSCULAR | Status: AC
  Filled 2020-07-29: qty 2

## 2020-07-29 MED ORDER — GABAPENTIN 300 MG PO CAPS
300.0000 mg | ORAL_CAPSULE | ORAL | Status: DC
  Filled 2020-07-29: qty 1

## 2020-07-29 MED ORDER — LACTATED RINGERS IV SOLN: INTRAVENOUS | Status: DC

## 2020-07-29 MED ORDER — MIDAZOLAM HCL 5 MG/5ML IJ SOLN
INTRAMUSCULAR | Status: DC | PRN
  Administered 2020-07-29: 2 mg via INTRAVENOUS

## 2020-07-29 MED ORDER — ONDANSETRON HCL 4 MG/2ML IJ SOLN
4.0000 mg | Freq: Four times a day (QID) | INTRAMUSCULAR | Status: DC | PRN
  Administered 2020-07-29 – 2020-08-03 (×5): 4 mg via INTRAVENOUS
  Filled 2020-07-29 (×6): qty 2

## 2020-07-29 MED ORDER — METHOCARBAMOL 1000 MG/10ML IJ SOLN
500.0000 mg | Freq: Three times a day (TID) | INTRAVENOUS | Status: DC
  Administered 2020-07-29 – 2020-08-02 (×9): 500 mg via INTRAVENOUS
  Filled 2020-07-29: qty 5
  Filled 2020-07-29: qty 500
  Filled 2020-07-29 (×2): qty 5
  Filled 2020-07-29: qty 500
  Filled 2020-07-29 (×4): qty 5

## 2020-07-29 MED ORDER — ONDANSETRON 4 MG PO TBDP
4.0000 mg | ORAL_TABLET | Freq: Four times a day (QID) | ORAL | Status: DC | PRN
  Administered 2020-08-04 – 2020-08-05 (×2): 4 mg via ORAL
  Filled 2020-07-29 (×2): qty 1

## 2020-07-29 MED ORDER — IOHEXOL 300 MG/ML  SOLN
100.0000 mL | Freq: Once | INTRAMUSCULAR | Status: AC | PRN
  Administered 2020-07-29: 100 mL via INTRAVENOUS

## 2020-07-29 MED ORDER — KETOROLAC TROMETHAMINE 30 MG/ML IJ SOLN
15.0000 mg | Freq: Once | INTRAMUSCULAR | Status: AC
  Administered 2020-07-29: 15 mg via INTRAVENOUS
  Filled 2020-07-29: qty 1

## 2020-07-29 MED ORDER — SUCCINYLCHOLINE CHLORIDE 200 MG/10ML IV SOSY
PREFILLED_SYRINGE | INTRAVENOUS | Status: DC | PRN
  Administered 2020-07-29: 120 mg via INTRAVENOUS

## 2020-07-29 MED ORDER — HYDROMORPHONE HCL 1 MG/ML IJ SOLN
INTRAMUSCULAR | Status: AC
  Filled 2020-07-29: qty 0.5

## 2020-07-29 MED ORDER — FENTANYL CITRATE (PF) 100 MCG/2ML IJ SOLN
INTRAMUSCULAR | Status: DC | PRN
  Administered 2020-07-29: 50 ug via INTRAVENOUS
  Administered 2020-07-29 (×2): 100 ug via INTRAVENOUS

## 2020-07-29 MED ORDER — KCL IN DEXTROSE-NACL 20-5-0.9 MEQ/L-%-% IV SOLN
INTRAVENOUS | Status: DC
  Filled 2020-07-29 (×10): qty 1000

## 2020-07-29 MED ORDER — STERILE WATER FOR IRRIGATION IR SOLN
Status: DC | PRN
  Administered 2020-07-29: 1000 mL

## 2020-07-29 MED ORDER — ORAL CARE MOUTH RINSE: 15.0000 mL | Freq: Once | OROMUCOSAL | Status: AC

## 2020-07-29 MED ORDER — LOSARTAN POTASSIUM-HCTZ 50-12.5 MG PO TABS: 1.0000 | ORAL_TABLET | Freq: Every day | ORAL | Status: DC

## 2020-07-29 MED ORDER — ROCURONIUM BROMIDE 10 MG/ML (PF) SYRINGE
PREFILLED_SYRINGE | INTRAVENOUS | Status: DC | PRN
  Administered 2020-07-29: 60 mg via INTRAVENOUS

## 2020-07-29 MED ORDER — SCOPOLAMINE 1 MG/3DAYS TD PT72
1.0000 | MEDICATED_PATCH | TRANSDERMAL | Status: DC
  Filled 2020-07-29 (×2): qty 1

## 2020-07-29 MED ORDER — ACETAMINOPHEN 500 MG PO TABS
1000.0000 mg | ORAL_TABLET | ORAL | Status: DC
  Filled 2020-07-29: qty 2

## 2020-07-29 MED ORDER — FENTANYL CITRATE (PF) 100 MCG/2ML IJ SOLN
25.0000 ug | Freq: Once | INTRAMUSCULAR | Status: AC
  Administered 2020-07-29: 25 ug via INTRAVENOUS
  Filled 2020-07-29: qty 2

## 2020-07-29 SURGICAL SUPPLY — 44 items
BLADE CLIPPER SURG (BLADE) ×2 IMPLANT
CANISTER SUCT 3000ML PPV (MISCELLANEOUS) ×2 IMPLANT
CHLORAPREP W/TINT 26 (MISCELLANEOUS) ×2 IMPLANT
COVER SURGICAL LIGHT HANDLE (MISCELLANEOUS) ×4 IMPLANT
COVER WAND RF STERILE (DRAPES) ×2 IMPLANT
DRAIN CHANNEL 19F RND (DRAIN) ×2 IMPLANT
DRAPE LAPAROSCOPIC ABDOMINAL (DRAPES) ×2 IMPLANT
DRAPE WARM FLUID 44X44 (DRAPES) ×2 IMPLANT
DRSG OPSITE POSTOP 4X10 (GAUZE/BANDAGES/DRESSINGS) ×2 IMPLANT
DRSG OPSITE POSTOP 4X8 (GAUZE/BANDAGES/DRESSINGS) IMPLANT
ELECT BLADE 6.5 EXT (BLADE) IMPLANT
ELECT CAUTERY BLADE 6.4 (BLADE) ×2 IMPLANT
ELECT REM PT RETURN 9FT ADLT (ELECTROSURGICAL) ×2
ELECTRODE REM PT RTRN 9FT ADLT (ELECTROSURGICAL) ×1 IMPLANT
EVACUATOR SILICONE 100CC (DRAIN) ×2 IMPLANT
GLOVE BIO SURGEON STRL SZ7.5 (GLOVE) ×2 IMPLANT
GOWN STRL REUS W/ TWL LRG LVL3 (GOWN DISPOSABLE) ×2 IMPLANT
GOWN STRL REUS W/TWL LRG LVL3 (GOWN DISPOSABLE) ×4
HANDLE SUCTION POOLE (INSTRUMENTS) ×1 IMPLANT
KIT BASIN OR (CUSTOM PROCEDURE TRAY) ×2 IMPLANT
KIT TURNOVER KIT B (KITS) ×2 IMPLANT
LIGASURE IMPACT 36 18CM CVD LR (INSTRUMENTS) IMPLANT
NS IRRIG 1000ML POUR BTL (IV SOLUTION) ×4 IMPLANT
PACK GENERAL/GYN (CUSTOM PROCEDURE TRAY) ×2 IMPLANT
PAD ARMBOARD 7.5X6 YLW CONV (MISCELLANEOUS) ×2 IMPLANT
PENCIL SMOKE EVACUATOR (MISCELLANEOUS) ×2 IMPLANT
SPECIMEN JAR LARGE (MISCELLANEOUS) IMPLANT
SPONGE GAUZE 2X2 8PLY STRL LF (GAUZE/BANDAGES/DRESSINGS) ×2 IMPLANT
SPONGE LAP 18X18 RF (DISPOSABLE) ×6 IMPLANT
STAPLER VISISTAT 35W (STAPLE) ×2 IMPLANT
SUCTION POOLE HANDLE (INSTRUMENTS) ×2
SUT NOVA 1 T20/GS 25DT (SUTURE) ×8 IMPLANT
SUT PDS AB 1 TP1 96 (SUTURE) ×4 IMPLANT
SUT SILK 2 0 SH CR/8 (SUTURE) ×2 IMPLANT
SUT SILK 2 0 TIES 10X30 (SUTURE) ×2 IMPLANT
SUT SILK 3 0 SH CR/8 (SUTURE) ×2 IMPLANT
SUT SILK 3 0 TIES 10X30 (SUTURE) ×2 IMPLANT
SUT VIC AB 3-0 SH 18 (SUTURE) IMPLANT
SUT VIC AB 3-0 SH 27 (SUTURE) ×2
SUT VIC AB 3-0 SH 27X BRD (SUTURE) ×1 IMPLANT
TAPE CLOTH 2X10 TAN LF (GAUZE/BANDAGES/DRESSINGS) ×2 IMPLANT
TOWEL GREEN STERILE (TOWEL DISPOSABLE) ×2 IMPLANT
TRAY FOLEY MTR SLVR 16FR STAT (SET/KITS/TRAYS/PACK) IMPLANT
YANKAUER SUCT BULB TIP NO VENT (SUCTIONS) ×2 IMPLANT

## 2020-07-29 NOTE — ED Notes (Signed)
ED Provider at bedside. 

## 2020-07-29 NOTE — Anesthesia Preprocedure Evaluation (Addendum)
Anesthesia Evaluation  Patient identified by MRN, date of birth, ID band Patient awake    Reviewed: Allergy & Precautions, H&P , NPO status , Patient's Chart, lab work & pertinent test results  Airway Mallampati: II  TM Distance: >3 FB Neck ROM: Full    Dental no notable dental hx. (+) Teeth Intact, Dental Advisory Given   Pulmonary asthma ,    Pulmonary exam normal breath sounds clear to auscultation       Cardiovascular hypertension, Pt. on medications  Rhythm:Regular Rate:Normal     Neuro/Psych negative neurological ROS  negative psych ROS   GI/Hepatic negative GI ROS, Neg liver ROS,   Endo/Other  Morbid obesity  Renal/GU negative Renal ROS  negative genitourinary   Musculoskeletal   Abdominal   Peds  Hematology negative hematology ROS (+)   Anesthesia Other Findings   Reproductive/Obstetrics negative OB ROS                            Anesthesia Physical Anesthesia Plan  ASA: III and emergent  Anesthesia Plan: General   Post-op Pain Management:    Induction: Intravenous, Rapid sequence and Cricoid pressure planned  PONV Risk Score and Plan: 4 or greater and Ondansetron, Dexamethasone, Midazolam and Scopolamine patch - Pre-op  Airway Management Planned: Oral ETT  Additional Equipment:   Intra-op Plan:   Post-operative Plan: Extubation in OR  Informed Consent: I have reviewed the patients History and Physical, chart, labs and discussed the procedure including the risks, benefits and alternatives for the proposed anesthesia with the patient or authorized representative who has indicated his/her understanding and acceptance.     Dental advisory given  Plan Discussed with: CRNA  Anesthesia Plan Comments:        Anesthesia Quick Evaluation

## 2020-07-29 NOTE — ED Notes (Signed)
Phone report called to [facility] RN, all questions answered.

## 2020-07-29 NOTE — Progress Notes (Signed)
Patient ID: Kaitlyn Bray, female   DOB: 09/06/1965, 55 y.o.   MRN: [id number] Patient examined and I reviewed her CT scan.  I agree with Dr. Billey Chang evaluation.  We will proceed with exploratory laparotomy, repair of incarcerated hernia and possible bowel resection.  I discussed the procedure, risks and benefits with her.  She is agreeable. [name], MD, MPH, FACS Please use [email or web address] to contact on call provider

## 2020-07-29 NOTE — Progress Notes (Signed)
Patient just arrived to room from ED and now OR called for patient at this time. Patient and family updated.

## 2020-07-29 NOTE — Transfer of Care (Signed)
Immediate Anesthesia Transfer of Care Note  Patient: Kaitlyn Bray  Procedure(s) Performed: EXPLORATORY LAPAROTOMY, REPAIR OF VENTRAL HERNIA (N/A )  Patient Location: PACU  Anesthesia Type:General  Level of Consciousness: awake, alert  and oriented  Airway & Oxygen Therapy: Patient Spontanous Breathing and Patient connected to nasal cannula oxygen  Post-op Assessment: Report given to RN and Post -op Vital signs reviewed and stable  Post vital signs: Reviewed and stable  Last Vitals:  Vitals Value Taken Time  BP 146/87 07/29/20 2132  Temp    Pulse 83 07/29/20 2138  Resp 13 07/29/20 2138  SpO2 100 % 07/29/20 2138  Vitals shown include unvalidated device data.  Last Pain:  Vitals:   07/29/20 1752  TempSrc: Oral  PainSc:          Complications: No complications documented.

## 2020-07-29 NOTE — Anesthesia Procedure Notes (Signed)
Procedure Name: Intubation Date/Time: 07/29/2020 7:53 PM Performed by: Babs Bertin, CRNA Pre-anesthesia Checklist: Patient identified, Emergency Drugs available, Suction available and Patient being monitored Patient Re-evaluated:Patient Re-evaluated prior to induction Oxygen Delivery Method: Circle System Utilized Preoxygenation: Pre-oxygenation with 100% oxygen Induction Type: IV induction and Rapid sequence Laryngoscope Size: Mac and 3 Grade View: Grade I Tube type: Oral Tube size: 7.0 mm Number of attempts: 1 Airway Equipment and Method: Stylet and Oral airway Placement Confirmation: ETT inserted through vocal cords under direct vision,  positive ETCO2 and breath sounds checked- equal and bilateral Secured at: 21 cm Tube secured with: Tape Dental Injury: Teeth and Oropharynx as per pre-operative assessment

## 2020-07-29 NOTE — Anesthesia Postprocedure Evaluation (Signed)
Anesthesia Post Note  Patient: Kaitlyn Bray  Procedure(s) Performed: EXPLORATORY LAPAROTOMY, REPAIR OF VENTRAL HERNIA (N/A )     Patient location during evaluation: PACU Anesthesia Type: General Level of consciousness: awake and alert Pain management: pain level controlled Vital Signs Assessment: post-procedure vital signs reviewed and stable Respiratory status: spontaneous breathing, nonlabored ventilation, respiratory function stable and patient connected to nasal cannula oxygen Cardiovascular status: blood pressure returned to baseline and stable Postop Assessment: no apparent nausea or vomiting Anesthetic complications: no   No complications documented.  Last Vitals:  Vitals:   07/29/20 2145 07/29/20 2200  BP: (!) 156/99 (!) 144/81  Pulse: 83 84  Resp: 11 11  Temp:    SpO2: 100% 97%    Last Pain:  Vitals:   07/29/20 2200  TempSrc:   PainSc: 7                  Chasta Deshpande,W. EDMOND

## 2020-07-29 NOTE — H&P (Addendum)
[facility] Admission Note  Kaitlyn Bray 08-08-1965  [id number].    Requesting MD: Gerhard Munch Chief Complaint: Abdominal pain Reason for Consult: Incarcerated ventral hernia Primary care:  Nicki Reaper - [facility]  HPI: Patient is a 55 year old female who presented to the ED with abdominal pain.  Patient describes nausea, vomiting, anorexia, p.o. intolerance that started yesterday. No prior hx of this.  She thought it was food poisoning at first.  Pain Right mid lower abdomen over her hernia She has not been able to have a recent bowel movement.  She has a known abdominal hernia and a history of hypertension. She has a know hernia and had her appendix removed by Dr. Claud Kelp when they did her hysterectomy.  She was told she needed to lose weight and come back for a hernia repair then.  She thinks that was 10 plus years ago.  She has not followed up.  Work-up in the ED patient is afebrile, temperature 99.  Blood pressure 162/101, heart rate 88, sats are 98% on room air.  CMP is normal except for chloride of 97 glucose of 131.  Lipase 27, WBC 11.5, H/H 14.5/hematocrit 45.8, platelets are 206,000.  Lipase is normal.  CT scan shows a moderate sized right lower abdominal ventral wall hernia with small bowel loops and resulting small bowel obstruction.  Left lower abdominal wall ventral hernia contains fluid no bowel within this hernia.  The prior history of abdominal hysterectomy, C-section, and myomectomy.  Covid is pending.  I have just ordered a lactate.   ROS: Review of Systems  Constitutional: Positive for fever (99.3).  HENT: Negative.   Eyes: Negative.   Respiratory:       She sleeps in a chair because of hx of vertigo. She reports being positive for COVID 9-10 weeks ago. She has had 2 vaccines, but not the booster.  Cardiovascular: Positive for leg swelling (some). Negative for chest pain, palpitations, orthopnea, claudication and PND.  Gastrointestinal:  Positive for abdominal pain, constipation, heartburn, nausea and vomiting.       She has not had a colonoscopy  Genitourinary: Negative.   Musculoskeletal: Negative.   Skin: Negative.   Neurological: Positive for headaches (in the past, none recently).       Hx of vertigo  Endo/Heme/Allergies: Negative.   Psychiatric/Behavioral: Negative.     Family History  Problem Relation Age of Onset  . Hypertension Mother   . Stroke Mother   . Dementia Mother   . Heart disease Mother   . Hyperlipidemia Mother   . COPD Father   . Arthritis Father   . Mental illness Father   . Heart disease Maternal Grandmother   . Heart disease Maternal Grandfather   . Alzheimer's disease Maternal Grandfather   . Uterine cancer Paternal Grandmother     Past Medical History:  Diagnosis Date  . Asthma   . Hernia   . Hypertension     Past Surgical History:  Procedure Laterality Date  . ABDOMINAL HYSTERECTOMY    . CESAREAN SECTION    . MYOMECTOMY      Social History:  reports that she has never smoked. She has never used smokeless tobacco. She reports that she does not drink alcohol and does not use drugs.  Allergies:  Allergies  Allergen Reactions  . Codeine     Fast heart rate  . Penicillins Rash  . Sulfa Drugs Cross Reactors Rash    Prior to Admission medications   Medication  Sig Start Date End Date Taking? Authorizing Provider  albuterol (VENTOLIN HFA) 108 (90 Base) MCG/ACT inhaler Inhale 1-2 puffs into the lungs every 6 (six) hours as needed for wheezing or shortness of breath. 05/24/20  Yes Avegno, Zachery Dakins, FNP  benzonatate (TESSALON) 100 MG capsule Take 1 capsule (100 mg total) by mouth 3 (three) times daily as needed for cough. 05/24/20  Yes Avegno, Zachery Dakins, FNP  cetirizine (ZYRTEC) 10 MG tablet Take 10 mg by mouth daily.   Yes [provider]  cholecalciferol (VITAMIN D3) 25 MCG (1000 UNIT) tablet Take 5,000 Units by mouth daily.   Yes [provider]  ibuprofen  (ADVIL,MOTRIN) 200 MG tablet Take 200 mg by mouth every 6 (six) hours as needed.   Yes [provider]  losartan-hydrochlorothiazide (HYZAAR) 50-12.5 MG tablet Take 1 tablet by mouth daily. 07/08/20  Yes Lorre Munroe, NP  meclizine (ANTIVERT) 25 MG tablet Take 1 tablet (25 mg total) by mouth 3 (three) times daily as needed for dizziness or nausea. 03/20/19  Yes Baity, Salvadore Oxford, NP  fluticasone (FLONASE) 50 MCG/ACT nasal spray Place into both nostrils daily.    [provider]  predniSONE (DELTASONE) 10 MG tablet Take 2 tablets (20 mg total) by mouth daily. Patient not taking: No sig reported 05/24/20   Avegno, Zachery Dakins, FNP  Vitamin D, Ergocalciferol, (DRISDOL) 1.25 MG (50000 UNIT) CAPS capsule Take 1 capsule (50,000 Units total) by mouth every 7 (seven) days. Patient not taking: No sig reported 07/09/19   Lorre Munroe, NP     Blood pressure (!) 162/101, pulse 88, temperature 99 F (37.2 C), resp. rate 20, SpO2 98 %. Physical Exam:  General: pleasant, WD, WN white female who is laying in bed in NAD, abdominal pain right lower abdomen periumbilical area. HEENT: head is normocephalic, atraumatic.  Sclera are noninjected.  Pupils are equal.  Ears and nose without any masses or lesions.  Mouth is pink and moist Heart: regular, rate, and rhythm.  Normal s1,s2. No Aortic murmur, also heard LSB and apex, no gallops, or rubs noted.  Palpable radial and pedal pulses bilaterally Lungs: CTAB, no wheezes, rhonchi, or rales noted.  Respiratory effort nonlabored Abd: soft, with palpable VH right side of midline about the level of the umbilicus.  Site is very tender and sore,  I spent some time trying to reduce it.  I think I at least partially reduced it but still some palpable hernia and + BS, especially over the hernia site its self.  She does not have peritonitis and pain is currently her biggest issue. No further nausea or vomiting.   MS: all 4 extremities are symmetrical with no  cyanosis, clubbing, or edema. Skin: warm and dry with no masses, lesions, or rashes Neuro: Cranial nerves 2-12 grossly intact, sensation is normal throughout Psych: A&Ox3 with an appropriate affect.   Results for orders placed or performed during the hospital encounter of 07/29/20 (from the past 48 hour(s))  Urinalysis, Routine w reflex microscopic Urine, Clean Catch     Status: None   Collection Time: 07/29/20  8:06 AM  Result Value Ref Range   Color, Urine YELLOW YELLOW   APPearance CLEAR CLEAR   Specific Gravity, Urine 1.015 1.005 - 1.030   pH 6.0 5.0 - 8.0   Glucose, UA NEGATIVE NEGATIVE mg/dL   Hgb urine dipstick NEGATIVE NEGATIVE   Bilirubin Urine NEGATIVE NEGATIVE   Ketones, ur NEGATIVE NEGATIVE mg/dL   Protein, ur NEGATIVE NEGATIVE mg/dL  Nitrite NEGATIVE NEGATIVE   Leukocytes,Ua NEGATIVE NEGATIVE    Comment: Performed at [facility], [address] [place], [place] [address]  Lipase, blood     Status: None   Collection Time: 07/29/20  8:09 AM  Result Value Ref Range   Lipase 27 11 - 51 U/L    Comment: Performed at [facility], [address] [place], [place] [address]  Comprehensive metabolic panel     Status: Abnormal   Collection Time: 07/29/20  8:09 AM  Result Value Ref Range   Sodium 138 135 - 145 mmol/L   Potassium 4.1 3.5 - 5.1 mmol/L   Chloride 97 (L) 98 - 111 mmol/L   CO2 32 22 - 32 mmol/L   Glucose, Bld 131 (H) 70 - 99 mg/dL    Comment: Glucose reference range applies only to samples taken after fasting for at least 8 hours.   BUN 11 6 - 20 mg/dL   Creatinine, Ser 3.240.76 0.44 - 1.00 mg/dL   Calcium 9.6 8.9 - 40.110.3 mg/dL   Total Protein 7.4 6.5 - 8.1 g/dL   Albumin 3.9 3.5 - 5.0 g/dL   AST 18 15 - 41 U/L   ALT 19 0 - 44 U/L   Alkaline Phosphatase 73 38 - 126 U/L   Total Bilirubin 0.8 0.3 - 1.2 mg/dL   GFR, Estimated >02>60 >72>60 mL/min    Comment: (NOTE) Calculated using the CKD-EPI Creatinine Equation (2021)    Anion gap 9 5 - 15    Comment:  Performed at [facility], [address] [place], [place] [address]  CBC     Status: Abnormal   Collection Time: 07/29/20  8:09 AM  Result Value Ref Range   WBC 11.5 (H) 4.0 - 10.5 K/uL   RBC 5.21 (H) 3.87 - 5.11 MIL/uL   Hemoglobin 14.5 12.0 - 15.0 g/dL   HCT 40.345.8 47.436.0 - 25.946.0 %   MCV 87.9 80.0 - 100.0 fL   MCH 27.8 26.0 - 34.0 pg   MCHC 31.7 30.0 - 36.0 g/dL   RDW 56.313.4 87.511.5 - 64.315.5 %   Platelets 260 150 - 400 K/uL   nRBC 0.0 0.0 - 0.2 %    Comment: Performed at [facility], [address] [place], [place] [address]   CT Abdomen Pelvis W Contrast  Result Date: 07/29/2020 CLINICAL DATA:  Generalized abdominal pain, nausea, vomiting EXAM: CT ABDOMEN AND PELVIS WITH CONTRAST TECHNIQUE: Multidetector CT imaging of the abdomen and pelvis was performed using the standard protocol following bolus administration of intravenous contrast. CONTRAST:  100mL OMNIPAQUE IOHEXOL 300 MG/ML  SOLN COMPARISON:  10/12/2015 FINDINGS: Lower chest: Lung bases are clear. No effusions. Heart is normal size. Hepatobiliary: Diffuse low-density throughout the liver compatible with fatty infiltration. No focal abnormality. Gallbladder mildly distended. No biliary ductal dilatation. Pancreas: No focal abnormality or ductal dilatation. Spleen: No focal abnormality.  Normal size. Adrenals/Urinary Tract: No adrenal abnormality. No focal renal abnormality. No stones or hydronephrosis. Urinary bladder is unremarkable. Stomach/Bowel: Descending colonic and sigmoid diverticulosis. No active diverticulitis. Bilateral lower abdominal wall ventral hernias noted. The right lower abdominal wall hernia contains multiple small bowel loops. There is evidence of small-bowel obstruction. Distal small bowel decompressed. Small bowel proximal to the hernia dilated and fluid-filled. Vascular/Lymphatic: No evidence of aneurysm or adenopathy. Reproductive: Prior hysterectomy.  No adnexal masses. Other: No free air. Small amount of  fluid within the left lower abdominal wall ventral hernia. Musculoskeletal:  No acute bony abnormality. IMPRESSION: Moderate-sized right lower abdominal wall ventral hernia contains small bowel loops with resulting small bowel obstruction. Left lower abdominal wall ventral hernia contains fluid. No bowel within this hernia. Hepatic steatosis. Electronically Signed   By: Charlett Nose M.D.   On: 07/29/2020 09:44      Assessment/Plan Hx heart Murmur - no prior evaluation Hypertension - currently elevated Hx C section, myomectomy, hysterectomy with appendectomy - last procedure ~ 10 years ago COVID positive 9-10 weeks ago Hx constipation   SBO with incarcerated Ventral hernia  Plan:  I layed her down, she had received fentanyl just before I came in, and her pain was better.  I spent about 10 minutes trying to reduce the hernia, with some success.  I do not think I totally reduced the hernia. I have left her lying flat, ordered IV robaxin, tramadol and Morphine for pain and hopefull relax her enough to allow the hernia to reduce further. Lactate is pending also.  She has a long standing murmur since childhood that has neve been evaluated.  I have ask Medicine to see and evaluate this.  I will tentatively  plan to admit her and will see where we are shortly.   She is currently NPO, IV fluids, IV Toradol, Robaxin, and Morphine are ordered, Medicine consult to evaluate murmur also.   Lactate 1.2  Dr. Carolynne Edouard has seen and reviewed patient.  She needs surgery tonight.  Will complete admission and permit.    Sherrie George [facility] 07/29/2020, 10:13 AM Please see Amion for pager number during day hours 7:00am-4:30pm

## 2020-07-29 NOTE — ED Triage Notes (Addendum)
Pt reports she is here today due to LQ pain.Pt reports since yesterday she started having abd pain.Pt reports " I would like a CT scan to make sure my abd is okay'. Pt denies any abd surgeries . Pt is hypertensive in triage . Pt reports she has h/o

## 2020-07-29 NOTE — Consult Note (Signed)
Hospitalist Service Medical Consultation   Kaitlyn Bray  NGE:[id number]  DOB: 06/02/1965  DOA: 07/29/2020  PCP: Lorre Munroe, NP    Requesting physician: Dr. Jeraldine Loots, ED PA [place], [facility] surgery  Reason for consultation: Medical management of chronic medical problem  History of Present Illness: Kaitlyn Bray is an 55 y.o. female with PMH significant for HTN, obesity, well-controlled intermittent asthma "heart murmur", previous history of COVID-19 infection 2 months ago and previous ventral hernia secondary to prior appendectomy, c-section and hysterectomy was in her USO H until yesterday when she developed abdominal pain.  This was subsequently associated with a feeling of "stopped up".  Associated with profuse nausea and vomiting.  Patient was evaluated in the ED where she was noted to have lower abdominal ventral wall hernia with small bowel loops and resulting small bowel obstruction.  Patient was seen by general surgery in the ED where attempt was made to reduce the hernia which was partially but not completely successful.  Patient is now being admitted to general surgery service and [facility] hospitalists are asked to consult to assist with medical problems.  Other than patient's abdominal pain nausea and vomiting, patient states she is otherwise well.  She has no chest pain or shortness of breath.  Does not have any chest pain or shortness of breath prior to this intercurrent illness.  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  She does note that she was told to lose about 50 pounds by her previous surgeon about 10 years ago however she has gained about another 25 to 30 pounds since the shutdown for Covid.  Patient denies any history of heart failure, pulmonary edema or diabetes although states that she does have prediabetes.  She denies polyuria or polydipsia.  She denies any long-term consequences of  COVID-19.  She states that while she did have Covid she was using her albuterol rescue inhaler more however has not needed any in the last month.   Review of Systems:  As per HPI otherwise 10 point review of systems negative.   Past Medical History: Past Medical History:  Diagnosis Date  . Asthma   . Hernia   . Hypertension     Past Surgical History: Past Surgical History:  Procedure Laterality Date  . ABDOMINAL HYSTERECTOMY    . CESAREAN SECTION    . MYOMECTOMY       Allergies:   Allergies  Allergen Reactions  . Codeine     Fast heart rate  . Penicillins Rash  . Sulfa Drugs Cross Reactors Rash     Social History:  reports that she has never smoked. She has never used smokeless tobacco. She reports that she does not drink alcohol and does not use drugs.   Family History: Family History  Problem Relation Age of Onset  . Hypertension Mother   . Stroke Mother   . Dementia Mother   . Heart disease Mother   . Hyperlipidemia Mother   . COPD Father   . Arthritis Father   . Mental illness Father   . Heart disease Maternal Grandmother   . Heart disease Maternal Grandfather   . Alzheimer's disease Maternal Grandfather   . Uterine cancer Paternal Grandmother      Physical Exam: Vitals:   07/29/20 0915 07/29/20 0945 07/29/20 1000 07/29/20 1215  BP:  (!) 166/86 (!) 162/101 (!) 160/91  Pulse: 89 88 88 91  Resp: 17 14 20 16   Temp:      SpO2: 94% 98% 98% 95%    Constitutional: Obese female lying flat in bed, alert and awake, oriented x3, not in any acute distress. Eyes: sclera anicteric, conjuctiva clear, no lid lag, no exophthalmos, EOMI CVS: S1-S2 clear, no murmur rubs or gallops, no LE edema, normal pedal pulses  Respiratory: Decreased air entry, difficult to hear secondary to body habitus. GI: Absent bowel sounds.  Soft.  Tenderness at right side of midline at hernia. LE: No edema. No cyanosis Neuro: grossly nonfocal.  Psych: patient is logical and  coherent, judgement and insight appear normal, mood and affect appropriate to situation. Skin: no rashes or lesions or ulcers,    Data reviewed:  I have personally reviewed following labs and imaging studies Labs:  CBC: Recent Labs  Lab 07/29/20 0809  WBC 11.5*  HGB 14.5  HCT 45.8  MCV 87.9  PLT 260    Basic Metabolic Panel: Recent Labs  Lab 07/29/20 0809  NA 138  K 4.1  CL 97*  CO2 32  GLUCOSE 131*  BUN 11  CREATININE 0.76  CALCIUM 9.6   GFR CrCl cannot be calculated (Unknown ideal weight.). Liver Function Tests: Recent Labs  Lab 07/29/20 0809  AST 18  ALT 19  ALKPHOS 73  BILITOT 0.8  PROT 7.4  ALBUMIN 3.9   Recent Labs  Lab 07/29/20 0809  LIPASE 27   No results for input(s): AMMONIA in the last 168 hours. Coagulation profile No results for input(s): INR, PROTIME in the last 168 hours.  Cardiac Enzymes: No results for input(s): CKTOTAL, CKMB, CKMBINDEX, TROPONINI in the last 168 hours. BNP: Invalid input(s): POCBNP CBG: No results for input(s): GLUCAP in the last 168 hours. D-Dimer No results for input(s): DDIMER in the last 72 hours. Hgb A1c No results for input(s): HGBA1C in the last 72 hours. Lipid Profile No results for input(s): CHOL, HDL, LDLCALC, TRIG, CHOLHDL, LDLDIRECT in the last 72 hours. Thyroid function studies No results for input(s): TSH, T4TOTAL, T3FREE, THYROIDAB in the last 72 hours.  Invalid input(s): FREET3 Anemia work up No results for input(s): VITAMINB12, FOLATE, FERRITIN, TIBC, IRON, RETICCTPCT in the last 72 hours. Urinalysis    Component Value Date/Time   COLORURINE YELLOW 07/29/2020 0806   APPEARANCEUR CLEAR 07/29/2020 0806   LABSPEC 1.015 07/29/2020 0806   PHURINE 6.0 07/29/2020 0806   GLUCOSEU NEGATIVE 07/29/2020 0806   HGBUR NEGATIVE 07/29/2020 0806   BILIRUBINUR NEGATIVE 07/29/2020 0806   BILIRUBINUR Negative 06/23/2019 0813   KETONESUR NEGATIVE 07/29/2020 0806   PROTEINUR NEGATIVE 07/29/2020 0806    UROBILINOGEN 0.2 06/23/2019 0813   UROBILINOGEN 0.2 10/21/2017 1434   NITRITE NEGATIVE 07/29/2020 0806   LEUKOCYTESUR NEGATIVE 07/29/2020 0806     Sepsis Labs Invalid input(s): PROCALCITONIN,  WBC,  LACTICIDVEN Microbiology No results found for this or any previous visit (from the past 240 hour(s)).     Inpatient Medications:   Scheduled Meds: Continuous Infusions: . dextrose 5 % and 0.9 % NaCl with KCl 20 mEq/L 75 mL/hr at 07/29/20 1225  . methocarbamol (ROBAXIN) IV       Radiological Exams on Admission: CT Abdomen Pelvis W Contrast  Result Date: 07/29/2020 CLINICAL DATA:  Generalized abdominal pain, nausea, vomiting EXAM: CT ABDOMEN AND PELVIS WITH CONTRAST TECHNIQUE: Multidetector CT imaging of the abdomen and pelvis was performed using the standard protocol following bolus administration of intravenous contrast. CONTRAST:  07/31/2020 OMNIPAQUE IOHEXOL  300 MG/ML  SOLN COMPARISON:  10/12/2015 FINDINGS: Lower chest: Lung bases are clear. No effusions. Heart is normal size. Hepatobiliary: Diffuse low-density throughout the liver compatible with fatty infiltration. No focal abnormality. Gallbladder mildly distended. No biliary ductal dilatation. Pancreas: No focal abnormality or ductal dilatation. Spleen: No focal abnormality.  Normal size. Adrenals/Urinary Tract: No adrenal abnormality. No focal renal abnormality. No stones or hydronephrosis. Urinary bladder is unremarkable. Stomach/Bowel: Descending colonic and sigmoid diverticulosis. No active diverticulitis. Bilateral lower abdominal wall ventral hernias noted. The right lower abdominal wall hernia contains multiple small bowel loops. There is evidence of small-bowel obstruction. Distal small bowel decompressed. Small bowel proximal to the hernia dilated and fluid-filled. Vascular/Lymphatic: No evidence of aneurysm or adenopathy. Reproductive: Prior hysterectomy.  No adnexal masses. Other: No free air. Small amount of fluid within the left  lower abdominal wall ventral hernia. Musculoskeletal: No acute bony abnormality. IMPRESSION: Moderate-sized right lower abdominal wall ventral hernia contains small bowel loops with resulting small bowel obstruction. Left lower abdominal wall ventral hernia contains fluid. No bowel within this hernia. Hepatic steatosis. Electronically Signed   By: Charlett Nose M.D.   On: 07/29/2020 09:44    Impression/Recommendations Active Problems:   * No active hospital problems. *  55 year old female with HTN, well-controlled asthma and "heart murmur" is admitted to general surgery service with incarcerated ventral hernia with small bowel obstruction.  Ventral hernia Further management per general surgery  HTN Hold patient's losartan/HCTZ since she is n.p.o. Metoprolol 5 mg IV twice daily ordered with hold parameters  "Heart murmur" Will order echocardiogram  Asthma Mild, intermittent To new as needed albuterol rescue inhaler.  Prediabetes Will order hemoglobin A1c for the morning   Thank you for this consultation.  Our [facility] hospitalist team will follow the patient with you.    Pieter Partridge M.D. [facility] 07/29/2020, 12:57 PM Please page Via [email or web address] for questions

## 2020-07-29 NOTE — ED Provider Notes (Signed)
[facility] EMERGENCY DEPARTMENT Provider Note   CSN: [id number] Arrival date & time: 07/29/20  0755     History Chief Complaint  Patient presents with  . Abdominal Pain    Kaitlyn Bray is a 55 y.o. female.  HPI Patient presents with concern of abdominal pain, nausea, vomiting, anorexia and p.o. intolerance.  Patient states that she is generally well, though she does have a history of hypertension and known abdominal hernia.  She has no history of abdominal surgery.  Now since yesterday the patient has had ongoing diffuse roughly midline abdominal pain.  Pain is sore, severe, and she has not been able to take medication for relief. There is associated anorexia, nausea and she has had several episodes of vomiting. No bowel movement changes. She notes that her temperature is typically around 97 degrees and it has been 99 degrees recently. No other complaints.    Past Medical History:  Diagnosis Date  . Asthma   . Hernia   . Hypertension     Patient Active Problem List   Diagnosis Date Noted  . Urinary incontinence 06/23/2019  . Constipation 06/23/2019  . BPPV (benign paroxysmal positional vertigo) 03/20/2019  . HTN (hypertension) 04/02/2017  . Asthma 04/02/2017    Past Surgical History:  Procedure Laterality Date  . ABDOMINAL HYSTERECTOMY    . CESAREAN SECTION    . MYOMECTOMY       OB History   No obstetric history on file.     Family History  Problem Relation Age of Onset  . Hypertension Mother   . Stroke Mother   . Dementia Mother   . Heart disease Mother   . Hyperlipidemia Mother   . COPD Father   . Arthritis Father   . Mental illness Father   . Heart disease Maternal Grandmother   . Heart disease Maternal Grandfather   . Alzheimer's disease Maternal Grandfather   . Uterine cancer Paternal Grandmother     Social History   Tobacco Use  . Smoking status: Never Smoker  . Smokeless tobacco: Never Used  Vaping Use  . Vaping  Use: Never used  Substance Use Topics  . Alcohol use: No  . Drug use: No    Home Medications Prior to Admission medications   Medication Sig Start Date End Date Taking? Authorizing Provider  albuterol (VENTOLIN HFA) 108 (90 Base) MCG/ACT inhaler Inhale 1-2 puffs into the lungs every 6 (six) hours as needed for wheezing or shortness of breath. 05/24/20  Yes Avegno, Zachery Dakins, FNP  benzonatate (TESSALON) 100 MG capsule Take 1 capsule (100 mg total) by mouth 3 (three) times daily as needed for cough. 05/24/20  Yes Avegno, Zachery Dakins, FNP  cetirizine (ZYRTEC) 10 MG tablet Take 10 mg by mouth daily.   Yes [provider]  cholecalciferol (VITAMIN D3) 25 MCG (1000 UNIT) tablet Take 5,000 Units by mouth daily.   Yes [provider]  ibuprofen (ADVIL,MOTRIN) 200 MG tablet Take 200 mg by mouth every 6 (six) hours as needed.   Yes [provider]  losartan-hydrochlorothiazide (HYZAAR) 50-12.5 MG tablet Take 1 tablet by mouth daily. 07/08/20  Yes Lorre Munroe, NP  meclizine (ANTIVERT) 25 MG tablet Take 1 tablet (25 mg total) by mouth 3 (three) times daily as needed for dizziness or nausea. 03/20/19  Yes Baity, Salvadore Oxford, NP  fluticasone (FLONASE) 50 MCG/ACT nasal spray Place into both nostrils daily.    [provider]  predniSONE (DELTASONE) 10 MG tablet  Take 2 tablets (20 mg total) by mouth daily. Patient not taking: No sig reported 05/24/20   Avegno, Zachery Dakins, FNP  Vitamin D, Ergocalciferol, (DRISDOL) 1.25 MG (50000 UNIT) CAPS capsule Take 1 capsule (50,000 Units total) by mouth every 7 (seven) days. Patient not taking: No sig reported 07/09/19   Lorre Munroe, NP    Allergies    Codeine, Penicillins, and Sulfa drugs cross reactors  Review of Systems   Review of Systems  Constitutional:       Per HPI, otherwise negative  HENT:       Per HPI, otherwise negative  Respiratory:       Per HPI, otherwise negative  Cardiovascular:       Per HPI, otherwise  negative  Gastrointestinal: Positive for abdominal pain, nausea and vomiting.  Endocrine:       Negative aside from HPI  Genitourinary:       Neg aside from HPI   Musculoskeletal:       Per HPI, otherwise negative  Skin: Negative.   Neurological: Negative for syncope.    Physical Exam Updated Vital Signs BP (!) 162/101 (BP Location: Right Arm)   Pulse 88   Temp 99 F (37.2 C)   Resp 20   SpO2 98%   Physical Exam Vitals and nursing note reviewed.  Constitutional:      General: She is not in acute distress.    Appearance: She is well-developed. She is obese.  HENT:     Head: Normocephalic and atraumatic.  Eyes:     Conjunctiva/sclera: Conjunctivae normal.  Cardiovascular:     Rate and Rhythm: Normal rate and regular rhythm.  Pulmonary:     Effort: Pulmonary effort is normal. No respiratory distress.     Breath sounds: Normal breath sounds. No stridor.  Abdominal:     General: There is no distension.     Tenderness: There is generalized abdominal tenderness. There is guarding.  Skin:    General: Skin is warm and dry.  Neurological:     Mental Status: She is alert and oriented to person, place, and time.     Cranial Nerves: No cranial nerve deficit.      ED Results / Procedures / Treatments   Labs (all labs ordered are listed, but only abnormal results are displayed) Labs Reviewed  COMPREHENSIVE METABOLIC PANEL - Abnormal; Notable for the following components:      Result Value   Chloride 97 (*)    Glucose, Bld 131 (*)    All other components within normal limits  CBC - Abnormal; Notable for the following components:   WBC 11.5 (*)    RBC 5.21 (*)    All other components within normal limits  SARS CORONAVIRUS 2 (TAT 6-24 HRS)  LIPASE, BLOOD  URINALYSIS, ROUTINE W REFLEX MICROSCOPIC  LACTIC ACID, PLASMA    EKG None  Radiology CT Abdomen Pelvis W Contrast  Result Date: 07/29/2020 CLINICAL DATA:  Generalized abdominal pain, nausea, vomiting EXAM: CT  ABDOMEN AND PELVIS WITH CONTRAST TECHNIQUE: Multidetector CT imaging of the abdomen and pelvis was performed using the standard protocol following bolus administration of intravenous contrast. CONTRAST:  OMNIPAQUE IOHEXOL 300 MG/ML  SOLN COMPARISON:  10/12/2015 FINDINGS: Lower chest: Lung bases are clear. No effusions. Heart is normal size. Hepatobiliary: Diffuse low-density throughout the liver compatible with fatty infiltration. No focal abnormality. Gallbladder mildly distended. No biliary ductal dilatation. Pancreas: No focal abnormality or ductal dilatation. Spleen: No focal abnormality.  Normal size.  Adrenals/Urinary Tract: No adrenal abnormality. No focal renal abnormality. No stones or hydronephrosis. Urinary bladder is unremarkable. Stomach/Bowel: Descending colonic and sigmoid diverticulosis. No active diverticulitis. Bilateral lower abdominal wall ventral hernias noted. The right lower abdominal wall hernia contains multiple small bowel loops. There is evidence of small-bowel obstruction. Distal small bowel decompressed. Small bowel proximal to the hernia dilated and fluid-filled. Vascular/Lymphatic: No evidence of aneurysm or adenopathy. Reproductive: Prior hysterectomy.  No adnexal masses. Other: No free air. Small amount of fluid within the left lower abdominal wall ventral hernia. Musculoskeletal: No acute bony abnormality. IMPRESSION: Moderate-sized right lower abdominal wall ventral hernia contains small bowel loops with resulting small bowel obstruction. Left lower abdominal wall ventral hernia contains fluid. No bowel within this hernia. Hepatic steatosis. Electronically Signed   By: Charlett Nose M.D.   On: 07/29/2020 09:44    Procedures Procedures   Medications Ordered in ED Medications  iohexol (OMNIPAQUE) 300 MG/ML solution 100 mL (100 mLs Intravenous Contrast Given 07/29/20 0923)  fentaNYL (SUBLIMAZE) injection 25 mcg (25 mcg Intravenous Given 07/29/20 1013)  ondansetron (ZOFRAN)  injection 4 mg (4 mg Intravenous Given 07/29/20 1013)    ED Course  I have reviewed the triage vital signs and the nursing notes.  Pertinent labs & imaging results that were available during my care of the patient were reviewed by me and considered in my medical decision making (see chart for details).  On repeat exam the patient is awake and alert.  Notes her pain is somewhat better.  I have reviewed the CT scan, and discussed with our general surgery colleagues.  Now on repeat exam an attempt is made to reduce the patient's hernia.  This is unsuccessful, with substantial guarding against the reduction attempt. Given the patient's demonstrated hernia with bowel obstruction she will require admission for further monitoring, management, to our surgical team. MDM Rules/Calculators/A&P MDM Number of Diagnoses or Management Options SBO (small bowel obstruction) (HCC): new, needed workup   Amount and/or Complexity of Data Reviewed Clinical lab tests: reviewed Tests in the radiology section of CPT: reviewed Tests in the medicine section of CPT: reviewed Decide to obtain previous medical records or to obtain history from someone other than the patient: yes Review and summarize past medical records: yes Discuss the patient with other providers: yes Independent visualization of images, tracings, or specimens: yes  Risk of Complications, Morbidity, and/or Mortality Presenting problems: high Diagnostic procedures: high Management options: high  Critical Care Total time providing critical care: < 30 minutes  Patient Progress Patient progress: stable  Final Clinical Impression(s) / ED Diagnoses Final diagnoses:  SBO (small bowel obstruction) (HCC)     Gerhard Munch, MD 07/29/20 1119

## 2020-07-29 NOTE — Op Note (Addendum)
  07/29/2020  9:20 PM  PATIENT:  Kaitlyn Bray  55 y.o. female  PRE-OPERATIVE DIAGNOSIS: Incarcerated incisional hernia, small bowel obstruction  POST-OPERATIVE DIAGNOSIS: Incarcerated incisional hernia, small bowel obstruction  PROCEDURE:  Procedure(s): EXPLORATORY LAPAROTOMY, PRIMARY REPAIR OF INCARCERATED INCISIONAL HERNIA   SURGEON:  Surgeon(s): [name], MD  ASSISTANTS: none   ANESTHESIA:   general  EBL:  Total I/O In: 1100 [I.V.:1000; IV Piggyback:100] Out: -   BLOOD ADMINISTERED:none  DRAINS: (1) Jackson-Pratt drain(s) with closed bulb suction in the RLQ   SPECIMEN:  No Specimen  DISPOSITION OF SPECIMEN:  N/A  COUNTS:  YES  DICTATION: .Dragon Dictation Findings: Incarcerated incisional hernia x2, right lower quadrant containing small bowel which was viable, left lower quadrant containing fat.  Patient was quite oozy in general.  Procedure in detail: Informed consent was obtained.  She was identified in the preop holding area.  She received intravenous antibiotics.  She was brought to the operating room and general endotracheal anesthesia was administered by the anesthesia staff.  Foley catheter was placed by nursing.  Her abdomen was prepped and draped in sterile fashion.  We did a timeout procedure.  Lower midline incision was made.  Subcutaneous tissues were dissected down revealing the anterior fascia.  This was divided along the midline and the peritoneal cavity was entered above the umbilicus.  The fascia was gradually opened moving inferiorly.  I encountered 2 incisional hernias from her previous Pfannenstiel.  The right lower quadrant contained small bowel.  I enlarged the opening.  I was able to reduce the small bowel back into the abdomen.  It looked chronically incarcerated but was viable without any stricture.  That hernia sac was excised using cautery.  Attention was directed to the left lower quadrant hernia.  The opening was enlarged and it contained  omentum.  This was reduced into the abdomen.  Hemostasis was obtained in the omentum.  This hernia sac was also excised.  I then closed the abdomen and repaired the hernias primarily as part of the closure using interrupted #1 Novafil sutures.  I used a combination of figure-of-eight and simple sutures.  Due to the large subcutaneous pocket I placed a 53 French Blake drain in the right lower quadrant and secured it in the subcutaneous free space.  The area was copiously irrigated hemostasis was ensured.  Skin was closed with staples.  All counts were correct.  She tolerated the procedure well without apparent complication and was taken recovery in stable condition. PATIENT DISPOSITION:  PACU - hemodynamically stable.   Delay start of Pharmacological VTE agent (>24hrs) due to surgical blood loss or risk of bleeding:  yes  [name], MD, MPH, FACS Pager: [phone]  3/17/20229:20 PM

## 2020-07-29 NOTE — ED Notes (Signed)
Got patient on the monitor into a gown patient is resting with call bell in reach  

## 2020-07-30 ENCOUNTER — Inpatient Hospital Stay (HOSPITAL_COMMUNITY): Payer: Self-pay

## 2020-07-30 ENCOUNTER — Encounter (HOSPITAL_COMMUNITY): Payer: Self-pay | Admitting: General Surgery

## 2020-07-30 ENCOUNTER — Other Ambulatory Visit: Payer: Self-pay

## 2020-07-30 DIAGNOSIS — R011 Cardiac murmur, unspecified: Secondary | ICD-10-CM

## 2020-07-30 DIAGNOSIS — K56609 Unspecified intestinal obstruction, unspecified as to partial versus complete obstruction: Secondary | ICD-10-CM

## 2020-07-30 LAB — BASIC METABOLIC PANEL
Anion gap: 9 (ref 5–15)
BUN: 13 mg/dL (ref 6–20)
CO2: 29 mmol/L (ref 22–32)
Calcium: 8.7 mg/dL — ABNORMAL LOW (ref 8.9–10.3)
Chloride: 98 mmol/L (ref 98–111)
Creatinine, Ser: 0.83 mg/dL (ref 0.44–1.00)
GFR, Estimated: >60 mL/min (ref >60)
Glucose, Bld: 170 mg/dL — ABNORMAL HIGH (ref 70–99)
Potassium: 4.1 mmol/L (ref 3.5–5.1)
Sodium: 136 mmol/L (ref 135–145)

## 2020-07-30 LAB — CBC
HCT: 42.2 % (ref 36.0–46.0)
Hemoglobin: 13.6 g/dL (ref 12.0–15.0)
MCH: 28.1 pg (ref 26.0–34.0)
MCHC: 32.2 g/dL (ref 30.0–36.0)
MCV: 87.2 fL (ref 80.0–100.0)
Platelets: 238 10*3/uL (ref 150–400)
RBC: 4.84 MIL/uL (ref 3.87–5.11)
RDW: 13.7 % (ref 11.5–15.5)
WBC: 14.7 10*3/uL — ABNORMAL HIGH (ref 4.0–10.5)
nRBC: 0 % (ref 0.0–0.2)

## 2020-07-30 LAB — HEMOGLOBIN A1C
Hgb A1c MFr Bld: 6.2 % — ABNORMAL HIGH (ref 4.8–5.6)
Mean Plasma Glucose: 131.24 mg/dL

## 2020-07-30 LAB — ECHOCARDIOGRAM COMPLETE
AR max vel: 1.63 cm2
AV Area VTI: 1.76 cm2
AV Area mean vel: 1.55 cm2
AV Mean grad: 8 mmHg
AV Peak grad: 15.8 mmHg
Ao pk vel: 1.99 m/s
Area-P 1/2: 2.99 cm2
Height: 65 in
S' Lateral: 2.4 cm
Weight: 3988.8 oz

## 2020-07-30 LAB — HIV ANTIBODY (ROUTINE TESTING W REFLEX): HIV Screen 4th Generation wRfx: NONREACTIVE

## 2020-07-30 MED ORDER — CHLORHEXIDINE GLUCONATE CLOTH 2 % EX PADS
6.0000 | MEDICATED_PAD | Freq: Every day | CUTANEOUS | Status: DC
  Administered 2020-08-04 – 2020-08-05 (×2): 6 via TOPICAL

## 2020-07-30 MED ORDER — PHENOL 1.4 % MT LIQD
1.0000 | OROMUCOSAL | Status: DC | PRN
  Administered 2020-07-30: 1 via OROMUCOSAL
  Filled 2020-07-30: qty 177

## 2020-07-30 MED ORDER — CHLORHEXIDINE GLUCONATE CLOTH 2 % EX PADS
6.0000 | MEDICATED_PAD | Freq: Every day | CUTANEOUS | Status: DC
  Administered 2020-07-30 – 2020-08-05 (×6): 6 via TOPICAL

## 2020-07-30 NOTE — Progress Notes (Signed)
Oxygen removed- Patient tolerates well.  Maintains 02 saturation at 96% RA

## 2020-07-30 NOTE — Progress Notes (Signed)
The patient was assisted up to the bedside commode and tolerated well. New dressing applied to surgical site.  Staples in place.  Patient has not passed gas but abd can be heard growling.

## 2020-07-30 NOTE — Progress Notes (Signed)
1 Day Post-Op  Subjective: CC: Patient reports soreness around her incisions. She has not gotten oob. Foley out this morning and she has voided. She has NGT in place and denies nausea.   Objective: Vital signs in last 24 hours: Temp:  [97.5 F (36.4 C)-98.9 F (37.2 C)] 97.5 F (36.4 C) (03/18 0901) Pulse Rate:  [72-97] 97 (03/18 0901) Resp:  [11-23] 18 (03/18 0901) BP: (120-160)/(60-108) 141/83 (03/18 0901) SpO2:  [95 %-100 %] 98 % (03/18 0901) Weight:  [113.1 kg] 113.1 kg (03/17 1752) Last BM Date: 07/29/20  Intake/Output from previous day: 03/17 0701 - 03/18 0700 In: 1530 [I.V.:1300; NG/GT:30; IV Piggyback:200] Out: 852 [Urine:675; Drains:27; Blood:150] Intake/Output this shift: Total I/O In: 914.8 [I.V.:914.8] Out: -   PE: Gen:  Alert, NAD, pleasant Card:  RRR Pulm:  CTAB, no W/R/R, effort normal Abd: Protuberant abdomen that is soft, there is tenderness around incision and drain that appears appropriate in post operative setting, +BS. Midline wound with staples in place. No signs of infection or drainage. Drain in subq with SS output. NGT in place.  Ext:  No LE Edema  Psych: A&Ox3  Skin: no rashes noted, warm and dry   Lab Results:  Recent Labs    07/29/20 0809 07/30/20 0341  WBC 11.5* 14.7*  HGB 14.5 13.6  HCT 45.8 42.2  PLT 260 238   BMET Recent Labs    07/29/20 2307 07/30/20 0341  NA 136 136  K 4.1 4.1  CL 100 98  CO2 28 29  GLUCOSE 164* 170*  BUN 14 13  CREATININE 0.86 0.83  CALCIUM 8.4* 8.7*   PT/INR No results for input(s): LABPROT, INR in the last 72 hours. CMP     Component Value Date/Time   NA 136 07/30/2020 0341   K 4.1 07/30/2020 0341   CL 98 07/30/2020 0341   CO2 29 07/30/2020 0341   GLUCOSE 170 (H) 07/30/2020 0341   BUN 13 07/30/2020 0341   CREATININE 0.83 07/30/2020 0341   CALCIUM 8.7 (L) 07/30/2020 0341   PROT 7.4 07/29/2020 0809   ALBUMIN 3.9 07/29/2020 0809   AST 18 07/29/2020 0809   ALT 19 07/29/2020 0809    ALKPHOS 73 07/29/2020 0809   BILITOT 0.8 07/29/2020 0809   GFRNONAA >60 07/30/2020 0341   GFRAA >60 04/05/2018 1031   Lipase     Component Value Date/Time   LIPASE 27 07/29/2020 0809       Studies/Results: CT Abdomen Pelvis W Contrast  Result Date: 07/29/2020 CLINICAL DATA:  Generalized abdominal pain, nausea, vomiting EXAM: CT ABDOMEN AND PELVIS WITH CONTRAST TECHNIQUE: Multidetector CT imaging of the abdomen and pelvis was performed using the standard protocol following bolus administration of intravenous contrast. CONTRAST:  OMNIPAQUE IOHEXOL 300 MG/ML  SOLN COMPARISON:  10/12/2015 FINDINGS: Lower chest: Lung bases are clear. No effusions. Heart is normal size. Hepatobiliary: Diffuse low-density throughout the liver compatible with fatty infiltration. No focal abnormality. Gallbladder mildly distended. No biliary ductal dilatation. Pancreas: No focal abnormality or ductal dilatation. Spleen: No focal abnormality.  Normal size. Adrenals/Urinary Tract: No adrenal abnormality. No focal renal abnormality. No stones or hydronephrosis. Urinary bladder is unremarkable. Stomach/Bowel: Descending colonic and sigmoid diverticulosis. No active diverticulitis. Bilateral lower abdominal wall ventral hernias noted. The right lower abdominal wall hernia contains multiple small bowel loops. There is evidence of small-bowel obstruction. Distal small bowel decompressed. Small bowel proximal to the hernia dilated and fluid-filled. Vascular/Lymphatic: No evidence of aneurysm or adenopathy. Reproductive: Prior  hysterectomy.  No adnexal masses. Other: No free air. Small amount of fluid within the left lower abdominal wall ventral hernia. Musculoskeletal: No acute bony abnormality. IMPRESSION: Moderate-sized right lower abdominal wall ventral hernia contains small bowel loops with resulting small bowel obstruction. Left lower abdominal wall ventral hernia contains fluid. No bowel within this hernia. Hepatic  steatosis. Electronically Signed   By: Charlett Nose M.D.   On: 07/29/2020 09:44   DG Abd Portable 1V  Result Date: 07/29/2020 CLINICAL DATA:  NG tube placement.  Post abdominal surgery. EXAM: PORTABLE ABDOMEN - 1 VIEW COMPARISON:  CT earlier today. FINDINGS: Tip and side port of the enteric tube below the diaphragm in the stomach. Few prominent loops of small bowel persist in the left abdomen. Stool in the right colon. Skin staples to the left of midline overlie the lower abdomen. IMPRESSION: Tip and side port of the enteric tube below the diaphragm in the stomach. Electronically Signed   By: Narda Rutherford M.D.   On: 07/29/2020 22:03    Anti-infectives: Anti-infectives (From admission, onward)   Start     Dose/Rate Route Frequency Ordered Stop   07/29/20 1600  cefoTEtan (CEFOTAN) 2 g in sodium chloride 0.9 % 100 mL IVPB        2 g 200 mL/hr over 30 Minutes Intravenous On call to O.R. 07/29/20 1355 07/29/20 2038       Assessment/Plan Hx Heart Murmur - appreicate TR[facility] assistance. They have ordered an Echo Hx Hypertension - appreciate [facility] assistance  Pre-diabetes - A1c 6.2. This is stable from labs 1 and 3 years ago. Appreciate [facility] assistance. She does have a PCP  SBO with Incarcerated Incisional Hernia S/p exploratory laparotomy, repair of incarcerated incisional hernia with mesh, placement of drain in subq - Dr. Janee Morn - 07/29/2020 - POD #1 - Intra-op findings: Incarcerated incisional hernia x2, right lower quadrant containing small bowel which was viable, left lower quadrant containing fat. - Subq drain in place. Maintain for now. May be able to come out before d/c  - Midline wound with staples, c/d/i - NGT in place till patient has ROBF - Foley d/c'd this am. Voiding - Mobilize, OOB. PT consult  - Pulm toilet  FEN - NPO, NGT, IVF VTE - SCDs, Loveonx ID - Cefotetan periop Foley - None currently. Voiding.  Follow-Up - Dr. Janee Morn   LOS: 1 day    Jacinto Halim ,  [facility] Surgery 07/30/2020, 10:40 AM Please see Amion for pager number during day hours 7:00am-4:30pm

## 2020-07-30 NOTE — Progress Notes (Signed)
Patient is sitting up in the recliner at the bedside.  Co having nausea and surgical pain.  Medicated as ordered.  Patient denies passing any flatus at this time

## 2020-07-30 NOTE — Progress Notes (Signed)
Physical Therapy is at the bedside.  Patient up to the bedside commode.

## 2020-07-30 NOTE — Progress Notes (Signed)
Consult NOTE-I will follow tomorrow but probably sign off if she is stable for discharge from surgery perspective   Kaitlyn Bray  ZOX:[id number] DOB: Aug 18, 1965 DOA: 07/29/2020 PCP: Lorre Munroe, NP  Brief Narrative:  55 year old white female HTN iron deficiency anemia Right TAH/BSO adhesiolysis appendectomy 2010--also had rectosigmoid adnexal mass removed at the time Readmit 3/17 with known hernia Found to have moderate size lower abdominal ventral wall hernia with bowel obstruction--nonreducible hernia Taken to surgery 3/17 Hospitalist consulted because of murmur evaluated on admission  Hospital-Problem based course  Heart murmur Echocardiogram as below-mild aortic sclerosis no stenosis no mitral valve stenosis Outpatient follow-up if needed good blood pressure control A1c 6.2, prediabetes in my opinion Metabolic syndrome X Outpatient intensive counseling with NP Considering Metformin XR (ordered) 500 daily until follows up as will be salutary 3 for weight loss--she is undecided and will probably need to follow-up with her nurse practitioner in the outpatient setting Could also use SGL PT inhibitor as an outpatient if can afford Prior adnexal mass 2010 with recent surgery Dr. Carolynne Edouard Defer to general surgery  DVT prophylaxis: Per general surgery Code Status: Full presumed Family Communication: None Disposition:  Status is: Inpatient  Remains inpatient appropriate because:Defer to general surgery planning  Procedures:  IMPRESSIONS    1. Left ventricular ejection fraction, by estimation, is 60 to 65%. The  left ventricle has normal function. Left ventricular endocardial border  not optimally defined to evaluate regional wall motion. Left ventricular  diastolic parameters were normal.  2. Right ventricular systolic function is normal. The right ventricular  size is mildly enlarged. Tricuspid regurgitation signal is inadequate for  assessing PA pressure.  3. The mitral  valve was not well visualized. No evidence of mitral valve  regurgitation.  4. The aortic valve was not well visualized. Aortic valve regurgitation  is not visualized. Mild aortic valve sclerosis is present, with no  evidence of aortic valve stenosis.  5. The inferior vena cava is normal in size with greater than 50%  respiratory variability, suggesting right atrial pressure of 3 mmHg.     Subjective: Coherent awake no distress has NG tube in place seems uncomfortable No chest pain Not passing gas yet  Objective: Vitals:   07/29/20 2300 07/30/20 0413 07/30/20 0901 07/30/20 1545  BP: (!) 142/84 (!) 144/88 (!) 141/83 (!) 145/78  Pulse: 72 78 97 79  Resp: 15 15 18 18   Temp: 98.6 F (37 C) 98.6 F (37 C) (!) 97.5 F (36.4 C) 97.8 F (36.6 C)  TempSrc: Oral Oral Oral Oral  SpO2: 100% 98% 98% 99%  Weight:      Height:        Intake/Output Summary (Last 24 hours) at 07/30/2020 1657 Last data filed at 07/30/2020 1007 Gross per 24 hour  Intake 2444.78 ml  Output 852 ml  Net 1592.78 ml   Filed Weights   07/29/20 1752  Weight: 113.1 kg    Examination:  Obese pleasant mild strabismus EOMI NCAT otherwise no icterus no pallor Mallampati 4 Abdomen soft did not examine wounds No lower extremity edema S1-S2 slight murmur at right upper sternal edge accentuated by handgrip No lower extremity edema  Data Reviewed: personally reviewed   CBC    Component Value Date/Time   WBC 14.7 (H) 07/30/2020 0341   RBC 4.84 07/30/2020 0341   HGB 13.6 07/30/2020 0341   HCT 42.2 07/30/2020 0341   PLT 238 07/30/2020 0341   MCV 87.2 07/30/2020 0341   MCH 28.1  07/30/2020 0341   MCHC 32.2 07/30/2020 0341   RDW 13.7 07/30/2020 0341   LYMPHSABS 2.1 03/03/2013 1216   MONOABS 0.4 03/03/2013 1216   EOSABS 0.2 03/03/2013 1216   BASOSABS 0.1 03/03/2013 1216   CMP Latest Ref Rng & Units 07/30/2020 07/29/2020 07/29/2020  Glucose 70 - 99 mg/dL 725(D170(H) 664(Q164(H) 034(V131(H)  BUN 6 - 20 mg/dL 13 14 11    Creatinine 0.44 - 1.00 mg/dL 4.250.83 9.560.86 3.870.76  Sodium 135 - 145 mmol/L 136 136 138  Potassium 3.5 - 5.1 mmol/L 4.1 4.1 4.1  Chloride 98 - 111 mmol/L 98 100 97(L)  CO2 22 - 32 mmol/L 29 28 32  Calcium 8.9 - 10.3 mg/dL 5.6(E8.7(L) 3.3(I8.4(L) 9.6  Total Protein 6.5 - 8.1 g/dL - - 7.4  Total Bilirubin 0.3 - 1.2 mg/dL - - 0.8  Alkaline Phos 38 - 126 U/L - - 73  AST 15 - 41 U/L - - 18  ALT 0 - 44 U/L - - 19     Radiology Studies: CT Abdomen Pelvis W Contrast  Result Date: 07/29/2020 CLINICAL DATA:  Generalized abdominal pain, nausea, vomiting EXAM: CT ABDOMEN AND PELVIS WITH CONTRAST TECHNIQUE: Multidetector CT imaging of the abdomen and pelvis was performed using the standard protocol following bolus administration of intravenous contrast. CONTRAST:  100mL OMNIPAQUE IOHEXOL 300 MG/ML  SOLN COMPARISON:  10/12/2015 FINDINGS: Lower chest: Lung bases are clear. No effusions. Heart is normal size. Hepatobiliary: Diffuse low-density throughout the liver compatible with fatty infiltration. No focal abnormality. Gallbladder mildly distended. No biliary ductal dilatation. Pancreas: No focal abnormality or ductal dilatation. Spleen: No focal abnormality.  Normal size. Adrenals/Urinary Tract: No adrenal abnormality. No focal renal abnormality. No stones or hydronephrosis. Urinary bladder is unremarkable. Stomach/Bowel: Descending colonic and sigmoid diverticulosis. No active diverticulitis. Bilateral lower abdominal wall ventral hernias noted. The right lower abdominal wall hernia contains multiple small bowel loops. There is evidence of small-bowel obstruction. Distal small bowel decompressed. Small bowel proximal to the hernia dilated and fluid-filled. Vascular/Lymphatic: No evidence of aneurysm or adenopathy. Reproductive: Prior hysterectomy.  No adnexal masses. Other: No free air. Small amount of fluid within the left lower abdominal wall ventral hernia. Musculoskeletal: No acute bony abnormality. IMPRESSION:  Moderate-sized right lower abdominal wall ventral hernia contains small bowel loops with resulting small bowel obstruction. Left lower abdominal wall ventral hernia contains fluid. No bowel within this hernia. Hepatic steatosis. Electronically Signed   By: Charlett NoseKevin  Dover M.D.   On: 07/29/2020 09:44   DG Abd Portable 1V  Result Date: 07/29/2020 CLINICAL DATA:  NG tube placement.  Post abdominal surgery. EXAM: PORTABLE ABDOMEN - 1 VIEW COMPARISON:  CT earlier today. FINDINGS: Tip and side port of the enteric tube below the diaphragm in the stomach. Few prominent loops of small bowel persist in the left abdomen. Stool in the right colon. Skin staples to the left of midline overlie the lower abdomen. IMPRESSION: Tip and side port of the enteric tube below the diaphragm in the stomach. Electronically Signed   By: Narda RutherfordMelanie  Sanford M.D.   On: 07/29/2020 22:03   ECHOCARDIOGRAM COMPLETE  Result Date: 07/30/2020    ECHOCARDIOGRAM REPORT   Patient Name:   Kaitlyn Bray Date of Exam: 07/30/2020 Medical Rec #:  [id number]       Height:       65.0 in Accession #:    [id number]      Weight:       249.3 lb Date of Birth:  12/30/1965  BSA:          2.172 m Patient Age:    54 years        BP:           144/88 mmHg Patient Gender: F               HR:           65 bpm. Exam Location:  Inpatient Procedure: 2D Echo, 3D Echo, Cardiac Doppler, Color Doppler and Strain Analysis                                MODIFIED REPORT:   This report was modified by Weston Brass MD on 07/30/2020 due to quality                                    comments.  Indications:     Murmur  History:         Patient has no prior history of Echocardiogram examinations.                  CHF; Risk Factors:Diabetes.  Sonographer:     Neomia Dear RDCS Referring Phys:  [id number] Laurell Josephs THOMPSON Diagnosing Phys: Weston Brass MD  Sonographer Comments: Technically difficult study due to poor echo windows. IMPRESSIONS  1. Left ventricular ejection fraction, by  estimation, is 60 to 65%. The left ventricle has normal function. Left ventricular endocardial border not optimally defined to evaluate regional wall motion. Left ventricular diastolic parameters were normal.  2. Right ventricular systolic function is normal. The right ventricular size is mildly enlarged. Tricuspid regurgitation signal is inadequate for assessing PA pressure.  3. The mitral valve was not well visualized. No evidence of mitral valve regurgitation.  4. The aortic valve was not well visualized. Aortic valve regurgitation is not visualized. Mild aortic valve sclerosis is present, with no evidence of aortic valve stenosis.  5. The inferior vena cava is normal in size with greater than 50% respiratory variability, suggesting right atrial pressure of 3 mmHg. FINDINGS  Left Ventricle: Left ventricular ejection fraction, by estimation, is 60 to 65%. The left ventricle has normal function. Left ventricular endocardial border not optimally defined to evaluate regional wall motion. Global longitudinal strain performed but  not reported based on interpreter judgement due to suboptimal tracking. 3D left ventricular ejection fraction analysis performed but not reported based on interpreter judgement due to suboptimal quality. The left ventricular internal cavity size was normal in size. There is no left ventricular hypertrophy. Left ventricular diastolic parameters were normal. Right Ventricle: The right ventricular size is mildly enlarged. Right vetricular wall thickness was not well visualized. Right ventricular systolic function is normal. Tricuspid regurgitation signal is inadequate for assessing PA pressure. Left Atrium: Left atrial size was normal in size. Right Atrium: Right atrial size was normal in size. Pericardium: There is no evidence of pericardial effusion. Mitral Valve: The mitral valve was not well visualized. No evidence of mitral valve regurgitation. Tricuspid Valve: The tricuspid valve is not  well visualized. Tricuspid valve regurgitation is trivial. Aortic Valve: The aortic valve was not well visualized. Aortic valve regurgitation is not visualized. Mild aortic valve sclerosis is present, with no evidence of aortic valve stenosis. Aortic valve mean gradient measures 8.0 mmHg. Aortic valve peak gradient measures 15.8 mmHg. Aortic valve area, by VTI measures 1.76 cm. Pulmonic  Valve: The pulmonic valve was not well visualized. Pulmonic valve regurgitation is not visualized. Aorta: The aortic root is normal in size and structure. Venous: The inferior vena cava was not well visualized. The inferior vena cava is normal in size with greater than 50% respiratory variability, suggesting right atrial pressure of 3 mmHg. IAS/Shunts: The interatrial septum was not well visualized.  LEFT VENTRICLE PLAX 2D LVIDd:         4.30 cm  Diastology LVIDs:         2.40 cm  LV e' medial:    6.96 cm/s LV PW:         0.90 cm  LV E/e' medial:  12.9 LV IVS:        1.10 cm  LV e' lateral:   10.60 cm/s LVOT diam:     1.60 cm  LV E/e' lateral: 8.5 LV SV:         67 LV SV Index:   31 LVOT Area:     2.01 cm  RIGHT VENTRICLE RV S prime:     13.80 cm/s  PULMONARY VEINS TAPSE (M-mode): 1.9 cm      A Reversal Duration: 135.00 msec                             A Reversal Velocity: 36.00 cm/s                             Diastolic Velocity:  34.60 cm/s                             S/D Velocity:        1.50                             Systolic Velocity:   52.80 cm/s LEFT ATRIUM             Index       RIGHT ATRIUM           Index LA diam:        3.40 cm 1.57 cm/m  RA Area:     14.70 cm LA Vol (A2C):   37.7 ml 17.35 ml/m RA Volume:   32.10 ml  14.78 ml/m LA Vol (A4C):   49.9 ml 22.97 ml/m LA Biplane Vol: 45.2 ml 20.81 ml/m  AORTIC VALVE AV Area (Vmax):    1.63 cm AV Area (Vmean):   1.55 cm AV Area (VTI):     1.76 cm AV Vmax:           199.00 cm/s AV Vmean:          136.000 cm/s AV VTI:            0.383 m AV Peak Grad:      15.8 mmHg AV  Mean Grad:      8.0 mmHg LVOT Vmax:         161.00 cm/s LVOT Vmean:        105.000 cm/s LVOT VTI:          0.335 m LVOT/AV VTI ratio: 0.87  AORTA Ao Root diam: 2.40 cm Ao Asc diam:  2.70 cm MITRAL VALVE MV Area (PHT): 2.99 cm    SHUNTS MV Decel Time: 254 msec    Systemic VTI:  0.34 m MV  E velocity: 90.00 cm/s  Systemic Diam: 1.60 cm MV A velocity: 93.00 cm/s MV E/A ratio:  0.97 Weston Brass MD Electronically signed by Weston Brass MD Signature Date/Time: 07/30/2020/4:55:47 PM    Final (Updated)      Scheduled Meds: . Chlorhexidine Gluconate Cloth  6 each Topical Daily  . Chlorhexidine Gluconate Cloth  6 each Topical Daily  . metoprolol tartrate  2.5 mg Intravenous Q12H   Continuous Infusions: . dextrose 5 % and 0.9 % NaCl with KCl 20 mEq/L 75 mL/hr at 07/30/20 1409  . methocarbamol (ROBAXIN) IV 500 mg (07/30/20 1413)     LOS: 1 day   Time spent: 20  Rhetta Mura, MD [facility] To contact the attending provider between 7A-7P or the covering provider during after hours 7P-7A, please log into the web site [email or web address] and access using universal [place] password for that web site. If you do not have the password, please call the hospital operator.  07/30/2020, 4:57 PM

## 2020-07-30 NOTE — Evaluation (Signed)
Physical Therapy Evaluation Patient Details Name: Kaitlyn Bray MRN: [id number] DOB: Aug 31, 1965 Today's Date: 07/30/2020   History of Present Illness  Malanie is a 55 y/o female who presented to ED with abdominal pain, nausea, vomiting, reports of inability to eat, and constipation. Pt was admitted on 07/29/20 and diagnosed with incarcerated hiatal hernia. S/p exploratory laparotomy on 07/29/20. PMh inclused HTN, appendectomy, hysterectomy, asthma, and COVID (Around Jan 2022).    Clinical Impression  Pt received in bed, cooperative. Generally min-mod A for steadying. Most assistance needed with bed mobility. Able to complete stand pivot to [facility] and then to chair. Per discussion with RN, would prefer to that NG tube stays connected to suction, unable to attempt ambulation today. Suspect mobility will improve as pain level improves post surgery. Pt would benefit from PT to address strength, balance, and increase independency. Left in chair with all needs met, call bell within reach, chair alarm active, and RN aware of status. Will continue to follow acutely.    Follow Up Recommendations Home health PT;Supervision for mobility/OOB;Other (comment) (Limited by insurance situation, maximize PT during admission)    Equipment Recommendations  Rolling walker with 5" wheels;3in1 (PT)    Recommendations for Other Services       Precautions / Restrictions Precautions Precautions: Fall Precaution Comments: NG tube, abdominal incision, RLQ drain Restrictions Weight Bearing Restrictions: No      Mobility  Bed Mobility Overal bed mobility: Needs Assistance Bed Mobility: Rolling;Sidelying to Sit Rolling: Min guard Sidelying to sit: Mod assist       General bed mobility comments: Min guard for safety, no physical assist given, use of bedrail, mod A to bring trunk upright from sidelying    Transfers Overall transfer level: Needs assistance Equipment used: Rolling walker (2 wheeled) Transfers: Sit  to/from [facility] Sit to Stand: Min assist Stand pivot transfers: Min assist       General transfer comment: Min A to stand and for steadying during transfer  Ambulation/Gait             General Gait Details: unable, connected to NG tube on wall  Stairs            Wheelchair Mobility    Modified Rankin (Stroke Patients Only)       Balance Overall balance assessment: Needs assistance   Sitting balance-Leahy Scale: Fair       Standing balance-Leahy Scale: Poor Standing balance comment: reliant on UE support, likely due to pain                             Pertinent Vitals/Pain Pain Assessment: Faces Faces Pain Scale: Hurts whole lot Pain Location: abdominals, incision Pain Descriptors / Indicators: Constant;Throbbing;Aching Pain Intervention(s): Monitored during session;Repositioned    Home Living Family/patient expects to be discharged to:: Private residence Living Arrangements: Spouse/significant other Available Help at Discharge: Available 24 hours/day Type of Home: House Home Access: Ramped entrance     Home Layout: One level Home Equipment: Environmental consultant - 4 wheels;Other (comment);Cane - single point (rollator)      Prior Function Level of Independence: Independent               Hand Dominance        Extremity/Trunk Assessment   Upper Extremity Assessment Upper Extremity Assessment: Overall WFL for tasks assessed    Lower Extremity Assessment Lower Extremity Assessment: Overall WFL for tasks assessed    Cervical / Trunk Assessment  Cervical / Trunk Assessment: Kyphotic  Communication   Communication: No difficulties  Cognition Arousal/Alertness: Awake/alert Behavior During Therapy: WFL for tasks assessed/performed Overall Cognitive Status: Within Functional Limits for tasks assessed                                        General Comments      Exercises     Assessment/Plan     PT Assessment Patient needs continued PT services  PT Problem List Decreased strength;Decreased knowledge of use of DME;Decreased activity tolerance;Decreased safety awareness;Decreased balance;Decreased mobility;Decreased coordination       PT Treatment Interventions DME instruction;Balance training;Gait training;Neuromuscular re-education;Functional mobility training;Patient/family education;Therapeutic activities;Therapeutic exercise    PT Goals (Current goals can be found in the Care Plan section)  Acute Rehab PT Goals Patient Stated Goal: less pain PT Goal Formulation: With patient Time For Goal Achievement: 08/13/20 Potential to Achieve Goals: Good    Frequency Min 5X/week   Barriers to discharge        Co-evaluation               AM-PAC PT "6 Clicks" Mobility  Outcome Measure Help needed turning from your back to your side while in a flat bed without using bedrails?: A Little Help needed moving from lying on your back to sitting on the side of a flat bed without using bedrails?: A Lot Help needed moving to and from a bed to a chair (including a wheelchair)?: A Little Help needed standing up from a chair using your arms (e.g., wheelchair or bedside chair)?: A Little Help needed to walk in hospital room?: A Little Help needed climbing 3-5 steps with a railing? : A Lot 6 Click Score: 16    End of Session Equipment Utilized During Treatment: Gait belt;Oxygen Activity Tolerance: Patient tolerated treatment well Patient left: in chair;with call bell/phone within reach;with chair alarm set Nurse Communication: Mobility status PT Visit Diagnosis: Unsteadiness on feet (R26.81);Pain    Time:  -      Charges:         Rosita Kea, SPT

## 2020-07-30 NOTE — Plan of Care (Signed)

## 2020-07-31 MED ORDER — ENOXAPARIN SODIUM 40 MG/0.4ML ~~LOC~~ SOLN
40.0000 mg | SUBCUTANEOUS | Status: DC
  Administered 2020-07-31 – 2020-08-04 (×5): 40 mg via SUBCUTANEOUS
  Filled 2020-07-31 (×6): qty 0.4

## 2020-07-31 NOTE — Progress Notes (Signed)
Consult NOTE-I will follow until taking po and closer to d/c--we appreciate the consult-thanks  Kaitlyn Bray  UVO:[id number] DOB: 1965/06/12 DOA: 07/29/2020 PCP: Lorre Munroe, NP  Brief Narrative:  55 year old white female HTN iron deficiency anemia Right TAH/BSO adhesiolysis appendectomy 2010--also had rectosigmoid adnexal mass removed at the time Readmit 3/17 with known hernia Found to have moderate size lower abdominal ventral wall hernia with bowel obstruction--nonreducible hernia Taken to surgery 3/17 Hospitalist consulted because of murmur evaluated on admission  Hospital-Problem based course  Heart murmur Echocardiogram as below-mild aortic sclerosis no stenosis no mitral valve stenosis Outpatient follow-up if needed  good blood pressure control tantamount to prevention of development of ASCVD I have educated her to some degree A1c 6.2, prediabetes in my opinion Metabolic syndrome X Outpatient intensive counseling with NP Considering Metformin XR (ordered) 500 daily--patient is motivated to try to lose weight and has an appointment in several weeks with her NP Could also use /Metformin/Saxenda as an outpatient if can afford Prior adnexal mass 2010 with recent incarcerated hernia surgery Dr. Carolynne Edouard Defer to general surgery pain meds etc. May need outpatient GYN referral on discharge depending on the images Mild hypokalemia  Obtain Chem-12 a.m. + CBC in a.m.  Continuing D5 plus K until the labs  DVT prophylaxis: Per general surgery Code Status: Full presumed Family Communication: None Disposition:  Status is: Inpatient  Remains inpatient appropriate because:Defer to general surgery planning  Procedures:  IMPRESSIONS    1. Left ventricular ejection fraction, by estimation, is 60 to 65%. The  left ventricle has normal function. Left ventricular endocardial border  not optimally defined to evaluate regional wall motion. Left ventricular  diastolic parameters were  normal.  2. Right ventricular systolic function is normal. The right ventricular  size is mildly enlarged. Tricuspid regurgitation signal is inadequate for  assessing PA pressure.  3. The mitral valve was not well visualized. No evidence of mitral valve  regurgitation.  4. The aortic valve was not well visualized. Aortic valve regurgitation  is not visualized. Mild aortic valve sclerosis is present, with no  evidence of aortic valve stenosis.  5. The inferior vena cava is normal in size with greater than 50%  respiratory variability, suggesting right atrial pressure of 3 mmHg.     Subjective:  Only flatus no stool yet Pain is better controlled however I examined her abdomen she is tympanometric but I did not remove the bandages as was examined this morning by general surgery  Objective: Vitals:   07/31/20 0440 07/31/20 0844 07/31/20 0846 07/31/20 0900  BP: 130/76 (!) 150/81  139/66  Pulse: 77 93 87 71  Resp: 17 18  18   Temp: 97.6 F (36.4 C) 97.8 F (36.6 C)  98.7 F (37.1 C)  TempSrc: Axillary Oral  Oral  SpO2: 100% 92%  94%  Weight:      Height:        Intake/Output Summary (Last 24 hours) at 07/31/2020 1434 Last data filed at 07/31/2020 0744 Gross per 24 hour  Intake 1704.66 ml  Output 270 ml  Net 1434.66 ml   Filed Weights   07/29/20 1752  Weight: 113.1 kg    Examination:  Obese pleasant mild strabismus  no icterus no pallor Abdomen soft bandage in place No lower extremity edema S1-S2 slight murmur at right upper sternal edge accentuated by handgrip No lower extremity edema  Data Reviewed: personally reviewed   CBC    Component Value Date/Time   WBC 14.7 (H) 07/30/2020  0341   RBC 4.84 07/30/2020 0341   HGB 13.6 07/30/2020 0341   HCT 42.2 07/30/2020 0341   PLT 238 07/30/2020 0341   MCV 87.2 07/30/2020 0341   MCH 28.1 07/30/2020 0341   MCHC 32.2 07/30/2020 0341   RDW 13.7 07/30/2020 0341   LYMPHSABS 2.1 03/03/2013 1216   MONOABS 0.4  03/03/2013 1216   EOSABS 0.2 03/03/2013 1216   BASOSABS 0.1 03/03/2013 1216   CMP Latest Ref Rng & Units 07/30/2020 07/29/2020 07/29/2020  Glucose 70 - 99 mg/dL 630(Z) 601(U) 932(T)  BUN 6 - 20 mg/dL 13 14 11   Creatinine 0.44 - 1.00 mg/dL 5.57 3.22  Sodium 135 - 145 mmol/L 136 136 138  Potassium 3.5 - 5.1 mmol/L 4.1 4.1 4.1  Chloride 98 - 111 mmol/L 98 100 97(L)  CO2 22 - 32 mmol/L 29 28 32  Calcium 8.9 - 10.3 mg/dL 0.25) 4.2(H) 9.6  Total Protein 6.5 - 8.1 g/dL - - 7.4  Total Bilirubin 0.3 - 1.2 mg/dL - - 0.8  Alkaline Phos 38 - 126 U/L - - 73  AST 15 - 41 U/L - - 18  ALT 0 - 44 U/L - - 19     Radiology Studies: DG Abd Portable 1V  Result Date: 07/29/2020 CLINICAL DATA:  NG tube placement.  Post abdominal surgery. EXAM: PORTABLE ABDOMEN - 1 VIEW COMPARISON:  CT earlier today. FINDINGS: Tip and side port of the enteric tube below the diaphragm in the stomach. Few prominent loops of small bowel persist in the left abdomen. Stool in the right colon. Skin staples to the left of midline overlie the lower abdomen. IMPRESSION: Tip and side port of the enteric tube below the diaphragm in the stomach. Electronically Signed   By: 07/31/2020 M.D.   On: 07/29/2020 22:03   ECHOCARDIOGRAM COMPLETE  Result Date: 07/30/2020    ECHOCARDIOGRAM REPORT   Patient Name:   Kaitlyn Bray Date of Exam: 07/30/2020 Medical Rec #:  08/01/2020       Height:       65.0 in Accession #:    [id number]      Weight:       249.3 lb Date of Birth:  February 15, 1966        BSA:          2.172 m Patient Age:    54 years        BP:           144/88 mmHg Patient Gender: F               HR:           65 bpm. Exam Location:  Inpatient Procedure: 2D Echo, 3D Echo, Cardiac Doppler, Color Doppler and Strain Analysis                                MODIFIED REPORT:   This report was modified by 01/[birth year]1966 MD on 07/30/2020 due to quality                                    comments.  Indications:     Murmur  History:          Patient has no prior history of Echocardiogram examinations.  CHF; Risk Factors:Diabetes.  Sonographer:     Neomia Dear RDCS Referring Phys:  [id number] Laurell Josephs THOMPSON Diagnosing Phys: Weston Brass MD  Sonographer Comments: Technically difficult study due to poor echo windows. IMPRESSIONS  1. Left ventricular ejection fraction, by estimation, is 60 to 65%. The left ventricle has normal function. Left ventricular endocardial border not optimally defined to evaluate regional wall motion. Left ventricular diastolic parameters were normal.  2. Right ventricular systolic function is normal. The right ventricular size is mildly enlarged. Tricuspid regurgitation signal is inadequate for assessing PA pressure.  3. The mitral valve was not well visualized. No evidence of mitral valve regurgitation.  4. The aortic valve was not well visualized. Aortic valve regurgitation is not visualized. Mild aortic valve sclerosis is present, with no evidence of aortic valve stenosis.  5. The inferior vena cava is normal in size with greater than 50% respiratory variability, suggesting right atrial pressure of 3 mmHg. FINDINGS  Left Ventricle: Left ventricular ejection fraction, by estimation, is 60 to 65%. The left ventricle has normal function. Left ventricular endocardial border not optimally defined to evaluate regional wall motion. Global longitudinal strain performed but  not reported based on interpreter judgement due to suboptimal tracking. 3D left ventricular ejection fraction analysis performed but not reported based on interpreter judgement due to suboptimal quality. The left ventricular internal cavity size was normal in size. There is no left ventricular hypertrophy. Left ventricular diastolic parameters were normal. Right Ventricle: The right ventricular size is mildly enlarged. Right vetricular wall thickness was not well visualized. Right ventricular systolic function is normal. Tricuspid regurgitation signal  is inadequate for assessing PA pressure. Left Atrium: Left atrial size was normal in size. Right Atrium: Right atrial size was normal in size. Pericardium: There is no evidence of pericardial effusion. Mitral Valve: The mitral valve was not well visualized. No evidence of mitral valve regurgitation. Tricuspid Valve: The tricuspid valve is not well visualized. Tricuspid valve regurgitation is trivial. Aortic Valve: The aortic valve was not well visualized. Aortic valve regurgitation is not visualized. Mild aortic valve sclerosis is present, with no evidence of aortic valve stenosis. Aortic valve mean gradient measures 8.0 mmHg. Aortic valve peak gradient measures 15.8 mmHg. Aortic valve area, by VTI measures 1.76 cm. Pulmonic Valve: The pulmonic valve was not well visualized. Pulmonic valve regurgitation is not visualized. Aorta: The aortic root is normal in size and structure. Venous: The inferior vena cava was not well visualized. The inferior vena cava is normal in size with greater than 50% respiratory variability, suggesting right atrial pressure of 3 mmHg. IAS/Shunts: The interatrial septum was not well visualized.  LEFT VENTRICLE PLAX 2D LVIDd:         4.30 cm  Diastology LVIDs:         2.40 cm  LV e' medial:    6.96 cm/s LV PW:         0.90 cm  LV E/e' medial:  12.9 LV IVS:        1.10 cm  LV e' lateral:   10.60 cm/s LVOT diam:     1.60 cm  LV E/e' lateral: 8.5 LV SV:         67 LV SV Index:   31 LVOT Area:     2.01 cm  RIGHT VENTRICLE RV S prime:     13.80 cm/s  PULMONARY VEINS TAPSE (M-mode): 1.9 cm      A Reversal Duration: 135.00 msec  A Reversal Velocity: 36.00 cm/s                             Diastolic Velocity:  34.60 cm/s                             S/D Velocity:        1.50                             Systolic Velocity:   52.80 cm/s LEFT ATRIUM             Index       RIGHT ATRIUM           Index LA diam:        3.40 cm 1.57 cm/m  RA Area:     14.70 cm LA Vol (A2C):    37.7 ml 17.35 ml/m RA Volume:   32.10 ml  14.78 ml/m LA Vol (A4C):   49.9 ml 22.97 ml/m LA Biplane Vol: 45.2 ml 20.81 ml/m  AORTIC VALVE AV Area (Vmax):    1.63 cm AV Area (Vmean):   1.55 cm AV Area (VTI):     1.76 cm AV Vmax:           199.00 cm/s AV Vmean:          136.000 cm/s AV VTI:            0.383 m AV Peak Grad:      15.8 mmHg AV Mean Grad:      8.0 mmHg LVOT Vmax:         161.00 cm/s LVOT Vmean:        105.000 cm/s LVOT VTI:          0.335 m LVOT/AV VTI ratio: 0.87  AORTA Ao Root diam: 2.40 cm Ao Asc diam:  2.70 cm MITRAL VALVE MV Area (PHT): 2.99 cm    SHUNTS MV Decel Time: 254 msec    Systemic VTI:  0.34 m MV E velocity: 90.00 cm/s  Systemic Diam: 1.60 cm MV A velocity: 93.00 cm/s MV E/A ratio:  0.97 Weston BrassGayatri Acharya MD Electronically signed by Weston BrassGayatri Acharya MD Signature Date/Time: 07/30/2020/4:55:47 PM    Final (Updated)      Scheduled Meds: . Chlorhexidine Gluconate Cloth  6 each Topical Daily  . Chlorhexidine Gluconate Cloth  6 each Topical Daily  . enoxaparin (LOVENOX) injection  40 mg Subcutaneous Q24H  . metoprolol tartrate  2.5 mg Intravenous Q12H   Continuous Infusions: . dextrose 5 % and 0.9 % NaCl with KCl 20 mEq/L 75 mL/hr at 07/31/20 0436  . methocarbamol (ROBAXIN) IV 500 mg (07/31/20 0647)     LOS: 2 days   Time spent: 15  Rhetta MuraJai-Gurmukh Suren Payne, MD [facility] To contact the attending provider between 7A-7P or the covering provider during after hours 7P-7A, please log into the web site [email or web address] and access using universal [place] password for that web site. If you do not have the password, please call the hospital operator.  07/31/2020, 2:34 PM

## 2020-07-31 NOTE — Progress Notes (Addendum)
2 Days Post-Op  Subjective: CC: Patient reports that she continues to have soreness that is unchanged from yesterday around her midline incision and the drain. She has had nausea requiring zofran x 3 in the last 24 hours. She denies flatus or bm. Minimal NGT output. I was able to flush this and get it working again. Mobilizing with therapies. Reports her son and husband will be able to help at home after d/c. She is voiding since foley removal.   Objective: Vital signs in last 24 hours: Temp:  [97.6 F (36.4 C)-98 F (36.7 C)] 97.8 F (36.6 C) (03/19 0844) Pulse Rate:  [77-93] 87 (03/19 0846) Resp:  [17-18] 18 (03/19 0844) BP: (123-150)/(75-81) 150/81 (03/19 0844) SpO2:  [92 %-100 %] 92 % (03/19 0844) Last BM Date: 07/29/20  Intake/Output from previous day: 03/18 0701 - 03/19 0700 In: 1567.5 [I.V.:1465.8; IV Piggyback:101.7] Out: 270 [Urine:200; Emesis/NG output:50; Drains:20] Intake/Output this shift: Total I/O In: 1052 [I.V.:952; IV Piggyback:100] Out: -   PE: Gen:  Alert, NAD, pleasant Card:  RRR Pulm:  CTAB, no W/R/R, effort normal Abd: Protuberant abdomen that is soft, there is tenderness around incision and drain that appears appropriate in post operative setting, +BS. Midline wound with staples in place. No signs of infection or drainage. Drain in subq with SS output. NGT in place (~50-100cc output returned after I flushed and got it working again) Ext:  No LE Edema  Psych: A&Ox3  Skin: no rashes noted, warm and dry  Lab Results:  Recent Labs    07/29/20 0809 07/30/20 0341  WBC 11.5* 14.7*  HGB 14.5 13.6  HCT 45.8 42.2  PLT 260 238   BMET Recent Labs    07/29/20 2307 07/30/20 0341  NA 136 136  K 4.1 4.1  CL 100 98  CO2 28 29  GLUCOSE 164* 170*  BUN 14 13  CREATININE 0.86 0.83  CALCIUM 8.4* 8.7*   PT/INR No results for input(s): LABPROT, INR in the last 72 hours. CMP     Component Value Date/Time   NA 136 07/30/2020 0341   K 4.1 07/30/2020  0341   CL 98 07/30/2020 0341   CO2 29 07/30/2020 0341   GLUCOSE 170 (H) 07/30/2020 0341   BUN 13 07/30/2020 0341   CREATININE 0.83 07/30/2020 0341   CALCIUM 8.7 (L) 07/30/2020 0341   PROT 7.4 07/29/2020 0809   ALBUMIN 3.9 07/29/2020 0809   AST 18 07/29/2020 0809   ALT 19 07/29/2020 0809   ALKPHOS 73 07/29/2020 0809   BILITOT 0.8 07/29/2020 0809   GFRNONAA >60 07/30/2020 0341   GFRAA >60 04/05/2018 1031   Lipase     Component Value Date/Time   LIPASE 27 07/29/2020 0809       Studies/Results: DG Abd Portable 1V  Result Date: 07/29/2020 CLINICAL DATA:  NG tube placement.  Post abdominal surgery. EXAM: PORTABLE ABDOMEN - 1 VIEW COMPARISON:  CT earlier today. FINDINGS: Tip and side port of the enteric tube below the diaphragm in the stomach. Few prominent loops of small bowel persist in the left abdomen. Stool in the right colon. Skin staples to the left of midline overlie the lower abdomen. IMPRESSION: Tip and side port of the enteric tube below the diaphragm in the stomach. Electronically Signed   By: Narda RutherfordMelanie  Sanford M.D.   On: 07/29/2020 22:03   ECHOCARDIOGRAM COMPLETE  Result Date: 07/30/2020    ECHOCARDIOGRAM REPORT   Patient Name:   Kaitlyn Bray Date of Exam:  07/30/2020 Medical Rec #:  [id number]       Height:       65.0 in Accession #:    [id number]      Weight:       249.3 lb Date of Birth:  [birth year]        BSA:          2.172 m Patient Age:    55 years        BP:           144/88 mmHg Patient Gender: F               HR:           65 bpm. Exam Location:  Inpatient Procedure: 2D Echo, 3D Echo, Cardiac Doppler, Color Doppler and Strain Analysis                                MODIFIED REPORT:   This report was modified by Weston Brass MD on 07/30/2020 due to quality                                    comments.  Indications:     Murmur  History:         Patient has no prior history of Echocardiogram examinations.                  CHF; Risk Factors:Diabetes.  Sonographer:      Neomia Dear RDCS Referring Phys:  [id number] Laurell Josephs THOMPSON Diagnosing Phys: Weston Brass MD  Sonographer Comments: Technically difficult study due to poor echo windows. IMPRESSIONS  1. Left ventricular ejection fraction, by estimation, is 60 to 65%. The left ventricle has normal function. Left ventricular endocardial border not optimally defined to evaluate regional wall motion. Left ventricular diastolic parameters were normal.  2. Right ventricular systolic function is normal. The right ventricular size is mildly enlarged. Tricuspid regurgitation signal is inadequate for assessing PA pressure.  3. The mitral valve was not well visualized. No evidence of mitral valve regurgitation.  4. The aortic valve was not well visualized. Aortic valve regurgitation is not visualized. Mild aortic valve sclerosis is present, with no evidence of aortic valve stenosis.  5. The inferior vena cava is normal in size with greater than 50% respiratory variability, suggesting right atrial pressure of 3 mmHg. FINDINGS  Left Ventricle: Left ventricular ejection fraction, by estimation, is 60 to 65%. The left ventricle has normal function. Left ventricular endocardial border not optimally defined to evaluate regional wall motion. Global longitudinal strain performed but  not reported based on interpreter judgement due to suboptimal tracking. 3D left ventricular ejection fraction analysis performed but not reported based on interpreter judgement due to suboptimal quality. The left ventricular internal cavity size was normal in size. There is no left ventricular hypertrophy. Left ventricular diastolic parameters were normal. Right Ventricle: The right ventricular size is mildly enlarged. Right vetricular wall thickness was not well visualized. Right ventricular systolic function is normal. Tricuspid regurgitation signal is inadequate for assessing PA pressure. Left Atrium: Left atrial size was normal in size. Right Atrium: Right atrial size  was normal in size. Pericardium: There is no evidence of pericardial effusion. Mitral Valve: The mitral valve was not well visualized. No evidence of mitral valve regurgitation. Tricuspid Valve: The tricuspid valve is not well visualized. Tricuspid valve regurgitation  is trivial. Aortic Valve: The aortic valve was not well visualized. Aortic valve regurgitation is not visualized. Mild aortic valve sclerosis is present, with no evidence of aortic valve stenosis. Aortic valve mean gradient measures 8.0 mmHg. Aortic valve peak gradient measures 15.8 mmHg. Aortic valve area, by VTI measures 1.76 cm. Pulmonic Valve: The pulmonic valve was not well visualized. Pulmonic valve regurgitation is not visualized. Aorta: The aortic root is normal in size and structure. Venous: The inferior vena cava was not well visualized. The inferior vena cava is normal in size with greater than 50% respiratory variability, suggesting right atrial pressure of 3 mmHg. IAS/Shunts: The interatrial septum was not well visualized.  LEFT VENTRICLE PLAX 2D LVIDd:         4.30 cm  Diastology LVIDs:         2.40 cm  LV e' medial:    6.96 cm/s LV PW:         0.90 cm  LV E/e' medial:  12.9 LV IVS:        1.10 cm  LV e' lateral:   10.60 cm/s LVOT diam:     1.60 cm  LV E/e' lateral: 8.5 LV SV:         67 LV SV Index:   31 LVOT Area:     2.01 cm  RIGHT VENTRICLE RV S prime:     13.80 cm/s  PULMONARY VEINS TAPSE (M-mode): 1.9 cm      A Reversal Duration: 135.00 msec                             A Reversal Velocity: 36.00 cm/s                             Diastolic Velocity:  34.60 cm/s                             S/D Velocity:        1.50                             Systolic Velocity:   52.80 cm/s LEFT ATRIUM             Index       RIGHT ATRIUM           Index LA diam:        3.40 cm 1.57 cm/m  RA Area:     14.70 cm LA Vol (A2C):   37.7 ml 17.35 ml/m RA Volume:   32.10 ml  14.78 ml/m LA Vol (A4C):   49.9 ml 22.97 ml/m LA Biplane Vol: 45.2 ml 20.81  ml/m  AORTIC VALVE AV Area (Vmax):    1.63 cm AV Area (Vmean):   1.55 cm AV Area (VTI):     1.76 cm AV Vmax:           199.00 cm/s AV Vmean:          136.000 cm/s AV VTI:            0.383 m AV Peak Grad:      15.8 mmHg AV Mean Grad:      8.0 mmHg LVOT Vmax:         161.00 cm/s LVOT Vmean:        105.000 cm/s LVOT VTI:  0.335 m LVOT/AV VTI ratio: 0.87  AORTA Ao Root diam: 2.40 cm Ao Asc diam:  2.70 cm MITRAL VALVE MV Area (PHT): 2.99 cm    SHUNTS MV Decel Time: 254 msec    Systemic VTI:  0.34 m MV E velocity: 90.00 cm/s  Systemic Diam: 1.60 cm MV A velocity: 93.00 cm/s MV E/A ratio:  0.97 Weston Brass MD Electronically signed by Weston Brass MD Signature Date/Time: 07/30/2020/4:55:47 PM    Final (Updated)     Anti-infectives: Anti-infectives (From admission, onward)   Start     Dose/Rate Route Frequency Ordered Stop   07/29/20 1600  cefoTEtan (CEFOTAN) 2 g in sodium chloride 0.9 % 100 mL IVPB        2 g 200 mL/hr over 30 Minutes Intravenous On call to O.R. 07/29/20 1355 07/29/20 2038       Assessment/Plan Hx Heart Murmur - appreicate TRH assistance.  Echo yesterday with EF 60-65% w Hx Hypertension - appreciate TRH assistance  Pre-diabetes - A1c 6.2. This is stable from labs 1 and 3 years ago. Appreciate TRH assistance. She does have a PCP  SBO with Incarcerated Incisional Hernia S/p exploratory laparotomy, repair of incarcerated incisional hernia with mesh, placement of drain in subq - Dr. Janee Morn - 07/29/2020 - POD #2 - Intra-op findings: Incarcerated incisional hernia x2, right lower quadrant containing small bowel which was viable, left lower quadrant containing fat. - Subq drain in place. Maintain for now. May be able to come out before d/c  - Midline wound with staples, c/d/i - NGT in place till patient has ROBF - Mobilize, OOB. PT rec HH - Pulm toilet - AM labs   FEN - NPO, NGT, IVF VTE - SCDs, Loveonx ID - Cefotetan periop Foley - None currently. Voiding.   Follow-Up - Dr. Janee Morn   LOS: 2 days    Jacinto Halim , [facility] 07/31/2020, 10:26 AM Please see Amion for pager number during day hours 7:00am-4:30pm

## 2020-07-31 NOTE — Plan of Care (Signed)
  Problem: Health Behavior/Discharge Planning: Goal: Ability to manage health-related needs will improve Outcome: Progressing   Problem: Clinical Measurements: Goal: Will remain free from infection Outcome: Progressing   

## 2020-07-31 NOTE — Plan of Care (Signed)

## 2020-08-01 LAB — CBC
HCT: 36.3 % (ref 36.0–46.0)
Hemoglobin: 11.9 g/dL — ABNORMAL LOW (ref 12.0–15.0)
MCH: 28.7 pg (ref 26.0–34.0)
MCHC: 32.8 g/dL (ref 30.0–36.0)
MCV: 87.7 fL (ref 80.0–100.0)
Platelets: 200 10*3/uL (ref 150–400)
RBC: 4.14 MIL/uL (ref 3.87–5.11)
RDW: 13.8 % (ref 11.5–15.5)
WBC: 9.4 10*3/uL (ref 4.0–10.5)
nRBC: 0 % (ref 0.0–0.2)

## 2020-08-01 LAB — BASIC METABOLIC PANEL
Anion gap: 7 (ref 5–15)
BUN: 8 mg/dL (ref 6–20)
CO2: 31 mmol/L (ref 22–32)
Calcium: 8.4 mg/dL — ABNORMAL LOW (ref 8.9–10.3)
Chloride: 101 mmol/L (ref 98–111)
Creatinine, Ser: 0.66 mg/dL (ref 0.44–1.00)
GFR, Estimated: >60 mL/min (ref >60)
Glucose, Bld: 121 mg/dL — ABNORMAL HIGH (ref 70–99)
Potassium: 3.7 mmol/L (ref 3.5–5.1)
Sodium: 139 mmol/L (ref 135–145)

## 2020-08-01 LAB — MAGNESIUM: Magnesium: 2.2 mg/dL (ref 1.7–2.4)

## 2020-08-01 NOTE — Progress Notes (Signed)
Patient seen and examined and has been reliably stable in terms of the glycemic control over the past several days-she is feeling better than yesterday and passing some gas She definitely needs weight loss and it so happens that her current surgeon is a bariatric surgeon-it may be reasonable at some point to consider a gastric sleeve?  With all of her adhesiolysis in the past I am not sure what they think about that She can discharge home if she is willing on Metformin XR 500 daily otherwise she can follow-up with her PCP if she wants to trial nutrition diet and weight loss that way Please ensure also on discharge that she has follow-up with gynecology because of a adnexal mass in 2010  I am available through today and can see the patient tomorrow if you need me to however otherwise I will sign off as of 08/02/2020-please let me know-  Preferential communication by epic chat   Pleas Koch, MD [facility] 3:50 PM

## 2020-08-01 NOTE — Plan of Care (Signed)
?  Problem: Clinical Measurements: ?Goal: Ability to maintain clinical measurements within normal limits will improve ?Outcome: Progressing ?Goal: Will remain free from infection ?Outcome: Progressing ?Goal: Diagnostic test results will improve ?Outcome: Progressing ?  ?

## 2020-08-01 NOTE — Progress Notes (Signed)
NG tube clamped for most of the day, and patient with no nausea and abdominal pain. Tolerating sips of clear. Will continue to monitor.

## 2020-08-01 NOTE — Progress Notes (Signed)
3 Days Post-Op  Subjective: CC: Patient reports after yesterday morning she had resolution of her nausea. She is only having some mild pain at the top of her incision. She began passing some flatus this morning. No bm. She mobilized from the bed to the chair yesterday and this morning. She is oob in the chair currently. She is voiding without difficulty.   After I was able to get NGT functioning, she had 1.3L out in the last 24 hours that is bilious.   Objective: Vital signs in last 24 hours: Temp:  [98.3 F (36.8 C)-99.2 F (37.3 C)] 98.7 F (37.1 C) (03/20 0700) Pulse Rate:  [88-101] 88 (03/20 0700) Resp:  [17-18] 18 (03/20 0700) BP: (142-150)/(74-92) 150/92 (03/20 0700) SpO2:  [94 %-96 %] 94 % (03/20 0700) Last BM Date: 07/29/20  Intake/Output from previous day: 03/19 0701 - 03/20 0700 In: 2239.4 [P.O.:240; I.V.:1609.4; NG/GT:240; IV Piggyback:150] Out: 1315 [Emesis/NG output:1300; Drains:15] Intake/Output this shift: No intake/output data recorded.  PE: Gen: Alert, NAD, pleasant Card: RRR Pulm: CTAB, no W/R/R, effort normal DTO:[id number] abdomen that is soft, there is tenderness around the top of the incision that appears appropriate in post operative setting, +BS. Midline wound with staples in place. No signs of infection or drainage. Drain in subq with SS output. NGT in place with bilious output in cannister, 1.3L/24 hours.  Ext: No LE Edema Psych: A&Ox3  Skin: no rashes noted, warm and dry  Lab Results:  Recent Labs    07/30/20 0341 08/01/20 0126  WBC 14.7* 9.4  HGB 13.6 11.9*  HCT 42.2 36.3  PLT 238 200   BMET Recent Labs    07/30/20 0341 08/01/20 0126  NA 136 139  K 4.1 3.7  CL 98 101  CO2 29 31  GLUCOSE 170* 121*  BUN 13 8  CREATININE 0.83 0.66  CALCIUM 8.7* 8.4*   PT/INR No results for input(s): LABPROT, INR in the last 72 hours. CMP     Component Value Date/Time   NA 139 08/01/2020 0126   K 3.7 08/01/2020 0126   CL 101  08/01/2020 0126   CO2 31 08/01/2020 0126   GLUCOSE 121 (H) 08/01/2020 0126   BUN 8 08/01/2020 0126   CREATININE 0.66 08/01/2020 0126   CALCIUM 8.4 (L) 08/01/2020 0126   PROT 7.4 07/29/2020 0809   ALBUMIN 3.9 07/29/2020 0809   AST 18 07/29/2020 0809   ALT 19 07/29/2020 0809   ALKPHOS 73 07/29/2020 0809   BILITOT 0.8 07/29/2020 0809   GFRNONAA >60 08/01/2020 0126   GFRAA >60 04/05/2018 1031   Lipase     Component Value Date/Time   LIPASE 27 07/29/2020 0809       Studies/Results: ECHOCARDIOGRAM COMPLETE  Result Date: 07/30/2020    ECHOCARDIOGRAM REPORT   Patient Name:   Kaitlyn Bray (age 55) Date of Exam: 07/30/2020 Medical Rec #:  [id number]       Height:       65.0 in Accession #:    [id number]      Weight:       249.3 lb Date of Birth:  August 09, 1965 (age 55)        BSA:          2.172 m Patient Age:    55 years        BP:           144/88 mmHg Patient Gender: F               HR:  65 bpm. Exam Location:  Inpatient Procedure: 2D Echo, 3D Echo, Cardiac Doppler, Color Doppler and Strain Analysis                                MODIFIED REPORT:   This report was modified by Weston Brass MD on 07/30/2020 due to quality                                    comments.  Indications:     Murmur  History:         Patient has no prior history of Echocardiogram examinations.                  CHF; Risk Factors:Diabetes.  Sonographer:     Neomia Dear RDCS Referring Phys:  [id number] Laurell Josephs THOMPSON Diagnosing Phys: Weston Brass MD  Sonographer Comments: Technically difficult study due to poor echo windows. IMPRESSIONS  1. Left ventricular ejection fraction, by estimation, is 60 to 65%. The left ventricle has normal function. Left ventricular endocardial border not optimally defined to evaluate regional wall motion. Left ventricular diastolic parameters were normal.  2. Right ventricular systolic function is normal. The right ventricular size is mildly enlarged. Tricuspid regurgitation signal is inadequate for assessing  PA pressure.  3. The mitral valve was not well visualized. No evidence of mitral valve regurgitation.  4. The aortic valve was not well visualized. Aortic valve regurgitation is not visualized. Mild aortic valve sclerosis is present, with no evidence of aortic valve stenosis.  5. The inferior vena cava is normal in size with greater than 50% respiratory variability, suggesting right atrial pressure of 3 mmHg. FINDINGS  Left Ventricle: Left ventricular ejection fraction, by estimation, is 60 to 65%. The left ventricle has normal function. Left ventricular endocardial border not optimally defined to evaluate regional wall motion. Global longitudinal strain performed but  not reported based on interpreter judgement due to suboptimal tracking. 3D left ventricular ejection fraction analysis performed but not reported based on interpreter judgement due to suboptimal quality. The left ventricular internal cavity size was normal in size. There is no left ventricular hypertrophy. Left ventricular diastolic parameters were normal. Right Ventricle: The right ventricular size is mildly enlarged. Right vetricular wall thickness was not well visualized. Right ventricular systolic function is normal. Tricuspid regurgitation signal is inadequate for assessing PA pressure. Left Atrium: Left atrial size was normal in size. Right Atrium: Right atrial size was normal in size. Pericardium: There is no evidence of pericardial effusion. Mitral Valve: The mitral valve was not well visualized. No evidence of mitral valve regurgitation. Tricuspid Valve: The tricuspid valve is not well visualized. Tricuspid valve regurgitation is trivial. Aortic Valve: The aortic valve was not well visualized. Aortic valve regurgitation is not visualized. Mild aortic valve sclerosis is present, with no evidence of aortic valve stenosis. Aortic valve mean gradient measures 8.0 mmHg. Aortic valve peak gradient measures 15.8 mmHg. Aortic valve area, by VTI  measures 1.76 cm. Pulmonic Valve: The pulmonic valve was not well visualized. Pulmonic valve regurgitation is not visualized. Aorta: The aortic root is normal in size and structure. Venous: The inferior vena cava was not well visualized. The inferior vena cava is normal in size with greater than 50% respiratory variability, suggesting right atrial pressure of 3 mmHg. IAS/Shunts: The interatrial septum was not well visualized.  LEFT VENTRICLE PLAX 2D  LVIDd:         4.30 cm  Diastology LVIDs:         2.40 cm  LV e' medial:    6.96 cm/s LV PW:         0.90 cm  LV E/e' medial:  12.9 LV IVS:        1.10 cm  LV e' lateral:   10.60 cm/s LVOT diam:     1.60 cm  LV E/e' lateral: 8.5 LV SV:         67 LV SV Index:   31 LVOT Area:     2.01 cm  RIGHT VENTRICLE RV S prime:     13.80 cm/s  PULMONARY VEINS TAPSE (M-mode): 1.9 cm      A Reversal Duration: 135.00 msec                             A Reversal Velocity: 36.00 cm/s                             Diastolic Velocity:  34.60 cm/s                             S/D Velocity:        1.50                             Systolic Velocity:   52.80 cm/s LEFT ATRIUM             Index       RIGHT ATRIUM           Index LA diam:        3.40 cm 1.57 cm/m  RA Area:     14.70 cm LA Vol (A2C):   37.7 ml 17.35 ml/m RA Volume:   32.10 ml  14.78 ml/m LA Vol (A4C):   49.9 ml 22.97 ml/m LA Biplane Vol: 45.2 ml 20.81 ml/m  AORTIC VALVE AV Area (Vmax):    1.63 cm AV Area (Vmean):   1.55 cm AV Area (VTI):     1.76 cm AV Vmax:           199.00 cm/s AV Vmean:          136.000 cm/s AV VTI:            0.383 m AV Peak Grad:      15.8 mmHg AV Mean Grad:      8.0 mmHg LVOT Vmax:         161.00 cm/s LVOT Vmean:        105.000 cm/s LVOT VTI:          0.335 m LVOT/AV VTI ratio: 0.87  AORTA Ao Root diam: 2.40 cm Ao Asc diam:  2.70 cm MITRAL VALVE MV Area (PHT): 2.99 cm    SHUNTS MV Decel Time: 254 msec    Systemic VTI:  0.34 m MV E velocity: 90.00 cm/s  Systemic Diam: 1.60 cm MV A velocity: 93.00 cm/s  MV E/A ratio:  0.97 Weston Brass MD Electronically signed by Weston Brass MD Signature Date/Time: 07/30/2020/4:55:47 PM    Final (Updated)     Anti-infectives: Anti-infectives (From admission, onward)   Start     Dose/Rate Route Frequency Ordered Stop   07/29/20 1600  cefoTEtan (CEFOTAN) 2 g  in sodium chloride 0.9 % 100 mL IVPB        2 g 200 mL/hr over 30 Minutes Intravenous On call to O.R. 07/29/20 1355 07/29/20 2038       Assessment/Plan HxHeart Murmur -appreicate TRH assistance.  Echo during admission with EF 60-65% w HxHypertension- appreciate TRH assistance  Pre-diabetes- A1c 6.2. This is stable from labs 1 and 3 years ago. Appreciate TRH assistance. She does have a PCP  SBO withIncarceratedIncisional Hernia S/p exploratory laparotomy, repair of incarcerated incisional hernia with mesh, placement of drain in subq - Dr. Janee Mornhompson - 07/29/2020 - POD #3 - Intra-op findings:Incarcerated incisional hernia x2, right lower quadrant containing small bowel which was viable, left lower quadrant containing fat. - Subq drain in place. Maintain for now. May be able to come out before d/c  - Midline wound with staples, c/d/i - NGT clamping trials today.  - Mobilize, OOB. PT rec HH - Pulm toilet - AM labs   FEN -NPO, NGT clamped, IVF VTE -SCDs, Loveonx ID -Cefotetan periop Foley - None currently. Voiding.  Follow-Up - Dr. Janee Mornhompson   LOS: 3 days    Jacinto HalimMichael M Malillany Kazlauskas , [facility] 08/01/2020, 10:11 AM Please see Amion for pager number during day hours 7:00am-4:30pm

## 2020-08-02 MED ORDER — METHOCARBAMOL 500 MG PO TABS
750.0000 mg | ORAL_TABLET | Freq: Three times a day (TID) | ORAL | Status: DC
  Administered 2020-08-02 – 2020-08-05 (×9): 750 mg via ORAL
  Filled 2020-08-02 (×9): qty 2

## 2020-08-02 MED ORDER — OXYCODONE HCL 5 MG PO TABS
5.0000 mg | ORAL_TABLET | ORAL | Status: DC | PRN
Start: 2020-08-02 — End: 2020-08-05
  Administered 2020-08-02 – 2020-08-04 (×4): 5 mg via ORAL
  Administered 2020-08-04: 10 mg via ORAL
  Administered 2020-08-04: 5 mg via ORAL
  Administered 2020-08-05: 10 mg via ORAL
  Filled 2020-08-02: qty 2
  Filled 2020-08-02 (×2): qty 1
  Filled 2020-08-02: qty 2
  Filled 2020-08-02 (×3): qty 1
  Filled 2020-08-02: qty 2

## 2020-08-02 NOTE — Progress Notes (Signed)
4 Days Post-Op   Subjective: Large flatus No nausea with NG clamped ROS negative except as listed above. Objective: Vital signs in last 24 hours: Temp:  [98 F (36.7 C)-98.4 F (36.9 C)] 98.2 F (36.8 C) (03/21 0828) Pulse Rate:  [72-93] 72 (03/21 0828) Resp:  [16-19] 16 (03/21 0828) BP: (124-150)/(76-89) 132/81 (03/21 0828) SpO2:  [94 %-97 %] 97 % (03/21 0828) Last BM Date: 07/29/20  Intake/Output from previous day: No intake/output data recorded. Intake/Output this shift: Total I/O In: 2936.6 [I.V.:2736.6; IV Piggyback:200] Out: -   General appearance: alert and cooperative Nose: NGT - removed Resp: clear to auscultation bilaterally Cardio: regular rate and rhythm GI: soft, incision drewsing OK, drain SS  Lab Results: CBC  Recent Labs    08/01/20 0126  WBC 9.4  HGB 11.9*  HCT 36.3  PLT 200   BMET Recent Labs    08/01/20 0126  NA 139  K 3.7  CL 101  CO2 31  GLUCOSE 121*  BUN 8  CREATININE 0.66  CALCIUM 8.4*   PT/INR No results for input(s): LABPROT, INR in the last 72 hours. ABG No results for input(s): PHART, HCO3 in the last 72 hours.  Invalid input(s): PCO2, PO2  Studies/Results: No results found.  Anti-infectives: Anti-infectives (From admission, onward)   Start     Dose/Rate Route Frequency Ordered Stop   07/29/20 1600  cefoTEtan (CEFOTAN) 2 g in sodium chloride 0.9 % 100 mL IVPB        2 g 200 mL/hr over 30 Minutes Intravenous On call to O.R. 07/29/20 1355 07/29/20 2038      Assessment/Plan: HxHeart Murmur -appreicate TRH assistance.  Echo during admission with EF 60-65% w HxHypertension- appreciate TRH assistance  Pre-diabetes- A1c 6.2. This is stable from labs 1 and 3 years ago. Appreciate TRH assistance. She does have a PCP  SBO withIncarceratedIncisional Hernia S/p exploratory laparotomy, repair of incarcerated incisional hernia with mesh, placement of drain in subq - Dr. Janee Morn - 07/29/2020 - POD #3 - Intra-op  findings:Incarcerated incisional hernia x2, right lower quadrant containing small bowel which was viable, left lower quadrant containing fat. - Subq drain in place. Maintain for now. May be able to come out before d/c  - Midline wound with staples, c/d/i - NGT clamping trials today.  - Mobilize, OOB. PT rec HH - Pulm toilet - AM labs   FEN -D/C NGT and start CL, start oxy and change robaxin to PO VTE -SCDs, Loveonx ID -Cefotetan periop Foley - None currently. Voiding.  Follow-Up - Dr. Janee Morn   LOS: 4 days    [name], MD, MPH, FACS Trauma & General Surgery Use [email or web address] to contact on call provider  3/21/2022Patient ID: Kaitlyn Bray, female   DOB: [birth year], 55 y.o.   MRN: [id number]

## 2020-08-02 NOTE — Progress Notes (Signed)
Physical Therapy Treatment Patient Details Name: Kaitlyn Bray MRN: [id number] DOB: 17-Apr-1966 Today's Date: 08/02/2020    History of Present Illness Kaitlyn Bray is a 55 y/o female who presented to ED with abdominal pain, nausea, vomiting, reports of inability to eat, and constipation. Pt was admitted on 07/29/20 and diagnosed with incarcerated hiatal hernia. S/p exploratory laparotomy on 07/29/20. PMh inclused HTN, appendectomy, hysterectomy, asthma, and COVID (Around Jan 2022).    PT Comments    Pt supine in bed requesting to move OOB to bathroom.  Post toileting performed 2nd ambulation session of the day with PTA.  Pt remains guarded due to pain which limits her distance.  Continue to recommend HHPT at d/c if able ( no insurance ).     Follow Up Recommendations  Home health PT;Supervision for mobility/OOB;Other (comment)     Equipment Recommendations  Rolling walker with 5" wheels;3in1 (PT)    Recommendations for Other Services       Precautions / Restrictions Precautions Precautions: Fall Precaution Comments: abdominal incision, RLQ drain Restrictions Weight Bearing Restrictions: No    Mobility  Bed Mobility Overal bed mobility: Needs Assistance Bed Mobility: Rolling;Sidelying to Sit;Sit to Sidelying Rolling: Supervision Sidelying to sit: Supervision     Sit to sidelying: Supervision General bed mobility comments: Supervision with cues for safety.    Transfers Overall transfer level: Needs assistance Equipment used: None Transfers: Sit to/from Stand Sit to Stand: Supervision         General transfer comment: Supervision, guarding due to pain.  Ambulation/Gait Ambulation/Gait assistance: Supervision Gait Distance (Feet): 70 Feet Assistive device: None Gait Pattern/deviations: Step-through pattern;Trunk flexed;Wide base of support     General Gait Details: Pt very slow and guarded reporting abdominal discomfort.  Pt educated to hold pillow at incision site to  splint incision and provide comfort to decrease pain.   Stairs             Wheelchair Mobility    Modified Rankin (Stroke Patients Only)       Balance Overall balance assessment: Needs assistance   Sitting balance-Leahy Scale: Fair       Standing balance-Leahy Scale: Poor Standing balance comment: reliant on UE support, likely due to pain                            Cognition Arousal/Alertness: Awake/alert Behavior During Therapy: WFL for tasks assessed/performed Overall Cognitive Status: Within Functional Limits for tasks assessed                                        Exercises      General Comments        Pertinent Vitals/Pain Pain Assessment: 0-10 Pain Score: 6  Faces Pain Scale: Hurts whole lot Pain Location: abdominals, incision Pain Descriptors / Indicators: Constant;Throbbing;Aching Pain Intervention(s): Monitored during session;Patient requesting pain meds-RN notified;RN gave pain meds during session;Repositioned    Home Living                      Prior Function            PT Goals (current goals can now be found in the care plan section) Acute Rehab PT Goals Patient Stated Goal: less pain Potential to Achieve Goals: Good Progress towards PT goals: Progressing toward goals    Frequency    Min 5X/week  PT Plan Current plan remains appropriate    Co-evaluation              AM-PAC PT "6 Clicks" Mobility   Outcome Measure  Help needed turning from your back to your side while in a flat bed without using bedrails?: A Little Help needed moving from lying on your back to sitting on the side of a flat bed without using bedrails?: A Little Help needed moving to and from a bed to a chair (including a wheelchair)?: A Little Help needed standing up from a chair using your arms (e.g., wheelchair or bedside chair)?: A Little Help needed to walk in hospital room?: A Little Help needed climbing  3-5 steps with a railing? : A Little 6 Click Score: 18    End of Session Equipment Utilized During Treatment: Gait belt;Oxygen Activity Tolerance: Patient tolerated treatment well Patient left: with call bell/phone within reach;in bed;with bed alarm set Nurse Communication: Mobility status;Patient requests pain meds PT Visit Diagnosis: Unsteadiness on feet (R26.81);Pain     Time: 1425-1444 PT Time Calculation (min) (ACUTE ONLY): 19 min  Charges:  $Gait Training: 8-22 mins                     Bonney Leitz , PTA Acute Rehabilitation Services Pager [phone] Office [phone]     Kandace Elrod Artis Delay 08/02/2020, 2:52 PM

## 2020-08-02 NOTE — Plan of Care (Signed)
Pt alert and oriented x 4, not in distress, denies pain, states she feels better now that the NGT was removed.   Problem: Education: Goal: Required Educational Video(s) Outcome: Progressing   Problem: Clinical Measurements: Goal: Postoperative complications will be avoided or minimized Outcome: Progressing   Problem: Skin Integrity: Goal: Demonstration of wound healing without infection will improve Outcome: Progressing   Problem: Clinical Measurements: Goal: Ability to maintain clinical measurements within normal limits will improve Outcome: Progressing   Problem: Activity: Goal: Risk for activity intolerance will decrease Outcome: Progressing   Problem: Nutrition: Goal: Adequate nutrition will be maintained Outcome: Progressing   Problem: Elimination: Goal: Will not experience complications related to bowel motility Outcome: Progressing   Problem: Safety: Goal: Ability to remain free from injury will improve Outcome: Progressing

## 2020-08-03 NOTE — Progress Notes (Signed)
Physical Therapy Treatment Patient Details Name: Kaitlyn Bray MRN: [id number] DOB: 1965-08-13 Today's Date: 08/03/2020    History of Present Illness Kaitlyn Bray is a 55 y/o female who presented to ED with abdominal pain, nausea, vomiting, reports of inability to eat, and constipation. Pt was admitted on 07/29/20 and diagnosed with incarcerated hiatal hernia. S/p exploratory laparotomy on 07/29/20. PMh inclused HTN, appendectomy, hysterectomy, asthma, and COVID (Around Jan 2022).    PT Comments    Pt doing very well with mobility. Instructed pt to continue ambulation on her own in room and hallways until dc. Pt does not need any PT or equipment at DC.    Follow Up Recommendations  No PT follow up     Equipment Recommendations  None recommended by PT    Recommendations for Other Services       Precautions / Restrictions Precautions Precaution Comments: abdominal incision, RLQ drain Restrictions Weight Bearing Restrictions: No    Mobility  Bed Mobility Overal bed mobility: Modified Independent Bed Mobility: Sit to Supine       Sit to supine: Modified independent (Device/Increase time)        Transfers Overall transfer level: Modified independent Equipment used: None Transfers: Sit to/from Stand Sit to Stand: Modified independent (Device/Increase time)            Ambulation/Gait Ambulation/Gait assistance: Modified independent (Device/Increase time) Gait Distance (Feet): 350 Feet Assistive device: None Gait Pattern/deviations: Decreased stride length   Gait velocity interpretation: >2.62 ft/sec, indicative of community ambulatory General Gait Details: Steady gait without assistive device   Stairs             Wheelchair Mobility    Modified Rankin (Stroke Patients Only)       Balance Overall balance assessment: No apparent balance deficits (not formally assessed)                                          Cognition  Arousal/Alertness: Awake/alert Behavior During Therapy: WFL for tasks assessed/performed Overall Cognitive Status: Within Functional Limits for tasks assessed                                        Exercises      General Comments        Pertinent Vitals/Pain Pain Assessment: Faces Faces Pain Scale: Hurts a little bit Pain Location: abdominals, incision Pain Descriptors / Indicators: Guarding;Sore Pain Intervention(s): Limited activity within patient's tolerance    Home Living                      Prior Function            PT Goals (current goals can now be found in the care plan section) Progress towards PT goals: Goals met/education completed, patient discharged from PT    Frequency           PT Plan Discharge plan needs to be updated    Co-evaluation              AM-PAC PT "6 Clicks" Mobility   Outcome Measure  Help needed turning from your back to your side while in a flat bed without using bedrails?: None Help needed moving from lying on your back to sitting on the side of a flat bed without  using bedrails?: None Help needed moving to and from a bed to a chair (including a wheelchair)?: None Help needed standing up from a chair using your arms (e.g., wheelchair or bedside chair)?: None Help needed to walk in hospital room?: None Help needed climbing 3-5 steps with a railing? : None 6 Click Score: 24    End of Session   Activity Tolerance: Patient tolerated treatment well Patient left: in bed;with call bell/phone within reach Nurse Communication: Mobility status PT Visit Diagnosis: Other abnormalities of gait and mobility (R26.89)     Time: 1244-1301 PT Time Calculation (min) (ACUTE ONLY): 17 min  Charges:  $Gait Training: 8-22 mins                     [place] Pager [phone] Office [place] 08/03/2020, 2:30 PM

## 2020-08-03 NOTE — Plan of Care (Signed)
Pt is alert and oriented x 4, had been ambulating to the bathroom since this morning, denies pain.  Problem: Education: Goal: Required Educational Video(s) Outcome: Progressing   Problem: Clinical Measurements: Goal: Postoperative complications will be avoided or minimized Outcome: Progressing   Problem: Skin Integrity: Goal: Demonstration of wound healing without infection will improve Outcome: Progressing   Problem: Health Behavior/Discharge Planning: Goal: Ability to manage health-related needs will improve Outcome: Progressing   Problem: Clinical Measurements: Goal: Ability to maintain clinical measurements within normal limits will improve Outcome: Progressing   Problem: Activity: Goal: Risk for activity intolerance will decrease Outcome: Progressing   Problem: Nutrition: Goal: Adequate nutrition will be maintained Outcome: Progressing   Problem: Coping: Goal: Level of anxiety will decrease Outcome: Progressing   Problem: Pain Managment: Goal: General experience of comfort will improve Outcome: Progressing   Problem: Safety: Goal: Ability to remain free from injury will improve Outcome: Progressing   Problem: Skin Integrity: Goal: Risk for impaired skin integrity will decrease Outcome: Progressing

## 2020-08-03 NOTE — Progress Notes (Signed)
Patient ID: Kaitlyn Bray, female   DOB: [birth year], 55 y.o.   MRN: [id number] 5 Days Post-Op   Subjective: Taking clears, some reflux, +flatus ROS negative except as listed above. Objective: Vital signs in last 24 hours: Temp:  [97.8 F (36.6 C)-99.2 F (37.3 C)] 97.9 F (36.6 C) (03/22 0845) Pulse Rate:  [73-89] 73 (03/22 0845) Resp:  [17-18] 17 (03/22 0845) BP: (136-153)/(73-94) 145/83 (03/22 0845) SpO2:  [94 %-97 %] 94 % (03/22 0845) Last BM Date: 07/29/20  Intake/Output from previous day: 03/21 0701 - 03/22 0700 In: 4401.6 [P.O.:1060; I.V.:3141.6; IV Piggyback:200] Out: 15 [Drains:15] Intake/Output this shift: No intake/output data recorded.  General appearance: cooperative Resp: clear to auscultation bilaterally Cardio: regular rate and rhythm GI: soft, JP SS, incision CDI, +BS  Lab Results: CBC  Recent Labs    08/01/20 0126  WBC 9.4  HGB 11.9*  HCT 36.3  PLT 200   BMET Recent Labs    08/01/20 0126  NA 139  K 3.7  CL 101  CO2 31  GLUCOSE 121*  BUN 8  CREATININE 0.66  CALCIUM 8.4*   PT/INR No results for input(s): LABPROT, INR in the last 72 hours. ABG No results for input(s): PHART, HCO3 in the last 72 hours.  Invalid input(s): PCO2, PO2  Studies/Results: No results found.  Anti-infectives: Anti-infectives (From admission, onward)   Start     Dose/Rate Route Frequency Ordered Stop   07/29/20 1600  cefoTEtan (CEFOTAN) 2 g in sodium chloride 0.9 % 100 mL IVPB        2 g 200 mL/hr over 30 Minutes Intravenous On call to O.R. 07/29/20 1355 07/29/20 2038      Assessment/Plan: HxHeart Murmur -appreicate TRH assistance.  Echo during admission with EF 60-65% w HxHypertension- appreciate TRH assistance  Pre-diabetes- A1c 6.2. This is stable from labs 1 and 3 years ago. Appreciate TRH assistance. She does have a PCP  SBO withIncarceratedIncisional Hernia S/p exploratory laparotomy, primary repair of incarcerated incisional hernia,  placement of drain in subq - Dr. Janee Morn - 07/29/2020 - Intra-op findings:Incarcerated incisional hernia x2, right lower quadrant containing small bowel which was viable, left lower quadrant containing fat. - Subq drain in place. Maintain for now. May be able to come out before d/c  - Midline wound with staples, c/d/i - fulls - Mobilize, OOB. PT rec HH - Pulm toilet - AM labs  - did well with PT/OT FEN -fulls, decrease IVF to 50/h VTE -SCDs, Loveonx ID -Cefotetan periop Foley - None currently. Voiding.  Follow-Up - Dr. Janee Morn  LOS: 5 days    Kaitlyn Gelinas, MD, MPH, FACS Trauma & General Surgery Use [email or web address] to contact on call provider  08/03/2020

## 2020-08-03 NOTE — Plan of Care (Signed)
  Problem: Skin Integrity: Goal: Demonstration of wound healing without infection will improve Outcome: Progressing   Problem: Education: Goal: Knowledge of General Education information will improve Description: Including pain rating scale, medication(s)/side effects and non-pharmacologic comfort measures Outcome: Progressing   Problem: Health Behavior/Discharge Planning: Goal: Ability to manage health-related needs will improve Outcome: Progressing

## 2020-08-04 LAB — BASIC METABOLIC PANEL
Anion gap: 7 (ref 5–15)
BUN: 5 mg/dL — ABNORMAL LOW (ref 6–20)
CO2: 28 mmol/L (ref 22–32)
Calcium: 8.8 mg/dL — ABNORMAL LOW (ref 8.9–10.3)
Chloride: 101 mmol/L (ref 98–111)
Creatinine, Ser: 0.62 mg/dL (ref 0.44–1.00)
GFR, Estimated: >60 mL/min (ref >60)
Glucose, Bld: 112 mg/dL — ABNORMAL HIGH (ref 70–99)
Potassium: 3.7 mmol/L (ref 3.5–5.1)
Sodium: 136 mmol/L (ref 135–145)

## 2020-08-04 LAB — CBC
HCT: 34.9 % — ABNORMAL LOW (ref 36.0–46.0)
Hemoglobin: 11.7 g/dL — ABNORMAL LOW (ref 12.0–15.0)
MCH: 28.9 pg (ref 26.0–34.0)
MCHC: 33.5 g/dL (ref 30.0–36.0)
MCV: 86.2 fL (ref 80.0–100.0)
Platelets: 232 10*3/uL (ref 150–400)
RBC: 4.05 MIL/uL (ref 3.87–5.11)
RDW: 13.6 % (ref 11.5–15.5)
WBC: 9.2 10*3/uL (ref 4.0–10.5)
nRBC: 0 % (ref 0.0–0.2)

## 2020-08-04 NOTE — Progress Notes (Signed)
6 Days Post-Op  Subjective: CC: Doing well. Feels like she overdid it yesterday with how much she was walking around and now just wants to rest this morning. She reports some soreness of her incision but her pain is well controlled. She is tolerating fld without increased abdominal pain, n/v. Passing flatus. Decent sized formed bm last night. Voiding without difficulty. Cleared by PT yesterday for no follow up.   Objective: Vital signs in last 24 hours: Temp:  [98 F (36.7 C)-98.5 F (36.9 C)] 98.5 F (36.9 C) (03/23 0820) Pulse Rate:  [69-77] 74 (03/23 0820) Resp:  [17-18] 17 (03/23 0820) BP: (123-144)/(72-82) 123/77 (03/23 0820) SpO2:  [94 %-99 %] 94 % (03/23 0820) Last BM Date: 08/03/20  Intake/Output from previous day: 03/22 0701 - 03/23 0700 In: 1466.1 [I.V.:1466.1] Out: -  Intake/Output this shift: No intake/output data recorded.  PE: Gen: Alert, NAD, pleasant Card: RRR Pulm: CTAB, no W/R/R, effort normal BMW:[id number] abdomen that is soft, there is very mild tenderness around the incision that appears appropriate in post operative setting. She is otherwise NT. +BS. Midline wound with staples in place. No signs of infection or drainage. Drain in subq with SS output. Ext: No LE Edema Psych: A&Ox3  Skin: no rashes noted, warm and dry  Lab Results:  Recent Labs    08/04/20 0411  WBC 9.2  HGB 11.7*  HCT 34.9*  PLT 232   BMET Recent Labs    08/04/20 0411  NA 136  K 3.7  CL 101  CO2 28  GLUCOSE 112*  BUN 5*  CREATININE 0.62  CALCIUM 8.8*   PT/INR No results for input(s): LABPROT, INR in the last 72 hours. CMP     Component Value Date/Time   NA 136 08/04/2020 0411   K 3.7 08/04/2020 0411   CL 101 08/04/2020 0411   CO2 28 08/04/2020 0411   GLUCOSE 112 (H) 08/04/2020 0411   BUN 5 (L) 08/04/2020 0411   CREATININE 0.62 08/04/2020 0411   CALCIUM 8.8 (L) 08/04/2020 0411   PROT 7.4 07/29/2020 0809   ALBUMIN 3.9 07/29/2020 0809   AST 18  07/29/2020 0809   ALT 19 07/29/2020 0809   ALKPHOS 73 07/29/2020 0809   BILITOT 0.8 07/29/2020 0809   GFRNONAA >60 08/04/2020 0411   GFRAA >60 04/05/2018 1031   Lipase     Component Value Date/Time   LIPASE 27 07/29/2020 0809       Studies/Results: No results found.  Anti-infectives: Anti-infectives (From admission, onward)   Start     Dose/Rate Route Frequency Ordered Stop   07/29/20 1600  cefoTEtan (CEFOTAN) 2 g in sodium chloride 0.9 % 100 mL IVPB        2 g 200 mL/hr over 30 Minutes Intravenous On call to O.R. 07/29/20 1355 07/29/20 2038       Assessment/Plan HxHeart Murmur -appreicate TRH assistance. Echo during admission with EF 60-65% w HxHypertension- appreciate TRH assistance  Pre-diabetes- A1c 6.2. This is stable from labs 1 and 3 years ago. Appreciate TRH assistance. She does have a PCP  SBO withIncarceratedIncisional Hernia S/p exploratory laparotomy, primary repair of incarcerated incisional hernia, placement of drain in subq - Dr. Janee Morn - 07/29/2020 - Intra-op findings:Incarcerated incisional hernia x2, right lower quadrant containing small bowel which was viable, left lower quadrant containing fat. - Subq drain in place. Maintain for now. May be able to come out before d/c  - Midline wound with staples, c/d/i - Adv to soft diet.  -  Mobilize, OOB. PTcleared - no f/u - Pulm toilet - Possible pm discharge  FEN - Soft diet, d/c IVF.  VTE -SCDs, Loveonx ID -Cefotetan periop Foley - None currently. Voiding.  Follow-Up - Dr. Janee Morn   LOS: 6 days    Jacinto Halim , [facility] 08/04/2020, 9:14 AM Please see Amion for pager number during day hours 7:00am-4:30pm

## 2020-08-04 NOTE — TOC Progression Note (Signed)
Transition of Care [facility]) - Progression Note    Patient Details  Name: SHABRE KREHER MRN: [id number] Date of Birth: 09-03-1965  [facility]) CM/SW Contact  Epifanio Lesches, RN Phone Number: 08/04/2020, 2:15 PM  Clinical Narrative:    Admitted with abdominal pain, found to have Incarcerated ventral hernia. From home with husband. States independent with ADL's, no DME usage PTA. 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 Pt without insurance... FC referral made.  PCP: Nicki Reaper.                   - S/p EXPLORATORY LAPAROTOMY, REPAIR OF INCARCERATED INCISIONAL HERNIA WITH MESH, 3/17  Pt states has no problems affording Rx meds. Husband to provide transportation to home once to d/c.   [facility] team will continue monitor and assist with needs.   Expected Discharge Plan: Home/Self Care Barriers to Discharge: Continued Medical Work up  Expected Discharge Plan and Services Expected Discharge Plan: Home/Self Care                                               Social Determinants of Health (SDOH) Interventions    Readmission Risk Interventions No flowsheet data found.

## 2020-08-05 MED ORDER — OXYCODONE HCL 5 MG PO TABS: 5.0000 mg | ORAL_TABLET | Freq: Four times a day (QID) | ORAL | 0 refills | Status: DC | PRN

## 2020-08-05 MED ORDER — ONDANSETRON 4 MG PO TBDP: 4.0000 mg | ORAL_TABLET | Freq: Four times a day (QID) | ORAL | 0 refills | Status: DC | PRN

## 2020-08-05 MED ORDER — METHOCARBAMOL 750 MG PO TABS: 750.0000 mg | ORAL_TABLET | Freq: Three times a day (TID) | ORAL | 0 refills | Status: DC | PRN

## 2020-08-05 NOTE — TOC Transition Note (Signed)
Transition of Care [facility]) - CM/SW Discharge Note   Patient Details  Name: Kaitlyn Bray MRN: [id number] Date of Birth: [birth year]  Transition of Care [facility]) CM/SW Contact:  Epifanio Lesches, RN Phone Number: 08/05/2020, 12:05 PM   Clinical Narrative:    Patient will DC to: home Anticipated DC date: 08/05/2020 Family notified: yes, husband Transport by: car               - S/p EXPLORATORY LAPAROTOMY, REPAIR OFINCARCERATED INCISIONAL HERNIA WITH MESH, 3/17  Per MD patient ready for DC today . RN, patient, patient's husband aware of DC.  Pt states has no problems affording Rx meds. Husband to provide transportation to home once to d/c.  Post hospital f/u noted on AVS.  RNCM will sign off for now as intervention is no longer needed. Please consult [place] again if new needs arise.    Final next level of care: Home/Self Care Barriers to Discharge: No Barriers Identified   Patient Goals and CMS Choice        Discharge Placement                       Discharge Plan and Services                                     Social Determinants of Health (SDOH) Interventions     Readmission Risk Interventions No flowsheet data found.

## 2020-08-05 NOTE — Discharge Instructions (Signed)
[facility] _______[facility]   HERNIA REPAIR: POST OP INSTRUCTIONS  Always review your discharge instruction sheet given to you by the facility where your surgery was performed. IF YOU HAVE DISABILITY OR FAMILY LEAVE FORMS, YOU MUST BRING THEM TO THE OFFICE FOR PROCESSING.   DO NOT GIVE THEM TO YOUR DOCTOR.  1. A  prescription for pain medication may be given to you upon discharge.  Take your pain medication as prescribed, if needed.  If narcotic pain medicine is not needed, then you may take acetaminophen (Tylenol) or ibuprofen (Advil) as needed. 2. Take your usually prescribed medications unless otherwise directed. If you need a refill on your pain medication, please contact your pharmacy.  They will contact our office to request authorization. Prescriptions will not be filled after 5 pm or on week-ends. 3. You should follow a light diet the first 24 hours after arrival home, such as soup and crackers, etc.  Be sure to include lots of fluids daily.  Resume your normal diet the day after surgery. 4.Most patients will experience some swelling and bruising around the umbilicus or in the groin and scrotum.  Ice packs and reclining will help.  Swelling and bruising can take several days to resolve.  6. It is common to experience some constipation if taking pain medication after surgery.  Increasing fluid intake and taking a stool softener (such as Colace) will usually help or prevent this problem from occurring.  A mild laxative (Milk of Magnesia or Miralax) should be taken according to package directions if there are no bowel movements after 48 hours. 7. Unless discharge instructions indicate otherwise, you may remove your bandages 24-48 hours after surgery, and you may shower at that time.  You may have steri-strips (small skin tapes) in place directly over the incision.  These strips should be left on the skin for 7-10 days.  If your surgeon used skin glue on the incision, you may shower in  24 hours.  The glue will flake off over the next 2-3 weeks.  Any sutures or staples will be removed at the office during your follow-up visit. 8. ACTIVITIES:  You may resume regular (light) daily activities beginning the next day--such as daily self-care, walking, climbing stairs--gradually increasing activities as tolerated.  You may have sexual intercourse when it is comfortable.  Refrain from any heavy lifting or straining until approved by your doctor.  a.You may drive when you are no longer taking prescription pain medication, you can comfortably wear a seatbelt, and you can safely maneuver your car and apply brakes.   9.You should see your doctor in the office for a follow-up appointment approximately 2-3 weeks after your surgery.  Make sure that you call for this appointment within a day or two after you arrive home to insure a convenient appointment time.  WHEN TO CALL YOUR DOCTOR: 1. Fever over 101.0 2. Inability to urinate 3. Nausea and/or vomiting 4. Extreme swelling or bruising 5. Continued bleeding from incision. 6. Increased pain, redness, or drainage from the incision  The clinic staff is available to answer your questions during regular business hours.  Please don't hesitate to call and ask to speak to one of the nurses for clinical concerns.  If you have a medical emergency, go to the nearest emergency room or call 911.  [facility] is always on call at the hospital   [address], [address], [place], [place]  [id number] ?  [address] [place], [place]   [address] [phone] ? [phone] ? FAX [phone] Web site: [email or web address]  ........Marland Kitchen   Managing Your Pain After Surgery Without Opioids    Thank you for participating in our program to help patients manage their pain after surgery without opioids. This is part of our effort to provide you with the best care possible, without exposing you or your family to the  risk that opioids pose.  What pain can I expect after surgery? You can expect to have some pain after surgery. This is normal. The pain is typically worse the day after surgery, and quickly begins to get better. Many studies have found that many patients are able to manage their pain after surgery with Over-the-Counter (OTC) medications such as Tylenol and Motrin. If you have a condition that does not allow you to take Tylenol or Motrin, notify your surgical team.  How will I manage my pain? The best strategy for controlling your pain after surgery is around the clock pain control with Tylenol (acetaminophen) and Motrin (ibuprofen or Advil). Alternating these medications with each other allows you to maximize your pain control. In addition to Tylenol and Motrin, you can use heating pads or ice packs on your incisions to help reduce your pain.  How will I alternate your regular strength over-the-counter pain medication? You will take a dose of pain medication every three hours. ; Start by taking 650 mg of Tylenol (2 pills of 325 mg) ; 3 hours later take 600 mg of Motrin (3 pills of 200 mg) ; 3 hours after taking the Motrin take 650 mg of Tylenol ; 3 hours after that take 600 mg of Motrin.   - 1 -  See example - if your first dose of Tylenol is at 12:00 PM   12:00 PM Tylenol 650 mg (2 pills of 325 mg)  3:00 PM Motrin 600 mg (3 pills of 200 mg)  6:00 PM Tylenol 650 mg (2 pills of 325 mg)  9:00 PM Motrin 600 mg (3 pills of 200 mg)  Continue alternating every 3 hours   We recommend that you follow this schedule around-the-clock for at least 3 days after surgery, or until you feel that it is no longer needed. Use the table on the last page of this handout to keep track of the medications you are taking. Important: Do not take more than  of Tylenol or  of Motrin in a 24-hour period. Do not take ibuprofen/Motrin if you have a history of bleeding stomach ulcers, severe kidney  disease, &/or actively taking a blood thinner  What if I still have pain? If you have pain that is not controlled with the over-the-counter pain medications (Tylenol and Motrin or Advil) you might have what we call "breakthrough" pain. You will receive a prescription for a small amount of an opioid pain medication such as Oxycodone, Tramadol, or Tylenol with Codeine. Use these opioid pills in the first 24 hours after surgery if you have breakthrough pain. Do not take more than 1 pill every 4-6 hours.  If you still have uncontrolled pain after using all opioid pills, don't hesitate to call our staff using the number provided. We will help make sure you are managing your pain in the best way possible, and if necessary, we can provide a prescription for additional pain medication.   Day 1    Time  Name of Medication Number of pills taken  Amount of Acetaminophen  Pain Level  Comments  AM PM       AM PM       AM PM       AM PM       AM PM       AM PM       AM PM       AM PM       Total Daily amount of Acetaminophen Do not take more than  3,000 mg per day      Day 2    Time  Name of Medication Number of pills taken  Amount of Acetaminophen  Pain Level   Comments  AM PM       AM PM       AM PM       AM PM       AM PM       AM PM       AM PM       AM PM       Total Daily amount of Acetaminophen Do not take more than  3,000 mg per day      Day 3    Time  Name of Medication Number of pills taken  Amount of Acetaminophen  Pain Level   Comments  AM PM       AM PM       AM PM       AM PM          AM PM       AM PM       AM PM       AM PM       Total Daily amount of Acetaminophen Do not take more than  3,000 mg per day      Day 4    Time  Name of Medication Number of pills taken  Amount of Acetaminophen  Pain Level   Comments  AM PM       AM PM       AM PM       AM PM       AM PM       AM PM       AM PM       AM PM       Total Daily amount  of Acetaminophen Do not take more than  3,000 mg per day      Day 5    Time  Name of Medication Number of pills taken  Amount of Acetaminophen  Pain Level   Comments  AM PM       AM PM       AM PM       AM PM       AM PM       AM PM       AM PM       AM PM       Total Daily amount of Acetaminophen Do not take more than  3,000 mg per day       Day 6    Time  Name of Medication Number of pills taken  Amount of Acetaminophen  Pain Level  Comments  AM PM       AM PM       AM PM       AM PM       AM PM       AM PM       AM  PM       AM PM       Total Daily amount of Acetaminophen Do not take more than  3,000 mg per day      Day 7    Time  Name of Medication Number of pills taken  Amount of Acetaminophen  Pain Level   Comments  AM PM       AM PM       AM PM       AM PM       AM PM       AM PM       AM PM       AM PM       Total Daily amount of Acetaminophen Do not take more than  3,000 mg per day        For additional information about how and where to safely dispose of unused opioid medications - [email or web address]  Disclaimer: This document contains information and/or instructional materials adapted from [facility] for the typical patient with your condition. It does not replace medical advice from your health care provider because your experience may differ from that of the typical patient. Talk to your health care provider if you have any questions about this document, your condition or your treatment plan. Adapted from [facility]   Diabetes Care, 44(Suppl 1), [phone]. https://doi.org/https://doi.org/10.2337/dc21-S003">  Prediabetes Prediabetes is when your blood sugar (blood glucose) level is higher than normal but not high enough for you to be diagnosed with type 2 diabetes. Having prediabetes puts you at risk for developing type 2 diabetes (type 2 diabetes mellitus). With certain lifestyle changes, you may be  able to prevent or delay the onset of type 2 diabetes. This is important because type 2 diabetes can lead to serious complications, such as:  Heart disease.  Stroke.  Blindness.  Kidney disease.  Depression.  Poor circulation in the feet and legs. In severe cases, this could lead to surgical removal of a leg (amputation). What are the causes? The exact cause of prediabetes is not known. It may result from insulin resistance. Insulin resistance develops when cells in the body do not respond properly to insulin that the body makes. This can cause excess glucose to build up in the blood. High blood glucose (hyperglycemia) can develop. What increases the risk? The following factors may make you more likely to develop this condition:  You have a family member with type 2 diabetes.  You are older than 45 years.  You had a temporary form of diabetes during a pregnancy (gestational diabetes).  You had polycystic ovary syndrome (PCOS).  You are overweight or obese.  You are inactive (sedentary).  You have a history of heart disease, including problems with cholesterol levels, high levels of blood fats, or high blood pressure. What are the signs or symptoms? You may have no symptoms. If you do have symptoms, they may include:  Increased hunger.  Increased thirst.  Increased urination.  Vision changes, such as blurry vision.  Tiredness (fatigue). How is this diagnosed? This condition can be diagnosed with blood tests. Your blood glucose may be checked with one or more of the following tests:  A fasting blood glucose (FBG) test. You will not be allowed to eat (you will fast) for at least 8 hours before a blood sample is taken.  An A1C blood test (hemoglobin A1C). This test provides information about blood glucose levels over the previous 2?3 months.  An oral glucose  tolerance test (OGTT). This test measures your blood glucose at two points in time: ? After fasting. This is your  baseline level. ? Two hours after you drink a beverage that contains glucose. You may be diagnosed with prediabetes if:  Your FBG is 100?125 mg/dL (6.7-3.4 mmol/L).  Your A1C level is 5.7?6.4% (39-46 mmol/mol).  Your OGTT result is 140?199 mg/dL (1.9-37 mmol/L). These blood tests may be repeated to confirm your diagnosis.   How is this treated? Treatment may include dietary and lifestyle changes to help lower your blood glucose and prevent type 2 diabetes from developing. In some cases, medicine may be prescribed to help lower the risk of type 2 diabetes. Follow these instructions at home: Nutrition  Follow a healthy meal plan. This includes eating lean proteins, whole grains, legumes, fresh fruits and vegetables, low-fat dairy products, and healthy fats.  Follow instructions from your health care provider about eating or drinking restrictions.  Meet with a dietitian to create a healthy eating plan that is right for you.   Lifestyle  Do moderate-intensity exercise for at least 30 minutes a day on 5 or more days each week, or as told by your health care provider. A mix of activities may be best, such as: ? Brisk walking, swimming, biking, and weight lifting.  Lose weight as told by your health care provider. Losing 5-7% of your body weight can reverse insulin resistance.  Do not drink alcohol if: ? Your health care provider tells you not to drink. ? You are pregnant, may be pregnant, or are planning to become pregnant.  If you drink alcohol: ? Limit how much you use to:  0-1 drink a day for women.  0-2 drinks a day for men. ? Be aware of how much alcohol is in your drink. In the [place] one drink equals one 12 oz bottle of beer (355 mL), one 5 oz glass of wine (148 mL), or one 1 oz glass of hard liquor (44 mL). General instructions  Take over-the-counter and prescription medicines only as told by your health care provider. You may be prescribed medicines that help lower the risk  of type 2 diabetes.  Do not use any products that contain nicotine or tobacco, such as cigarettes, e-cigarettes, and chewing tobacco. If you need help quitting, ask your health care provider.  Keep all follow-up visits. This is important. Where to find more information  American Diabetes Association: www.diabetes.org  Academy of Nutrition and Dietetics: www.eatright.org  American Heart Association: www.heart.org Contact a health care provider if:  You have any of these symptoms: ? Increased hunger. ? Increased urination. ? Increased thirst. ? Fatigue. ? Vision changes, such as blurry vision. Get help right away if you:  Have shortness of breath.  Feel confused.  Vomit or feel like you may vomit. Summary  Prediabetes is when your blood sugar (blood glucose)level is higher than normal but not high enough for you to be diagnosed with type 2 diabetes.  Having prediabetes puts you at risk for developing type 2 diabetes (type 2 diabetes mellitus).  Make lifestyle changes such as eating a healthy diet and exercising regularly to help prevent diabetes. Lose weight as told by your health care provider. This information is not intended to replace advice given to you by your health care provider. Make sure you discuss any questions you have with your health care provider. Document Revised: 07/31/2019 Document Reviewed: 07/31/2019 Elsevier Patient Education  2021 [employer].

## 2020-08-05 NOTE — Discharge Summary (Signed)
Patient ID: Kaitlyn Bray [id number] 1965-10-09 55 y.o.  Admit date: 07/29/2020 Discharge date: 08/05/2020  Admitting Diagnosis: SBO with incarcerated Ventral hernia  Discharge Diagnosis SBO with incarcerated incisional hernia  HxHeart Murmur HxHypertension Pre-diabetes  Consultants TRH  H&P: Patient is a 55 year old female who presented to the ED with abdominal pain.  Patient describes nausea, vomiting, anorexia, p.o. intolerance that started yesterday. No prior hx of this.  She thought it was food poisoning at first.  Pain Right mid lower abdomen over her hernia She has not been able to have a recent bowel movement.  She has a known abdominal hernia and a history of hypertension. She has a know hernia and had her appendix removed by Dr. Claud Kelp when they did her hysterectomy.  She was told she needed to lose weight and come back for a hernia repair then.  She thinks that was 10 plus years ago.  She has not followed up.  Work-up in the ED patient is afebrile, temperature 99.  Blood pressure 162/101, heart rate 88, sats are 98% on room air.  CMP is normal except for chloride of 97 glucose of 131.  Lipase 27, WBC 11.5, H/H 14.5/hematocrit 45.8, platelets are 206,000.  Lipase is normal.  CT scan shows a moderate sized right lower abdominal ventral wall hernia with small bowel loops and resulting small bowel obstruction.  Left lower abdominal wall ventral hernia contains fluid no bowel within this hernia.  The prior history of abdominal hysterectomy, C-section, and myomectomy.  Covid is pending.  I have just ordered a lactate.  Procedures Dr. Janee Morn - Exploratory laparotomy, primary repair of incarcerated incisional hernia - 07/29/2020  Hospital Course:  Patient presented with abdominal pain, nausea, vomiting and was found to have incarcerated ventral hernia with small bowel loops on CT scan.  Patient was taken to the OR on 3/17 by Dr. Maisie Fus and underwent exploratory  laparotomy, primary repair of incarcerated incisional hernia.  Patient tolerated the procedure well was transferred back to the floor.  Patient developed an ileus postoperative.  This improved and NG tube was removed on 3/21.  Diet was advanced and tolerated.  Patient worked with therapies during admission initially recommended home health but then patient progressed and was cleared for no PT follow-up or equipment. On POD 7, the patient was voiding well, tolerating diet, ambulating well, pain well controlled, vital signs stable, incisions c/d/i and felt stable for discharge home. Subcu drain removed prior to discharged. Follow up as noted below. Return precautions discussed. A note was provided for work.   TRH was consulted initially for patient's heart murmur.  Patient went underwent an echo with mild aortic sclerosis but no stenosis of aortic valve or mitral valve and EF of 60 to 65%.  Patient did have mildly elevated glucoses during admission.  A1c 6.2.  This consistent with prior labs that were done as an outpatient and consistent with prediabetes.  TRH recommended Metformin versus follow-up with PCP.  Patient elected to hold off on starting metformin and wanted to follow up with her pcp to discuss diet and exercise before initiation of medication as outpatient. TRH recommended follow up for adnexal mass noted on CT in 2010, however patient has since had a hysterectomy and no adnexal mass was seen on CT during admission. She does have a gynecologist that she already follows with/see's and states she will discuss this with them during her next visit.   Physical Exam: Gen: Alert, NAD, pleasant Card: RRR  Pulm: CTAB, no W/R/R, effort normal RSW:[id number] abdomen that is soft, there is very mild tenderness aroundthe incision that appears appropriate in post operative setting. She is otherwise NT. +BS. Midline wound with staples in place. No signs of infection or drainage. Drain in subq with SS output.  Drain was removed and drain site covered with gauze and tape.  Ext: No LE Edema Psych: A&Ox3  Skin: no rashes noted, warm and dry  Allergies as of 08/05/2020      Reactions   Codeine    Fast heart rate   Penicillins Rash   Sulfa Drugs Cross Reactors Rash      Medication List    STOP taking these medications   ibuprofen 200 MG tablet Commonly known as: ADVIL   predniSONE 10 MG tablet Commonly known as: DELTASONE     TAKE these medications   albuterol 108 (90 Base) MCG/ACT inhaler Commonly known as: VENTOLIN HFA Inhale 1-2 puffs into the lungs every 6 (six) hours as needed for wheezing or shortness of breath.   benzonatate 100 MG capsule Commonly known as: TESSALON Take 1 capsule (100 mg total) by mouth 3 (three) times daily as needed for cough.   cetirizine 10 MG tablet Commonly known as: ZYRTEC Take 10 mg by mouth daily.   cholecalciferol 25 MCG (1000 UNIT) tablet Commonly known as: VITAMIN D3 Take 5,000 Units by mouth daily.   fluticasone 50 MCG/ACT nasal spray Commonly known as: FLONASE Place into both nostrils daily.   losartan-hydrochlorothiazide 50-12.5 MG tablet Commonly known as: HYZAAR Take 1 tablet by mouth daily.   meclizine 25 MG tablet Commonly known as: ANTIVERT Take 1 tablet (25 mg total) by mouth 3 (three) times daily as needed for dizziness or nausea.   methocarbamol 750 MG tablet Commonly known as: ROBAXIN Take 1 tablet (750 mg total) by mouth every 8 (eight) hours as needed for muscle spasms.   ondansetron 4 MG disintegrating tablet Commonly known as: ZOFRAN-ODT Take 1 tablet (4 mg total) by mouth every 6 (six) hours as needed for nausea.   oxyCODONE 5 MG immediate release tablet Commonly known as: Oxy IR/ROXICODONE Take 1 tablet (5 mg total) by mouth every 6 (six) hours as needed for breakthrough pain.   Vitamin D (Ergocalciferol) 1.25 MG (50000 UNIT) Caps capsule Commonly known as: DRISDOL Take 1 capsule (50,000 Units total) by  mouth every 7 (seven) days.            Durable Medical Equipment  (From admission, onward)         Start     Ordered   07/31/20 1034  For home use only DME Walker rolling  Once       Question Answer Comment  Walker: With 5 Inch Wheels   Patient needs a walker to treat with the following condition Status post surgery      07/31/20 1033            Follow-up Information    Baity, Salvadore Oxford, NP Follow up.   Specialties: Internal Medicine, Emergency Medicine Why: To discuss your elevated blood sugar levels here in the hospital  Contact information: [address] [place] [address] [phone]        [name], MD. Go on 08/20/2020.   Specialty: General Surgery Why: 3:30 PM. Please arrive 30 min prior to appointment time. Bring photo ID and insurance information.  Contact information: [address] [place] [address] [phone]  [facility] Follow up.   Why: The hospitalist recommended you follow up with your gynecologist Contact information: [address]  [address] [place] [address] [phone]       [facility], [place]. Go on 08/09/2020.   Specialty: General Surgery Why: @2pm . This is a nurse visit for staple removal. Please bring a copy of your photo ID and insurance card to the appointment. Please arrive 30 minutes prior to your appointment for paperwork.  Contact information: [address] [place] [phone]              Signed: [phone], [facility] Surgery 08/05/2020, 10:05 AM Please see Amion for pager number during day hours 7:00am-4:30pm

## 2020-08-25 ENCOUNTER — Other Ambulatory Visit: Payer: Self-pay | Admitting: Internal Medicine

## 2020-08-26 ENCOUNTER — Ambulatory Visit (INDEPENDENT_AMBULATORY_CARE_PROVIDER_SITE_OTHER): Payer: Self-pay | Admitting: Internal Medicine

## 2020-08-26 ENCOUNTER — Other Ambulatory Visit: Payer: Self-pay

## 2020-08-26 ENCOUNTER — Encounter: Payer: Self-pay | Admitting: Internal Medicine

## 2020-08-26 VITALS — BP 142/84 | HR 73 | Temp 98.2°F | Ht 65.0 in | Wt 240.0 lb

## 2020-08-26 DIAGNOSIS — R7303 Prediabetes: Secondary | ICD-10-CM

## 2020-08-26 DIAGNOSIS — N3946 Mixed incontinence: Secondary | ICD-10-CM

## 2020-08-26 DIAGNOSIS — J4520 Mild intermittent asthma, uncomplicated: Secondary | ICD-10-CM

## 2020-08-26 DIAGNOSIS — K56609 Unspecified intestinal obstruction, unspecified as to partial versus complete obstruction: Secondary | ICD-10-CM

## 2020-08-26 DIAGNOSIS — Z1159 Encounter for screening for other viral diseases: Secondary | ICD-10-CM

## 2020-08-26 DIAGNOSIS — H8113 Benign paroxysmal vertigo, bilateral: Secondary | ICD-10-CM

## 2020-08-26 DIAGNOSIS — Z0001 Encounter for general adult medical examination with abnormal findings: Secondary | ICD-10-CM

## 2020-08-26 DIAGNOSIS — I10 Essential (primary) hypertension: Secondary | ICD-10-CM

## 2020-08-26 DIAGNOSIS — K440 Diaphragmatic hernia with obstruction, without gangrene: Secondary | ICD-10-CM

## 2020-08-26 LAB — COMPREHENSIVE METABOLIC PANEL
ALT: 11 U/L (ref 0–35)
AST: 9 U/L (ref 0–37)
Albumin: 3.9 g/dL (ref 3.5–5.2)
Alkaline Phosphatase: 68 U/L (ref 39–117)
BUN: 13 mg/dL (ref 6–23)
CO2: 31 mEq/L (ref 19–32)
Calcium: 9.3 mg/dL (ref 8.4–10.5)
Chloride: 102 mEq/L (ref 96–112)
Creatinine, Ser: 0.65 mg/dL (ref 0.40–1.20)
GFR: 99.56 mL/min (ref >60.00)
Glucose, Bld: 93 mg/dL (ref 70–99)
Potassium: 4 mEq/L (ref 3.5–5.1)
Sodium: 141 mEq/L (ref 135–145)
Total Bilirubin: 0.4 mg/dL (ref 0.2–1.2)
Total Protein: 6.6 g/dL (ref 6.0–8.3)

## 2020-08-26 LAB — LIPID PANEL
Cholesterol: 164 mg/dL (ref 0–200)
HDL: 43.1 mg/dL (ref >39.00)
LDL Cholesterol: 95 mg/dL (ref 0–99)
NonHDL: 120.46
Total CHOL/HDL Ratio: 4
Triglycerides: 126 mg/dL (ref 0.0–149.0)
VLDL: 25.2 mg/dL (ref 0.0–40.0)

## 2020-08-26 LAB — CBC
HCT: 37.7 % (ref 36.0–46.0)
Hemoglobin: 12.4 g/dL (ref 12.0–15.0)
MCHC: 32.8 g/dL (ref 30.0–36.0)
MCV: 85.1 fl (ref 78.0–100.0)
Platelets: 208 10*3/uL (ref 150.0–400.0)
RBC: 4.44 Mil/uL (ref 3.87–5.11)
RDW: 14.7 % (ref 11.5–15.5)
WBC: 6.4 10*3/uL (ref 4.0–10.5)

## 2020-08-26 LAB — TSH: TSH: 1.75 u[IU]/mL (ref 0.35–4.50)

## 2020-08-26 MED ORDER — MECLIZINE HCL 25 MG PO TABS: 25.0000 mg | ORAL_TABLET | Freq: Three times a day (TID) | ORAL | 1 refills | Status: DC | PRN

## 2020-08-26 MED ORDER — BENZONATATE 100 MG PO CAPS: 100.0000 mg | ORAL_CAPSULE | Freq: Three times a day (TID) | ORAL | 0 refills | Status: DC | PRN

## 2020-08-26 NOTE — Patient Instructions (Signed)
Health Maintenance, Female Adopting a healthy lifestyle and getting preventive care are important in promoting health and wellness. Ask your health care provider about:  The right schedule for you to have regular tests and exams.  Things you can do on your own to prevent diseases and keep yourself healthy. What should I know about diet, weight, and exercise? Eat a healthy diet  Eat a diet that includes plenty of vegetables, fruits, low-fat dairy products, and lean protein.  Do not eat a lot of foods that are high in solid fats, added sugars, or sodium.   Maintain a healthy weight Body mass index (BMI) is used to identify weight problems. It estimates body fat based on height and weight. Your health care provider can help determine your BMI and help you achieve or maintain a healthy weight. Get regular exercise Get regular exercise. This is one of the most important things you can do for your health. Most adults should:  Exercise for at least 150 minutes each week. The exercise should increase your heart rate and make you sweat (moderate-intensity exercise).  Do strengthening exercises at least twice a week. This is in addition to the moderate-intensity exercise.  Spend less time sitting. Even light physical activity can be beneficial. Watch cholesterol and blood lipids Have your blood tested for lipids and cholesterol at 55 years of age, then have this test every 5 years. Have your cholesterol levels checked more often if:  Your lipid or cholesterol levels are high.  You are older than 55 years of age.  You are at high risk for heart disease. What should I know about cancer screening? Depending on your health history and family history, you may need to have cancer screening at various ages. This may include screening for:  Breast cancer.  Cervical cancer.  Colorectal cancer.  Skin cancer.  Lung cancer. What should I know about heart disease, diabetes, and high blood  pressure? Blood pressure and heart disease  High blood pressure causes heart disease and increases the risk of stroke. This is more likely to develop in people who have high blood pressure readings, are of African descent, or are overweight.  Have your blood pressure checked: ? Every 3-5 years if you are 18-39 years of age. ? Every year if you are 40 years old or older. Diabetes Have regular diabetes screenings. This checks your fasting blood sugar level. Have the screening done:  Once every three years after age 40 if you are at a normal weight and have a low risk for diabetes.  More often and at a younger age if you are overweight or have a high risk for diabetes. What should I know about preventing infection? Hepatitis B If you have a higher risk for hepatitis B, you should be screened for this virus. Talk with your health care provider to find out if you are at risk for hepatitis B infection. Hepatitis C Testing is recommended for:  Everyone born from 1945 through 1965.  Anyone with known risk factors for hepatitis C. Sexually transmitted infections (STIs)  Get screened for STIs, including gonorrhea and chlamydia, if: ? You are sexually active and are younger than 55 years of age. ? You are older than 55 years of age and your health care provider tells you that you are at risk for this type of infection. ? Your sexual activity has changed since you were last screened, and you are at increased risk for chlamydia or gonorrhea. Ask your health care provider   if you are at risk.  Ask your health care provider about whether you are at high risk for HIV. Your health care provider may recommend a prescription medicine to help prevent HIV infection. If you choose to take medicine to prevent HIV, you should first get tested for HIV. You should then be tested every 3 months for as long as you are taking the medicine. Pregnancy  If you are about to stop having your period (premenopausal) and  you may become pregnant, seek counseling before you get pregnant.  Take 400 to 800 micrograms (mcg) of folic acid every day if you become pregnant.  Ask for birth control (contraception) if you want to prevent pregnancy. Osteoporosis and menopause Osteoporosis is a disease in which the bones lose minerals and strength with aging. This can result in bone fractures. If you are 65 years old or older, or if you are at risk for osteoporosis and fractures, ask your health care provider if you should:  Be screened for bone loss.  Take a calcium or vitamin D supplement to lower your risk of fractures.  Be given hormone replacement therapy (HRT) to treat symptoms of menopause. Follow these instructions at home: Lifestyle  Do not use any products that contain nicotine or tobacco, such as cigarettes, e-cigarettes, and chewing tobacco. If you need help quitting, ask your health care provider.  Do not use street drugs.  Do not share needles.  Ask your health care provider for help if you need support or information about quitting drugs. Alcohol use  Do not drink alcohol if: ? Your health care provider tells you not to drink. ? You are pregnant, may be pregnant, or are planning to become pregnant.  If you drink alcohol: ? Limit how much you use to 0-1 drink a day. ? Limit intake if you are breastfeeding.  Be aware of how much alcohol is in your drink. In the [place] one drink equals one 12 oz bottle of beer (355 mL), one 5 oz glass of wine (148 mL), or one 1 oz glass of hard liquor (44 mL). General instructions  Schedule regular health, dental, and eye exams.  Stay current with your vaccines.  Tell your health care provider if: ? You often feel depressed. ? You have ever been abused or do not feel safe at home. Summary  Adopting a healthy lifestyle and getting preventive care are important in promoting health and wellness.  Follow your health care provider's instructions about healthy  diet, exercising, and getting tested or screened for diseases.  Follow your health care provider's instructions on monitoring your cholesterol and blood pressure. This information is not intended to replace advice given to you by your health care provider. Make sure you discuss any questions you have with your health care provider. Document Revised: 04/24/2018 Document Reviewed: 04/24/2018 Elsevier Patient Education  2021 Elsevier Inc.  

## 2020-08-26 NOTE — Assessment & Plan Note (Signed)
Avoid fluids after 7 pm Discussed voiding every 2 hours

## 2020-08-26 NOTE — Assessment & Plan Note (Signed)
Continue Albuterol as needed Tessalon refilled today

## 2020-08-26 NOTE — Progress Notes (Signed)
Subjective:    Patient ID: Kaitlyn Bray, female    DOB: 1965/09/03, 55 y.o.   MRN: [id number]  HPI  Pt presents to the clinic today for her annual exam. She is also due for hospital follow up and follow up of chronic conditions.  HTN: Her BP today is 142/84. She is taking Losartan HCT as prescribed. ECG from 03/2018 reviewed.  Asthma: Mild, intermittent. She reports intermittent cough but denies SOB. She uses Albuterol and Tessalon as needed. There are no PFT's on file. She does not follow with pulmonology.  BPPV: Intermittent symptoms, managed with Meclizine. She has been evaluated by ENT in the past.  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 Managed without meds. She is not following with urology.  Prediabetes: Recent A1c was 6.2%.  She is not taking any oral diabetic medication at this time.  She presented to the ER 3/17 with complaints of right lower quadrant abdominal pain, constipation, nausea, vomiting and poor p.o. intake.  CT scan of the abdomen/pelvis showed incarcerated hernia with small bowel obstruction.  Surgery was consulted and she was taken to the OR for exploratory laparotomy with hernia repair.  She had an NG tube placed for management of her SBO.  She was discharged on 3/24.  Since discharge, she denies nausea, vomiting, poor intake, constipation or diarrhea. She has followed up with the surgeon. She reports part of her incision has opened up. She has not noticed any drainage from the area. She has been trying to keep it cleaned and covered with a bandaid.  Flu: never Tetanus: 05/2017 COVID: Pfizer x 2 Pap smear: Hysterectomy Mammogram: 2016 Colon screening: never Vision screening: as needed Dentist: as needed  Diet: She does eat meat. She consumes fruits and veggies. She tries to avoid fried foods. She drinks mostly water, sweet tea. Exercise: None  Review of Systems  Past Medical History:  Diagnosis Date  . Asthma   . Hernia   . Hypertension      Current Outpatient Medications  Medication Sig Dispense Refill  . albuterol (VENTOLIN HFA) 108 (90 Base) MCG/ACT inhaler Inhale 1-2 puffs into the lungs every 6 (six) hours as needed for wheezing or shortness of breath. 18 g 1  . benzonatate (TESSALON) 100 MG capsule Take 1 capsule (100 mg total) by mouth 3 (three) times daily as needed for cough. 30 capsule 0  . cetirizine (ZYRTEC) 10 MG tablet Take 10 mg by mouth daily.    . cholecalciferol (VITAMIN D3) 25 MCG (1000 UNIT) tablet Take 5,000 Units by mouth daily.    . fluticasone (FLONASE) 50 MCG/ACT nasal spray Place into both nostrils daily.    Marland Kitchen losartan-hydrochlorothiazide (HYZAAR) 50-12.5 MG tablet Take 1 tablet by mouth daily. 90 tablet 0  . meclizine (ANTIVERT) 25 MG tablet Take 1 tablet (25 mg total) by mouth 3 (three) times daily as needed for dizziness or nausea. 30 tablet 1  . methocarbamol (ROBAXIN) 750 MG tablet Take 1 tablet (750 mg total) by mouth every 8 (eight) hours as needed for muscle spasms. 30 tablet 0  . ondansetron (ZOFRAN-ODT) 4 MG disintegrating tablet Take 1 tablet (4 mg total) by mouth every 6 (six) hours as needed for nausea. 20 tablet 0  . oxyCODONE (OXY IR/ROXICODONE) 5 MG immediate release tablet Take 1 tablet (5 mg total) by mouth every 6 (six) hours as needed for breakthrough pain. 15 tablet 0  . Vitamin D, Ergocalciferol, (DRISDOL) 1.25 MG (50000 UNIT) CAPS capsule Take 1 capsule (50,000  Units total) by mouth every 7 (seven) days. (Patient not taking: No sig reported) 12 capsule 0   No current facility-administered medications for this visit.    Allergies  Allergen Reactions  . Codeine     Fast heart rate  . Penicillins Rash  . Sulfa Drugs Cross Reactors Rash    Family History  Problem Relation Age of Onset  . Hypertension Mother   . Stroke Mother   . Dementia Mother   . Heart disease Mother   . Hyperlipidemia Mother   . COPD Father   . Arthritis Father   . Mental illness Father   . Heart  disease Maternal Grandmother   . Heart disease Maternal Grandfather   . Alzheimer's disease Maternal Grandfather   . Uterine cancer Paternal Grandmother     Social History   Socioeconomic History  . Marital status: Married    Spouse name: Not on file  . Number of children: Not on file  . Years of education: Not on file  . Highest education level: Not on file  Occupational History  . Not on file  Tobacco Use  . Smoking status: Never Smoker  . Smokeless tobacco: Never Used  Vaping Use  . Vaping Use: Never used  Substance and Sexual Activity  . Alcohol use: No  . Drug use: No  . Sexual activity: Not on file  Other Topics Concern  . Not on file  Social History Narrative  . Not on file   Social Determinants of Health   Financial Resource Strain: Not on file  Food Insecurity: Not on file  Transportation Needs: Not on file  Physical Activity: Not on file  Stress: Not on file  Social Connections: Not on file  Intimate Partner Violence: Not on file     Constitutional: Denies fever, malaise, fatigue, headache or abrupt weight changes.  HEENT: Denies eye pain, eye redness, ear pain, ringing in the ears, wax buildup, runny nose, nasal congestion, bloody nose, or sore throat. Respiratory: Pt reports intermittent cough. Denies difficulty breathing, shortness of breath, cough or sputum production.   Cardiovascular: Pt reports swelling in legs. Denies chest pain, chest tightness, palpitations or swelling in the hands.  Gastrointestinal: Denies abdominal pain, bloating, constipation, diarrhea or blood in the stool.  GU: Pt reports nocturia. Denies urgency, frequency, pain with urination, burning sensation, blood in urine, odor or discharge. Musculoskeletal: Denies decrease in range of motion, difficulty with gait, muscle pain or joint pain and swelling.  Skin: Pt reports opening of her incision. Denies redness, rashes, lesions or ulcercations.  Neurological: Patient reports  intermittent dizziness.  Denies difficulty with memory, difficulty with speech or problems with balance and coordination.  Psych: Denies anxiety, depression, SI/HI.  No other specific complaints in a complete review of systems (except as listed in HPI above).     Objective:   Physical Exam  BP (!) 142/84   Pulse 73   Temp 98.2 F (36.8 C) (Temporal)   Ht 5\' 5"  (1.651 m)   Wt 240 lb (108.9 kg)   SpO2 98%   BMI 39.94 kg/m   Wt Readings from Last 3 Encounters:  07/29/20 249 lb 4.8 oz (113.1 kg)  05/24/20 260 lb (117.9 kg)  06/23/19 256 lb 2 oz (116.2 kg)    General: Appears her stated age, obese, in NAD. Skin: Midline incision with 2.5 cm dehiscence noted inferior to the belly button. Incision to RLQ scabbed. HEENT: Head: normal shape and size; Eyes: sclera white, no icterus,  conjunctiva pink, PERRLA. Strabismus noted of left eye;  Neck:  Neck supple, trachea midline. No masses, lumps or thyromegaly present.  Cardiovascular: Normal rate and rhythm. S1,S2 noted.  No murmur, rubs or gallops noted. Trace pitting BLE edema. No carotid bruits noted. Pulmonary/Chest: Normal effort and positive vesicular breath sounds. No respiratory distress. No wheezes, rales or ronchi noted.  Abdomen: Soft and nontender. Normal bowel sounds. No distention or masses noted. Liver, spleen and kidneys non palpable. Musculoskeletal: Strength 5/5 BUE/BLE. No difficulty with gait.  Neurological: Alert and oriented. Cranial nerves II-XII grossly intact. Coordination normal.  Psychiatric: Mood and affect normal. Racing thoughts noted.   BMET    Component Value Date/Time   NA 136 08/04/2020 0411   K 3.7 08/04/2020 0411   CL 101 08/04/2020 0411   CO2 28 08/04/2020 0411   GLUCOSE 112 (H) 08/04/2020 0411   BUN 5 (L) 08/04/2020 0411   CREATININE 0.62 08/04/2020 0411   CALCIUM 8.8 (L) 08/04/2020 0411   GFRNONAA >60 08/04/2020 0411   GFRAA >60 04/05/2018 1031    Lipid Panel     Component Value  Date/Time   CHOL 189 03/20/2019 0901   TRIG 143.0 03/20/2019 0901   HDL 42.70 03/20/2019 0901   CHOLHDL 4 03/20/2019 0901   VLDL 28.6 03/20/2019 0901   LDLCALC 118 (H) 03/20/2019 0901    CBC    Component Value Date/Time   WBC 9.2 08/04/2020 0411   RBC 4.05 08/04/2020 0411   HGB 11.7 (L) 08/04/2020 0411   HCT 34.9 (L) 08/04/2020 0411   PLT 232 08/04/2020 0411   MCV 86.2 08/04/2020 0411   MCH 28.9 08/04/2020 0411   MCHC 33.5 08/04/2020 0411   RDW 13.6 08/04/2020 0411   LYMPHSABS 2.1 03/03/2013 1216   MONOABS 0.4 03/03/2013 1216   EOSABS 0.2 03/03/2013 1216   BASOSABS 0.1 03/03/2013 1216    Hgb A1C Lab Results  Component Value Date   HGBA1C 6.2 (H) 07/30/2020           Assessment & Plan:   Preventative Health Maintenance:  She declines flu shot Tetanus UTD Encouraged her to get her covid booster She no longer needs Pap smears She declines mammogram due to lack of insurance She declines colon screening due to lack of insurance Encouraged her to consume a balanced diet and exercise regimen Will check CBC, CMET, TSH, Lipid and Hep C today  Hospital follow-up for Incarcerated Hernia and Small Bowel Obstruction with Wound Dehiscence:  Hospital notes, labs and imaging reviewed Medications reviewed, no changes at this time Incisions painted with Betadine, covered with bandage She will follow-up with surgeon as needed  RTC in 1 year, sooner if needed  Nicki Reaper, NP This visit occurred during the SARS-CoV-2 public health emergency.  Safety protocols were in place, including screening questions prior to the visit, additional usage of staff PPE, and extensive cleaning of exam room while observing appropriate contact time as indicated for disinfecting solutions.

## 2020-08-26 NOTE — Assessment & Plan Note (Signed)
Meclizine refilled today 

## 2020-08-26 NOTE — Assessment & Plan Note (Signed)
Reasonable control Continue Losartan HCT Some edema- reinforced elevation, DASH diet and exercise for weight loss CMET today

## 2020-08-26 NOTE — Assessment & Plan Note (Signed)
Encouraged low carb diet and exercise for weight loss 

## 2020-08-27 LAB — HEPATITIS C ANTIBODY
Hepatitis C Ab: NONREACTIVE
SIGNAL TO CUT-OFF: 0.01 (ref <1.00)

## 2020-08-31 ENCOUNTER — Telehealth: Payer: Self-pay

## 2020-08-31 NOTE — Telephone Encounter (Signed)
Pt left v/m requesting cb about recent lab results. I spoke with pt; pt notified as instructed from lab result notes 08/26/20. Pt voiced understanding and appreciates the mailed copy. Nothing further needed at this time.

## 2021-02-24 ENCOUNTER — Encounter (HOSPITAL_COMMUNITY): Payer: Self-pay | Admitting: Emergency Medicine

## 2021-02-24 ENCOUNTER — Other Ambulatory Visit: Payer: Self-pay

## 2021-02-24 ENCOUNTER — Ambulatory Visit (HOSPITAL_COMMUNITY)
Admission: EM | Admit: 2021-02-24 | Discharge: 2021-02-24 | Disposition: A | Discharge status: Home / Self Care | Payer: Self-pay | Attending: Family Medicine | Admitting: Family Medicine

## 2021-02-24 DIAGNOSIS — M545 Low back pain, unspecified: Secondary | ICD-10-CM | POA: Insufficient documentation

## 2021-02-24 DIAGNOSIS — R739 Hyperglycemia, unspecified: Secondary | ICD-10-CM | POA: Insufficient documentation

## 2021-02-24 LAB — COMPREHENSIVE METABOLIC PANEL
ALT: 16 U/L (ref 0–44)
AST: 16 U/L (ref 15–41)
Albumin: 3.6 g/dL (ref 3.5–5.0)
Alkaline Phosphatase: 75 U/L (ref 38–126)
Anion gap: 8 (ref 5–15)
BUN: 11 mg/dL (ref 6–20)
CO2: 29 mmol/L (ref 22–32)
Calcium: 8.9 mg/dL (ref 8.9–10.3)
Chloride: 103 mmol/L (ref 98–111)
Creatinine, Ser: 0.63 mg/dL (ref 0.44–1.00)
GFR, Estimated: >60 mL/min (ref >60)
Glucose, Bld: 110 mg/dL — ABNORMAL HIGH (ref 70–99)
Potassium: 3.8 mmol/L (ref 3.5–5.1)
Sodium: 140 mmol/L (ref 135–145)
Total Bilirubin: 0.7 mg/dL (ref 0.3–1.2)
Total Protein: 6.7 g/dL (ref 6.5–8.1)

## 2021-02-24 LAB — POCT URINALYSIS DIPSTICK, ED / UC
Bilirubin Urine: NEGATIVE
Glucose, UA: NEGATIVE mg/dL
Hgb urine dipstick: NEGATIVE
Ketones, ur: NEGATIVE mg/dL
Leukocytes,Ua: NEGATIVE
Nitrite: NEGATIVE
Protein, ur: NEGATIVE mg/dL
Specific Gravity, Urine: 1.025 (ref 1.005–1.030)
Urobilinogen, UA: 0.2 mg/dL (ref 0.0–1.0)
pH: 6 (ref 5.0–8.0)

## 2021-02-24 LAB — HEMOGLOBIN A1C
Hgb A1c MFr Bld: 6.3 % — ABNORMAL HIGH (ref 4.8–5.6)
Mean Plasma Glucose: 134.11 mg/dL

## 2021-02-24 LAB — CBG MONITORING, ED: Glucose-Capillary: 126 mg/dL — ABNORMAL HIGH (ref 70–99)

## 2021-02-24 NOTE — Discharge Instructions (Signed)
You have had labs (blood work) drawn today. We will call you with any significant abnormalities or if there is need to begin or change treatment or pursue further follow up.  You may also review your test results online through MyChart. If you do not have a MyChart account, instructions to sign up should be on your discharge paperwork.  

## 2021-02-24 NOTE — ED Triage Notes (Signed)
Back pain worsened last night, left back.   Patient reports urinary changes for 2 days, getting up during the night to urinate, odor to urine, painful urination

## 2021-02-24 NOTE — ED Provider Notes (Signed)
[facility]   [id number] 02/24/21 Arrival Time: 4098  ASSESSMENT & PLAN:  1. Acute left-sided low back pain without sciatica   2. Hyperglycemia    Non-fasting blood sugar 126 here.  Pending:   COMPREHENSIVE METABOLIC PANEL  HEMOGLOBIN A1C   Discussed likely MSK back pain. Prefers OTC ibuprofen for the next few days. U/A normal.    Discharge Instructions      You have had labs (blood work) drawn today. We will call you with any significant abnormalities or if there is need to begin or change treatment or pursue further follow up.  You may also review your test results online through MyChart. If you do not have a MyChart account, instructions to sign up should be on your discharge paperwork.      Able to ambulate here and hemodynamically stable.  Recommend:  Follow-up Information     Schedule an appointment as soon as possible for a visit  with Lorre Munroe, NP.   Specialties: Internal Medicine, Emergency Medicine Contact information: [address] [place] [address] [phone]                 Reviewed expectations re: course of current medical issues. Questions answered. Outlined signs and symptoms indicating need for more acute intervention. Patient verbalized understanding. After Visit Summary given.   SUBJECTIVE: History from: patient.  Kaitlyn Bray is a 55 y.o. female who presents with complaint of non-radiating left flank/low back pain; grad onset; noted 2 d ago; more persistent now. No trauma. No gross hematuria. Reports nocturia multiple times per night; long-standing but worse recently. Occasional dysuria. Afebrile. Normal PO intake without n/v/d. No tx PTA.   OBJECTIVE:  Vitals:   02/24/21 1028  BP: (!) 169/83  Pulse: 88  Resp: (!) 22  Temp: 98 F (36.7 C)  TempSrc: Oral  SpO2: 95%    Recheck RR: 18  General appearance: alert; no distress HEENT: Cataract; AT Neck: supple with FROM; without midline tenderness CV:  regular Lungs: unlabored respirations; speaks full sentences without difficulty Abdomen: obese; healed midline scar form hernia repair; soft, non-tender; non-distended Back: mild  and poorly localized tenderness to palpation over LEFT lumbar paraspinal musculature ; FROM at waist; bruising: none; without midline tenderness Extremities: without edema; symmetrical without gross deformities; normal ROM of bilateral LE Skin: warm and dry Neurologic: normal gait; normal sensation and strength of bilateral LE Psychological: alert and cooperative; normal mood and affect  Labs: Results for orders placed or performed during the hospital encounter of 02/24/21  POCT Urinalysis Dipstick (ED/UC)  Result Value Ref Range   Glucose, UA NEGATIVE NEGATIVE mg/dL   Bilirubin Urine NEGATIVE NEGATIVE   Ketones, ur NEGATIVE NEGATIVE mg/dL   Specific Gravity, Urine 1.025 1.005 - 1.030   Hgb urine dipstick NEGATIVE NEGATIVE   pH 6.0 5.0 - 8.0   Protein, ur NEGATIVE NEGATIVE mg/dL   Urobilinogen, UA 0.2 0.0 - 1.0 mg/dL   Nitrite NEGATIVE NEGATIVE   Leukocytes,Ua NEGATIVE NEGATIVE  POC CBG monitoring  Result Value Ref Range   Glucose-Capillary 126 (H) 70 - 99 mg/dL   Labs Reviewed  CBG MONITORING, ED - Abnormal; Notable for the following components:      Result Value   Glucose-Capillary 126 (*)    All other components within normal limits  COMPREHENSIVE METABOLIC PANEL  HEMOGLOBIN A1C  POCT URINALYSIS DIPSTICK, ED / UC     Allergies  Allergen Reactions   Codeine     Fast heart  rate   Penicillins Rash   Sulfa Drugs Cross Reactors Rash    Past Medical History:  Diagnosis Date   Asthma    Hernia    Hypertension    Social History   Socioeconomic History   Marital status: Married    Spouse name: Not on file   Number of children: Not on file   Years of education: Not on file   Highest education level: Not on file  Occupational History   Not on file  Tobacco Use   Smoking status: Never    Smokeless tobacco: Never  Vaping Use   Vaping Use: Never used  Substance and Sexual Activity   Alcohol use: No   Drug use: No   Sexual activity: Not on file  Other Topics Concern   Not on file  Social History Narrative   Not on file   Social Determinants of Health   Financial Resource Strain: Not on file  Food Insecurity: Not on file  Transportation Needs: Not on file  Physical Activity: Not on file  Stress: Not on file  Social Connections: Not on file  Intimate Partner Violence: Not on file   Family History  Problem Relation Age of Onset   Hypertension Mother    Stroke Mother    Dementia Mother    Heart disease Mother    Hyperlipidemia Mother    COPD Father    Arthritis Father    Mental illness Father    Heart disease Maternal Grandmother    Heart disease Maternal Grandfather    Alzheimer's disease Maternal Grandfather    Uterine cancer Paternal Grandmother    Past Surgical History:  Procedure Laterality Date   ABDOMINAL HYSTERECTOMY     CESAREAN SECTION     LAPAROTOMY N/A 07/29/2020   Procedure: EXPLORATORY LAPAROTOMY, REPAIR OF VENTRAL HERNIA;  Surgeon: [name], MD;  Location: [facility] OR;  Service: General;  Laterality: N/A;   Alexis Frock, MD 02/24/21 1124

## 2021-03-24 ENCOUNTER — Other Ambulatory Visit: Payer: Self-pay | Admitting: General Surgery

## 2021-03-25 ENCOUNTER — Ambulatory Visit
Admission: RE | Admit: 2021-03-25 | Discharge: 2021-03-25 | Disposition: A | Discharge status: Home / Self Care | Payer: Self-pay | Source: Ambulatory Visit | Attending: General Surgery | Admitting: General Surgery

## 2021-03-25 ENCOUNTER — Other Ambulatory Visit: Payer: Self-pay | Admitting: General Surgery

## 2021-03-25 ENCOUNTER — Other Ambulatory Visit: Payer: Self-pay

## 2021-03-25 DIAGNOSIS — Z9889 Other specified postprocedural states: Secondary | ICD-10-CM

## 2021-03-25 DIAGNOSIS — Z8719 Personal history of other diseases of the digestive system: Secondary | ICD-10-CM

## 2021-05-24 ENCOUNTER — Ambulatory Visit
Admission: EM | Admit: 2021-05-24 | Discharge: 2021-05-24 | Disposition: A | Discharge status: Home / Self Care | Payer: Managed Care, Other (non HMO) | Attending: Family Medicine | Admitting: Family Medicine

## 2021-05-24 ENCOUNTER — Other Ambulatory Visit: Payer: Self-pay

## 2021-05-24 DIAGNOSIS — J069 Acute upper respiratory infection, unspecified: Secondary | ICD-10-CM

## 2021-05-24 DIAGNOSIS — J4521 Mild intermittent asthma with (acute) exacerbation: Secondary | ICD-10-CM | POA: Diagnosis not present

## 2021-05-24 DIAGNOSIS — Z1152 Encounter for screening for COVID-19: Secondary | ICD-10-CM

## 2021-05-24 MED ORDER — PROMETHAZINE-DM 6.25-15 MG/5ML PO SYRP: 5.0000 mL | ORAL_SOLUTION | Freq: Four times a day (QID) | ORAL | 0 refills | Status: DC | PRN

## 2021-05-24 MED ORDER — BENZONATATE 200 MG PO CAPS: 200.0000 mg | ORAL_CAPSULE | Freq: Three times a day (TID) | ORAL | 0 refills | Status: DC | PRN

## 2021-05-24 MED ORDER — DEXAMETHASONE SODIUM PHOSPHATE 10 MG/ML IJ SOLN
10.0000 mg | Freq: Once | INTRAMUSCULAR | Status: AC
  Administered 2021-05-24: 10 mg via INTRAMUSCULAR

## 2021-05-24 NOTE — ED Provider Notes (Signed)
[facility]    CSN: [id number] Arrival date & time: 05/24/21  1202      History   Chief Complaint Chief Complaint  Patient presents with   Cough   Sore Throat    HPI Kaitlyn Bray is a 56 y.o. female.   Presenting today with progressive symptoms over the course of the past 5 days to include cough, sore scratchy throat, congestion, chest tightness, wheezing.  Now the past few days having fever, body aches, fatigue as well.  She has been taking her albuterol inhaler, over-the-counter cold and congestion medications with minimal relief.  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  Negative home COVID test directly after onset.  History of asthma on albuterol as needed but usually gets a steroid when symptoms like this occur.   Past Medical History:  Diagnosis Date   Asthma    Hernia    Hypertension     Patient Active Problem List   Diagnosis Date Noted   Prediabetes 08/26/2020   Urinary incontinence 06/23/2019   Constipation 06/23/2019   BPPV (benign paroxysmal positional vertigo) 03/20/2019   HTN (hypertension) 04/02/2017   Asthma 04/02/2017    Past Surgical History:  Procedure Laterality Date   ABDOMINAL HYSTERECTOMY     CESAREAN SECTION     LAPAROTOMY N/A 07/29/2020   Procedure: EXPLORATORY LAPAROTOMY, REPAIR OF VENTRAL HERNIA;  Surgeon: Georganna Skeans, MD;  Location: [place];  Service: General;  Laterality: N/A;   MYOMECTOMY      OB History   No obstetric history on file.      Home Medications    Prior to Admission medications   Medication Sig Start Date End Date Taking? Authorizing Provider  benzonatate (TESSALON) 200 MG capsule Take 1 capsule (200 mg total) by mouth 3 (three) times daily as needed for cough. 05/24/21  Yes Volney American, PA-C  promethazine-dextromethorphan (PROMETHAZINE-DM) 6.25-15 MG/5ML syrup Take 5 mLs by mouth 4 (four) times daily as needed. 05/24/21  Yes Volney American, PA-C  albuterol  (VENTOLIN HFA) 108 (90 Base) MCG/ACT inhaler Inhale 1-2 puffs into the lungs every 6 (six) hours as needed for wheezing or shortness of breath. 05/24/20   Emerson Monte, FNP  losartan-hydrochlorothiazide (HYZAAR) 50-12.5 MG tablet TAKE 1 TABLET BY MOUTH EVERY DAY 08/26/20   Jearld Fenton, NP  meclizine (ANTIVERT) 25 MG tablet Take 1 tablet (25 mg total) by mouth 3 (three) times daily as needed for dizziness or nausea. 08/26/20   Jearld Fenton, NP    Family History Family History  Problem Relation Age of Onset   Hypertension Mother    Stroke Mother    Dementia Mother    Heart disease Mother    Hyperlipidemia Mother    COPD Father    Arthritis Father    Mental illness Father    Heart disease Maternal Grandmother    Heart disease Maternal Grandfather    Alzheimer's disease Maternal Grandfather    Uterine cancer Paternal Grandmother     Social History Social History   Tobacco Use   Smoking status: Never   Smokeless tobacco: Never  Vaping Use   Vaping Use: Never used  Substance Use Topics   Alcohol use: No   Drug use: No     Allergies   Codeine, Penicillins, and Sulfa drugs cross reactors   Review of Systems Review of Systems Per HPI  Physical Exam Triage Vital Signs ED Triage Vitals  Enc Vitals Group  BP 05/24/21 1434 136/86     Pulse Rate 05/24/21 1434 80     Resp 05/24/21 1434 20     Temp 05/24/21 1434 97.6 F (36.4 C)     Temp src --      SpO2 05/24/21 1434 94 %     Weight --      Height --      Head Circumference --      Peak Flow --      Pain Score 05/24/21 1432 0     Pain Loc --      Pain Edu? --      Excl. in [place]? --    No data found.  Updated Vital Signs BP 136/86    Pulse 80    Temp 97.6 F (36.4 C)    Resp 20    SpO2 94%   Visual Acuity Right Eye Distance:   Left Eye Distance:   Bilateral Distance:    Right Eye Near:   Left Eye Near:    Bilateral Near:     Physical Exam Vitals and nursing note reviewed.  Constitutional:       Appearance: Normal appearance.  HENT:     Head: Atraumatic.     Right Ear: Tympanic membrane and external ear normal.     Left Ear: Tympanic membrane and external ear normal.     Nose: Rhinorrhea present.     Mouth/Throat:     Mouth: Mucous membranes are moist.     Pharynx: Posterior oropharyngeal erythema present.  Eyes:     Extraocular Movements: Extraocular movements intact.     Conjunctiva/sclera: Conjunctivae normal.  Cardiovascular:     Rate and Rhythm: Normal rate and regular rhythm.     Heart sounds: Normal heart sounds.  Pulmonary:     Effort: Pulmonary effort is normal.     Breath sounds: Wheezing present. No rales.  Musculoskeletal:        General: Normal range of motion.     Cervical back: Normal range of motion and neck supple.  Skin:    General: Skin is warm and dry.  Neurological:     Mental Status: She is alert and oriented to person, place, and time.  Psychiatric:        Mood and Affect: Mood normal.        Thought Content: Thought content normal.     UC Treatments / Results  Labs (all labs ordered are listed, but only abnormal results are displayed) Labs Reviewed  COVID-19, FLU A+B NAA    EKG   Radiology No results found.  Procedures Procedures (including critical care time)  Medications Ordered in UC Medications  dexamethasone (DECADRON) injection 10 mg (10 mg Intramuscular Given 05/24/21 1459)    Initial Impression / Assessment and Plan / UC Course  I have reviewed the triage vital signs and the nursing notes.  Pertinent labs & imaging results that were available during my care of the patient were reviewed by me and considered in my medical decision making (see chart for details).     Asthma exacerbation secondary to viral illness, possibly COVID.  COVID and flu testing pending, will give IM Decadron for asthma exacerbation, Phenergan DM, Tessalon Perles.  Discussed for over-the-counter medications and home care.  Return for acutely  worsening symptoms.  Work note given.  Final Clinical Impressions(s) / UC Diagnoses   Final diagnoses:  Mild intermittent asthma with acute exacerbation  Viral URI with cough   Discharge Instructions  None    ED Prescriptions     Medication Sig Dispense Auth. Provider   promethazine-dextromethorphan (PROMETHAZINE-DM) 6.25-15 MG/5ML syrup Take 5 mLs by mouth 4 (four) times daily as needed. 100 mL Volney American, PA-C   benzonatate (TESSALON) 200 MG capsule Take 1 capsule (200 mg total) by mouth 3 (three) times daily as needed for cough. 20 capsule Volney American, [place]      PDMP not reviewed this encounter.   Volney American, [place] 05/24/21 1719

## 2021-05-24 NOTE — ED Triage Notes (Signed)
Pt presents with cough and scratchy throat that began this week, has h/o asthma

## 2021-05-25 LAB — COVID-19, FLU A+B NAA
Influenza A, NAA: NOT DETECTED
Influenza B, NAA: NOT DETECTED
SARS-CoV-2, NAA: NOT DETECTED

## 2021-06-12 ENCOUNTER — Other Ambulatory Visit: Payer: Self-pay

## 2021-06-12 ENCOUNTER — Encounter (HOSPITAL_COMMUNITY): Payer: Self-pay | Admitting: Emergency Medicine

## 2021-06-12 ENCOUNTER — Emergency Department (HOSPITAL_COMMUNITY): Payer: Commercial Managed Care - HMO

## 2021-06-12 ENCOUNTER — Emergency Department (HOSPITAL_COMMUNITY)
Admission: EM | Admit: 2021-06-12 | Discharge: 2021-06-12 | Disposition: A | Discharge status: Home / Self Care | Payer: Commercial Managed Care - HMO | Attending: Emergency Medicine | Admitting: Emergency Medicine

## 2021-06-12 ENCOUNTER — Ambulatory Visit
Admission: EM | Admit: 2021-06-12 | Discharge: 2021-06-12 | Disposition: A | Discharge status: Nursing Home (Includes ICF) | Payer: Managed Care, Other (non HMO)

## 2021-06-12 DIAGNOSIS — K4090 Unilateral inguinal hernia, without obstruction or gangrene, not specified as recurrent: Secondary | ICD-10-CM

## 2021-06-12 DIAGNOSIS — Z79899 Other long term (current) drug therapy: Secondary | ICD-10-CM | POA: Insufficient documentation

## 2021-06-12 DIAGNOSIS — R1031 Right lower quadrant pain: Secondary | ICD-10-CM | POA: Diagnosis not present

## 2021-06-12 DIAGNOSIS — R140 Abdominal distension (gaseous): Secondary | ICD-10-CM | POA: Insufficient documentation

## 2021-06-12 DIAGNOSIS — R109 Unspecified abdominal pain: Secondary | ICD-10-CM | POA: Diagnosis present

## 2021-06-12 DIAGNOSIS — W19XXXA Unspecified fall, initial encounter: Secondary | ICD-10-CM

## 2021-06-12 LAB — CBC
HCT: 42.5 % (ref 36.0–46.0)
Hemoglobin: 13.7 g/dL (ref 12.0–15.0)
MCH: 28.3 pg (ref 26.0–34.0)
MCHC: 32.2 g/dL (ref 30.0–36.0)
MCV: 87.8 fL (ref 80.0–100.0)
Platelets: 261 K/uL (ref 150–400)
RBC: 4.84 MIL/uL (ref 3.87–5.11)
RDW: 13.6 % (ref 11.5–15.5)
WBC: 10.6 K/uL — ABNORMAL HIGH (ref 4.0–10.5)
nRBC: 0 % (ref 0.0–0.2)

## 2021-06-12 LAB — COMPREHENSIVE METABOLIC PANEL WITH GFR
ALT: 18 U/L (ref 0–44)
AST: 12 U/L — ABNORMAL LOW (ref 15–41)
Albumin: 3.5 g/dL (ref 3.5–5.0)
Alkaline Phosphatase: 73 U/L (ref 38–126)
Anion gap: 8 (ref 5–15)
BUN: 14 mg/dL (ref 6–20)
CO2: 30 mmol/L (ref 22–32)
Calcium: 9 mg/dL (ref 8.9–10.3)
Chloride: 100 mmol/L (ref 98–111)
Creatinine, Ser: 0.72 mg/dL (ref 0.44–1.00)
GFR, Estimated: >60 mL/min (ref >60)
Glucose, Bld: 99 mg/dL (ref 70–99)
Potassium: 3.6 mmol/L (ref 3.5–5.1)
Sodium: 138 mmol/L (ref 135–145)
Total Bilirubin: 0.5 mg/dL (ref 0.3–1.2)
Total Protein: 6.7 g/dL (ref 6.5–8.1)

## 2021-06-12 LAB — URINALYSIS, ROUTINE W REFLEX MICROSCOPIC
Bilirubin Urine: NEGATIVE
Glucose, UA: NEGATIVE mg/dL
Hgb urine dipstick: NEGATIVE
Ketones, ur: NEGATIVE mg/dL
Leukocytes,Ua: NEGATIVE
Nitrite: NEGATIVE
Protein, ur: NEGATIVE mg/dL
Specific Gravity, Urine: 1.01 (ref 1.005–1.030)
pH: 6 (ref 5.0–8.0)

## 2021-06-12 LAB — LIPASE, BLOOD: Lipase: 27 U/L (ref 11–51)

## 2021-06-12 MED ORDER — IOHEXOL 350 MG/ML SOLN
80.0000 mL | Freq: Once | INTRAVENOUS | Status: AC | PRN
  Administered 2021-06-12: 80 mL via INTRAVENOUS

## 2021-06-12 NOTE — ED Triage Notes (Signed)
Patient states that she fell going down a ramp on Thursday Morning  Patient states she got out of bed Friday and she had pain across her breast bone  Patient states that the left side of her back from the middle to the lower back is hurting.   Patient states she tried ice and Advil, last dose at noon today

## 2021-06-12 NOTE — ED Triage Notes (Addendum)
Pt slipped and fell going down a ramp on Thursday.  Reports pain across mid and lower back and rib pain.  States when she lays back she also has pain across abd.  History of hernia that she was supposed to have follow-up eval of and states that she never did.  Now having constipation and SOB. Seen at [facility] and sent to ED.

## 2021-06-12 NOTE — ED Provider Notes (Signed)
[facility]    CSN: [id number] Arrival date & time: 06/12/21  1241      History   Chief Complaint Chief Complaint  Patient presents with   Back Pain    Rib and back pain injury from last week     HPI Kaitlyn Bray is a 56 y.o. female.  Presenting with back and rib pain following fall that occurred last week.  Medical history BPPV. States she was walking down a slippery ramp when she slipped and fell backwards. Now with four days of back pain, anterior rib pain, abd pain. States her hernia is bothering her and she is constipated. Denies n/v/d/c. Last BM was today but had to strain to pass this and it was very painful. Denies head trauma, LOC, headaches, dizziness, SOB, CP.  HPI  Past Medical History:  Diagnosis Date   Asthma    Hernia    Hypertension     Patient Active Problem List   Diagnosis Date Noted   Prediabetes 08/26/2020   Urinary incontinence 06/23/2019   Constipation 06/23/2019   BPPV (benign paroxysmal positional vertigo) 03/20/2019   HTN (hypertension) 04/02/2017   Asthma 04/02/2017    Past Surgical History:  Procedure Laterality Date   ABDOMINAL HYSTERECTOMY     CESAREAN SECTION     LAPAROTOMY N/A 07/29/2020   Procedure: EXPLORATORY LAPAROTOMY, REPAIR OF VENTRAL HERNIA;  Surgeon: [name], MD;  Location: [facility] OR;  Service: General;  Laterality: N/A;   MYOMECTOMY      OB History   No obstetric history on file.      Home Medications    Prior to Admission medications   Medication Sig Start Date End Date Taking? Authorizing Provider  albuterol (VENTOLIN HFA) 108 (90 Base) MCG/ACT inhaler Inhale 1-2 puffs into the lungs every 6 (six) hours as needed for wheezing or shortness of breath. 05/24/20   Durward Parcel, FNP  losartan-hydrochlorothiazide (HYZAAR) 50-12.5 MG tablet TAKE 1 TABLET BY MOUTH EVERY DAY 08/26/20   Lorre Munroe, NP    Family History Family History  Problem Relation Age of Onset   Hypertension Mother     Stroke Mother    Dementia Mother    Heart disease Mother    Hyperlipidemia Mother    COPD Father    Arthritis Father    Mental illness Father    Heart disease Maternal Grandmother    Heart disease Maternal Grandfather    Alzheimer's disease Maternal Grandfather    Uterine cancer Paternal Grandmother     Social History Social History   Tobacco Use   Smoking status: Never   Smokeless tobacco: Never  Vaping Use   Vaping Use: Never used  Substance Use Topics   Alcohol use: No   Drug use: No     Allergies   Codeine, Penicillins, and Sulfa drugs cross reactors   Review of Systems Review of Systems  Gastrointestinal:  Positive for abdominal pain and constipation.  All other systems reviewed and are negative.   Physical Exam Triage Vital Signs ED Triage Vitals  Enc Vitals Group     BP      Pulse      Resp      Temp      Temp src      SpO2      Weight      Height      Head Circumference      Peak Flow      Pain Score  Pain Loc      Pain Edu?      Excl. in [place]?    No data found.  Updated Vital Signs BP (!) 146/81 (BP Location: Right Arm)    Pulse 79    Temp 98.1 F (36.7 C) (Oral)    Resp 18    SpO2 96%   Visual Acuity Right Eye Distance:   Left Eye Distance:   Bilateral Distance:    Right Eye Near:   Left Eye Near:    Bilateral Near:     Physical Exam Vitals reviewed.  Constitutional:      General: She is not in acute distress.    Appearance: Normal appearance. She is not ill-appearing.  HENT:     Head: Normocephalic and atraumatic.     Mouth/Throat:     Mouth: Mucous membranes are moist.     Comments: Moist mucous membranes Eyes:     Extraocular Movements: Extraocular movements intact.     Pupils: Pupils are equal, round, and reactive to light.  Cardiovascular:     Rate and Rhythm: Normal rate and regular rhythm.     Heart sounds: Normal heart sounds.  Pulmonary:     Effort: Pulmonary effort is normal.     Breath sounds: Normal  breath sounds. No wheezing, rhonchi or rales.  Abdominal:     General: Bowel sounds are normal. There is no distension.     Palpations: Abdomen is soft. There is no mass.     Tenderness: There is abdominal tenderness in the right lower quadrant and epigastric area. There is no right CVA tenderness, left CVA tenderness, guarding or rebound.     Comments: Pendulous abdomen. Exam limited due to body habitus. There is tenderness epigastric and RLQ. Appears to be a large nonreducible hernia RLQ, but difficult to appreciate given pannus.  Skin:    General: Skin is warm.     Capillary Refill: Capillary refill takes less than 2 seconds.     Comments: Good skin turgor  Neurological:     General: No focal deficit present.     Mental Status: She is alert and oriented to person, place, and time.  Psychiatric:        Mood and Affect: Mood normal.        Behavior: Behavior normal.     UC Treatments / Results  Labs (all labs ordered are listed, but only abnormal results are displayed) Labs Reviewed - No data to display  EKG   Radiology No results found.  Procedures Procedures (including critical care time)  Medications Ordered in UC Medications - No data to display  Initial Impression / Assessment and Plan / UC Course  I have reviewed the triage vital signs and the nursing notes.  Pertinent labs & imaging results that were available during my care of the patient were reviewed by me and considered in my medical decision making (see chart for details).     This patient is a very pleasant 56 y.o. year old female presenting with multiple MSK complaints and abd pain following fall that occurred 4 days ago. She has a history of hernia, now with constipation and abd pain following the fall. Send to the ED via personal vehicle driven by husband..   Final Clinical Impressions(s) / UC Diagnoses   Final diagnoses:  Right lower quadrant abdominal pain  Non-recurrent unilateral inguinal hernia  without obstruction or gangrene  Fall, initial encounter     Discharge Instructions      -  Head to the ED for an abdominal scan. I'm worried about the abdominal pain, constipation, and hernia!     ED Prescriptions   None    PDMP not reviewed this encounter.   Hazel Sams, PA-C 06/12/21 1517

## 2021-06-12 NOTE — Discharge Instructions (Addendum)
-  Head to the ED for an abdominal scan. I'm worried about the abdominal pain, constipation, and hernia!

## 2021-06-12 NOTE — ED Provider Notes (Signed)
[facility] EMERGENCY DEPARTMENT Provider Note   CSN: [id number] Arrival date & time: 06/12/21  1737     History  Chief Complaint  Patient presents with   Back Pain   Abdominal Pain    Kaitlyn Bray is a 56 y.o. female.  56 year old female presents with abdominal pain and distention times several days.  States that she had a slip and fall few days ago.  Denies any radicular symptoms down her legs.  Has noted some increased abdominal distention.  No abdominal trauma.  Does have a history of abdominal wall hernia.  Was seen in urgent care for similar symptoms and sent here for further management.  She denies any emesis or fever.  States she has seen a Careers adviser in the past for her abdominal hernia.      Home Medications Prior to Admission medications   Medication Sig Start Date End Date Taking? Authorizing Provider  ibuprofen (ADVIL) 200 MG tablet Take 400 mg by mouth every 6 (six) hours as needed for mild pain.   Yes [provider]  albuterol (VENTOLIN HFA) 108 (90 Base) MCG/ACT inhaler Inhale 1-2 puffs into the lungs every 6 (six) hours as needed for wheezing or shortness of breath. 05/24/20   Avegno, Zachery Dakins, FNP  losartan-hydrochlorothiazide (HYZAAR) 50-12.5 MG tablet TAKE 1 TABLET BY MOUTH EVERY DAY Patient taking differently: Take 1 tablet by mouth daily. 08/26/20   Lorre Munroe, NP      Allergies    Codeine, Penicillins, and Sulfa drugs cross reactors    Review of Systems   Review of Systems  All other systems reviewed and are negative.  Physical Exam Updated Vital Signs BP (!) 184/88 (BP Location: Right Arm)    Pulse 84    Temp 98.7 F (37.1 C) (Oral)    Resp 18    SpO2 97%  Physical Exam Vitals and nursing note reviewed.  Constitutional:      General: She is not in acute distress.    Appearance: Normal appearance. She is well-developed. She is not toxic-appearing.  HENT:     Head: Normocephalic and atraumatic.  Eyes:      General: Lids are normal.     Conjunctiva/sclera: Conjunctivae normal.     Pupils: Pupils are equal, round, and reactive to light.  Neck:     Thyroid: No thyroid mass.     Trachea: No tracheal deviation.  Cardiovascular:     Rate and Rhythm: Normal rate and regular rhythm.     Heart sounds: Normal heart sounds. No murmur heard.   No gallop.  Pulmonary:     Effort: Pulmonary effort is normal. No respiratory distress.     Breath sounds: Normal breath sounds. No stridor. No decreased breath sounds, wheezing, rhonchi or rales.  Abdominal:     General: There is no distension.     Palpations: Abdomen is soft.     Tenderness: There is no abdominal tenderness. There is no rebound.    Musculoskeletal:        General: No tenderness. Normal range of motion.     Cervical back: Normal range of motion and neck supple.       Back:  Skin:    General: Skin is warm and dry.     Findings: No abrasion or rash.  Neurological:     Mental Status: She is alert and oriented to person, place, and time. Mental status is at baseline.     GCS: GCS eye subscore  is 4. GCS verbal subscore is 5. GCS motor subscore is 6.     Cranial Nerves: No cranial nerve deficit.     Sensory: No sensory deficit.     Motor: Motor function is intact.  Psychiatric:        Attention and Perception: Attention normal.        Speech: Speech normal.        Behavior: Behavior normal.    ED Results / Procedures / Treatments   Labs (all labs ordered are listed, but only abnormal results are displayed) Labs Reviewed  CBC - Abnormal; Notable for the following components:      Result Value   WBC 10.6 (*)    All other components within normal limits  LIPASE, BLOOD  COMPREHENSIVE METABOLIC PANEL  URINALYSIS, ROUTINE W REFLEX MICROSCOPIC    EKG None  Radiology DG Chest 2 View  Result Date: 06/12/2021 CLINICAL DATA:  Shortness of breath. EXAM: CHEST - 2 VIEW COMPARISON:  January 15, 2018. FINDINGS: The heart size and  mediastinal contours are within normal limits. Both lungs are clear. The visualized skeletal structures are unremarkable. IMPRESSION: No active cardiopulmonary disease. Electronically Signed   By: Lupita Raider M.D.   On: 06/12/2021 18:32    Procedures Procedures    Medications Ordered in ED Medications - No data to display  ED Course/ Medical Decision Making/ A&P                           Medical Decision Making Amount and/or Complexity of Data Reviewed Labs: ordered. Radiology: ordered.  Risk Prescription drug management.  Abdominal CT without acute findings.  No evidence of incarcerated hernia. Urinalysis negative for infection here.  Patient's labs are reassuring here.  No concern for intestinal obstruction.  Back pain is musculoskeletal after her fall.  Will discharge home        Final Clinical Impression(s) / ED Diagnoses Final diagnoses:  None    Rx / DC Orders ED Discharge Orders     None         Lorre Nick, MD 06/12/21 2249

## 2021-06-21 ENCOUNTER — Encounter: Payer: Self-pay | Admitting: Family

## 2021-08-17 NOTE — Pre-Procedure Instructions (Signed)
Surgical Instructions ? ? ? Your procedure is scheduled on Friday 08/26/21. ? ? Report to [facility] Main Entrance "A" at 05:30 A.M., then check in with the Admitting office. ? Call this number if you have problems the morning of surgery: ? [phone] ? ? If you have any questions prior to your surgery date call [phone]: Open Monday-Friday 8am-4pm ? ? ? Remember: ? Do not eat after midnight the night before your surgery ? ?You may drink clear liquids until 04:30 A.M. the morning of your surgery.   ?Clear liquids allowed are: Water, Non-Citrus Juices (without pulp), Carbonated Beverages, Clear Tea, Black Coffee ONLY (NO MILK, CREAM OR POWDERED CREAMER of any kind), and Gatorade ?  ? Take these medicines the morning of surgery with A SIP OF WATER:  ? albuterol (VENTOLIN HFA) inhaler- If needed ? ? ?As of today, STOP taking any Aspirin (unless otherwise instructed by your surgeon) Aleve, Naproxen, Ibuprofen, Motrin, Advil, Goody's, BC's, all herbal medications, fish oil, and all vitamins. ? ?         ?Do not wear jewelry or makeup ?Do not wear lotions, powders, perfumes/colognes, or deodorant. ?Do not shave 48 hours prior to surgery.  Men may shave face and neck. ?Do not bring valuables to the hospital. ?Do not wear nail polish, gel polish, artificial nails, or any other type of covering on natural nails (fingers and toes) ?If you have artificial nails or gel coating that need to be removed by a nail salon, please have this removed prior to surgery. Artificial nails or gel coating may interfere with anesthesia's ability to adequately monitor your vital signs. ? ?[facility] is not responsible for any belongings or valuables. .  ? ?Do NOT Smoke (Tobacco/Vaping)  24 hours prior to your procedure ? ?If you use a CPAP at night, you may bring your mask for your overnight stay. ?  ?Contacts, glasses, hearing aids, dentures or partials may not be worn into surgery, please bring cases for these belongings ?  ?For  patients admitted to the hospital, discharge time will be determined by your treatment team. ?  ?Patients discharged the day of surgery will not be allowed to drive home, and someone needs to stay with them for 24 hours. ? ? ?SURGICAL WAITING ROOM VISITATION ?Patients having surgery or a procedure in a hospital may have two support people. ?Children under the age of 52 must have an adult with them who is not the patient. ?They may stay in the waiting area during the procedure and may switch out with other visitors. If the patient needs to stay at the hospital during part of their recovery, the visitor guidelines for inpatient rooms apply. ? ?Please refer to the  website for the visitor guidelines for Inpatients (after your surgery is over and you are in a regular room).  ? ? ? ? ? ?Special instructions:   ? ?Oral Hygiene is also important to reduce your risk of infection.  Remember - BRUSH YOUR TEETH THE MORNING OF SURGERY WITH YOUR REGULAR TOOTHPASTE ? ? ?[facility]- Preparing For Surgery ? ?Before surgery, you can play an important role. Because skin is not sterile, your skin needs to be as free of germs as possible. You can reduce the number of germs on your skin by washing with CHG (chlorahexidine gluconate) Soap before surgery.  CHG is an antiseptic cleaner which kills germs and bonds with the skin to continue killing germs even after washing.   ? ? ?Please do  not use if you have an allergy to CHG or antibacterial soaps. If your skin becomes reddened/irritated stop using the CHG.  ?Do not shave (including legs and underarms) for at least 48 hours prior to first CHG shower. It is OK to shave your face. ? ?Please follow these instructions carefully. ?  ? ? Shower the NIGHT BEFORE SURGERY and the MORNING OF SURGERY with CHG Soap.  ? If you chose to wash your hair, wash your hair first as usual with your normal shampoo. After you shampoo, rinse your hair and body thoroughly to remove the shampoo.  Then  [employer] and genitals (private parts) with your normal soap and rinse thoroughly to remove soap. ? ?After that Use CHG Soap as you would any other liquid soap. You can apply CHG directly to the skin and wash gently with a scrungie or a clean washcloth.  ? ?Apply the CHG Soap to your body ONLY FROM THE NECK DOWN.  Do not use on open wounds or open sores. Avoid contact with your eyes, ears, mouth and genitals (private parts). Wash Face and genitals (private parts)  with your normal soap.  ? ?Wash thoroughly, paying special attention to the area where your surgery will be performed. ? ?Thoroughly rinse your body with warm water from the neck down. ? ?DO NOT shower/wash with your normal soap after using and rinsing off the CHG Soap. ? ?Pat yourself dry with a CLEAN TOWEL. ? ?Wear CLEAN PAJAMAS to bed the night before surgery ? ?Place CLEAN SHEETS on your bed the night before your surgery ? ?DO NOT SLEEP WITH PETS. ? ? ?Day of Surgery: ? ?Take a shower with CHG soap. ?Wear Clean/Comfortable clothing the morning of surgery ?Do not apply any deodorants/lotions.   ?Remember to brush your teeth WITH YOUR REGULAR TOOTHPASTE. ? ? ? ?If you received a COVID test during your pre-op visit  it is requested that you wear a mask when out in public, stay away from anyone that may not be feeling well and notify your surgeon if you develop symptoms. If you have been in contact with anyone that has tested positive in the last 10 days please notify you surgeon. ? ?  ?Please read over the following fact sheets that you were given.  ? ?

## 2021-08-19 ENCOUNTER — Other Ambulatory Visit: Payer: Self-pay

## 2021-08-19 ENCOUNTER — Encounter (HOSPITAL_COMMUNITY)
Admission: RE | Admit: 2021-08-19 | Discharge: 2021-08-19 | Disposition: A | Discharge status: Home / Self Care | Payer: Managed Care, Other (non HMO) | Source: Ambulatory Visit | Attending: General Surgery | Admitting: General Surgery

## 2021-08-19 ENCOUNTER — Encounter (HOSPITAL_COMMUNITY): Payer: Self-pay

## 2021-08-19 VITALS — BP 137/76 | HR 90 | Temp 98.4°F | Resp 17 | Ht 65.0 in | Wt 260.7 lb

## 2021-08-19 DIAGNOSIS — R7303 Prediabetes: Secondary | ICD-10-CM | POA: Insufficient documentation

## 2021-08-19 DIAGNOSIS — Z6841 Body Mass Index (BMI) 40.0 and over, adult: Secondary | ICD-10-CM | POA: Diagnosis not present

## 2021-08-19 DIAGNOSIS — R011 Cardiac murmur, unspecified: Secondary | ICD-10-CM | POA: Insufficient documentation

## 2021-08-19 DIAGNOSIS — J45909 Unspecified asthma, uncomplicated: Secondary | ICD-10-CM | POA: Insufficient documentation

## 2021-08-19 DIAGNOSIS — I358 Other nonrheumatic aortic valve disorders: Secondary | ICD-10-CM | POA: Insufficient documentation

## 2021-08-19 DIAGNOSIS — I10 Essential (primary) hypertension: Secondary | ICD-10-CM | POA: Insufficient documentation

## 2021-08-19 DIAGNOSIS — K432 Incisional hernia without obstruction or gangrene: Secondary | ICD-10-CM | POA: Diagnosis not present

## 2021-08-19 DIAGNOSIS — Z01818 Encounter for other preprocedural examination: Secondary | ICD-10-CM | POA: Diagnosis not present

## 2021-08-19 HISTORY — DX: Other specified postprocedural states: Z98.890

## 2021-08-19 HISTORY — DX: Other specified postprocedural states: R11.2

## 2021-08-19 HISTORY — DX: Other complications of anesthesia, initial encounter: T88.59XA

## 2021-08-19 HISTORY — DX: Cardiac murmur, unspecified: R01.1

## 2021-08-19 HISTORY — DX: Prediabetes: R73.03

## 2021-08-19 LAB — CBC
HCT: 40.3 % (ref 36.0–46.0)
Hemoglobin: 12.7 g/dL (ref 12.0–15.0)
MCH: 27.8 pg (ref 26.0–34.0)
MCHC: 31.5 g/dL (ref 30.0–36.0)
MCV: 88.2 fL (ref 80.0–100.0)
Platelets: 234 10*3/uL (ref 150–400)
RBC: 4.57 MIL/uL (ref 3.87–5.11)
RDW: 13.5 % (ref 11.5–15.5)
WBC: 11 10*3/uL — ABNORMAL HIGH (ref 4.0–10.5)
nRBC: 0 % (ref 0.0–0.2)

## 2021-08-19 LAB — BASIC METABOLIC PANEL
Anion gap: 8 (ref 5–15)
BUN: 12 mg/dL (ref 6–20)
CO2: 29 mmol/L (ref 22–32)
Calcium: 9 mg/dL (ref 8.9–10.3)
Chloride: 103 mmol/L (ref 98–111)
Creatinine, Ser: 0.75 mg/dL (ref 0.44–1.00)
GFR, Estimated: >60 mL/min (ref >60)
Glucose, Bld: 251 mg/dL — ABNORMAL HIGH (ref 70–99)
Potassium: 3.5 mmol/L (ref 3.5–5.1)
Sodium: 140 mmol/L (ref 135–145)

## 2021-08-19 NOTE — Progress Notes (Signed)
PCP - New provider at [facility] ?Cardiologist - Denies ? ?PPM/ICD - Denies ? ?Chest x-ray - 06/12/21 ?EKG - 08/19/21 ?Stress Test - Denies ?ECHO - 07/30/20 ?Cardiac Cath - Denies ? ?Sleep Study - Denies ? ?Patient states she is pre-diabetic. Does not check her sugars and is not on any meds. ? ?Blood Thinner Instructions: N/A ?Aspirin Instructions: N/A ? ?ERAS Protcol - Yes ?PRE-SURGERY Ensure or G2- No ? ?COVID TEST- N/A ? ? ?Anesthesia review: Yes, cardiac hx ? ?Patient denies shortness of breath, fever, cough and chest pain at PAT appointment ? ? ?All instructions explained to the patient, with a verbal understanding of the material. Patient agrees to go over the instructions while at home for a better understanding. Patient also instructed to self quarantine after being tested for COVID-19. The opportunity to ask questions was provided. ? ? ?

## 2021-08-22 NOTE — Progress Notes (Signed)
Anesthesia Chart Review: ? Case: [id number] Date/Time: 08/26/21 0715  ? Procedure: INCISIONAL HERNIA REPAIR WITH MESH  ? Anesthesia type: General  ? Pre-op diagnosis: INCISIONAL HERNIA  ? Location[facility] ROOM 02 / [facility] OR  ? Surgeons: [name], MD  ? ?  ? ? ?DISCUSSION: Patient is a 56 year old female scheduled for the above procedure. She had emergency repair of an incarcerated incisional hernia in [facility] 2022. The hernia has since recurred.  ? ?History includes never smoker, post-operative N/V, HTN, pre-diabetes, asthma, murmur (mild aortic valve sclerosis 07/2020 echo), TAH/RSO/appendectomy/excision of cyst from rectosigmoid colon (07/28/08), incarcerate incisional hernia/SBO (s/p exploratory lap, repair of hernia 07/29/20).  BMI is consistent with morbid obesity. ? ?She has a history of pre-diabetes based on 02/2021 A1c of 6.3%. PAT labs showed a glucose of 251, so will plan for day of surgery CBG. Anesthesia team to evaluate on the day of surgery.  ? ? ?VS: BP 137/76   Pulse 90   Temp 36.9 ?C (Oral)   Resp 17   Ht 5\' 5"  (1.651 m)   Wt 118.3 kg   SpO2 97%   BMI 43.38 kg/m?  ? ? ?PROVIDERS: ? , NP is listed as PCP [facility])  ? ? ?LABS: Preoperative labs noted. Non-fasting glucose was 251. Labs were drawn on 08/19/21, so now too late to add A1c, but A1c was in the pre-diabetes range on 02/24/21 (6.3%). Will order a CBG on arrival for surgery  ?(all labs ordered are listed, but only abnormal results are displayed) ? ?Labs Reviewed  ?BASIC METABOLIC PANEL - Abnormal; Notable for the following components:  ?    Result Value  ? Glucose, Bld 251 (*)   ? All other components within normal limits  ?CBC - Abnormal; Notable for the following components:  ? WBC 11.0 (*)   ? All other components within normal limits  ? ? ? ?IMAGES: ?CT Abd/pelvis 06/12/21: ?IMPRESSION: ?1. No evidence of bowel obstruction. No evidence of acute traumatic ?injury. ?2. Hepatic steatosis. ?  ?CXR  06/12/21: ?FINDINGS: ?The heart size and mediastinal contours are within normal limits. ?Both lungs are clear. The visualized skeletal structures are ?unremarkable. ?IMPRESSION: ?No active cardiopulmonary disease. ?  ? ?EKG: 08/19/21: ?Sinus rhythm ?Possible Left atrial enlargement ?Borderline ECG ?When compared with ECG of 05-Apr-2018 10:26, ?No significant change since last tracing ?Confirmed by 07-Apr-2018 [phone]) on 08/19/2021 1:50:29 PM ? ? ?CV: ?Echo 07/30/20: ?IMPRESSIONS  ? 1. Left ventricular ejection fraction, by estimation, is 60 to 65%. The  ?left ventricle has normal function. Left ventricular endocardial border  ?not optimally defined to evaluate regional wall motion. Left ventricular  ?diastolic parameters were normal.  ? 2. Right ventricular systolic function is normal. The right ventricular  ?size is mildly enlarged. Tricuspid regurgitation signal is inadequate for  ?assessing PA pressure.  ? 3. The mitral valve was not well visualized. No evidence of mitral valve  ?regurgitation.  ? 4. The aortic valve was not well visualized. Aortic valve regurgitation  ?is not visualized. Mild aortic valve sclerosis is present, with no  ?evidence of aortic valve stenosis.  ? 5. The inferior vena cava is normal in size with greater than 50%  ?respiratory variability, suggesting right atrial pressure of 3 mmHg.  ? ? ?Past Medical History:  ?Diagnosis Date  ? Asthma   ? Complication of anesthesia   ? Heart murmur   ? Hernia   ? Hypertension   ? PONV (postoperative  nausea and vomiting)   ? Pre-diabetes   ? ? ?Past Surgical History:  ?Procedure Laterality Date  ? ABDOMINAL HYSTERECTOMY    ? APPENDECTOMY    ? CESAREAN SECTION    ? LAPAROTOMY N/A 07/29/2020  ? Procedure: EXPLORATORY LAPAROTOMY, REPAIR OF VENTRAL HERNIA;  Surgeon: [name], MD;  Location: [facility] OR;  Service: General;  Laterality: N/A;  ? MYOMECTOMY    ? ? ?MEDICATIONS: ? albuterol (VENTOLIN HFA) 108 (90 Base) MCG/ACT inhaler  ? benzonatate  (TESSALON) 200 MG capsule  ? ibuprofen (ADVIL) 200 MG tablet  ? losartan-hydrochlorothiazide (HYZAAR) 50-12.5 MG tablet  ? ?No current facility-administered medications for this encounter.  ? ? ?Shonna Chock, PA-C ?Surgical Short Stay/Anesthesiology ?[facility] Phone [phone] ?[facility] Phone [phone] ?08/22/2021 1:22 PM ? ? ? ? ? ?

## 2021-08-22 NOTE — Anesthesia Preprocedure Evaluation (Addendum)
Anesthesia Evaluation  ?Patient identified by MRN, date of birth, ID band ?Patient awake ? ? ? ?Reviewed: ?Allergy & Precautions, NPO status , Patient's Chart, lab work & pertinent test results ? ?History of Anesthesia Complications ?(+) PONV and history of anesthetic complications ? ?Airway ?Mallampati: III ? ?TM Distance: >3 FB ?Neck ROM: Full ? ? ? Dental ? ?(+) Teeth Intact, Dental Advisory Given ?  ?Pulmonary ?asthma ,  ?  ?Pulmonary exam normal ? ? ? ? ? ? ? Cardiovascular ?hypertension, Pt. on medications ?Normal cardiovascular exam ? ? ?Echo 07/30/20: EF [id number], normal diastolic function, normal RVSF, mild AV sclerosis w/o stenosis ?  ?Neuro/Psych ?negative neurological ROS ?   ? GI/Hepatic ?negative GI ROS, Neg liver ROS,   ?Endo/Other  ?diabetes (pre-DM, A1c 6.3%)Morbid obesity ? Renal/GU ?negative Renal ROS  ?negative genitourinary ?  ?Musculoskeletal ?negative musculoskeletal ROS ?(+)  ? Abdominal ?  ?Peds ? Hematology ?negative hematology ROS ?(+)   ?Anesthesia Other Findings ? ? Reproductive/Obstetrics ? ?  ? ? ? ? ? ? ? ? ? ? ? ? ? ?  ?  ? ? ? ? ? ? ?Anesthesia Physical ?Anesthesia Plan ? ?ASA: 3 ? ?Anesthesia Plan: General  ? ?Post-op Pain Management: Tylenol PO (pre-op)* and Toradol IV (intra-op)*  ? ?Induction: Intravenous ? ?PONV Risk Score and Plan: 4 or greater and Ondansetron, Dexamethasone, Treatment may vary due to age or medical condition, Midazolam and Scopolamine patch - Pre-op ? ?Airway Management Planned: Oral ETT ? ?Additional Equipment: None ? ?Intra-op Plan:  ? ?Post-operative Plan: Extubation in OR ? ?Informed Consent: I have reviewed the patients History and Physical, chart, labs and discussed the procedure including the risks, benefits and alternatives for the proposed anesthesia with the patient or authorized representative who has indicated his/her understanding and acceptance.  ? ? ? ?Dental advisory given ? ?Plan Discussed with:  ? ?Anesthesia  Plan Comments: (PAT note written 08/22/2021 by Myra Gianotti, PA-C. ?)  ? ? ? ? ?Anesthesia Quick Evaluation ? ?

## 2021-08-26 ENCOUNTER — Encounter (HOSPITAL_COMMUNITY): Payer: Self-pay | Admitting: General Surgery

## 2021-08-26 ENCOUNTER — Inpatient Hospital Stay (HOSPITAL_COMMUNITY)
Admission: RE | Admit: 2021-08-26 | Discharge: 2021-09-05 | DRG: 354 | Disposition: A | Discharge status: Home / Self Care | Payer: Commercial Managed Care - HMO | Attending: General Surgery | Admitting: General Surgery

## 2021-08-26 ENCOUNTER — Encounter (HOSPITAL_COMMUNITY)
Admission: RE | Disposition: A | Discharge status: Home / Self Care | Payer: Self-pay | Source: Home / Self Care | Attending: General Surgery

## 2021-08-26 ENCOUNTER — Other Ambulatory Visit: Payer: Self-pay

## 2021-08-26 ENCOUNTER — Ambulatory Visit (HOSPITAL_BASED_OUTPATIENT_CLINIC_OR_DEPARTMENT_OTHER): Payer: Commercial Managed Care - HMO | Admitting: Anesthesiology

## 2021-08-26 ENCOUNTER — Ambulatory Visit (HOSPITAL_COMMUNITY): Payer: Commercial Managed Care - HMO | Admitting: Vascular Surgery

## 2021-08-26 DIAGNOSIS — Z885 Allergy status to narcotic agent status: Secondary | ICD-10-CM

## 2021-08-26 DIAGNOSIS — Z79899 Other long term (current) drug therapy: Secondary | ICD-10-CM

## 2021-08-26 DIAGNOSIS — K9189 Other postprocedural complications and disorders of digestive system: Secondary | ICD-10-CM | POA: Diagnosis not present

## 2021-08-26 DIAGNOSIS — Z8719 Personal history of other diseases of the digestive system: Secondary | ICD-10-CM

## 2021-08-26 DIAGNOSIS — Z820 Family history of epilepsy and other diseases of the nervous system: Secondary | ICD-10-CM

## 2021-08-26 DIAGNOSIS — R7303 Prediabetes: Secondary | ICD-10-CM | POA: Diagnosis present

## 2021-08-26 DIAGNOSIS — Z823 Family history of stroke: Secondary | ICD-10-CM

## 2021-08-26 DIAGNOSIS — K432 Incisional hernia without obstruction or gangrene: Principal | ICD-10-CM | POA: Diagnosis present

## 2021-08-26 DIAGNOSIS — Z882 Allergy status to sulfonamides status: Secondary | ICD-10-CM

## 2021-08-26 DIAGNOSIS — Z8249 Family history of ischemic heart disease and other diseases of the circulatory system: Secondary | ICD-10-CM

## 2021-08-26 DIAGNOSIS — E669 Obesity, unspecified: Secondary | ICD-10-CM | POA: Diagnosis present

## 2021-08-26 DIAGNOSIS — Z8261 Family history of arthritis: Secondary | ICD-10-CM

## 2021-08-26 DIAGNOSIS — K567 Ileus, unspecified: Secondary | ICD-10-CM | POA: Diagnosis not present

## 2021-08-26 DIAGNOSIS — Z825 Family history of asthma and other chronic lower respiratory diseases: Secondary | ICD-10-CM

## 2021-08-26 DIAGNOSIS — R252 Cramp and spasm: Secondary | ICD-10-CM | POA: Diagnosis not present

## 2021-08-26 DIAGNOSIS — Z83438 Family history of other disorder of lipoprotein metabolism and other lipidemia: Secondary | ICD-10-CM

## 2021-08-26 DIAGNOSIS — I10 Essential (primary) hypertension: Secondary | ICD-10-CM

## 2021-08-26 DIAGNOSIS — R112 Nausea with vomiting, unspecified: Secondary | ICD-10-CM | POA: Diagnosis not present

## 2021-08-26 DIAGNOSIS — E46 Unspecified protein-calorie malnutrition: Secondary | ICD-10-CM | POA: Diagnosis not present

## 2021-08-26 DIAGNOSIS — J45909 Unspecified asthma, uncomplicated: Secondary | ICD-10-CM | POA: Diagnosis present

## 2021-08-26 DIAGNOSIS — Z91040 Latex allergy status: Secondary | ICD-10-CM

## 2021-08-26 DIAGNOSIS — Z6841 Body Mass Index (BMI) 40.0 and over, adult: Secondary | ICD-10-CM

## 2021-08-26 DIAGNOSIS — Z880 Allergy status to penicillin: Secondary | ICD-10-CM

## 2021-08-26 HISTORY — DX: Personal history of other diseases of the digestive system: Z87.19

## 2021-08-26 HISTORY — PX: INCISIONAL HERNIA REPAIR: SHX193

## 2021-08-26 HISTORY — PX: INSERTION OF MESH: SHX5868

## 2021-08-26 LAB — GLUCOSE, CAPILLARY: Glucose-Capillary: 147 mg/dL — ABNORMAL HIGH (ref 70–99)

## 2021-08-26 SURGERY — REPAIR, HERNIA, INCISIONAL
Anesthesia: General | Site: Abdomen

## 2021-08-26 MED ORDER — FENTANYL CITRATE (PF) 100 MCG/2ML IJ SOLN
25.0000 ug | INTRAMUSCULAR | Status: DC | PRN
  Administered 2021-08-26 (×3): 50 ug via INTRAVENOUS

## 2021-08-26 MED ORDER — ONDANSETRON HCL 4 MG/2ML IJ SOLN: 4.0000 mg | Freq: Once | INTRAMUSCULAR | Status: DC | PRN

## 2021-08-26 MED ORDER — LOSARTAN POTASSIUM-HCTZ 50-12.5 MG PO TABS: 1.0000 | ORAL_TABLET | Freq: Every morning | ORAL | Status: DC

## 2021-08-26 MED ORDER — ACETAMINOPHEN 500 MG PO TABS: 1000.0000 mg | ORAL_TABLET | Freq: Once | ORAL | Status: AC

## 2021-08-26 MED ORDER — 0.9 % SODIUM CHLORIDE (POUR BTL) OPTIME
TOPICAL | Status: DC | PRN
  Administered 2021-08-26 (×2): 1000 mL

## 2021-08-26 MED ORDER — PROPOFOL 10 MG/ML IV BOLUS
INTRAVENOUS | Status: AC
  Filled 2021-08-26: qty 20

## 2021-08-26 MED ORDER — ROCURONIUM BROMIDE 10 MG/ML (PF) SYRINGE
PREFILLED_SYRINGE | INTRAVENOUS | Status: AC
  Filled 2021-08-26: qty 10

## 2021-08-26 MED ORDER — DIPHENHYDRAMINE HCL 25 MG PO CAPS: 25.0000 mg | ORAL_CAPSULE | Freq: Four times a day (QID) | ORAL | Status: DC | PRN

## 2021-08-26 MED ORDER — ONDANSETRON HCL 4 MG/2ML IJ SOLN
4.0000 mg | Freq: Four times a day (QID) | INTRAMUSCULAR | Status: DC | PRN
  Administered 2021-08-26 – 2021-09-02 (×12): 4 mg via INTRAVENOUS
  Filled 2021-08-26 (×12): qty 2

## 2021-08-26 MED ORDER — SCOPOLAMINE 1 MG/3DAYS TD PT72: 1.0000 | MEDICATED_PATCH | TRANSDERMAL | Status: DC

## 2021-08-26 MED ORDER — FENTANYL CITRATE (PF) 250 MCG/5ML IJ SOLN
INTRAMUSCULAR | Status: AC
  Filled 2021-08-26: qty 5

## 2021-08-26 MED ORDER — ONDANSETRON HCL 4 MG/2ML IJ SOLN
INTRAMUSCULAR | Status: AC
  Filled 2021-08-26: qty 2

## 2021-08-26 MED ORDER — AMISULPRIDE (ANTIEMETIC) 5 MG/2ML IV SOLN
INTRAVENOUS | Status: AC
  Filled 2021-08-26: qty 4

## 2021-08-26 MED ORDER — MIDAZOLAM HCL 2 MG/2ML IJ SOLN
INTRAMUSCULAR | Status: AC
  Filled 2021-08-26: qty 2

## 2021-08-26 MED ORDER — ROCURONIUM BROMIDE 10 MG/ML (PF) SYRINGE
PREFILLED_SYRINGE | INTRAVENOUS | Status: DC | PRN
  Administered 2021-08-26: 10 mg via INTRAVENOUS
  Administered 2021-08-26: 70 mg via INTRAVENOUS
  Administered 2021-08-26 (×2): 10 mg via INTRAVENOUS
  Administered 2021-08-26: 20 mg via INTRAVENOUS

## 2021-08-26 MED ORDER — IBUPROFEN 200 MG PO TABS
200.0000 mg | ORAL_TABLET | ORAL | Status: DC | PRN
  Administered 2021-08-29 – 2021-09-04 (×4): 200 mg via ORAL
  Filled 2021-08-26 (×4): qty 1

## 2021-08-26 MED ORDER — OXYCODONE HCL 5 MG/5ML PO SOLN: 5.0000 mg | Freq: Once | ORAL | Status: DC | PRN

## 2021-08-26 MED ORDER — ORAL CARE MOUTH RINSE: 15.0000 mL | Freq: Once | OROMUCOSAL | Status: AC

## 2021-08-26 MED ORDER — ALBUTEROL SULFATE (2.5 MG/3ML) 0.083% IN NEBU: 3.0000 mL | INHALATION_SOLUTION | Freq: Four times a day (QID) | RESPIRATORY_TRACT | Status: DC | PRN

## 2021-08-26 MED ORDER — ONDANSETRON HCL 4 MG/2ML IJ SOLN
INTRAMUSCULAR | Status: DC | PRN
  Administered 2021-08-26: 4 mg via INTRAVENOUS

## 2021-08-26 MED ORDER — CHLORHEXIDINE GLUCONATE 0.12 % MT SOLN
OROMUCOSAL | Status: AC
  Administered 2021-08-26: 15 mL via OROMUCOSAL
  Filled 2021-08-26: qty 15

## 2021-08-26 MED ORDER — HYDROMORPHONE HCL 1 MG/ML IJ SOLN
1.0000 mg | INTRAMUSCULAR | Status: DC | PRN
  Administered 2021-08-27 – 2021-08-30 (×12): 1 mg via INTRAVENOUS
  Filled 2021-08-26 (×12): qty 1

## 2021-08-26 MED ORDER — PROPOFOL 10 MG/ML IV BOLUS
INTRAVENOUS | Status: DC | PRN
  Administered 2021-08-26: 50 mg via INTRAVENOUS
  Administered 2021-08-26: 150 mg via INTRAVENOUS

## 2021-08-26 MED ORDER — LIDOCAINE 2% (20 MG/ML) 5 ML SYRINGE
INTRAMUSCULAR | Status: AC
  Filled 2021-08-26: qty 5

## 2021-08-26 MED ORDER — BUPIVACAINE-EPINEPHRINE (PF) 0.25% -1:200000 IJ SOLN
INTRAMUSCULAR | Status: AC
  Filled 2021-08-26: qty 30

## 2021-08-26 MED ORDER — BENZONATATE 100 MG PO CAPS: 200.0000 mg | ORAL_CAPSULE | Freq: Three times a day (TID) | ORAL | Status: DC | PRN

## 2021-08-26 MED ORDER — CHLORHEXIDINE GLUCONATE 0.12 % MT SOLN: 15.0000 mL | Freq: Once | OROMUCOSAL | Status: AC

## 2021-08-26 MED ORDER — ENOXAPARIN SODIUM 40 MG/0.4ML IJ SOSY
40.0000 mg | PREFILLED_SYRINGE | INTRAMUSCULAR | Status: DC
  Administered 2021-08-27 – 2021-09-05 (×10): 40 mg via SUBCUTANEOUS
  Filled 2021-08-26 (×10): qty 0.4

## 2021-08-26 MED ORDER — SCOPOLAMINE 1 MG/3DAYS TD PT72
MEDICATED_PATCH | TRANSDERMAL | Status: AC
  Administered 2021-08-26: 1.5 mg via TRANSDERMAL
  Filled 2021-08-26: qty 1

## 2021-08-26 MED ORDER — LOSARTAN POTASSIUM 50 MG PO TABS
50.0000 mg | ORAL_TABLET | Freq: Every day | ORAL | Status: DC
Start: 2021-08-26 — End: 2021-09-05
  Administered 2021-08-26 – 2021-09-05 (×11): 50 mg via ORAL
  Filled 2021-08-26 (×11): qty 1

## 2021-08-26 MED ORDER — CEFAZOLIN SODIUM-DEXTROSE 2-4 GM/100ML-% IV SOLN
2.0000 g | Freq: Once | INTRAVENOUS | Status: AC
  Administered 2021-08-26: 2 g via INTRAVENOUS
  Filled 2021-08-26: qty 100

## 2021-08-26 MED ORDER — MIDAZOLAM HCL 2 MG/2ML IJ SOLN
INTRAMUSCULAR | Status: DC | PRN
  Administered 2021-08-26: 2 mg via INTRAVENOUS

## 2021-08-26 MED ORDER — FENTANYL CITRATE (PF) 100 MCG/2ML IJ SOLN
INTRAMUSCULAR | Status: AC
  Filled 2021-08-26: qty 2

## 2021-08-26 MED ORDER — BUPIVACAINE-EPINEPHRINE 0.25% -1:200000 IJ SOLN
INTRAMUSCULAR | Status: DC | PRN
Start: 2021-08-26 — End: 2021-08-26
  Administered 2021-08-26: 30 mL

## 2021-08-26 MED ORDER — AMISULPRIDE (ANTIEMETIC) 5 MG/2ML IV SOLN
10.0000 mg | Freq: Once | INTRAVENOUS | Status: AC | PRN
  Administered 2021-08-26: 10 mg via INTRAVENOUS

## 2021-08-26 MED ORDER — LIDOCAINE 2% (20 MG/ML) 5 ML SYRINGE
INTRAMUSCULAR | Status: DC | PRN
  Administered 2021-08-26: 100 mg via INTRAVENOUS

## 2021-08-26 MED ORDER — LACTATED RINGERS IV SOLN
INTRAVENOUS | Status: DC
Start: 2021-08-26 — End: 2021-08-26

## 2021-08-26 MED ORDER — ACETAMINOPHEN 500 MG PO TABS
ORAL_TABLET | ORAL | Status: AC
  Administered 2021-08-26: 1000 mg via ORAL
  Filled 2021-08-26: qty 2

## 2021-08-26 MED ORDER — FENTANYL CITRATE (PF) 250 MCG/5ML IJ SOLN
INTRAMUSCULAR | Status: DC | PRN
  Administered 2021-08-26 (×3): 100 ug via INTRAVENOUS
  Administered 2021-08-26: 50 ug via INTRAVENOUS

## 2021-08-26 MED ORDER — DEXAMETHASONE SODIUM PHOSPHATE 10 MG/ML IJ SOLN
INTRAMUSCULAR | Status: DC | PRN
  Administered 2021-08-26: 5 mg via INTRAVENOUS

## 2021-08-26 MED ORDER — GLYCOPYRROLATE PF 0.2 MG/ML IJ SOSY
PREFILLED_SYRINGE | INTRAMUSCULAR | Status: AC
  Filled 2021-08-26: qty 1

## 2021-08-26 MED ORDER — ONDANSETRON 4 MG PO TBDP
4.0000 mg | ORAL_TABLET | Freq: Four times a day (QID) | ORAL | Status: DC | PRN
  Administered 2021-09-03: 4 mg via ORAL
  Filled 2021-08-26: qty 1

## 2021-08-26 MED ORDER — DEXAMETHASONE SODIUM PHOSPHATE 10 MG/ML IJ SOLN
INTRAMUSCULAR | Status: AC
  Filled 2021-08-26: qty 1

## 2021-08-26 MED ORDER — OXYCODONE HCL 5 MG PO TABS
5.0000 mg | ORAL_TABLET | ORAL | Status: DC | PRN
  Administered 2021-08-26 – 2021-08-29 (×6): 10 mg via ORAL
  Filled 2021-08-26 (×6): qty 2

## 2021-08-26 MED ORDER — METHOCARBAMOL 1000 MG/10ML IJ SOLN
1000.0000 mg | Freq: Three times a day (TID) | INTRAMUSCULAR | Status: DC
  Administered 2021-08-26 – 2021-09-05 (×28): 1000 mg via INTRAVENOUS
  Filled 2021-08-26 (×2): qty 10
  Filled 2021-08-26: qty 1000
  Filled 2021-08-26 (×11): qty 10
  Filled 2021-08-26 (×2): qty 1000
  Filled 2021-08-26 (×4): qty 10
  Filled 2021-08-26 (×2): qty 1000
  Filled 2021-08-26 (×14): qty 10

## 2021-08-26 MED ORDER — DIPHENHYDRAMINE HCL 50 MG/ML IJ SOLN: 25.0000 mg | Freq: Four times a day (QID) | INTRAMUSCULAR | Status: DC | PRN

## 2021-08-26 MED ORDER — DEXMEDETOMIDINE (PRECEDEX) IN NS 20 MCG/5ML (4 MCG/ML) IV SYRINGE
PREFILLED_SYRINGE | INTRAVENOUS | Status: DC | PRN
  Administered 2021-08-26: 4 ug via INTRAVENOUS
  Administered 2021-08-26 (×2): 8 ug via INTRAVENOUS

## 2021-08-26 MED ORDER — OXYCODONE HCL 5 MG PO TABS: 5.0000 mg | ORAL_TABLET | Freq: Once | ORAL | Status: DC | PRN

## 2021-08-26 MED ORDER — HYDROCHLOROTHIAZIDE 12.5 MG PO TABS
12.5000 mg | ORAL_TABLET | Freq: Every day | ORAL | Status: DC
Start: 2021-08-27 — End: 2021-09-05
  Administered 2021-08-27 – 2021-09-05 (×10): 12.5 mg via ORAL
  Filled 2021-08-26 (×10): qty 1

## 2021-08-26 MED ORDER — SUGAMMADEX SODIUM 200 MG/2ML IV SOLN
INTRAVENOUS | Status: DC | PRN
  Administered 2021-08-26: 200 mg via INTRAVENOUS

## 2021-08-26 SURGICAL SUPPLY — 48 items
BAG COUNTER SPONGE SURGICOUNT (BAG) ×2 IMPLANT
BIOPATCH RED 1 DISK 7.0 (GAUZE/BANDAGES/DRESSINGS) ×1 IMPLANT
BLADE CLIPPER SURG (BLADE) ×1 IMPLANT
CANISTER SUCT 3000ML PPV (MISCELLANEOUS) ×2 IMPLANT
CHLORAPREP W/TINT 26 (MISCELLANEOUS) ×2 IMPLANT
COVER SURGICAL LIGHT HANDLE (MISCELLANEOUS) ×2 IMPLANT
DERMABOND ADVANCED (GAUZE/BANDAGES/DRESSINGS) ×1
DERMABOND ADVANCED .7 DNX12 (GAUZE/BANDAGES/DRESSINGS) IMPLANT
DRAIN CHANNEL 19F RND (DRAIN) ×1 IMPLANT
DRAPE UNIVERSAL PACK (DRAPES) ×1 IMPLANT
DRSG TEGADERM 2-3/8X2-3/4 SM (GAUZE/BANDAGES/DRESSINGS) ×1 IMPLANT
ELECT REM PT RETURN 9FT ADLT (ELECTROSURGICAL) ×2
ELECTRODE REM PT RTRN 9FT ADLT (ELECTROSURGICAL) ×1 IMPLANT
EVACUATOR SILICONE 100CC (DRAIN) ×1 IMPLANT
GAUZE SPONGE 4X4 12PLY STRL (GAUZE/BANDAGES/DRESSINGS) IMPLANT
GLOVE BIOGEL PI IND STRL 8 (GLOVE) ×1 IMPLANT
GLOVE BIOGEL PI INDICATOR 8 (GLOVE) ×1
GLOVE SURG SS PI 8.0 STRL IVOR (GLOVE) ×1 IMPLANT
GOWN STRL REUS W/ TWL LRG LVL3 (GOWN DISPOSABLE) ×1 IMPLANT
GOWN STRL REUS W/ TWL XL LVL3 (GOWN DISPOSABLE) ×1 IMPLANT
GOWN STRL REUS W/TWL LRG LVL3 (GOWN DISPOSABLE) ×6
GOWN STRL REUS W/TWL XL LVL3 (GOWN DISPOSABLE) ×2
KIT BASIN OR (CUSTOM PROCEDURE TRAY) ×2 IMPLANT
KIT TURNOVER KIT B (KITS) ×2 IMPLANT
MESH VENTRALIGHT ST 7X9N (Mesh General) ×1 IMPLANT
NDL PRECISIONGLIDE 27X1.5 (NEEDLE) IMPLANT
NEEDLE PRECISIONGLIDE 27X1.5 (NEEDLE) ×2 IMPLANT
NS IRRIG 1000ML POUR BTL (IV SOLUTION) ×2 IMPLANT
PACK GENERAL/GYN (CUSTOM PROCEDURE TRAY) ×2 IMPLANT
PAD ARMBOARD 7.5X6 YLW CONV (MISCELLANEOUS) ×2 IMPLANT
PENCIL SMOKE EVACUATOR (MISCELLANEOUS) ×1 IMPLANT
STAPLER VISISTAT 35W (STAPLE) ×1 IMPLANT
SUT ETHILON 2 0 FS 18 (SUTURE) ×1 IMPLANT
SUT MNCRL AB 4-0 PS2 18 (SUTURE) ×3 IMPLANT
SUT NOVA 1 T20/GS 25DT (SUTURE) ×4 IMPLANT
SUT NOVA NAB GS-21 0 18 T12 DT (SUTURE) IMPLANT
SUT PROLENE 0 CT 2 (SUTURE) ×1 IMPLANT
SUT SILK 2 0 SH (SUTURE) ×1 IMPLANT
SUT SILK 2 0 TIES 10X30 (SUTURE) ×1 IMPLANT
SUT VIC AB 2-0 SH 27 (SUTURE) ×4
SUT VIC AB 2-0 SH 27XBRD (SUTURE) IMPLANT
SUT VIC AB 3-0 SH 27 (SUTURE) ×2
SUT VIC AB 3-0 SH 27X BRD (SUTURE) IMPLANT
SYR CONTROL 10ML LL (SYRINGE) ×1 IMPLANT
TOWEL GREEN STERILE (TOWEL DISPOSABLE) ×2 IMPLANT
TOWEL GREEN STERILE FF (TOWEL DISPOSABLE) ×2 IMPLANT
TRAY FOL W/BAG SLVR 16FR STRL (SET/KITS/TRAYS/PACK) IMPLANT
TRAY FOLEY W/BAG SLVR 16FR LF (SET/KITS/TRAYS/PACK) ×2

## 2021-08-26 NOTE — Transfer of Care (Signed)
Immediate Anesthesia Transfer of Care Note ? ?Patient: Kaitlyn Bray ? ?Procedure(s) Performed: INCISIONAL HERNIA REPAIR (Abdomen) ?INSERTION OF MESH (Abdomen) ? ?Patient Location: PACU ? ?Anesthesia Type:General ? ?Level of Consciousness: drowsy, patient cooperative and responds to stimulation ? ?Airway & Oxygen Therapy: Patient Spontanous Breathing and Patient connected to nasal cannula oxygen ? ?Post-op Assessment: Report given to RN, Post -op Vital signs reviewed and stable and Patient moving all extremities X 4 ? ?Post vital signs: Reviewed and stable ? ?Last Vitals:  ?Vitals Value Taken Time  ?BP 150/90 08/26/21 1055  ?Temp    ?Pulse 96 08/26/21 1058  ?Resp 18 08/26/21 1058  ?SpO2 97 % 08/26/21 1058  ?Vitals shown include unvalidated device data. ? ?Last Pain:  ?Vitals:  ? 08/26/21 0610  ?TempSrc:   ?PainSc: 0-No pain  ?   ? ?  ? ?Complications: No notable events documented. ?

## 2021-08-26 NOTE — H&P (Signed)
Kaitlyn Bray is an 56 y.o. female.   ?Chief Complaint: Recurrent incisional hernia ?HPI: Patient presents for repair of recurrent incisional hernia with mesh.  Symptoms have been similar since the last time I saw her in the office. ? ?Past Medical History:  ?Diagnosis Date  ? Asthma   ? Complication of anesthesia   ? Heart murmur   ? Hernia   ? Hypertension   ? PONV (postoperative nausea and vomiting)   ? Pre-diabetes   ? ? ?Past Surgical History:  ?Procedure Laterality Date  ? ABDOMINAL HYSTERECTOMY    ? APPENDECTOMY    ? CESAREAN SECTION    ? LAPAROTOMY N/A 07/29/2020  ? Procedure: EXPLORATORY LAPAROTOMY, REPAIR OF VENTRAL HERNIA;  Surgeon: [name], MD;  Location: [facility] OR;  Service: General;  Laterality: N/A;  ? MYOMECTOMY    ? ? ?Family History  ?Problem Relation Age of Onset  ? Hypertension Mother   ? Stroke Mother   ? Dementia Mother   ? Heart disease Mother   ? Hyperlipidemia Mother   ? COPD Father   ? Arthritis Father   ? Mental illness Father   ? Heart disease Maternal Grandmother   ? Heart disease Maternal Grandfather   ? Alzheimer's disease Maternal Grandfather   ? Uterine cancer Paternal Grandmother   ? ?Social History:  reports that she has never smoked. She has never used smokeless tobacco. She reports that she does not drink alcohol and does not use drugs. ? ?Allergies:  ?Allergies  ?Allergen Reactions  ? Codeine Other (See Comments)  ?  Fast heart rate  ? Latex Other (See Comments)  ?  Redness from bandaids  ? Penicillins Rash  ? Sulfa Drugs Cross Reactors Rash  ? ? ?Medications Prior to Admission  ?Medication Sig Dispense Refill  ? albuterol (VENTOLIN HFA) 108 (90 Base) MCG/ACT inhaler Inhale 1-2 puffs into the lungs every 6 (six) hours as needed for wheezing or shortness of breath. 18 g 1  ? benzonatate (TESSALON) 200 MG capsule Take 200 mg by mouth 3 (three) times daily as needed for cough.    ? ibuprofen (ADVIL) 200 MG tablet Take 200 mg by mouth every 4 (four) hours as needed for  headache.    ? losartan-hydrochlorothiazide (HYZAAR) 50-12.5 MG tablet TAKE 1 TABLET BY MOUTH EVERY DAY (Patient taking differently: Take 1 tablet by mouth every morning.) 90 tablet 3  ? ? ?No results found for this or any previous visit (from the past 48 hour(s)). ?No results found. ? ?Review of Systems ? ?Blood pressure (!) 149/75, pulse 69, temperature 98.1 ?F (36.7 ?C), temperature source Oral, resp. rate 17, height 5\' 5"  (1.651 m), weight 117.9 kg, SpO2 95 %. ?Physical Exam ?Constitutional:   ?   Appearance: She is obese.  ?HENT:  ?   Head: Normocephalic.  ?   Nose: Nose normal.  ?   Mouth/Throat:  ?   Mouth: Mucous membranes are moist.  ?Eyes:  ?   Comments: Gaze is disconjugate  ?Cardiovascular:  ?   Rate and Rhythm: Normal rate and regular rhythm.  ?   Pulses: Normal pulses.  ?   Heart sounds: Normal heart sounds.  ?Pulmonary:  ?   Effort: Pulmonary effort is normal.  ?   Breath sounds: Normal breath sounds.  ?Abdominal:  ?   General: Abdomen is flat.  ?   Palpations: Abdomen is soft.  ?   Hernia: A hernia is present.  ?   Comments: Lower midline  incisional hernia partially reduces  ?Musculoskeletal:     ?   General: Normal range of motion.  ?Skin: ?   General: Skin is warm.  ?Neurological:  ?   Mental Status: She is alert and oriented to person, place, and time.  ?Psychiatric:     ?   Mood and Affect: Mood normal.  ?  ? ?Assessment/Plan ?Recurrent incisional hernia -plan repair of recurrent incisional hernia with mesh.  Procedure, risks, and benefits again were discussed.  We discussed the expected postoperative course.  She is agreeable.  Plan 23-hour observation. ? ?Liz Malady, MD ?08/26/2021, 6:50 AM ? ? ? ?

## 2021-08-26 NOTE — TOC Initial Note (Signed)
Transition of Care (TOC) - Initial/Assessment Note  ? ? ?Patient Details  ?Name: Kaitlyn Bray ?MRN: [id number] ?Date of Birth: 04-Jan-1966 ? ?Transition of Care (TOC) CM/SW Contact:    ?Lockie Pares, RN ?Phone Number: ?08/26/2021, 1:01 PM ? ?Clinical Narrative:                 ? ?Transition of Care (TOC) Department has reviewed patient and no TOC needs have been identified at this time. We will continue to monitor patient advancement through Interdisciplinary progressions and if new patient needs arise, please place a consult.   ?  ?  ?  ? ? ?Patient Goals and CMS Choice ?  ?  ?  ? ?Expected Discharge Plan and Services ?  ?  ?  ?  ?  ?                ?  ?  ?  ?  ?  ?  ?  ?  ?  ?  ? ?Prior Living Arrangements/Services ?  ?  ?  ?       ?  ?  ?  ?  ? ?Activities of Daily Living ?  ?  ? ?Permission Sought/Granted ?  ?  ?   ?   ?   ?   ? ?Emotional Assessment ?  ?  ?  ?  ?  ?  ? ?Admission diagnosis:  S/P hernia repair [H70.263, Z87.19] ?Patient Active Problem List  ? Diagnosis Date Noted  ? S/P hernia repair 08/26/2021  ? Prediabetes 08/26/2020  ? Urinary incontinence 06/23/2019  ? Constipation 06/23/2019  ? BPPV (benign paroxysmal positional vertigo) 03/20/2019  ? HTN (hypertension) 04/02/2017  ? Asthma 04/02/2017  ? ?PCP:  Lorre Munroe, NP ?Pharmacy:   ?[id number] [place], [place] - [address] ?[address] ?[place] [address] ?Phone: [phone] Fax: [phone] ? ? ? ? ?Social Determinants of Health (SDOH) Interventions ?  ? ?Readmission Risk Interventions ?   ? View : No data to display.  ?  ?  ?  ? ? ? ?

## 2021-08-26 NOTE — Anesthesia Procedure Notes (Addendum)
Procedure Name: Intubation ?Date/Time: 08/26/2021 7:33 AM ?Performed by: Michele Rockers, CRNA ?Pre-anesthesia Checklist: Patient identified, Patient being monitored, Timeout performed, Emergency Drugs available and Suction available ?Patient Re-evaluated:Patient Re-evaluated prior to induction ?Oxygen Delivery Method: Circle system utilized ?Preoxygenation: Pre-oxygenation with 100% oxygen ?Induction Type: IV induction ?Ventilation: Mask ventilation without difficulty ?Laryngoscope Size: Mac and 3 ?Grade View: Grade I ?Tube type: Oral ?Tube size: 7.5 mm ?Number of attempts: 1 ?Airway Equipment and Method: Stylet ?Placement Confirmation: ETT inserted through vocal cords under direct vision, positive ETCO2 and breath sounds checked- equal and bilateral ?Secured at: 21 cm ?Tube secured with: Tape ?Dental Injury: Teeth and Oropharynx as per pre-operative assessment  ?Comments: Paramedic student- Lovie Chol, intubated with supervision ? ? ? ? ?

## 2021-08-26 NOTE — Plan of Care (Signed)
  Problem: Education: Goal: Knowledge of General Education information will improve Description Including pain rating scale, medication(s)/side effects and non-pharmacologic comfort measures Outcome: Progressing   Problem: Health Behavior/Discharge Planning: Goal: Ability to manage health-related needs will improve Outcome: Progressing   

## 2021-08-26 NOTE — Anesthesia Postprocedure Evaluation (Signed)
Anesthesia Post Note ? ?Patient: Kaitlyn Bray ? ?Procedure(s) Performed: INCISIONAL HERNIA REPAIR (Abdomen) ?INSERTION OF MESH (Abdomen) ? ?  ? ?Patient location during evaluation: PACU ?Anesthesia Type: General ?Level of consciousness: awake and alert ?Pain management: pain level controlled ?Vital Signs Assessment: post-procedure vital signs reviewed and stable ?Respiratory status: spontaneous breathing, nonlabored ventilation and respiratory function stable ?Cardiovascular status: blood pressure returned to baseline and stable ?Postop Assessment: no apparent nausea or vomiting ?Anesthetic complications: no ? ? ?No notable events documented. ? ?Last Vitals:  ?Vitals:  ? 08/26/21 1325 08/26/21 1346  ?BP: (!) 149/81 (!) 153/80  ?Pulse: 91 93  ?Resp: 17 16  ?Temp:  (!) 36.4 ?C  ?SpO2: 96% 97%  ?  ?Last Pain:  ?Vitals:  ? 08/26/21 1346  ?TempSrc: Oral  ?PainSc:   ? ? ?  ?  ?  ?  ?  ?  ? ?Lucretia Kern ? ? ? ? ?

## 2021-08-26 NOTE — Op Note (Addendum)
?  08/26/2021 ? ?10:34 AM ? ?PATIENT:  Kaitlyn Bray  56 y.o. female ? ?PRE-OPERATIVE DIAGNOSIS:  INCISIONAL HERNIA ? ?POST-OPERATIVE DIAGNOSIS:  INCISIONAL HERNIA, HERNIA DIMENSIONS 12X15CM ? ?PROCEDURE:  Procedure(s): ?LYSIS OF ADHESIONS 60 MINUTES ?INCISIONAL HERNIA REPAIR ?INSERTION OF MESH 22x17cm VENTRALEX ? ?SURGEON:  Surgeon(s): ?[name], MD ? ?ASSISTANTS: Benna Dunks, MD ? ?ANESTHESIA:   local and general ? ?EBL:  Total I/O ?In: 1100 [I.V.:1000; IV Piggyback:100] ?Out: 275 [Urine:200; Blood:75] ? ?BLOOD ADMINISTERED:none ? ?DRAINS: (1) Jackson-Pratt drain(s) with closed bulb suction in the R abd   ? ?SPECIMEN:  No Specimen ? ?DISPOSITION OF SPECIMEN:  N/A ? ?COUNTS:  YES ? ?DICTATION: .Dragon Dictation ?Procedure in detail: Informed consent was obtained.  She received intravenous antibiotics.  She was brought the operating room and general endotracheal anesthesia was administered by the anesthesia staff.  Her abdomen was prepped and draped in sterile fashion after the nurses placed a Foley.  We did a timeout procedure.  Midline incision was made excising the wide lower portion of her scar.  Subcutaneous tissues were dissected down and it was difficult to enter the abdomen due to a lot of adhesions.  I extended the incision above the umbilicus and we were able to gain entry into the abdomen there.  We then gradually took down adhesions.  We were able to slowly open the abdomen.  There were some loops of small bowel that were adherent densely to the sac.  The sac was left on the bowel and it was excised around it.  There were no enterotomies made.  A total lysis of adhesions was 60 minutes.  We were able to free up all of the bowel and return it back into the abdomen.  We then dissected out the fascial edge circumferentially.  We measured it and chose a 22 x 17 cm Ventralex mesh.  This was placed using an inlay technique using multiple interrupted #1 Novafil's.  The mesh laid nicely.  We were not able  to bring back the fascia over the top of the mesh.  We placed a 87 [place] drain and secured with nylon.  Subcutaneous tissues were irrigated and we obtained excellent hemostasis.  The wound was closed in layers with subcutaneous tissues approximated with interrupted 2-0 Vicryl.  The skin was then closed with running 4-0 Monocryl followed by Dermabond.  All counts were correct.  She tolerated the procedure well without apparent complication and was taken recovery in stable condition.  We placed an abdominal binder at the end of the case. ?I was personally present during the key and critical portions of this procedure and immediately available throughout the entire procedure, as documented in my operative note.  ?PATIENT DISPOSITION:  PACU - hemodynamically stable. ?  ?Delay start of Pharmacological VTE agent (>24hrs) due to surgical blood loss or risk of bleeding:  no ? ?[name], MD, MPH, FACS ?Pager: [phone]  ?4/14/202310:34 AM ? ? ? ? ? ? ? ? ? ? ? ? ?  ?

## 2021-08-27 LAB — CBC
HCT: 39 % (ref 36.0–46.0)
Hemoglobin: 12.7 g/dL (ref 12.0–15.0)
MCH: 28.2 pg (ref 26.0–34.0)
MCHC: 32.6 g/dL (ref 30.0–36.0)
MCV: 86.5 fL (ref 80.0–100.0)
Platelets: 243 10*3/uL (ref 150–400)
RBC: 4.51 MIL/uL (ref 3.87–5.11)
RDW: 13.8 % (ref 11.5–15.5)
WBC: 18.5 10*3/uL — ABNORMAL HIGH (ref 4.0–10.5)
nRBC: 0 % (ref 0.0–0.2)

## 2021-08-27 LAB — BASIC METABOLIC PANEL
Anion gap: 6 (ref 5–15)
BUN: 11 mg/dL (ref 6–20)
CO2: 27 mmol/L (ref 22–32)
Calcium: 8.7 mg/dL — ABNORMAL LOW (ref 8.9–10.3)
Chloride: 104 mmol/L (ref 98–111)
Creatinine, Ser: 0.67 mg/dL (ref 0.44–1.00)
GFR, Estimated: >60 mL/min (ref >60)
Glucose, Bld: 157 mg/dL — ABNORMAL HIGH (ref 70–99)
Potassium: 4 mmol/L (ref 3.5–5.1)
Sodium: 137 mmol/L (ref 135–145)

## 2021-08-27 MED ORDER — PROCHLORPERAZINE EDISYLATE 10 MG/2ML IJ SOLN: 10.0000 mg | Freq: Four times a day (QID) | INTRAMUSCULAR | Status: DC | PRN

## 2021-08-27 MED ORDER — LACTATED RINGERS IV SOLN: INTRAVENOUS | Status: DC

## 2021-08-27 MED ORDER — ALUM & MAG HYDROXIDE-SIMETH 200-200-20 MG/5ML PO SUSP
30.0000 mL | ORAL | Status: DC | PRN
  Administered 2021-08-28 – 2021-09-04 (×2): 30 mL via ORAL
  Filled 2021-08-27 (×2): qty 30

## 2021-08-27 NOTE — Progress Notes (Signed)
Patient ID: Kaitlyn Bray, female   DOB: 12/29/1965, 56 y.o.   MRN: [id number] ?1 Day Post-Op  ?  ?Subjective: ?Vomited yesterday PM but feels better ?ROS negative except as listed above. ?Objective: ?Vital signs in last 24 hours: ?Temp:  [97.5 ?F (36.4 ?C)-98.5 ?F (36.9 ?C)] 98.2 ?F (36.8 ?C) (04/15 0459) ?Pulse Rate:  [77-103] 81 (04/15 0459) ?Resp:  [15-19] 18 (04/15 0459) ?BP: (129-164)/(73-92) 133/75 (04/15 0459) ?SpO2:  [93 %-99 %] 96 % (04/15 0459) ?Last BM Date : 08/25/21 ? ?Intake/Output from previous day: ?04/14 0701 - 04/15 0700 ?In: 87 [P.O.:500; I.V.:1800; IV Piggyback:300] ?Out: 1410 [Urine:1100; Drains:235; Blood:75] ?Intake/Output this shift: ?No intake/output data recorded. ? ?General appearance: alert and cooperative ?Resp: clear to auscultation bilaterally ?GI: soft, incision CDI, JP SS ? ?Lab Results: ?CBC  ?Recent Labs  ?  08/27/21 ?[id number]  ?WBC 18.5*  ?HGB 12.7  ?HCT 39.0  ?PLT 243  ? ?BMET ?Recent Labs  ?  08/27/21 ?[id number]  ?NA 137  ?K 4.0  ?CL 104  ?CO2 27  ?GLUCOSE 157*  ?BUN 11  ?CREATININE 0.67  ?CALCIUM 8.7*  ? ?PT/INR ?No results for input(s): LABPROT, INR in the last 72 hours. ?ABG ?No results for input(s): PHART, HCO3 in the last 72 hours. ? ?Invalid input(s): PCO2, PO2 ? ?Studies/Results: ?No results found. ? ?Anti-infectives: ?Anti-infectives (From admission, onward)  ? ? Start     Dose/Rate Route Frequency Ordered Stop  ? 08/26/21 0645  ceFAZolin (ANCEF) IVPB 2g/100 mL premix       ? 2 g ?200 mL/hr over 30 Minutes Intravenous  Once 08/26/21 [id number] 08/26/21 0755  ? ?  ? ? ?Assessment/Plan: ?S/P repair recurrent incisional hernia with mesh 4/14 ?- advance diet ?- mobilize ?- continue binder ?VTE - lovenox ?Dispo - hopefully home tomorrow if tolerates diet and pain controlled ? LOS: 0 days  ? ? ?Georganna Skeans, MD, MPH, FACS ?Trauma & General Surgery ?Use [email or web address] to contact on call provider ? ?08/27/2021  ?

## 2021-08-27 NOTE — Plan of Care (Signed)
Pt has rested during overnight. Only received 1 dose of pain meds and 1 dose of nausea med. Pt had emesis after supper. But resolved with zofran. Pt did get up and ambulate to bsc at shift change. Abdominal binder in place.  ?Problem: Education: ?Goal: Knowledge of General Education information will improve ?Description: Including pain rating scale, medication(s)/side effects and non-pharmacologic comfort measures ?Outcome: Progressing ?  ?Problem: Health Behavior/Discharge Planning: ?Goal: Ability to manage health-related needs will improve ?Outcome: Progressing ?  ?Problem: Clinical Measurements: ?Goal: Ability to maintain clinical measurements within normal limits will improve ?Outcome: Progressing ?Goal: Will remain free from infection ?Outcome: Progressing ?Goal: Diagnostic test results will improve ?Outcome: Progressing ?Goal: Respiratory complications will improve ?Outcome: Progressing ?Goal: Cardiovascular complication will be avoided ?Outcome: Progressing ?  ?Problem: Activity: ?Goal: Risk for activity intolerance will decrease ?Outcome: Progressing ?  ?Problem: Nutrition: ?Goal: Adequate nutrition will be maintained ?Outcome: Progressing ?  ?Problem: Coping: ?Goal: Level of anxiety will decrease ?Outcome: Progressing ?  ?Problem: Elimination: ?Goal: Will not experience complications related to bowel motility ?Outcome: Progressing ?Goal: Will not experience complications related to urinary retention ?Outcome: Progressing ?  ?Problem: Pain Managment: ?Goal: General experience of comfort will improve ?Outcome: Progressing ?  ?Problem: Safety: ?Goal: Ability to remain free from injury will improve ?Outcome: Progressing ?  ?Problem: Skin Integrity: ?Goal: Risk for impaired skin integrity will decrease ?Outcome: Progressing ?  ?

## 2021-08-28 DIAGNOSIS — Z885 Allergy status to narcotic agent status: Secondary | ICD-10-CM | POA: Diagnosis not present

## 2021-08-28 DIAGNOSIS — Z825 Family history of asthma and other chronic lower respiratory diseases: Secondary | ICD-10-CM | POA: Diagnosis not present

## 2021-08-28 DIAGNOSIS — Z91040 Latex allergy status: Secondary | ICD-10-CM | POA: Diagnosis not present

## 2021-08-28 DIAGNOSIS — I10 Essential (primary) hypertension: Secondary | ICD-10-CM | POA: Diagnosis present

## 2021-08-28 DIAGNOSIS — Z9889 Other specified postprocedural states: Secondary | ICD-10-CM | POA: Diagnosis present

## 2021-08-28 DIAGNOSIS — R252 Cramp and spasm: Secondary | ICD-10-CM | POA: Diagnosis not present

## 2021-08-28 DIAGNOSIS — K9189 Other postprocedural complications and disorders of digestive system: Secondary | ICD-10-CM | POA: Diagnosis not present

## 2021-08-28 DIAGNOSIS — Z882 Allergy status to sulfonamides status: Secondary | ICD-10-CM | POA: Diagnosis not present

## 2021-08-28 DIAGNOSIS — R112 Nausea with vomiting, unspecified: Secondary | ICD-10-CM | POA: Diagnosis not present

## 2021-08-28 DIAGNOSIS — Z79899 Other long term (current) drug therapy: Secondary | ICD-10-CM | POA: Diagnosis not present

## 2021-08-28 DIAGNOSIS — Z880 Allergy status to penicillin: Secondary | ICD-10-CM | POA: Diagnosis not present

## 2021-08-28 DIAGNOSIS — J45909 Unspecified asthma, uncomplicated: Secondary | ICD-10-CM | POA: Diagnosis present

## 2021-08-28 DIAGNOSIS — Z823 Family history of stroke: Secondary | ICD-10-CM | POA: Diagnosis not present

## 2021-08-28 DIAGNOSIS — Z8249 Family history of ischemic heart disease and other diseases of the circulatory system: Secondary | ICD-10-CM | POA: Diagnosis not present

## 2021-08-28 DIAGNOSIS — R7303 Prediabetes: Secondary | ICD-10-CM | POA: Diagnosis present

## 2021-08-28 DIAGNOSIS — E669 Obesity, unspecified: Secondary | ICD-10-CM | POA: Diagnosis present

## 2021-08-28 DIAGNOSIS — K432 Incisional hernia without obstruction or gangrene: Secondary | ICD-10-CM | POA: Diagnosis present

## 2021-08-28 DIAGNOSIS — K567 Ileus, unspecified: Secondary | ICD-10-CM | POA: Diagnosis not present

## 2021-08-28 DIAGNOSIS — Z83438 Family history of other disorder of lipoprotein metabolism and other lipidemia: Secondary | ICD-10-CM | POA: Diagnosis not present

## 2021-08-28 DIAGNOSIS — Z8261 Family history of arthritis: Secondary | ICD-10-CM | POA: Diagnosis not present

## 2021-08-28 DIAGNOSIS — E46 Unspecified protein-calorie malnutrition: Secondary | ICD-10-CM | POA: Diagnosis not present

## 2021-08-28 DIAGNOSIS — Z820 Family history of epilepsy and other diseases of the nervous system: Secondary | ICD-10-CM | POA: Diagnosis not present

## 2021-08-28 DIAGNOSIS — Z6841 Body Mass Index (BMI) 40.0 and over, adult: Secondary | ICD-10-CM | POA: Diagnosis not present

## 2021-08-28 LAB — CBC
HCT: 40.6 % (ref 36.0–46.0)
Hemoglobin: 12.8 g/dL (ref 12.0–15.0)
MCH: 27.8 pg (ref 26.0–34.0)
MCHC: 31.5 g/dL (ref 30.0–36.0)
MCV: 88.1 fL (ref 80.0–100.0)
Platelets: 241 10*3/uL (ref 150–400)
RBC: 4.61 MIL/uL (ref 3.87–5.11)
RDW: 14.2 % (ref 11.5–15.5)
WBC: 15 10*3/uL — ABNORMAL HIGH (ref 4.0–10.5)
nRBC: 0 % (ref 0.0–0.2)

## 2021-08-28 LAB — BASIC METABOLIC PANEL
Anion gap: 7 (ref 5–15)
BUN: 11 mg/dL (ref 6–20)
CO2: 29 mmol/L (ref 22–32)
Calcium: 8.3 mg/dL — ABNORMAL LOW (ref 8.9–10.3)
Chloride: 100 mmol/L (ref 98–111)
Creatinine, Ser: 0.68 mg/dL (ref 0.44–1.00)
GFR, Estimated: >60 mL/min (ref >60)
Glucose, Bld: 127 mg/dL — ABNORMAL HIGH (ref 70–99)
Potassium: 3.7 mmol/L (ref 3.5–5.1)
Sodium: 136 mmol/L (ref 135–145)

## 2021-08-28 NOTE — Progress Notes (Signed)
Patient ID: Kaitlyn Bray, female   DOB: [birth year], 56 y.o.   MRN: [id number] ?2 Days Post-Op  ?  ?Subjective: ?Vomited yesterday evening then earlier this AM ?ROS negative except as listed above. ?Objective: ?Vital signs in last 24 hours: ?Temp:  [97.8 ?F (36.6 ?C)-99.1 ?F (37.3 ?C)] 99.1 ?F (37.3 ?C) (04/15 2006) ?Pulse Rate:  [83-93] 93 (04/15 2006) ?Resp:  [18] 18 (04/15 2006) ?BP: (132-161)/(78-91) 153/83 (04/15 2006) ?SpO2:  [93 %-97 %] 93 % (04/15 2006) ?Last BM Date : 08/25/21 ? ?Intake/Output from previous day: ?04/15 0701 - 04/16 0700 ?In: 300 [P.O.:300] ?Out: 160 [Drains:160] ?Intake/Output this shift: ?No intake/output data recorded. ? ?General appearance: alert and cooperative ?Resp: clear to auscultation bilaterally ?GI: soft, incision CDI, JP SS ?Extremities: calves soft ? ?Lab Results: ?CBC  ?Recent Labs  ?  08/27/21 ?6226 08/28/21 ?0118  ?WBC 18.5* 15.0*  ?HGB 12.7 12.8  ?HCT 39.0 40.6  ?PLT 243 241  ? ?BMET ?Recent Labs  ?  08/27/21 ?3335 08/28/21 ?0118  ?NA 137 136  ?K 4.0 3.7  ?CL 104 100  ?CO2 27 29  ?GLUCOSE 157* 127*  ?BUN 11 11  ?CREATININE 0.67 0.68  ?CALCIUM 8.7* 8.3*  ? ?PT/INR ?No results for input(s): LABPROT, INR in the last 72 hours. ?ABG ?No results for input(s): PHART, HCO3 in the last 72 hours. ? ?Invalid input(s): PCO2, PO2 ? ?Studies/Results: ?No results found. ? ?Anti-infectives: ?Anti-infectives (From admission, onward)  ? ? Start     Dose/Rate Route Frequency Ordered Stop  ? 08/26/21 0645  ceFAZolin (ANCEF) IVPB 2g/100 mL premix       ? 2 g ?200 mL/hr over 30 Minutes Intravenous  Once 08/26/21 4562 08/26/21 0755  ? ?  ? ? ?Assessment/Plan: ?S/P repair recurrent incisional hernia with mesh 4/14 ?- ileus - back down to clears ?- mobilize ?- continue binder ?VTE - lovenox ?Dispo - AROBF ? LOS: 0 days  ? ? ?[name], MD, MPH, FACS ?Trauma & General Surgery ?Use [email or web address] to contact on call provider ? ?08/28/2021  ?

## 2021-08-28 NOTE — Plan of Care (Signed)
Pt alert and oriented x 4. Pt given zofran x2 and maalox x 1. Dilaudid x 2. Pt had 1 large episode of vomiting this am approx 0430. Spoke to pt regarding ambulation and increasing mobility to increase bowel function. After encouragement pt agreed to walk. Informed that she needed to get up to BR and not use purewick due to mobility and bowel motility. Pt ambulated 200 ft this am and tolerated. Pt had gas belching with walking. Pt reported she has not had bm or had flatulence since surgery. Encourage pt to walk after breakfast lunch and supper.  ?Problem: Nutrition: ?Goal: Adequate nutrition will be maintained ?Outcome: Not Progressing ?  ?Problem: Education: ?Goal: Knowledge of General Education information will improve ?Description: Including pain rating scale, medication(s)/side effects and non-pharmacologic comfort measures ?Outcome: Progressing ?  ?Problem: Health Behavior/Discharge Planning: ?Goal: Ability to manage health-related needs will improve ?Outcome: Progressing ?  ?Problem: Clinical Measurements: ?Goal: Ability to maintain clinical measurements within normal limits will improve ?Outcome: Progressing ?Goal: Will remain free from infection ?Outcome: Progressing ?Goal: Diagnostic test results will improve ?Outcome: Progressing ?Goal: Respiratory complications will improve ?Outcome: Progressing ?Goal: Cardiovascular complication will be avoided ?Outcome: Progressing ?  ?Problem: Activity: ?Goal: Risk for activity intolerance will decrease ?Outcome: Progressing ?  ?Problem: Coping: ?Goal: Level of anxiety will decrease ?Outcome: Progressing ?  ?Problem: Elimination: ?Goal: Will not experience complications related to bowel motility ?Outcome: Progressing ?Goal: Will not experience complications related to urinary retention ?Outcome: Progressing ?  ?Problem: Pain Managment: ?Goal: General experience of comfort will improve ?Outcome: Progressing ?  ?Problem: Safety: ?Goal: Ability to remain free from injury  will improve ?Outcome: Progressing ?  ?Problem: Skin Integrity: ?Goal: Risk for impaired skin integrity will decrease ?Outcome: Progressing ?  ?

## 2021-08-28 NOTE — Progress Notes (Signed)
This nurse attempted to ambulate patient to which she responded "I'm too tired right now." Patient tells this nurse that she ambulated around room and sat in recliner. Patient currently laying in bed.  ?

## 2021-08-29 ENCOUNTER — Encounter (HOSPITAL_COMMUNITY): Payer: Self-pay | Admitting: General Surgery

## 2021-08-29 MED ORDER — TRAMADOL HCL 50 MG PO TABS
100.0000 mg | ORAL_TABLET | Freq: Four times a day (QID) | ORAL | Status: DC | PRN
  Administered 2021-08-29 – 2021-09-02 (×13): 100 mg via ORAL
  Filled 2021-08-29 (×14): qty 2

## 2021-08-29 NOTE — Progress Notes (Signed)
3 Days Post-Op  ?  ?Subjective: ?No vomiting, passing some gas ?ROS negative except as listed above. ?Objective: ?Vital signs in last 24 hours: ?Temp:  [97.9 ?F (36.6 ?C)-98.2 ?F (36.8 ?C)] 98.1 ?F (36.7 ?C) (04/17 0437) ?Pulse Rate:  [79-91] 91 (04/17 0437) ?Resp:  [16-18] 17 (04/17 0437) ?BP: (120-151)/(77-86) 150/85 (04/17 0437) ?SpO2:  [93 %-97 %] 95 % (04/17 0437) ?Last BM Date : 08/25/21 ? ?Intake/Output from previous day: ?04/16 0701 - 04/17 0700 ?In: 4191.7 [P.O.:420; I.V.:3171.7; IV Piggyback:600] ?Out: 215 [Drains:215] ?Intake/Output this shift: ?No intake/output data recorded. ? ?General appearance: alert and cooperative ?Resp: clear to auscultation bilaterally ?GI: soft, incision CDI, JP SS ? ?Lab Results: ?CBC  ?Recent Labs  ?  08/27/21 ?9480 08/28/21 ?0118  ?WBC 18.5* 15.0*  ?HGB 12.7 12.8  ?HCT 39.0 40.6  ?PLT 243 241  ? ?BMET ?Recent Labs  ?  08/27/21 ?1655 08/28/21 ?0118  ?NA 137 136  ?K 4.0 3.7  ?CL 104 100  ?CO2 27 29  ?GLUCOSE 157* 127*  ?BUN 11 11  ?CREATININE 0.67 0.68  ?CALCIUM 8.7* 8.3*  ? ?PT/INR ?No results for input(s): LABPROT, INR in the last 72 hours. ?ABG ?No results for input(s): PHART, HCO3 in the last 72 hours. ? ?Invalid input(s): PCO2, PO2 ? ?Studies/Results: ?No results found. ? ?Anti-infectives: ?Anti-infectives (From admission, onward)  ? ? Start     Dose/Rate Route Frequency Ordered Stop  ? 08/26/21 0645  ceFAZolin (ANCEF) IVPB 2g/100 mL premix       ? 2 g ?200 mL/hr over 30 Minutes Intravenous  Once 08/26/21 3748 08/26/21 0755  ? ?  ? ? ?Assessment/Plan: ?S/P repair recurrent incisional hernia with mesh 4/14 ?- ileus - improving, advance diet ?- try tramadol instead of oxy (causing HB and bad taste) ?- mobilize ?- continue binder ?VTE - lovenox ?Dispo - AROBF ? LOS: 1 day  ? ? ?[name], MD, MPH, FACS ?Trauma & General Surgery ?Use [email or web address] to contact on call provider ? ?08/29/2021 Patient ID: Kaitlyn Bray, female   DOB: Mar 13, 1966, 56 y.o.   MRN: [id number] ? ?

## 2021-08-29 NOTE — Progress Notes (Signed)
Mobility Specialist Progress Note  ? ? 08/29/21 1116  ?Mobility  ?Activity Ambulated independently in hallway  ?Level of Assistance Modified independent, requires aide device or extra time  ?Assistive Device Other (Comment) ?(IV pole)  ?Distance Ambulated (ft) 570 ft  ?Activity Response Tolerated well  ?$Mobility charge 1 Mobility  ? ?Pt received in bed and agreeable. Had void in BR. C/o pain with no rating given during. Returned to sitting EOB with call bell in reach.   ? ?[place] ?Mobility Specialist  ?  ?

## 2021-08-30 NOTE — Plan of Care (Signed)

## 2021-08-30 NOTE — Progress Notes (Signed)
Mobility Specialist Progress Note: ? ? 08/30/21 1342  ?Mobility  ?Activity Ambulated independently in hallway  ?Level of Assistance Independent  ?Assistive Device Front wheel walker  ?Distance Ambulated (ft) 300 ft  ?Activity Response Tolerated well  ?$Mobility charge 1 Mobility  ? ?Kaitlyn Bray ?Mobility Specialist ?Primary Phone [phone] ? ?

## 2021-08-30 NOTE — Progress Notes (Signed)
Patient ID: Kaitlyn Bray, female   DOB: 1966/05/14, 56 y.o.   MRN: [id number] ?4 Days Post-Op  ?  ?Subjective: ?Ate yesterday but vomited this AM, passing a lot of gas ?ROS negative except as listed above. ?Objective: ?Vital signs in last 24 hours: ?Temp:  [97.9 ?F (36.6 ?C)-98.9 ?F (37.2 ?C)] 98.9 ?F (37.2 ?C) (04/18 0459) ?Pulse Rate:  [72-93] 93 (04/18 0459) ?Resp:  [16-19] 19 (04/18 0459) ?BP: (118-131)/(63-88) 131/88 (04/18 0459) ?SpO2:  [94 %-97 %] 94 % (04/18 0459) ?Last BM Date : 08/25/21 ? ?Intake/Output from previous day: ?04/17 0701 - 04/18 0700 ?In: 2765 [P.O.:120; I.V.:2545; IV Piggyback:100] ?Out: 505 [Drains:505] ?Intake/Output this shift: ?No intake/output data recorded. ? ?General appearance: alert and cooperative ?Resp: clear to auscultation bilaterally ?GI: softer, incision CDI, JP SS ? ?Lab Results: ?CBC  ?Recent Labs  ?  08/28/21 ?0118  ?WBC 15.0*  ?HGB 12.8  ?HCT 40.6  ?PLT 241  ? ?BMET ?Recent Labs  ?  08/28/21 ?0118  ?NA 136  ?K 3.7  ?CL 100  ?CO2 29  ?GLUCOSE 127*  ?BUN 11  ?CREATININE 0.68  ?CALCIUM 8.3*  ? ?PT/INR ?No results for input(s): LABPROT, INR in the last 72 hours. ?ABG ?No results for input(s): PHART, HCO3 in the last 72 hours. ? ?Invalid input(s): PCO2, PO2 ? ?Studies/Results: ?No results found. ? ?Anti-infectives: ?Anti-infectives (From admission, onward)  ? ? Start     Dose/Rate Route Frequency Ordered Stop  ? 08/26/21 0645  ceFAZolin (ANCEF) IVPB 2g/100 mL premix       ? 2 g ?200 mL/hr over 30 Minutes Intravenous  Once 08/26/21 [id number] 08/26/21 0755  ? ?  ? ? ?Assessment/Plan: ?S/P repair recurrent incisional hernia with mesh 4/14 ?- ileus persists, back down to CL ?- trying tramadol for pain ?- mobilizing ?- continue binder ?VTE - lovenox ?Dispo - AROBF ? LOS: 2 days  ? ? ?Georganna Skeans, MD, MPH, FACS ?Trauma & General Surgery ?Use [email or web address] to contact on call provider ? ?08/30/2021  ?

## 2021-08-30 NOTE — Progress Notes (Signed)
Mobility Specialist Progress Note: ? ? 08/30/21 1230  ?Mobility  ?Activity Refused mobility  ? ?Pt EOB feeling nauseous. Will follow-up as time allows.  ? ?Kaitlyn Bray ?Mobility Specialist ?Primary Phone [phone] ? ?

## 2021-08-31 LAB — CBC
HCT: 40.1 % (ref 36.0–46.0)
Hemoglobin: 13.3 g/dL (ref 12.0–15.0)
MCH: 28.4 pg (ref 26.0–34.0)
MCHC: 33.2 g/dL (ref 30.0–36.0)
MCV: 85.7 fL (ref 80.0–100.0)
Platelets: 270 10*3/uL (ref 150–400)
RBC: 4.68 MIL/uL (ref 3.87–5.11)
RDW: 13.5 % (ref 11.5–15.5)
WBC: 15.4 10*3/uL — ABNORMAL HIGH (ref 4.0–10.5)
nRBC: 0 % (ref 0.0–0.2)

## 2021-08-31 LAB — BASIC METABOLIC PANEL
Anion gap: 12 (ref 5–15)
BUN: 6 mg/dL (ref 6–20)
CO2: 25 mmol/L (ref 22–32)
Calcium: 8.5 mg/dL — ABNORMAL LOW (ref 8.9–10.3)
Chloride: 95 mmol/L — ABNORMAL LOW (ref 98–111)
Creatinine, Ser: 0.51 mg/dL (ref 0.44–1.00)
GFR, Estimated: >60 mL/min (ref >60)
Glucose, Bld: 116 mg/dL — ABNORMAL HIGH (ref 70–99)
Potassium: 3.4 mmol/L — ABNORMAL LOW (ref 3.5–5.1)
Sodium: 132 mmol/L — ABNORMAL LOW (ref 135–145)

## 2021-08-31 MED ORDER — KCL IN DEXTROSE-NACL 20-5-0.9 MEQ/L-%-% IV SOLN
INTRAVENOUS | Status: AC
Start: 2021-08-31 — End: 2021-09-02
  Filled 2021-08-31 (×6): qty 1000

## 2021-08-31 MED ORDER — POTASSIUM CHLORIDE 2 MEQ/ML IV SOLN: INTRAVENOUS | Status: DC

## 2021-08-31 NOTE — Progress Notes (Signed)
Mobility Specialist Progress Note  ? ? 08/31/21 0959  ?Mobility  ?Activity Ambulated independently in hallway  ?Level of Assistance Modified independent, requires aide device or extra time  ?Assistive Device Other (Comment) ?(IV pole)  ?Distance Ambulated (ft) 570 ft  ?Activity Response Tolerated well  ?$Mobility charge 1 Mobility  ? ?Pt received sitting EOB and agreeable. C/o pain with no rating given. Returned to sitting EOB with call bell in reach.   ? ?Hildred Alamin ?Mobility Specialist  ?Primary: [address] Phone: [phone] ?Secondary: [address] Phone: [phone] ?  ?

## 2021-08-31 NOTE — Plan of Care (Signed)

## 2021-08-31 NOTE — Progress Notes (Signed)
Patient ID: DAJIAH KOOI, female   DOB: 05-10-1966, 56 y.o.   MRN: [id number] ?5 Days Post-Op  ?  ?Subjective: ?No vomiting, much less nausea, feels tired, some flatus ?ROS negative except as listed above. ?Objective: ?Vital signs in last 24 hours: ?Temp:  [98.2 ?F (36.8 ?C)-98.7 ?F (37.1 ?C)] 98.7 ?F (37.1 ?C) (04/19 0541) ?Pulse Rate:  [71-83] 83 (04/19 0541) ?Resp:  [15-16] 16 (04/19 0541) ?BP: (135-157)/(84-90) 151/90 (04/19 0541) ?SpO2:  [95 %-100 %] 97 % (04/19 0541) ?Last BM Date : 08/25/21 ? ?Intake/Output from previous day: ?04/18 0701 - 04/19 0700 ?In: 3065 [P.O.:240; I.V.:2525; IV Piggyback:300] ?Out: 455 [Drains:455] ?Intake/Output this shift: ?Total I/O ?In: 3065 [P.O.:240; I.V.:2525; IV Piggyback:300] ?Out: 165 [Drains:165] ? ?General appearance: alert and cooperative ?Resp: clear to auscultation bilaterally ?GI: soft, incision CDI, drain SS ?Extremities: calves soft ? ?Lab Results: ?CBC  ?Recent Labs  ?  08/31/21 ?0133  ?WBC 15.4*  ?HGB 13.3  ?HCT 40.1  ?PLT 270  ? ?BMET ?Recent Labs  ?  08/31/21 ?0133  ?NA 132*  ?K 3.4*  ?CL 95*  ?CO2 25  ?GLUCOSE 116*  ?BUN 6  ?CREATININE 0.51  ?CALCIUM 8.5*  ? ?PT/INR ?No results for input(s): LABPROT, INR in the last 72 hours. ?ABG ?No results for input(s): PHART, HCO3 in the last 72 hours. ? ?Invalid input(s): PCO2, PO2 ? ?Studies/Results: ?No results found. ? ?Anti-infectives: ?Anti-infectives (From admission, onward)  ? ? Start     Dose/Rate Route Frequency Ordered Stop  ? 08/26/21 0645  ceFAZolin (ANCEF) IVPB 2g/100 mL premix       ? 2 g ?200 mL/hr over 30 Minutes Intravenous  Once 08/26/21 0272 08/26/21 0755  ? ?  ? ? ?Assessment/Plan: ?S/P repair recurrent incisional hernia with mesh 4/14 ?- ileus, clears as able ?- change IVF for mild hyponatremia and mild hypokalemia ?- mobilizing ?- continue binder ?VTE - lovenox ?Dispo - AROBF ? LOS: 3 days  ? ? ?[name], MD, MPH, FACS ?Trauma & General Surgery ?Use [email or web address] to contact on call  provider ? ?08/31/2021  ?

## 2021-09-01 ENCOUNTER — Inpatient Hospital Stay (HOSPITAL_COMMUNITY): Payer: Commercial Managed Care - HMO

## 2021-09-01 LAB — BASIC METABOLIC PANEL
Anion gap: 8 (ref 5–15)
BUN: 5 mg/dL — ABNORMAL LOW (ref 6–20)
CO2: 29 mmol/L (ref 22–32)
Calcium: 8.4 mg/dL — ABNORMAL LOW (ref 8.9–10.3)
Chloride: 98 mmol/L (ref 98–111)
Creatinine, Ser: 0.55 mg/dL (ref 0.44–1.00)
GFR, Estimated: >60 mL/min (ref >60)
Glucose, Bld: 133 mg/dL — ABNORMAL HIGH (ref 70–99)
Potassium: 3.7 mmol/L (ref 3.5–5.1)
Sodium: 135 mmol/L (ref 135–145)

## 2021-09-01 NOTE — Progress Notes (Signed)
Patient ID: ALEZA PEW, female   DOB: 1965-10-05, 56 y.o.   MRN: [id number] ?6 Days Post-Op  ?  ?Subjective: ?Passing gas, a lot of cramping, no vomiting ?ROS negative except as listed above. ?Objective: ?Vital signs in last 24 hours: ?Temp:  [97.6 ?F (36.4 ?C)-98 ?F (36.7 ?C)] 98 ?F (36.7 ?C) (04/20 0450) ?Pulse Rate:  [73-78] 73 (04/20 0450) ?Resp:  [16-17] 16 (04/20 0450) ?BP: (142-160)/(75-86) 160/84 (04/20 0450) ?SpO2:  [94 %-95 %] 95 % (04/20 0450) ?Last BM Date : 08/25/21 ? ?Intake/Output from previous day: ?04/19 0701 - 04/20 0700 ?In: 2673.3 [P.O.:230; I.V.:2143.3; IV Piggyback:300] ?Out: 1000 [Urine:800; Drains:200] ?Intake/Output this shift: ?No intake/output data recorded. ? ?General appearance: cooperative ?Resp: clear to auscultation bilaterally ?GI: softer, incision CDI, JP serous ?Extremities: calves soft ? ?Lab Results: ?CBC  ?Recent Labs  ?  08/31/21 ?0133  ?WBC 15.4*  ?HGB 13.3  ?HCT 40.1  ?PLT 270  ? ?BMET ?Recent Labs  ?  08/31/21 ?0133 09/01/21 ?0932  ?NA 132* 135  ?K 3.4* 3.7  ?CL 95* 98  ?CO2 25 29  ?GLUCOSE 116* 133*  ?BUN 6 5*  ?CREATININE 0.51 0.55  ?CALCIUM 8.5* 8.4*  ? ?PT/INR ?No results for input(s): LABPROT, INR in the last 72 hours. ?ABG ?No results for input(s): PHART, HCO3 in the last 72 hours. ? ?Invalid input(s): PCO2, PO2 ? ?Studies/Results: ?No results found. ? ?Anti-infectives: ?Anti-infectives (From admission, onward)  ? ? Start     Dose/Rate Route Frequency Ordered Stop  ? 08/26/21 0645  ceFAZolin (ANCEF) IVPB 2g/100 mL premix       ? 2 g ?200 mL/hr over 30 Minutes Intravenous  Once 08/26/21 6712 08/26/21 0755  ? ?  ? ? ?Assessment/Plan: ?S/P repair recurrent incisional hernia with mesh 4/14 ?- ileus, check abdominal films, clears as able ?- Na and K better ?- mobilizing ?- continue binder ?VTE - lovenox ?Dispo - AROBF ? LOS: 4 days  ? ? ?[name], MD, MPH, FACS ?Trauma & General Surgery ?Use [email or web address] to contact on call provider ? ?09/01/2021  ?

## 2021-09-01 NOTE — Plan of Care (Signed)

## 2021-09-02 ENCOUNTER — Inpatient Hospital Stay: Payer: Self-pay

## 2021-09-02 LAB — CBC
HCT: 40.2 % (ref 36.0–46.0)
Hemoglobin: 13.1 g/dL (ref 12.0–15.0)
MCH: 28.2 pg (ref 26.0–34.0)
MCHC: 32.6 g/dL (ref 30.0–36.0)
MCV: 86.5 fL (ref 80.0–100.0)
Platelets: 292 10*3/uL (ref 150–400)
RBC: 4.65 MIL/uL (ref 3.87–5.11)
RDW: 13.6 % (ref 11.5–15.5)
WBC: 13.8 10*3/uL — ABNORMAL HIGH (ref 4.0–10.5)
nRBC: 0 % (ref 0.0–0.2)

## 2021-09-02 LAB — BASIC METABOLIC PANEL
Anion gap: 7 (ref 5–15)
BUN: 5 mg/dL — ABNORMAL LOW (ref 6–20)
CO2: 30 mmol/L (ref 22–32)
Calcium: 8.5 mg/dL — ABNORMAL LOW (ref 8.9–10.3)
Chloride: 96 mmol/L — ABNORMAL LOW (ref 98–111)
Creatinine, Ser: 0.63 mg/dL (ref 0.44–1.00)
GFR, Estimated: >60 mL/min (ref >60)
Glucose, Bld: 142 mg/dL — ABNORMAL HIGH (ref 70–99)
Potassium: 3.3 mmol/L — ABNORMAL LOW (ref 3.5–5.1)
Sodium: 133 mmol/L — ABNORMAL LOW (ref 135–145)

## 2021-09-02 LAB — GLUCOSE, CAPILLARY
Glucose-Capillary: 118 mg/dL — ABNORMAL HIGH (ref 70–99)
Glucose-Capillary: 140 mg/dL — ABNORMAL HIGH (ref 70–99)

## 2021-09-02 LAB — MAGNESIUM: Magnesium: 1.8 mg/dL (ref 1.7–2.4)

## 2021-09-02 LAB — PHOSPHORUS: Phosphorus: 3.6 mg/dL (ref 2.5–4.6)

## 2021-09-02 MED ORDER — POTASSIUM CHLORIDE 10 MEQ/100ML IV SOLN
10.0000 meq | INTRAVENOUS | Status: DC
  Administered 2021-09-02 (×2): 10 meq via INTRAVENOUS
  Filled 2021-09-02 (×2): qty 100

## 2021-09-02 MED ORDER — SODIUM CHLORIDE 0.9% FLUSH
10.0000 mL | Freq: Two times a day (BID) | INTRAVENOUS | Status: DC
  Administered 2021-09-02 – 2021-09-05 (×5): 10 mL

## 2021-09-02 MED ORDER — POTASSIUM CHLORIDE 10 MEQ/100ML IV SOLN
10.0000 meq | INTRAVENOUS | Status: AC
  Administered 2021-09-02 (×2): 10 meq via INTRAVENOUS
  Filled 2021-09-02 (×2): qty 100

## 2021-09-02 MED ORDER — TRAVASOL 10 % IV SOLN
INTRAVENOUS | Status: DC
  Filled 2021-09-02: qty 576

## 2021-09-02 MED ORDER — POTASSIUM CHLORIDE 10 MEQ/100ML IV SOLN: 10.0000 meq | INTRAVENOUS | Status: DC

## 2021-09-02 MED ORDER — KCL IN DEXTROSE-NACL 20-5-0.9 MEQ/L-%-% IV SOLN
INTRAVENOUS | Status: DC
  Filled 2021-09-02 (×3): qty 1000

## 2021-09-02 MED ORDER — SODIUM CHLORIDE 0.9% FLUSH: 10.0000 mL | INTRAVENOUS | Status: DC | PRN

## 2021-09-02 MED ORDER — INSULIN ASPART 100 UNIT/ML IJ SOLN
0.0000 [IU] | Freq: Four times a day (QID) | INTRAMUSCULAR | Status: DC
  Administered 2021-09-02 – 2021-09-03 (×3): 1 [IU] via SUBCUTANEOUS

## 2021-09-02 MED ORDER — CHLORHEXIDINE GLUCONATE CLOTH 2 % EX PADS
6.0000 | MEDICATED_PAD | Freq: Every day | CUTANEOUS | Status: DC
  Administered 2021-09-02 – 2021-09-05 (×4): 6 via TOPICAL

## 2021-09-02 NOTE — Progress Notes (Addendum)
Patient ID: Kaitlyn Bray, female   DOB: 1965-08-27, 56 y.o.   MRN: [id number] ?7 Days Post-Op  ?  ?Subjective: ?Passing gas, vomited some broth yesterday ?ROS negative except as listed above. ?Objective: ?Vital signs in last 24 hours: ?Temp:  [98 ?F (36.7 ?C)-98.7 ?F (37.1 ?C)] 98.1 ?F (36.7 ?C) (04/21 0320) ?Pulse Rate:  [73-82] 80 (04/21 0320) ?Resp:  [17-20] 17 (04/21 0320) ?BP: (132-159)/(73-84) 134/77 (04/21 0320) ?SpO2:  [95 %-96 %] 95 % (04/21 0320) ?Last BM Date : 08/25/21 ? ?Intake/Output from previous day: ?04/20 0701 - 04/21 0700 ?In: 1656.7 [P.O.:480; I.V.:1076.7; IV Piggyback:100] ?Out: 170 [Drains:170] ?Intake/Output this shift: ?No intake/output data recorded. ? ?General appearance: alert and cooperative ?Resp: clear to auscultation bilaterally ?GI: softer, incision CDI ?Extremities: no edema ? ?Lab Results: ?CBC  ?Recent Labs  ?  08/31/21 ?0133 09/02/21 ?0108  ?WBC 15.4* 13.8*  ?HGB 13.3 13.1  ?HCT 40.1 40.2  ?PLT 270 292  ? ?BMET ?Recent Labs  ?  09/01/21 ?8101 09/02/21 ?0108  ?NA 135 133*  ?K 3.7 3.3*  ?CL 98 96*  ?CO2 29 30  ?GLUCOSE 133* 142*  ?BUN 5* 5*  ?CREATININE 0.55 0.63  ?CALCIUM 8.4* 8.5*  ? ?PT/INR ?No results for input(s): LABPROT, INR in the last 72 hours. ?ABG ?No results for input(s): PHART, HCO3 in the last 72 hours. ? ?Invalid input(s): PCO2, PO2 ? ?Studies/Results: ?DG Abd 2 Views ? ?Result Date: 09/01/2021 ?CLINICAL DATA:  Recent hernia repair, constipation, nausea EXAM: ABDOMEN - 2 VIEW COMPARISON:  03/25/2021 FINDINGS: There is dilation of few small bowel loops in the left mid abdomen with air-fluid levels. Small bowel loops in the left mid abdomen measure up to 6.3 cm in diameter. There is no pneumoperitoneum. Gas and stool are present in colon. Stomach is not distended. No abnormal masses or calcifications are seen. A catheter is noted in the pelvis with its tip projecting over the left side of pelvis. IMPRESSION: There is dilation of proximal small bowel loops. This may  suggest ileus or partial small bowel obstruction. Electronically Signed   By: Ernie Avena M.D.   On: 09/01/2021 10:05  ? ?[place] EKG SITE RITE ? ?Result Date: 09/02/2021 ?If [employer] not attached, placement could not be confirmed due to current cardiac rhythm.  ? ?Anti-infectives: ?Anti-infectives (From admission, onward)  ? ? Start     Dose/Rate Route Frequency Ordered Stop  ? 08/26/21 0645  ceFAZolin (ANCEF) IVPB 2g/100 mL premix       ? 2 g ?200 mL/hr over 30 Minutes Intravenous  Once 08/26/21 7510 08/26/21 0755  ? ?  ? ? ?Assessment/Plan: ?S/P repair recurrent incisional hernia with mesh 4/14 ?- prolonged ileus/protein calorie malnutrition, abd film yesterday noted. If vomits more will need NGT but she would like to avoid. Need to start TNA as day 7 so will order PICC. ?- replete hypokalemia ?- mobilizing ?- continue binder ?- continue JP ?Obesity - BMI 43 ?VTE - lovenox ?Dispo - AROBF ? LOS: 5 days  ? ? ?Kaitlyn Gelinas, MD, MPH, FACS ?Trauma & General Surgery ?Use [email or web address] to contact on call provider ? ?09/02/2021  ?

## 2021-09-02 NOTE — Progress Notes (Signed)
Peripherally Inserted Central Catheter Placement ? ?The IV Nurse has discussed with the patient and/or persons authorized to consent for the patient, the purpose of this procedure and the potential benefits and risks involved with this procedure.  The benefits include less needle sticks, lab draws from the catheter, and the patient may be discharged home with the catheter. Risks include, but not limited to, infection, bleeding, blood clot (thrombus formation), and puncture of an artery; nerve damage and irregular heartbeat and possibility to perform a PICC exchange if needed/ordered by physician.  Alternatives to this procedure were also discussed.  Bard Power PICC patient education guide, fact sheet on infection prevention and patient information card has been provided to patient /or left at bedside.   ? ?PICC Placement Documentation  ?PICC Double Lumen 09/02/21 Right Brachial 40 cm 1 cm (Active)  ?Indication for Insertion or Continuance of Line Administration of hyperosmolar/irritating solutions (i.e. TPN, Vancomycin, etc.) 09/02/21 1100  ?Exposed Catheter (cm) 1 cm 09/02/21 1100  ?Site Assessment Clean, Dry, Intact 09/02/21 1100  ?Lumen #1 Status Flushed;Saline locked;Blood return noted 09/02/21 1100  ?Lumen #2 Status Flushed;Saline locked;Blood return noted 09/02/21 1100  ?Dressing Type Securing device;Transparent 09/02/21 1100  ?Dressing Status Antimicrobial disc in place 09/02/21 1100  ?Safety Lock Not Applicable 09/02/21 1100  ?Line Care Connections checked and tightened 09/02/21 1100  ?Dressing Intervention New dressing 09/02/21 1100  ?Dressing Change Due 09/09/21 09/02/21 1100  ? ? ? ? ? ?Franne Grip Renee ?09/02/2021, 11:11 AM ? ?

## 2021-09-02 NOTE — Progress Notes (Signed)
Mobility Specialist Progress Note: ? ? 09/02/21 1452  ?Mobility  ?Activity Ambulated independently in hallway  ?Level of Assistance Independent  ?Assistive Device Front wheel walker  ?Distance Ambulated (ft) 570 ft  ?Activity Response Tolerated well  ?$Mobility charge 1 Mobility  ? ?Pt walking with family.  ? ?Kaitlyn Bray ?Mobility Specialist ?Primary Phone [phone] ? ?

## 2021-09-02 NOTE — Progress Notes (Signed)
PHARMACY - TOTAL PARENTERAL NUTRITION CONSULT NOTE  ? ?Indication: Prolonged ileus ? ?Patient Measurements: ?Height: 5\' 5"  (165.1 cm) ?Weight: 117.9 kg (260 lb) ?IBW/kg (Calculated) : 57 ?TPN AdjBW (KG): 72.2 ?Body mass index is 43.27 kg/m?. ?Usual Weight: ~116 kg ? ?Assessment: 56 y.o. F s/p repair recurrent incisonal hernia with mesh on 4/14. Pt continues with nausea/vomiting and abd Xray shows dilation of proximal small bowel loops suggestive of ileus or partial SBO. Since this is day #7 on CLQ diet with very small amount of intake, pharmacy consulted to start TPN for prolonged ileus. ? ?Glucose / Insulin: No h/o DM. BG <150. ?Electrolytes: K 3.3, Na 133, other lytes wnl. ?Renal: SCr 0.63 (at baseline), BUN 5 ?Hepatic: No LFTs this admission ?Intake / Output; MIVF: D5NS with 20 KCl/L at 100 ml/hr. Last BM 08/25/21 ?GI Imaging: ?4/20 Abd xray: dilation of proximal small bowel loops suggestive of ileus or partial SBO ?GI Surgeries / Procedures:  ?4/14 repair recurrent incisional hernia with mesh ? ?Central access: PICC to be placed 4/21 ?TPN start date: 09/02/21 ? ?Nutritional Goals (Estimated): ?2000-2200 kcal ?100-110 gm protein ? ?RD Assessment: ? Pending ? ?Current Nutrition:  ?Clear liquids and TPN ? ?Plan:  ?Start TPN at 50 mL/hr at 1800. This will provide 1142 kcal and 57 gm protein. Will advance to goal rate as tolerated. ?KCl 09/04/21 IV x 4 runs ?Electrolytes in TPN: Na 149mEq/L, K 55mEq/L, Ca 48mEq/L, Mg 105mEq/L, and Phos 31mmol/L. Cl:Ac 1:1 ?Add standard MVI and trace elements to TPN ?Initiate Sensitive q6h SSI and adjust as needed  ?Reduce MIVF (D5NS with 20KCl/L) to 50 mL/hr at 1800 ?Monitor TPN labs daily until stable then on Mon/Thurs ? ?12m, PharmD, BCPS ?Please see amion for complete clinical pharmacist phone list ?09/02/2021,7:42 AM ? ?

## 2021-09-02 NOTE — Progress Notes (Signed)
Nutrition Follow-up ? ?DOCUMENTATION CODES:  ? ?Morbid obesity ? ?INTERVENTION:  ?TPN per Pharmacy.  ? ?Monitor magnesium, potassium, and phosphorus BID for at least 3 days, MD to replete as needed, as pt is at risk for refeeding syndrome given intolerance of PO x 7 days. ? ?NUTRITION DIAGNOSIS:  ? ?Inadequate oral intake related to vomiting (ileus, pSBO) as evidenced by meal completion < 25%. ? ?GOAL:  ? ?Patient will meet greater than or equal to 90% of their needs ? ?MONITOR:  ? ?PO intake, Diet advancement, Labs, Weight trends, Skin, I & O's, Other (Comment) (TPN) ? ?REASON FOR ASSESSMENT:  ? ?Consult ?New TPN/TNA ? ?ASSESSMENT:  ? ?56 year old female with no significant PMH presents with repair of recurrent incisional hernia with mesh.  ? ?4/14 - s/p incisional hernia repair, insertion of mesh ? ?PICC placed today. Per abd films yesterday, pt with dilation of proximal small bowel loops that suggests ileus or partial small bowel obstruction. Pt is currently on a clear liquid diet as able/tolerated per MD. Pt reports she has not been able to tolerate PO since admission (7 days). Per MD, if pt continues with emesis, will plan to place NGT for suction. TPN to be initiated today. Per Pharmacy plans to start at 50 ml/hr to provide 1141 kcal and 57 grams of protein and advance rate as tolerated. Noted, pt at risk for refeeding syndrome given intolerance to PO x 7 days. Pt reports prior to admission, she was eating well with usual consumption of at least 3 meals a day with no difficulties. Pt with no significant weight loss per weight records.  ? ?NUTRITION - FOCUSED PHYSICAL EXAM: ? ?Flowsheet Row Most Recent Value  ?Orbital Region No depletion  ?Upper Arm Region No depletion  ?Thoracic and Lumbar Region No depletion  ?Buccal Region No depletion  ?Temple Region No depletion  ?Clavicle Bone Region No depletion  ?Clavicle and Acromion Bone Region No depletion  ?Scapular Bone Region Unable to assess  ?Dorsal Hand No  depletion  ?Patellar Region No depletion  ?Anterior Thigh Region No depletion  ?Posterior Calf Region No depletion  ? ?  ? ?Labs and medications reviewed.  ? ?Diet Order:   ?Diet Order   ? ?       ?  Diet clear liquid Room service appropriate? Yes; Fluid consistency: Thin  Diet effective now       ?  ? ?  ?  ? ?  ? ? ?EDUCATION NEEDS:  ? ?Not appropriate for education at this time ? ?Skin:  Skin Assessment: Skin Integrity Issues: ?Skin Integrity Issues:: Incisions ?Incisions: abdomen ? ?Last BM:  4/13 ? ?Height:  ? ?Ht Readings from Last 1 Encounters:  ?08/26/21 5\' 5"  (1.651 m)  ? ? ?Weight:  ? ?Wt Readings from Last 1 Encounters:  ?08/26/21 117.9 kg  ? ?BMI:  Body mass index is 43.27 kg/m?. ? ?Estimated Nutritional Needs:  ? ?Kcal:  1900-2100 ? ?Protein:  110-120 grams ? ?Fluid:  >/= 1.9 L/day ? ?08/28/21, MS, RD, LDN ?RD pager number/after hours weekend pager number on Amion. ? ?

## 2021-09-02 NOTE — Plan of Care (Signed)

## 2021-09-03 LAB — CBC
HCT: 40.7 % (ref 36.0–46.0)
Hemoglobin: 12.8 g/dL (ref 12.0–15.0)
MCH: 27.8 pg (ref 26.0–34.0)
MCHC: 31.4 g/dL (ref 30.0–36.0)
MCV: 88.3 fL (ref 80.0–100.0)
Platelets: 335 10*3/uL (ref 150–400)
RBC: 4.61 MIL/uL (ref 3.87–5.11)
RDW: 13.8 % (ref 11.5–15.5)
WBC: 12.9 10*3/uL — ABNORMAL HIGH (ref 4.0–10.5)
nRBC: 0 % (ref 0.0–0.2)

## 2021-09-03 LAB — COMPREHENSIVE METABOLIC PANEL
ALT: 21 U/L (ref 0–44)
AST: 15 U/L (ref 15–41)
Albumin: 2.9 g/dL — ABNORMAL LOW (ref 3.5–5.0)
Alkaline Phosphatase: 60 U/L (ref 38–126)
Anion gap: 9 (ref 5–15)
BUN: 6 mg/dL (ref 6–20)
CO2: 27 mmol/L (ref 22–32)
Calcium: 8.6 mg/dL — ABNORMAL LOW (ref 8.9–10.3)
Chloride: 99 mmol/L (ref 98–111)
Creatinine, Ser: 0.58 mg/dL (ref 0.44–1.00)
GFR, Estimated: >60 mL/min (ref >60)
Glucose, Bld: 127 mg/dL — ABNORMAL HIGH (ref 70–99)
Potassium: 3.8 mmol/L (ref 3.5–5.1)
Sodium: 135 mmol/L (ref 135–145)
Total Bilirubin: 0.5 mg/dL (ref 0.3–1.2)
Total Protein: 5.8 g/dL — ABNORMAL LOW (ref 6.5–8.1)

## 2021-09-03 LAB — GLUCOSE, CAPILLARY
Glucose-Capillary: 116 mg/dL — ABNORMAL HIGH (ref 70–99)
Glucose-Capillary: 136 mg/dL — ABNORMAL HIGH (ref 70–99)
Glucose-Capillary: 142 mg/dL — ABNORMAL HIGH (ref 70–99)
Glucose-Capillary: 142 mg/dL — ABNORMAL HIGH (ref 70–99)

## 2021-09-03 LAB — MAGNESIUM: Magnesium: 2.1 mg/dL (ref 1.7–2.4)

## 2021-09-03 LAB — TRIGLYCERIDES: Triglycerides: 153 mg/dL — ABNORMAL HIGH (ref <150)

## 2021-09-03 LAB — PHOSPHORUS: Phosphorus: 3.1 mg/dL (ref 2.5–4.6)

## 2021-09-03 MED ORDER — TRAVASOL 10 % IV SOLN: INTRAVENOUS | Status: AC

## 2021-09-03 MED ORDER — TRAVASOL 10 % IV SOLN: INTRAVENOUS | Status: DC

## 2021-09-03 NOTE — Progress Notes (Signed)
8 Days Post-Op  ? ?Subjective/Chief Complaint: ?Pt with mult BMs overnight ?Tol clear ?TNA  ? ? ?Objective: ?Vital signs in last 24 hours: ?Temp:  [97.9 ?F (36.6 ?C)-98.7 ?F (37.1 ?C)] 98.4 ?F (36.9 ?C) (04/22 0456) ?Pulse Rate:  [79-94] 79 (04/22 0456) ?Resp:  [17-18] 18 (04/22 0456) ?BP: (138-154)/(79-84) 138/84 (04/22 0456) ?SpO2:  [95 %-96 %] 95 % (04/22 0456) ?Last BM Date : 08/25/21 ? ?Intake/Output from previous day: ?04/21 0701 - 04/22 0700 ?In: 3902.5 [P.O.:120; I.V.:3082.5; IV Piggyback:700] ?Out: 120 [Drains:120] ?Intake/Output this shift: ?No intake/output data recorded. ? ?General appearance: alert and cooperative ?GI: abd soft, nttp, inc c/d/i ? ?Lab Results:  ?Recent Labs  ?  09/02/21 ?0108 09/03/21 ?0451  ?WBC 13.8* 12.9*  ?HGB 13.1 12.8  ?HCT 40.2 40.7  ?PLT 292 335  ? ?BMET ?Recent Labs  ?  09/02/21 ?0108 09/03/21 ?0451  ?NA 133* 135  ?K 3.3* 3.8  ?CL 96* 99  ?CO2 30 27  ?GLUCOSE 142* 127*  ?BUN 5* 6  ?CREATININE 0.63 0.58  ?CALCIUM 8.5* 8.6*  ? ?PT/INR ?No results for input(s): LABPROT, INR in the last 72 hours. ?ABG ?No results for input(s): PHART, HCO3 in the last 72 hours. ? ?Invalid input(s): PCO2, PO2 ? ?Studies/Results: ?DG Abd 2 Views ? ?Result Date: 09/01/2021 ?CLINICAL DATA:  Recent hernia repair, constipation, nausea EXAM: ABDOMEN - 2 VIEW COMPARISON:  03/25/2021 FINDINGS: There is dilation of few small bowel loops in the left mid abdomen with air-fluid levels. Small bowel loops in the left mid abdomen measure up to 6.3 cm in diameter. There is no pneumoperitoneum. Gas and stool are present in colon. Stomach is not distended. No abnormal masses or calcifications are seen. A catheter is noted in the pelvis with its tip projecting over the left side of pelvis. IMPRESSION: There is dilation of proximal small bowel loops. This may suggest ileus or partial small bowel obstruction. Electronically Signed   By: Ernie Avena M.D.   On: 09/01/2021 10:05  ? ?[place] EKG SITE RITE ? ?Result Date:  09/02/2021 ?If [employer] not attached, placement could not be confirmed due to current cardiac rhythm.  ? ?Anti-infectives: ?Anti-infectives (From admission, onward)  ? ? Start     Dose/Rate Route Frequency Ordered Stop  ? 08/26/21 0645  ceFAZolin (ANCEF) IVPB 2g/100 mL premix       ? 2 g ?200 mL/hr over 30 Minutes Intravenous  Once 08/26/21 2542 08/26/21 0755  ? ?  ? ? ?Assessment/Plan: ?s/p Procedure(s): ?INCISIONAL HERNIA REPAIR (N/A) ?INSERTION OF MESH (N/A) ?Advance diet to fulls for now ?1/2 TNA ?Mobilize ?Home in next 1-2 days if con't to improve ? LOS: 6 days  ? ? ?Axel Filler ?09/03/2021 ? ?

## 2021-09-03 NOTE — Progress Notes (Signed)
PHARMACY - TOTAL PARENTERAL NUTRITION CONSULT NOTE  ? ?Indication: Prolonged ileus ? ?Patient Measurements: ?Height: 5\' 5"  (165.1 cm) ?Weight: 117.9 kg (260 lb) ?IBW/kg (Calculated) : 57 ?TPN AdjBW (KG): 72.2 ?Body mass index is 43.27 kg/m?. ?Usual Weight: ~116 kg ? ?Assessment: 56 y.o. F s/p repair recurrent incisonal hernia with mesh on 4/14. Pt continues with nausea/vomiting and abd Xray shows dilation of proximal small bowel loops suggestive of ileus or partial SBO. Since this is day #7 on CLQ diet with very small amount of intake, pharmacy consulted to start TPN for prolonged ileus. ? ?Current Nutrition:  ?Clear liquids and TPN ? ?Plan:  ?Pt now tolerating clear liquids with multiple bowel movements overnight. TPN to be be d/c after current bag finishes. ?Will d/c TPN order set ?D/C CBG checks and SSI ?D/c TPN labs ? ?5/14, PharmD, BCPS ?Please see amion for complete clinical pharmacist phone list ?09/03/2021,8:14 AM ? ?

## 2021-09-04 LAB — GLUCOSE, CAPILLARY
Glucose-Capillary: 106 mg/dL — ABNORMAL HIGH (ref 70–99)
Glucose-Capillary: 107 mg/dL — ABNORMAL HIGH (ref 70–99)
Glucose-Capillary: 118 mg/dL — ABNORMAL HIGH (ref 70–99)
Glucose-Capillary: 134 mg/dL — ABNORMAL HIGH (ref 70–99)

## 2021-09-04 NOTE — Progress Notes (Signed)
Mobility Specialist Progress Note: ? ? 09/04/21 1100  ?Mobility  ?Activity Ambulated independently in hallway  ?Level of Assistance Independent  ?Assistive Device  ?(IV pole)  ?Distance Ambulated (ft) 570 ft  ?Activity Response Tolerated well  ?$Mobility charge 1 Mobility  ? ?Kaitlyn Bray ?Mobility Specialist ?Primary Phone [phone] ? ?

## 2021-09-04 NOTE — Progress Notes (Signed)
9 Days Post-Op  ? ?Subjective/Chief Complaint: ?Patient is doing well today.  She is tolerating her clear liquid diet.  She is asking for more food.  She has had a bowel movement. ? ? ? ? ?Objective: ?Vital signs in last 24 hours: ?Temp:  [98 ?F (36.7 ?C)-98.7 ?F (37.1 ?C)] 98.1 ?F (36.7 ?C) (04/23 [id number]) ?Pulse Rate:  [83-99] 84 (04/23 0738) ?Resp:  [18-19] 18 (04/23 [id number]) ?BP: (131-142)/(64-82) 142/82 (04/23 [id number]) ?SpO2:  [96 %-97 %] 96 % (04/23 0738) ?Last BM Date : 09/03/21 ? ?Intake/Output from previous day: ?04/22 0701 - 04/23 0700 ?In: 1199.5 [P.O.:240; I.V.:846.5; IV Piggyback:113] ?Out: 120 [Drains:120] ?Intake/Output this shift: ?No intake/output data recorded. ? ?General appearance: alert and cooperative ?GI: soft, non-tender; bowel sounds normal; no masses,  no organomegaly and incision clean dry and intact. ? ?Lab Results:  ?Recent Labs  ?  09/02/21 ?0108 09/03/21 ?0451  ?WBC 13.8* 12.9*  ?HGB 13.1 12.8  ?HCT 40.2 40.7  ?PLT 292 335  ? ?BMET ?Recent Labs  ?  09/02/21 ?0108 09/03/21 ?0451  ?NA 133* 135  ?K 3.3* 3.8  ?CL 96* 99  ?CO2 30 27  ?GLUCOSE 142* 127*  ?BUN 5* 6  ?CREATININE 0.63 0.58  ?CALCIUM 8.5* 8.6*  ? ?PT/INR ?No results for input(s): LABPROT, INR in the last 72 hours. ?ABG ?No results for input(s): PHART, HCO3 in the last 72 hours. ? ?Invalid input(s): PCO2, PO2 ? ?Studies/Results: ?No results found. ? ?Anti-infectives: ?Anti-infectives (From admission, onward)  ? ? Start     Dose/Rate Route Frequency Ordered Stop  ? 08/26/21 0645  ceFAZolin (ANCEF) IVPB 2g/100 mL premix       ? 2 g ?200 mL/hr over 30 Minutes Intravenous  Once 08/26/21 [id number] 08/26/21 0755  ? ?  ? ? ?Assessment/Plan: ?s/p Procedure(s): ?INCISIONAL HERNIA REPAIR (N/A) ?INSERTION OF MESH (N/A) ?Plan for discharge tomorrow if tolerating normal food. ?Continue to ambulate ?We will Hep-Lock her IV fluids. ? LOS: 7 days  ? ? ?Ralene Ok ?09/04/2021 ? ?

## 2021-09-05 ENCOUNTER — Other Ambulatory Visit (HOSPITAL_COMMUNITY): Payer: Self-pay

## 2021-09-05 LAB — GLUCOSE, CAPILLARY: Glucose-Capillary: 118 mg/dL — ABNORMAL HIGH (ref 70–99)

## 2021-09-05 MED ORDER — TRAMADOL HCL 50 MG PO TABS
100.0000 mg | ORAL_TABLET | Freq: Four times a day (QID) | ORAL | 0 refills | Status: DC | PRN
  Filled 2021-09-05: qty 25, 4d supply, fill #0

## 2021-09-05 MED ORDER — METHOCARBAMOL 500 MG PO TABS
500.0000 mg | ORAL_TABLET | Freq: Three times a day (TID) | ORAL | 0 refills | Status: DC | PRN
  Filled 2021-09-05: qty 20, 7d supply, fill #0

## 2021-09-05 NOTE — Progress Notes (Signed)
Kaitlyn Bray to be D/C'd  per MD order.  Discussed with the patient and all questions fully answered. ? ?VSS, Skin clean, dry and intact without evidence of skin break down, no evidence of skin tears noted. ? ?IV catheter discontinued intact. Site without signs and symptoms of complications. Dressing and pressure applied. ? ?An After Visit Summary was printed and given to the patient. ? ?D/c education completed with patient/family including follow up instructions, medication list, d/c activities limitations if indicated, with other d/c instructions as indicated by MD - patient able to verbalize understanding, all questions fully answered.  ? ?Patient instructed to return to ED, call 911, or call MD for any changes in condition.  ? ?Patient to be escorted via [place], and D/C home via private auto.  ?

## 2021-09-05 NOTE — Progress Notes (Signed)
IVT present to remove PICC.  Pt has IV Rx to run.  Primary RN to chat me when ready for line to be dc'd.  ?

## 2021-09-05 NOTE — Plan of Care (Signed)
  Problem: Clinical Measurements: Goal: Will remain free from infection Outcome: Progressing   Problem: Nutrition: Goal: Adequate nutrition will be maintained Outcome: Progressing   

## 2021-09-05 NOTE — Discharge Summary (Signed)
Physician Discharge Summary  ?Patient ID: ?Kaitlyn Bray ?MRN: [id number] ?DOB/AGE: 56/05/1965 56 y.o. ? ?Admit date: 08/26/2021 ?Discharge date: 09/05/2021 ? ?Admission Diagnoses: Recurrent incisional hernia ? ?Discharge Diagnoses: Status post repair of recurrent incisional hernia with mesh ?Principal Problem: ?  S/P hernia repair ? ? ?Discharged Condition: good ? ?Hospital Course: Patient underwent repair of recurrent incisional hernia with mesh on 08/26/2021.  Postoperatively she initially improved but then developed an ileus.  This was persistent requiring placement of a PICC line and TNA briefly.  Shortly after that, she began regaining bowel function.  Pain control was good and she mobilized well.  She is tolerating her diet and is ready to go home.  We will see her back in the office next week to remove her JP drain. ? ?Consults: None ? ?Significant Diagnostic Studies: None ? ?Treatments: TPN ? ?Discharge Exam: ?Blood pressure (!) 155/89, pulse 86, temperature 98.2 ?F (36.8 ?C), temperature source Oral, resp. rate 18, height 5\' 5"  (1.651 m), weight 117.9 kg, SpO2 95 %. ?General appearance: alert and cooperative ?Resp: clear to auscultation bilaterally ?Cardio: regular rate and rhythm ?GI: Soft, incision clean dry and intact, JP serous ?Extremities: Calves are soft ? ?Disposition: Discharge disposition: 01-Home or Self Care ? ? ? ? ? ? ?Discharge Instructions   ? ? Call MD for:  redness, tenderness, or signs of infection (pain, swelling, redness, odor or green/yellow discharge around incision site)   Complete by: As directed ?  ? Diet - low sodium heart healthy   Complete by: As directed ?  ? Discharge instructions   Complete by: As directed ?  ? CCS      [facility] [place] ?[phone] ? ?OPEN ABDOMINAL SURGERY: POST OP INSTRUCTIONS ? ?Always review your discharge instruction sheet given to you by the facility where your surgery was performed. ? ?IF YOU HAVE DISABILITY OR FAMILY LEAVE FORMS, YOU MUST  BRING THEM TO THE OFFICE FOR PROCESSING.  PLEASE DO NOT GIVE THEM TO YOUR DOCTOR. ? ?A prescription for pain medication may be given to you upon discharge.  Take your pain medication as prescribed, if needed.  If narcotic pain medicine is not needed, then you may take acetaminophen (Tylenol) or ibuprofen (Advil) as needed. ?Take your usually prescribed medications unless otherwise directed. ?If you need a refill on your pain medication, please contact your pharmacy. They will contact our office to request authorization.  Prescriptions will not be filled after 5pm or on week-ends. ?You should follow a light diet the first few days after arrival home, such as soup and crackers, pudding, etc.unless your doctor has advised otherwise. A high-fiber, low fat diet can be resumed as tolerated.   Be sure to include lots of fluids daily. Most patients will experience some swelling and bruising on the chest and neck area.  Ice packs will help.  Swelling and bruising can take several days to resolve ?Most patients will experience some swelling and bruising in the area of the incision. Ice pack will help. Swelling and bruising can take several days to resolve.Marland Kitchen.  ?It is common to experience some constipation if taking pain medication after surgery.  Increasing fluid intake and taking a stool softener will usually help or prevent this problem from occurring.  A mild laxative (Milk of Magnesia or Miralax) should be taken according to package directions if there are no bowel movements after 48 hours. ? You may have steri-strips (small skin tapes) in place directly over the incision.  These strips  should be left on the skin for 7-10 days.  If your surgeon used skin glue on the incision, you may shower in 24 hours.  The glue will flake off over the next 2-3 weeks.  Any sutures or staples will be removed at the office during your follow-up visit. You may find that a light gauze bandage over your incision may keep your staples from being  rubbed or pulled. You may shower and replace the bandage daily. ?ACTIVITIES:  You may resume regular (light) daily activities beginning the next day-such as daily self-care, walking, climbing stairs-gradually increasing activities as tolerated.  You may have sexual intercourse when it is comfortable.  Refrain from any heavy lifting or straining until approved by your doctor. ?You may drive when you no longer are taking prescription pain medication, you can comfortably wear a seatbelt, and you can safely maneuver your car and apply brakes ?Return to Work: ___________________________________ ?You should see your doctor in the office for a follow-up appointment approximately two weeks after your surgery.  Make sure that you call for this appointment within a day or two after you arrive home to insure a convenient appointment time. ?OTHER INSTRUCTIONS:  ?_____________________________________________________________ ?_____________________________________________________________ ? ?WHEN TO CALL YOUR DOCTOR: ?Fever over 101.0 ?Inability to urinate ?Nausea and/or vomiting ?Extreme swelling or bruising ?Continued bleeding from incision. ?Increased pain, redness, or drainage from the incision. ?Difficulty swallowing or breathing ?Muscle cramping or spasms. ?Numbness or tingling in hands or feet or around lips. ? ?The clinic staff is available to answer your questions during regular business hours.  Please don't hesitate to call and ask to speak to one of the nurses if you have concerns. ? ?For further questions, please visit [email or web address]  ? Increase activity slowly   Complete by: As directed ?  ? Lifting restrictions   Complete by: As directed ?  ? No lifting for 5 more weeks  ? ?  ? ?Allergies as of 09/05/2021   ? ?   Reactions  ? Codeine Other (See Comments)  ? Fast heart rate  ? Latex Other (See Comments)  ? Redness from bandaids  ? Penicillins Rash  ? Sulfa Drugs Cross Reactors Rash  ? ?  ? ?  ?Medication  List  ?  ? ?TAKE these medications   ? ?albuterol 108 (90 Base) MCG/ACT inhaler ?Commonly known as: VENTOLIN HFA ?Inhale 1-2 puffs into the lungs every 6 (six) hours as needed for wheezing or shortness of breath. ?  ?benzonatate 200 MG capsule ?Commonly known as: TESSALON ?Take 200 mg by mouth 3 (three) times daily as needed for cough. ?  ?ibuprofen 200 MG tablet ?Commonly known as: ADVIL ?Take 200 mg by mouth every 4 (four) hours as needed for headache. ?  ?losartan-hydrochlorothiazide 50-12.5 MG tablet ?Commonly known as: HYZAAR ?TAKE 1 TABLET BY MOUTH EVERY DAY ?What changed: when to take this ?  ?methocarbamol 500 MG tablet ?Commonly known as: Robaxin ?Take 1 tablet (500 mg total) by mouth every 8 (eight) hours as needed for muscle spasms. ?  ?traMADol 50 MG tablet ?Commonly known as: ULTRAM ?Take 2 tablets (100 mg total) by mouth every 6 (six) hours as needed for severe pain. ?  ? ?  ? ? Follow-up Information   ? ? [name], MD Follow up.   ?Specialty: General Surgery ?Why: as scheduled. My office will also call for a drain removal appointment next week ?Contact information: ?[address] ?[address] ?[place] [address] ?[phone] ? ? ?  ?  ? ?  ?  ? ?  ? ? ?  Signed: ?Liz Malady ?09/05/2021, 7:52 AM ? ? ?

## 2021-09-15 ENCOUNTER — Emergency Department (HOSPITAL_COMMUNITY): Payer: Commercial Managed Care - HMO

## 2021-09-15 ENCOUNTER — Encounter (HOSPITAL_COMMUNITY): Payer: Self-pay | Admitting: Emergency Medicine

## 2021-09-15 ENCOUNTER — Inpatient Hospital Stay (HOSPITAL_COMMUNITY)
Admission: EM | Admit: 2021-09-15 | Discharge: 2021-09-23 | DRG: 389 | Disposition: A | Discharge status: Home / Self Care | Payer: Commercial Managed Care - HMO | Attending: General Surgery | Admitting: General Surgery

## 2021-09-15 ENCOUNTER — Other Ambulatory Visit: Payer: Self-pay

## 2021-09-15 DIAGNOSIS — K432 Incisional hernia without obstruction or gangrene: Secondary | ICD-10-CM | POA: Diagnosis present

## 2021-09-15 DIAGNOSIS — I10 Essential (primary) hypertension: Secondary | ICD-10-CM | POA: Diagnosis present

## 2021-09-15 DIAGNOSIS — Z885 Allergy status to narcotic agent status: Secondary | ICD-10-CM | POA: Diagnosis not present

## 2021-09-15 DIAGNOSIS — K56609 Unspecified intestinal obstruction, unspecified as to partial versus complete obstruction: Secondary | ICD-10-CM

## 2021-09-15 DIAGNOSIS — Z880 Allergy status to penicillin: Secondary | ICD-10-CM | POA: Diagnosis not present

## 2021-09-15 DIAGNOSIS — Z6841 Body Mass Index (BMI) 40.0 and over, adult: Secondary | ICD-10-CM | POA: Diagnosis not present

## 2021-09-15 DIAGNOSIS — Z8249 Family history of ischemic heart disease and other diseases of the circulatory system: Secondary | ICD-10-CM | POA: Diagnosis not present

## 2021-09-15 DIAGNOSIS — Z91040 Latex allergy status: Secondary | ICD-10-CM

## 2021-09-15 DIAGNOSIS — Z882 Allergy status to sulfonamides status: Secondary | ICD-10-CM | POA: Diagnosis not present

## 2021-09-15 DIAGNOSIS — Z79891 Long term (current) use of opiate analgesic: Secondary | ICD-10-CM | POA: Diagnosis not present

## 2021-09-15 DIAGNOSIS — K913 Postprocedural intestinal obstruction, unspecified as to partial versus complete: Principal | ICD-10-CM | POA: Diagnosis present

## 2021-09-15 DIAGNOSIS — Z90710 Acquired absence of both cervix and uterus: Secondary | ICD-10-CM | POA: Diagnosis not present

## 2021-09-15 HISTORY — DX: Unspecified intestinal obstruction, unspecified as to partial versus complete obstruction: K56.609

## 2021-09-15 LAB — LIPASE, BLOOD: Lipase: 21 U/L (ref 11–51)

## 2021-09-15 LAB — COMPREHENSIVE METABOLIC PANEL
ALT: 37 U/L (ref 0–44)
AST: 24 U/L (ref 15–41)
Albumin: 3 g/dL — ABNORMAL LOW (ref 3.5–5.0)
Alkaline Phosphatase: 68 U/L (ref 38–126)
Anion gap: 9 (ref 5–15)
BUN: 10 mg/dL (ref 6–20)
CO2: 29 mmol/L (ref 22–32)
Calcium: 8.8 mg/dL — ABNORMAL LOW (ref 8.9–10.3)
Chloride: 99 mmol/L (ref 98–111)
Creatinine, Ser: 0.61 mg/dL (ref 0.44–1.00)
GFR, Estimated: >60 mL/min (ref >60)
Glucose, Bld: 112 mg/dL — ABNORMAL HIGH (ref 70–99)
Potassium: 3.6 mmol/L (ref 3.5–5.1)
Sodium: 137 mmol/L (ref 135–145)
Total Bilirubin: 0.3 mg/dL (ref 0.3–1.2)
Total Protein: 5.9 g/dL — ABNORMAL LOW (ref 6.5–8.1)

## 2021-09-15 LAB — CBC
HCT: 41.4 % (ref 36.0–46.0)
Hemoglobin: 13.1 g/dL (ref 12.0–15.0)
MCH: 27.5 pg (ref 26.0–34.0)
MCHC: 31.6 g/dL (ref 30.0–36.0)
MCV: 87 fL (ref 80.0–100.0)
Platelets: 308 10*3/uL (ref 150–400)
RBC: 4.76 MIL/uL (ref 3.87–5.11)
RDW: 13.7 % (ref 11.5–15.5)
WBC: 9.5 10*3/uL (ref 4.0–10.5)
nRBC: 0 % (ref 0.0–0.2)

## 2021-09-15 MED ORDER — ONDANSETRON 4 MG PO TBDP: 4.0000 mg | ORAL_TABLET | Freq: Four times a day (QID) | ORAL | Status: DC | PRN

## 2021-09-15 MED ORDER — LOSARTAN POTASSIUM 50 MG PO TABS
50.0000 mg | ORAL_TABLET | Freq: Every day | ORAL | Status: DC
Start: 2021-09-16 — End: 2021-09-23
  Administered 2021-09-16 – 2021-09-23 (×8): 50 mg via ORAL
  Filled 2021-09-15 (×9): qty 1

## 2021-09-15 MED ORDER — ENOXAPARIN SODIUM 40 MG/0.4ML IJ SOSY
40.0000 mg | PREFILLED_SYRINGE | INTRAMUSCULAR | Status: DC
  Administered 2021-09-16 – 2021-09-23 (×8): 40 mg via SUBCUTANEOUS
  Filled 2021-09-15 (×8): qty 0.4

## 2021-09-15 MED ORDER — HYDROCHLOROTHIAZIDE 12.5 MG PO TABS
12.5000 mg | ORAL_TABLET | Freq: Every day | ORAL | Status: DC
  Administered 2021-09-16 – 2021-09-23 (×8): 12.5 mg via ORAL
  Filled 2021-09-15 (×8): qty 1

## 2021-09-15 MED ORDER — ACETAMINOPHEN 650 MG RE SUPP: 650.0000 mg | Freq: Four times a day (QID) | RECTAL | Status: DC | PRN

## 2021-09-15 MED ORDER — HYDROMORPHONE HCL 1 MG/ML IJ SOLN
1.0000 mg | Freq: Once | INTRAMUSCULAR | Status: AC
  Administered 2021-09-15: 1 mg via INTRAVENOUS
  Filled 2021-09-15: qty 1

## 2021-09-15 MED ORDER — LACTATED RINGERS IV BOLUS
1000.0000 mL | Freq: Once | INTRAVENOUS | Status: AC
  Administered 2021-09-15: 1000 mL via INTRAVENOUS

## 2021-09-15 MED ORDER — ALBUTEROL SULFATE HFA 108 (90 BASE) MCG/ACT IN AERS: 1.0000 | INHALATION_SPRAY | Freq: Four times a day (QID) | RESPIRATORY_TRACT | Status: DC | PRN

## 2021-09-15 MED ORDER — MORPHINE SULFATE (PF) 4 MG/ML IV SOLN
4.0000 mg | INTRAVENOUS | Status: DC | PRN
  Administered 2021-09-15 – 2021-09-20 (×7): 4 mg via INTRAVENOUS
  Filled 2021-09-15 (×7): qty 1

## 2021-09-15 MED ORDER — HYDRALAZINE HCL 20 MG/ML IJ SOLN: 10.0000 mg | Freq: Four times a day (QID) | INTRAMUSCULAR | Status: DC | PRN

## 2021-09-15 MED ORDER — ACETAMINOPHEN 325 MG PO TABS: 650.0000 mg | ORAL_TABLET | Freq: Four times a day (QID) | ORAL | Status: DC | PRN

## 2021-09-15 MED ORDER — PANTOPRAZOLE SODIUM 40 MG IV SOLR
40.0000 mg | Freq: Every day | INTRAVENOUS | Status: DC
  Administered 2021-09-15 – 2021-09-16 (×2): 40 mg via INTRAVENOUS
  Filled 2021-09-15 (×2): qty 10

## 2021-09-15 MED ORDER — TRAMADOL HCL 50 MG PO TABS
50.0000 mg | ORAL_TABLET | Freq: Four times a day (QID) | ORAL | Status: DC | PRN
  Administered 2021-09-16 – 2021-09-20 (×2): 50 mg via ORAL
  Filled 2021-09-15 (×2): qty 1

## 2021-09-15 MED ORDER — IOHEXOL 300 MG/ML  SOLN
100.0000 mL | Freq: Once | INTRAMUSCULAR | Status: AC | PRN
  Administered 2021-09-15: 100 mL via INTRAVENOUS

## 2021-09-15 MED ORDER — ONDANSETRON HCL 4 MG/2ML IJ SOLN
4.0000 mg | Freq: Once | INTRAMUSCULAR | Status: AC
  Administered 2021-09-15: 4 mg via INTRAVENOUS
  Filled 2021-09-15: qty 2

## 2021-09-15 MED ORDER — ALBUTEROL SULFATE (2.5 MG/3ML) 0.083% IN NEBU: 2.5000 mg | INHALATION_SOLUTION | Freq: Four times a day (QID) | RESPIRATORY_TRACT | Status: DC | PRN

## 2021-09-15 MED ORDER — METHOCARBAMOL 500 MG PO TABS
500.0000 mg | ORAL_TABLET | Freq: Three times a day (TID) | ORAL | Status: DC | PRN
  Administered 2021-09-15 – 2021-09-23 (×8): 500 mg via ORAL
  Filled 2021-09-15 (×8): qty 1

## 2021-09-15 MED ORDER — ONDANSETRON HCL 4 MG/2ML IJ SOLN
4.0000 mg | Freq: Four times a day (QID) | INTRAMUSCULAR | Status: DC | PRN
  Administered 2021-09-15 – 2021-09-19 (×3): 4 mg via INTRAVENOUS
  Filled 2021-09-15 (×3): qty 2

## 2021-09-15 MED ORDER — POTASSIUM CHLORIDE IN NACL 20-0.9 MEQ/L-% IV SOLN
INTRAVENOUS | Status: DC
  Filled 2021-09-15 (×4): qty 1000

## 2021-09-15 MED ORDER — LOSARTAN POTASSIUM-HCTZ 50-12.5 MG PO TABS: 1.0000 | ORAL_TABLET | Freq: Every morning | ORAL | Status: DC

## 2021-09-15 NOTE — ED Provider Notes (Signed)
?[facility] EMERGENCY DEPARTMENT ?Provider Note ? ? ?CSN: [id number] ?Arrival date & time: 09/15/21  7106 ? ?  ? ?History ? ?Chief Complaint  ?Patient presents with  ? Abdominal Pain  ? ? ?Kaitlyn Bray is a 56 y.o. female. ? ?HPI ?56 year old female presents with abdominal pain.  On 4/14 she had incisional hernia repair and JP drain placement.  She has a history of hypertension as well.  She developed abdominal pain yesterday that she thinks worsened after eating.  She vomited twice last night.  Last bowel movement was yesterday.  Has persistent abdominal pain in her upper abdomen that worsens at times.  This is different from where her incision was made.  No chest pain, back pain, fever or urinary symptoms.  Called her surgeon and was advised to come to the ER.  She was due to get her drain out over the last couple days but the output was too high so it was left in. ? ?Home Medications ?Prior to Admission medications   ?Medication Sig Start Date End Date Taking? Authorizing Provider  ?albuterol (VENTOLIN HFA) 108 (90 Base) MCG/ACT inhaler Inhale 1-2 puffs into the lungs every 6 (six) hours as needed for wheezing or shortness of breath. 05/24/20  Yes Avegno, Zachery Dakins, FNP  ?ibuprofen (ADVIL) 200 MG tablet Take 200 mg by mouth every 4 (four) hours as needed for headache.   Yes [provider]  ?losartan-hydrochlorothiazide (HYZAAR) 50-12.5 MG tablet TAKE 1 TABLET BY MOUTH EVERY DAY ?Patient taking differently: Take 1 tablet by mouth every morning. 08/26/20  Yes Lorre Munroe, NP  ?methocarbamol (ROBAXIN) 500 MG tablet Take 1 tablet (500 mg total) by mouth every 8 (eight) hours as needed for muscle spasms. 09/05/21  Yes [name], MD  ?traMADol (ULTRAM) 50 MG tablet Take 2 tablets (100 mg total) by mouth every 6 (six) hours as needed for severe pain. 09/05/21  Yes [name], MD  ?   ? ?Allergies    ?Codeine, Latex, Penicillins, and Sulfa drugs cross reactors   ? ?Review of  Systems   ?Review of Systems  ?Constitutional:  Negative for fever.  ?Respiratory:  Negative for shortness of breath.   ?Cardiovascular:  Negative for chest pain.  ?Gastrointestinal:  Positive for abdominal pain and vomiting.  ?Musculoskeletal:  Negative for back pain.  ? ?Physical Exam ?Updated Vital Signs ?BP 137/78   Pulse 91   Temp 98.1 ?F (36.7 ?C) (Oral)   Resp 16   SpO2 95%  ?Physical Exam ?Vitals and nursing note reviewed.  ?Constitutional:   ?   Appearance: She is well-developed. She is obese.  ?HENT:  ?   Head: Normocephalic and atraumatic.  ?Cardiovascular:  ?   Rate and Rhythm: Normal rate and regular rhythm.  ?   Heart sounds: Normal heart sounds.  ?Pulmonary:  ?   Effort: Pulmonary effort is normal.  ?   Breath sounds: Normal breath sounds.  ?Abdominal:  ?   Palpations: Abdomen is soft.  ?   Tenderness: There is abdominal tenderness in the right upper quadrant, epigastric area and left upper quadrant.  ?   Comments: Midline incision appears C/D/I.   ?Skin: ?   General: Skin is warm and dry.  ?Neurological:  ?   Mental Status: She is alert.  ? ? ?ED Results / Procedures / Treatments   ?Labs ?(all labs ordered are listed, but only abnormal results are displayed) ?Labs Reviewed  ?COMPREHENSIVE METABOLIC PANEL - Abnormal; Notable for  the following components:  ?    Result Value  ? Glucose, Bld 112 (*)   ? Calcium 8.8 (*)   ? Total Protein 5.9 (*)   ? Albumin 3.0 (*)   ? All other components within normal limits  ?LIPASE, BLOOD  ?CBC  ? ? ?EKG ?None ? ?Radiology ?No results found. ? ?Procedures ?Procedures  ? ? ?Medications Ordered in ED ?Medications  ?lactated ringers bolus 1,000 mL (1,000 mLs Intravenous New Bag/Given 09/15/21 1510)  ?HYDROmorphone (DILAUDID) injection 1 mg (1 mg Intravenous Given 09/15/21 1512)  ?[facility]) injection 4 mg (4 mg Intravenous Given 09/15/21 1507)  ?iohexol (OMNIPAQUE) 300 MG/ML solution 100 mL (100 mLs Intravenous Contrast Given 09/15/21 1512)  ? ? ?ED Course/ Medical  Decision Making/ A&P ?  ?                        ?Medical Decision Making ?Amount and/or Complexity of Data Reviewed ?External Data Reviewed: notes. ?Labs: ordered. ?Radiology: ordered and independent interpretation performed. ? ?Risk ?Prescription drug management. ?Decision regarding hospitalization. ? ? ?Given pain and recent surgery along with vomiting, CT obtained.  I personally reviewed/interpreted these images and there is a small bowel obstruction.  Seems to be in the hernia.  Vitals are okay and she did have a good response to IV Dilaudid.  Was given IV fluids.  No current vomiting.  Discussed with general surgery, Dr. Janee Morn has been consulted, and he will admit.  Labs are overall unremarkable including normal WBC and normal renal function. ? ? ? ? ? ? ? ?Final Clinical Impression(s) / ED Diagnoses ?Final diagnoses:  ?Small bowel obstruction (HCC)  ? ? ?Rx / DC Orders ?ED Discharge Orders   ? ? None  ? ?  ? ? ?  ?Pricilla Loveless, MD ?09/15/21 1627 ? ?

## 2021-09-15 NOTE — ED Triage Notes (Signed)
Patient complains of abdominal pain, constipation, and intermittent nausea and vomiting that started a few days ago but got worse yesterday, reports having hernia surgery in mid-April this year. Patient is alert, oriented, ambulatory, and in no apparent distress at this time. ?

## 2021-09-15 NOTE — ED Notes (Signed)
This RN contacted [facility]N in regards to initiating purple man. Per 6N they will get to it as soon as they can.  ?

## 2021-09-15 NOTE — H&P (Signed)
Kaitlyn Bray is an 56 y.o. female.   ?Chief Complaint: crampy abdominal pain, N/V ?HPI: Patient is status post repair of recurrent incisional hernia with mesh 08/26/2021.  She has been doing well at home.  She still has a JP drain in place in the subcutaneous pocket.  She moved her bowels twice yesterday and passed a lot of gas, but overnight, developed a lot of crampy abdominal pain and nausea and vomiting.  She came to the emergency department for further evaluation.  Laboratory studies are normal.  CT scan of the abdomen pelvis, however, reveals an early postoperative small bowel obstruction and also small recurrence of her hernia which seems somewhat away from the obstruction.  I was asked to see her for admission. ? ?Past Medical History:  ?Diagnosis Date  ? Asthma   ? Complication of anesthesia   ? Heart murmur   ? Hernia   ? Hypertension   ? PONV (postoperative nausea and vomiting)   ? Pre-diabetes   ? ? ?Past Surgical History:  ?Procedure Laterality Date  ? ABDOMINAL HYSTERECTOMY    ? APPENDECTOMY    ? CESAREAN SECTION    ? INCISIONAL HERNIA REPAIR N/A 08/26/2021  ? Procedure: INCISIONAL HERNIA REPAIR;  Surgeon: Georganna Skeans, MD;  Location: [facility];  Service: General;  Laterality: N/A;  ? INSERTION OF MESH N/A 08/26/2021  ? Procedure: INSERTION OF MESH;  Surgeon: Georganna Skeans, MD;  Location: [facility];  Service: General;  Laterality: N/A;  ? LAPAROTOMY N/A 07/29/2020  ? Procedure: EXPLORATORY LAPAROTOMY, REPAIR OF VENTRAL HERNIA;  Surgeon: Georganna Skeans, MD;  Location: [place];  Service: General;  Laterality: N/A;  ? MYOMECTOMY    ? ? ?Family History  ?Problem Relation Age of Onset  ? Hypertension Mother   ? Stroke Mother   ? Dementia Mother   ? Heart disease Mother   ? Hyperlipidemia Mother   ? COPD Father   ? Arthritis Father   ? Mental illness Father   ? Heart disease Maternal Grandmother   ? Heart disease Maternal Grandfather   ? Alzheimer's disease Maternal Grandfather   ? Uterine cancer Paternal  Grandmother   ? ?Social History:  reports that she has never smoked. She has never used smokeless tobacco. She reports that she does not drink alcohol and does not use drugs. ? ?Allergies:  ?Allergies  ?Allergen Reactions  ? Codeine Other (See Comments)  ?  Fast heart rate  ? Latex Other (See Comments)  ?  Redness from bandaids  ? Penicillins Rash  ? Sulfa Drugs Cross Reactors Rash  ? ? ?(Not in a hospital admission) ? ? ?Results for orders placed or performed during the hospital encounter of 09/15/21 (from the past 48 hour(s))  ?Lipase, blood     Status: None  ? Collection Time: 09/15/21  7:32 AM  ?Result Value Ref Range  ? Lipase 21 11 - 51 U/L  ?  Comment: Performed at [facility], [place] [address] [place], [place] [address]  ?Comprehensive metabolic panel     Status: Abnormal  ? Collection Time: 09/15/21  7:32 AM  ?Result Value Ref Range  ? Sodium 137 135 - 145 mmol/L  ? Potassium 3.6 3.5 - 5.1 mmol/L  ? Chloride 99 98 - 111 mmol/L  ? CO2 29 22 - 32 mmol/L  ? Glucose, Bld 112 (H) 70 - 99 mg/dL  ?  Comment: Glucose reference range applies only to samples taken after fasting for at least 8 hours.  ?  BUN 10 6 - 20 mg/dL  ? Creatinine, Ser 0.61 0.44 - 1.00 mg/dL  ? Calcium 8.8 (L) 8.9 - 10.3 mg/dL  ? Total Protein 5.9 (L) 6.5 - 8.1 g/dL  ? Albumin 3.0 (L) 3.5 - 5.0 g/dL  ? AST 24 15 - 41 U/L  ? ALT 37 0 - 44 U/L  ? Alkaline Phosphatase 68 38 - 126 U/L  ? Total Bilirubin 0.3 0.3 - 1.2 mg/dL  ? GFR, Estimated >60 >60 mL/min  ?  Comment: (NOTE) ?Calculated using the CKD-EPI Creatinine Equation (2021) ?  ? Anion gap 9 5 - 15  ?  Comment: Performed at [facility], [place] [address], [place], [place] [address]  ?CBC     Status: None  ? Collection Time: 09/15/21  7:32 AM  ?Result Value Ref Range  ? WBC 9.5 4.0 - 10.5 K/uL  ? RBC 4.76 3.87 - 5.11 MIL/uL  ? Hemoglobin 13.1 12.0 - 15.0 g/dL  ? HCT 41.4 36.0 - 46.0 %  ? MCV 87.0 80.0 - 100.0 fL  ? MCH 27.5 26.0 - 34.0 pg  ? MCHC 31.6 30.0 - 36.0 g/dL  ? RDW 13.7 11.5  - 15.5 %  ? Platelets 308 150 - 400 K/uL  ? nRBC 0.0 0.0 - 0.2 %  ?  Comment: Performed at [facility], [place] [address] [place], [place] [address]  ? ?No results found. ? ?Review of Systems  ?Constitutional:  Positive for appetite change.  ?HENT: Negative.    ?Eyes: Negative.   ?Respiratory: Negative.    ?Cardiovascular: Negative.   ?Gastrointestinal:  Positive for abdominal pain, nausea and vomiting.  ?Endocrine: Negative.   ?Genitourinary: Negative.   ?Musculoskeletal: Negative.   ?Skin: Negative.   ?Allergic/Immunologic: Negative.   ?Neurological: Negative.   ?Hematological: Negative.   ?Psychiatric/Behavioral: Negative.    ? ?Blood pressure 137/78, pulse 91, temperature 98.1 ?F (36.7 ?C), temperature source Oral, resp. rate 16, SpO2 95 %. ?Physical Exam ?Constitutional:   ?   Appearance: She is obese.  ?HENT:  ?   Head: Normocephalic.  ?Eyes:  ?   Comments: Disconjugate gaze is chronic  ?Cardiovascular:  ?   Rate and Rhythm: Normal rate and regular rhythm.  ?Pulmonary:  ?   Effort: Pulmonary effort is normal.  ?   Breath sounds: Normal breath sounds.  ?Abdominal:  ?   General: There is distension.  ?   Palpations: Abdomen is soft.  ?   Tenderness: There is no abdominal tenderness.  ?   Comments: Soft, incision is clean dry and intact, JP output is serous.  Some distention is present but she is not tender.  Hernia recurrence is not really apparent on exam.  ?Skin: ?   General: Skin is warm and dry.  ?Neurological:  ?   Mental Status: She is alert and oriented to person, place, and time.  ?Psychiatric:     ?   Mood and Affect: Mood normal.  ?  ? ?Assessment/Plan ?Status post repair of recurrent incisional hernia with mesh 08/26/2021 ?Early postoperative small bowel obstruction ?Some recurrence of her hernia  ? ?3 weeks post-op is a somewhat dangerous time to reexplore her.  She has a normal white count and is nontender on exam.  Her stomach is empty so I do not feel an NG tube would be useful.  Will admit  for bowel rest and IV fluids.  We will follow-up her labs and examination.  I reviewed her case and  CT scans with my partner, Dr. Thermon Leyland, who is a hernia specialist.  It will be optimal to get her over this early postoperative obstruction and plan an advanced approach for further hernia repair in the future.  I discussed the plan in detail with her and answered her questions. ? ?Zenovia Jarred, MD ?09/15/2021, 4:36 PM ? ? ? ?

## 2021-09-15 NOTE — ED Notes (Signed)
GOT PATIENT INTO A GOWN ON THE MONITOR  ?

## 2021-09-15 NOTE — ED Notes (Signed)
Patient transported to CT 

## 2021-09-15 NOTE — ED Notes (Signed)
This RN spoke with [facility] x2 and they stated that the RN was busy and could not take report at this time. They also did not know the RN's name that was taking over for patient so could not initiate purple man for report at this time.  ?

## 2021-09-16 LAB — CBC
HCT: 36.1 % (ref 36.0–46.0)
Hemoglobin: 11.8 g/dL — ABNORMAL LOW (ref 12.0–15.0)
MCH: 28.4 pg (ref 26.0–34.0)
MCHC: 32.7 g/dL (ref 30.0–36.0)
MCV: 86.8 fL (ref 80.0–100.0)
Platelets: 250 10*3/uL (ref 150–400)
RBC: 4.16 MIL/uL (ref 3.87–5.11)
RDW: 13.8 % (ref 11.5–15.5)
WBC: 8.6 10*3/uL (ref 4.0–10.5)
nRBC: 0 % (ref 0.0–0.2)

## 2021-09-16 LAB — BASIC METABOLIC PANEL
Anion gap: 9 (ref 5–15)
BUN: 9 mg/dL (ref 6–20)
CO2: 28 mmol/L (ref 22–32)
Calcium: 8.3 mg/dL — ABNORMAL LOW (ref 8.9–10.3)
Chloride: 102 mmol/L (ref 98–111)
Creatinine, Ser: 0.61 mg/dL (ref 0.44–1.00)
GFR, Estimated: >60 mL/min (ref >60)
Glucose, Bld: 86 mg/dL (ref 70–99)
Potassium: 3.7 mmol/L (ref 3.5–5.1)
Sodium: 139 mmol/L (ref 135–145)

## 2021-09-16 LAB — HIV ANTIBODY (ROUTINE TESTING W REFLEX): HIV Screen 4th Generation wRfx: NONREACTIVE

## 2021-09-16 NOTE — TOC Progression Note (Signed)
Transition of Care (TOC) - Progression Note  ? ? ?Patient Details  ?Name: Kaitlyn Bray ?MRN: [id number] ?Date of Birth: 12-13-1965 ? ?Transition of Care (TOC) CM/SW Contact  ?Kingsley Plan, RN ?Phone Number: ?09/16/2021, 11:07 AM ? ?Clinical Narrative:    ? ? ? ?Transition of Care (TOC) Screening Note ? ? ?Patient Details  ?Name: Kaitlyn Bray ?Date of Birth: 1966/01/01 ? ? ? ?Transition of Care Department [facility]) has reviewed patient and no TOC needs have been identified at this time. We will continue to monitor patient advancement through interdisciplinary progression rounds. If new patient transition needs arise, please place a TOC consult. ?  ?  ?  ? ?Expected Discharge Plan and Services ?  ?  ?  ?  ?  ?                ?  ?  ?  ?  ?  ?  ?  ?  ?  ?  ? ? ?Social Determinants of Health (SDOH) Interventions ?  ? ?Readmission Risk Interventions ?   ? View : No data to display.  ?  ?  ?  ? ? ?

## 2021-09-16 NOTE — Plan of Care (Signed)
  Problem: Health Behavior/Discharge Planning: Goal: Ability to manage health-related needs will improve Outcome: Progressing   

## 2021-09-16 NOTE — Progress Notes (Signed)
Mobility Specialist Progress Note: ? ? 09/16/21 1242  ?Mobility  ?Activity Ambulated with assistance in hallway  ?Level of Assistance Independent  ?Assistive Device None  ?Distance Ambulated (ft) 570 ft  ?Activity Response Tolerated well  ?$Mobility charge 1 Mobility  ? ?Pt received by room walking with IV pole. No complaints of pain. Left in bed with call bell in reach and all needs met.  ? ?Kaitlyn Bray ?Mobility Specialist ?Primary Phone [phone] ? ?

## 2021-09-16 NOTE — Progress Notes (Signed)
Patient ID: Kaitlyn Bray, female   DOB: 1965/06/24, 56 y.o.   MRN: [id number] ?   ?  ?Subjective: ?Multiple liquid BMs overnight and this AM. Some crampy abd pain. ?ROS negative except as listed above. ?Objective: ?Vital signs in last 24 hours: ?Temp:  [97.5 ?F (36.4 ?C)-98.1 ?F (36.7 ?C)] 97.6 ?F (36.4 ?C) (05/05 0430) ?Pulse Rate:  [74-95] 81 (05/05 0430) ?Resp:  [16-18] 18 (05/05 0430) ?BP: (121-149)/(75-91) 149/85 (05/05 0430) ?SpO2:  [91 %-97 %] 96 % (05/05 0430) ?Weight:  [109.6 kg] 109.6 kg (05/04 2000) ?Last BM Date : 09/14/21 ? ?Intake/Output from previous day: ?05/04 0701 - 05/05 0700 ?In: 569.7 [P.O.:50; I.V.:519.7] ?Out: 30 [Drains:30] ?Intake/Output this shift: ?No intake/output data recorded. ? ?General appearance: alert and cooperative ?Resp: clear to auscultation bilaterally ?Cardio: regular rate and rhythm ?GI: soft, nt ?Extremities: calves soft ? ?Lab Results: ?CBC  ?Recent Labs  ?  09/15/21 ?0732 09/16/21 ?0123  ?WBC 9.5 8.6  ?HGB 13.1 11.8*  ?HCT 41.4 36.1  ?PLT 308 250  ? ?BMET ?Recent Labs  ?  09/15/21 ?0732 09/16/21 ?0123  ?NA 137 139  ?K 3.6 3.7  ?CL 99 102  ?CO2 29 28  ?GLUCOSE 112* 86  ?BUN 10 9  ?CREATININE 0.61 0.61  ?CALCIUM 8.8* 8.3*  ? ?PT/INR ?No results for input(s): LABPROT, INR in the last 72 hours. ?ABG ?No results for input(s): PHART, HCO3 in the last 72 hours. ? ?Invalid input(s): PCO2, PO2 ? ?Studies/Results: ? ?Anti-infectives: ?Anti-infectives (From admission, onward)  ? ? None  ? ?  ? ? ?Assessment/Plan: ?Status post repair of recurrent incisional hernia with mesh 08/26/2021 ? ?Early postoperative small bowel obstruction - improving, multiple BMs, try clears, advance slowly, likely D/C drain tomorrow ?Some recurrence of her hernia - plan advanced repair by Dr. Dossie Der in the future ?HTN - home med ? ?FEN - CL ?VTE - LMWH ?Dispo - AROBF ? LOS: 1 day  ? ? ?[name], MD, MPH, FACS ?Trauma & General Surgery ?Use [email or web address] to contact on call provider ? ?09/16/2021  ?

## 2021-09-17 LAB — BASIC METABOLIC PANEL
Anion gap: 6 (ref 5–15)
BUN: 5 mg/dL — ABNORMAL LOW (ref 6–20)
CO2: 28 mmol/L (ref 22–32)
Calcium: 8.5 mg/dL — ABNORMAL LOW (ref 8.9–10.3)
Chloride: 106 mmol/L (ref 98–111)
Creatinine, Ser: 0.66 mg/dL (ref 0.44–1.00)
GFR, Estimated: >60 mL/min (ref >60)
Glucose, Bld: 88 mg/dL (ref 70–99)
Potassium: 3.8 mmol/L (ref 3.5–5.1)
Sodium: 140 mmol/L (ref 135–145)

## 2021-09-17 LAB — CBC
HCT: 37.5 % (ref 36.0–46.0)
Hemoglobin: 11.8 g/dL — ABNORMAL LOW (ref 12.0–15.0)
MCH: 28 pg (ref 26.0–34.0)
MCHC: 31.5 g/dL (ref 30.0–36.0)
MCV: 89.1 fL (ref 80.0–100.0)
Platelets: 242 10*3/uL (ref 150–400)
RBC: 4.21 MIL/uL (ref 3.87–5.11)
RDW: 14 % (ref 11.5–15.5)
WBC: 7.5 10*3/uL (ref 4.0–10.5)
nRBC: 0 % (ref 0.0–0.2)

## 2021-09-17 MED ORDER — PANTOPRAZOLE SODIUM 40 MG PO TBEC
40.0000 mg | DELAYED_RELEASE_TABLET | Freq: Every day | ORAL | Status: DC
  Administered 2021-09-17 – 2021-09-22 (×6): 40 mg via ORAL
  Filled 2021-09-17 (×6): qty 1

## 2021-09-17 NOTE — Progress Notes (Signed)
?   ? ?Chief Complaint/Subjective: ?Tolerating liquids, no diarrhea overnight, +flatus ? ?Objective: ?Vital signs in last 24 hours: ?Temp:  [97.5 ?F (36.4 ?C)-98.2 ?F (36.8 ?C)] 98.2 ?F (36.8 ?C) (05/06 0745) ?Pulse Rate:  [71-81] 76 (05/06 0745) ?Resp:  [17-18] 17 (05/06 0745) ?BP: (121-142)/(67-87) 141/87 (05/06 0745) ?SpO2:  [93 %-97 %] 96 % (05/06 0745) ?Last BM Date : 09/16/21 ?Intake/Output from previous day: ?05/05 0701 - 05/06 0700 ?In: 2531 [P.O.:270; I.V.:2261] ?Out: 40 [Drains:40] ?Intake/Output this shift: ?No intake/output data recorded. ? ?PE: ?Gen: NAD ?Resp: nonlabored ?Card: RRR ?Abd: soft, incision c/d/i ? ?Lab Results:  ?Recent Labs  ?  09/16/21 ?0123 09/17/21 ?[id number]  ?WBC 8.6 7.5  ?HGB 11.8* 11.8*  ?HCT 36.1 37.5  ?PLT 250 242  ? ?BMET ?Recent Labs  ?  09/16/21 ?0123 09/17/21 ?[id number]  ?NA 139 140  ?K 3.7 3.8  ?CL 102 106  ?CO2 28 28  ?GLUCOSE 86 88  ?BUN 9 5*  ?CREATININE 0.61 0.66  ?CALCIUM 8.3* 8.5*  ? ?PT/INR ?No results for input(s): LABPROT, INR in the last 72 hours. ?CMP  ?   ?Component Value Date/Time  ? NA 140 09/17/2021 0043  ? K 3.8 09/17/2021 0043  ? CL 106 09/17/2021 0043  ? CO2 28 09/17/2021 0043  ? GLUCOSE 88 09/17/2021 0043  ? BUN 5 (L) 09/17/2021 0043  ? CREATININE 0.66 09/17/2021 0043  ? CALCIUM 8.5 (L) 09/17/2021 0043  ? PROT 5.9 (L) 09/15/2021 0732  ? ALBUMIN 3.0 (L) 09/15/2021 0732  ? AST 24 09/15/2021 0732  ? ALT 37 09/15/2021 0732  ? ALKPHOS 68 09/15/2021 0732  ? BILITOT 0.3 09/15/2021 0732  ? GFRNONAA >60 09/17/2021 0043  ? GFRAA >60 04/05/2018 1031  ? ?Lipase  ?   ?Component Value Date/Time  ? LIPASE 21 09/15/2021 0732  ? ? ?Studies/Results: ?CT ABDOMEN PELVIS W CONTRAST ? ?Result Date: 09/15/2021 ?CLINICAL DATA:  Nausea and vomiting EXAM: CT ABDOMEN AND PELVIS WITH CONTRAST TECHNIQUE: Multidetector CT imaging of the abdomen and pelvis was performed using the standard protocol following bolus administration of intravenous contrast. RADIATION DOSE REDUCTION: This exam was  performed according to the departmental dose-optimization program which includes automated exposure control, adjustment of the mA and/or kV according to patient size and/or use of iterative reconstruction technique. CONTRAST:  182mL OMNIPAQUE IOHEXOL 300 MG/ML  SOLN COMPARISON:  CT abdomen and pelvis dated June 06, 2021 FINDINGS: Lower chest: No acute abnormality. Hepatobiliary: No focal liver abnormality is seen. No gallstones, gallbladder wall thickening, or biliary dilatation. Pancreas: Unremarkable. No pancreatic ductal dilatation or surrounding inflammatory changes. Spleen: Normal in size without focal abnormality. Adrenals/Urinary Tract: Bilateral adrenal glands are unremarkable. Low-attenuation lesion of the upper pole of the left kidney is decreased in size when compared with prior exam, measures 8 mm on series 6, image 80, previously 1.5 cm, likely due to interval rupture of simple renal cysts. Lesion of the mid region of the left kidney is unchanged in size when compared with prior exam and too small to completely characterize. Stomach/Bowel: Stomach is normal in appearance. Multiple dilated loops of proximal small bowel are seen with a transition point at the entrance of a large ventral abdominal wall hernia seen on series 3, image 70. Diverticulosis. No wall thickening or inflammatory change. Vascular/Lymphatic: Mild aortic atherosclerosis. No enlarged abdominal or pelvic lymph nodes. Reproductive: Prior hysterectomy.  No adnexal mass. Other: Postsurgical changes of interval hernia repair with loculated anterior abdominal wall fluid which is likely due to  seroma. Overall size of hernia is decreased when compared with prior exam although there is a persistent hernia sac which is concerning for recurrence. No free intraperitoneal fluid or free air. Musculoskeletal: No acute or significant osseous findings. IMPRESSION: 1. Multiple dilated loops of small bowel with transition point at the site of a  ventral abdominal hernia. Findings are compatible with small-bowel obstruction. 2. Postsurgical changes of interval hernia repair with loculated anterior abdominal wall fluid which is likely due to seroma. Overall size of hernia is decreased when compared with prior exam although there is a persistent large hernia sac which is concerning for recurrence. Electronically Signed   By: Yetta Glassman M.D.   On: 09/15/2021 15:38   ? ?Anti-infectives: ?Anti-infectives (From admission, onward)  ? ? None  ? ?  ? ? ?Assessment/Plan ?Ventral hernia repair 4/14 readmitted with early SBO ?FEN - full liquids ?VTE - lovenox ?ID - no issues ?Disposition - slowly advancing diet ? ? LOS: 2 days  ? ?I reviewed last 24 h vitals and pain scores, last 48 h intake and output, last 24 h labs and trends, and last 24 h imaging results. ? ?This care required moderate level of medical decision making.  ? ?Arta Bruce Drezden Seitzinger ?[facility] ?09/17/2021, 8:27 AM ?Please see Amion for pager number during day hours 7:00am-4:30pm or 7:00am -11:30am on weekends ? ? ?

## 2021-09-17 NOTE — Progress Notes (Signed)
Mobility Specialist: Progress Note ? ? 09/17/21 1735  ?Mobility  ?Activity Ambulated independently in hallway  ?Level of Assistance Independent  ?Assistive Device None  ?Distance Ambulated (ft) 570 ft  ?Activity Response Tolerated well  ?$Mobility charge 1 Mobility  ? ?Received pt in chair having no complaints and agreeable to mobility. Asymptomatic throughout ambulation, returned back to chair w/ call bell in reach and all needs met. ? ?Harrell Gave Kai Railsback ?Mobility Specialist ?Mobility Specialist [place]: [phone] ?Mobility Specialist [place]: [phone] ? ?

## 2021-09-18 LAB — BASIC METABOLIC PANEL
Anion gap: 7 (ref 5–15)
BUN: <5 mg/dL — ABNORMAL LOW (ref 6–20)
CO2: 28 mmol/L (ref 22–32)
Calcium: 8.7 mg/dL — ABNORMAL LOW (ref 8.9–10.3)
Chloride: 106 mmol/L (ref 98–111)
Creatinine, Ser: 0.7 mg/dL (ref 0.44–1.00)
GFR, Estimated: >60 mL/min (ref >60)
Glucose, Bld: 90 mg/dL (ref 70–99)
Potassium: 3.6 mmol/L (ref 3.5–5.1)
Sodium: 141 mmol/L (ref 135–145)

## 2021-09-18 LAB — CBC
HCT: 38.7 % (ref 36.0–46.0)
Hemoglobin: 12.1 g/dL (ref 12.0–15.0)
MCH: 27.5 pg (ref 26.0–34.0)
MCHC: 31.3 g/dL (ref 30.0–36.0)
MCV: 88 fL (ref 80.0–100.0)
Platelets: 243 10*3/uL (ref 150–400)
RBC: 4.4 MIL/uL (ref 3.87–5.11)
RDW: 13.8 % (ref 11.5–15.5)
WBC: 7.8 10*3/uL (ref 4.0–10.5)
nRBC: 0 % (ref 0.0–0.2)

## 2021-09-18 NOTE — Progress Notes (Signed)
**Note Kaitlyn-Identified via Obfuscation** ?   ? ?  Chief Complaint/Subjective: ?Tolerating liquids, 1 normal bowel movement today, ambulating in the halls ? ?Objective: ?Vital signs in last 24 hours: ?Temp:  [97.8 ?F (36.6 ?C)-98.3 ?F (36.8 ?C)] 98.3 ?F (36.8 ?C) (05/07 0910) ?Pulse Rate:  [81] 81 (05/07 0910) ?Resp:  [17-18] 18 (05/07 0910) ?BP: (137-153)/(77-89) 153/89 (05/07 0910) ?SpO2:  [94 %-97 %] 94 % (05/07 0910) ?Last BM Date : 09/17/21 ?Intake/Output from previous day: ?05/06 0701 - 05/07 0700 ?In: 240 [P.O.:240] ?Out: 71 [Drains:55] ?Intake/Output this shift: ?No intake/output data recorded. ? ?PE: ?Gen: NAD ?Resp: nonlabored ?Card: RRR ?Abd: incision c/d/I, drain with small amount straw colored drainage ? ?Lab Results:  ?Recent Labs  ?  09/17/21 ?3710 09/18/21 ?0053  ?WBC 7.5 7.8  ?HGB 11.8* 12.1  ?HCT 37.5 38.7  ?PLT 242 243  ? ?BMET ?Recent Labs  ?  09/17/21 ?6269 09/18/21 ?0053  ?NA 140 141  ?K 3.8 3.6  ?CL 106 106  ?CO2 28 28  ?GLUCOSE 88 90  ?BUN 5* <5*  ?CREATININE 0.66 0.70  ?CALCIUM 8.5* 8.7*  ? ?PT/INR ?No results for input(s): LABPROT, INR in the last 72 hours. ?CMP  ?   ?Component Value Date/Time  ? NA 141 09/18/2021 0053  ? K 3.6 09/18/2021 0053  ? CL 106 09/18/2021 0053  ? CO2 28 09/18/2021 0053  ? GLUCOSE 90 09/18/2021 0053  ? BUN <5 (L) 09/18/2021 0053  ? CREATININE 0.70 09/18/2021 0053  ? CALCIUM 8.7 (L) 09/18/2021 0053  ? PROT 5.9 (L) 09/15/2021 0732  ? ALBUMIN 3.0 (L) 09/15/2021 0732  ? AST 24 09/15/2021 0732  ? ALT 37 09/15/2021 0732  ? ALKPHOS 68 09/15/2021 0732  ? BILITOT 0.3 09/15/2021 0732  ? GFRNONAA >60 09/18/2021 0053  ? GFRAA >60 04/05/2018 1031  ? ?Lipase  ?   ?Component Value Date/Time  ? LIPASE 21 09/15/2021 0732  ? ? ?Studies/Results: ?No results found. ? ?Anti-infectives: ?Anti-infectives (From admission, onward)  ? ? None  ? ?  ? ? ?Assessment/Plan ? Ventral hernia repair 4/14 readmitted with early SBO ?FEN -  carb modified diet ?VTE - lovenox ?ID - no issues ?Disposition - likely home tomorrow, possible drain  removal tomorrow ? ? LOS: 3 days  ? ?I reviewed last 24 h vitals and pain scores, last 48 h intake and output, last 24 h labs and trends, and last 24 h imaging results. ? ?This care required moderate level of medical decision making.  ? ?Kaitlyn Bray ?[facility] ?09/18/2021, 9:18 AM ?Please see Amion for pager number during day hours 7:00am-4:30pm or 7:00am -11:30am on weekends ? ? ?

## 2021-09-19 MED ORDER — SODIUM CHLORIDE 0.45 % IV SOLN: INTRAVENOUS | Status: DC

## 2021-09-19 MED ORDER — POTASSIUM CHLORIDE IN NACL 20-0.45 MEQ/L-% IV SOLN
INTRAVENOUS | Status: DC
  Filled 2021-09-19 (×5): qty 1000

## 2021-09-19 NOTE — Progress Notes (Signed)
Pt has required 2 doses of Morphine and 1 dose of Zofran since 3am for abdominal pain.  BS remain active and audible.  Encouraged pt to hold off on any po intake for the time being. ?Hilton Sinclair BSN RN CMSRN ?09/19/2021, 6:26 AM ? ?

## 2021-09-19 NOTE — Progress Notes (Signed)
?   09/19/21 1200  ?Clinical Encounter Type  ?Visited With Patient  ?Visit Type Initial;Spiritual support  ?Referral From Chaplain  ?Consult/Referral To Chaplain  ? ?Chaplain responded to a patient request for a Bible. Dropped off a NT/Psalms. Patient, Kaitlyn Bray, was very happy to receive the book. Kaitlyn Bray shared that her project for the year is prayer and the book was what she was looking for. She shared that she is disappointed about the setback she experienced recently in her recovery but is determined to get better.  ? ?Valerie Roys ?Chaplain  ?[facility] ?[phone] ?

## 2021-09-19 NOTE — Progress Notes (Signed)
? ?  Subjective/Chief Complaint: ?Some crampy pain overnight, small BM yesterday AM ? ? ?Objective: ?Vital signs in last 24 hours: ?Temp:  [97.7 ?F (36.5 ?C)-98.3 ?F (36.8 ?C)] 97.7 ?F (36.5 ?C) (05/08 4854) ?Pulse Rate:  [70-81] 70 (05/08 6270) ?Resp:  [18-20] 20 (05/08 3500) ?BP: (125-155)/(74-93) 145/74 (05/08 9381) ?SpO2:  [94 %-96 %] 95 % (05/08 8299) ?Last BM Date : 09/17/21 ? ?Intake/Output from previous day: ?05/07 0701 - 05/08 0700 ?In: 240 [P.O.:240] ?Out: 20 [Drains:20] ?Intake/Output this shift: ?No intake/output data recorded. ? ?General appearance: cooperative ?Resp: clear to auscultation bilaterally ?GI: softer, NT, JP removed and I placed a dressing ?Extremities: calves soft ? ?Lab Results:  ?Recent Labs  ?  09/17/21 ?3716 09/18/21 ?0053  ?WBC 7.5 7.8  ?HGB 11.8* 12.1  ?HCT 37.5 38.7  ?PLT 242 243  ? ?BMET ?Recent Labs  ?  09/17/21 ?9678 09/18/21 ?0053  ?NA 140 141  ?K 3.8 3.6  ?CL 106 106  ?CO2 28 28  ?GLUCOSE 88 90  ?BUN 5* <5*  ?CREATININE 0.66 0.70  ?CALCIUM 8.5* 8.7*  ? ?PT/INR ?No results for input(s): LABPROT, INR in the last 72 hours. ?ABG ?No results for input(s): PHART, HCO3 in the last 72 hours. ? ?Invalid input(s): PCO2, PO2 ? ?Studies/Results: ?No results found. ? ?Anti-infectives: ?Anti-infectives (From admission, onward)  ? ? None  ? ?  ? ? ?Assessment/Plan: ?Ventral hernia repair 4/14 readmitted with early SBO - not totally opened up. Back down to clears and give it some time ?HTN - home med ?FEN -  CL, resume some IVF ?VTE - lovenox ?ID - no issues ?Disposition - AROBF, labs in AM ? ? LOS: 4 days  ? ? ?Liz Malady ?09/19/2021 ? ?

## 2021-09-19 NOTE — Plan of Care (Signed)

## 2021-09-19 NOTE — Progress Notes (Signed)
Mobility Specialist Progress Note  ? ? 09/19/21 1441  ?Mobility  ?Activity Refused mobility  ? ?Pt stated she is hurting and has already been up multiple times today.  ? ?[place] ?Mobility Specialist  ?Primary: 5N M.S. Phone: [phone] ?Secondary: 6N M.S. Phone: [phone] ?  ?

## 2021-09-20 LAB — BASIC METABOLIC PANEL
Anion gap: 9 (ref 5–15)
BUN: 5 mg/dL — ABNORMAL LOW (ref 6–20)
CO2: 28 mmol/L (ref 22–32)
Calcium: 8.9 mg/dL (ref 8.9–10.3)
Chloride: 102 mmol/L (ref 98–111)
Creatinine, Ser: 0.72 mg/dL (ref 0.44–1.00)
GFR, Estimated: >60 mL/min (ref >60)
Glucose, Bld: 83 mg/dL (ref 70–99)
Potassium: 3.8 mmol/L (ref 3.5–5.1)
Sodium: 139 mmol/L (ref 135–145)

## 2021-09-20 LAB — CBC
HCT: 40.7 % (ref 36.0–46.0)
Hemoglobin: 12.9 g/dL (ref 12.0–15.0)
MCH: 27.9 pg (ref 26.0–34.0)
MCHC: 31.7 g/dL (ref 30.0–36.0)
MCV: 87.9 fL (ref 80.0–100.0)
Platelets: 266 10*3/uL (ref 150–400)
RBC: 4.63 MIL/uL (ref 3.87–5.11)
RDW: 14.1 % (ref 11.5–15.5)
WBC: 8.8 10*3/uL (ref 4.0–10.5)
nRBC: 0 % (ref 0.0–0.2)

## 2021-09-20 NOTE — Progress Notes (Signed)
Patient ID: Kaitlyn Bray, female   DOB: 10-19-1965, 56 y.o.   MRN: [id number] ?   ?  ?Subjective: ?No pain, hungry, passed a lot of gas, no BM yesterday ?ROS negative except as listed above. ?Objective: ?Vital signs in last 24 hours: ?Temp:  [98 ?F (36.7 ?C)-98.6 ?F (37 ?C)] 98.6 ?F (37 ?C) (05/09 0525) ?Pulse Rate:  [72-81] 75 (05/09 0525) ?Resp:  [16-17] 17 (05/09 0525) ?BP: (103-137)/(69-94) 118/77 (05/09 0525) ?SpO2:  [89 %-97 %] 95 % (05/09 0525) ?Last BM Date : 09/18/21 ? ?Intake/Output from previous day: ?05/08 0701 - 05/09 0700 ?In: 479.1 [I.V.:479.1] ?Out: -  ?Intake/Output this shift: ?No intake/output data recorded. ? ?General appearance: alert and cooperative ?Resp: clear to auscultation bilaterally ?GI: softer, incision CDI, NT ?Extremities: calves soft ? ?Lab Results: ?CBC  ?Recent Labs  ?  09/18/21 ?0053 09/20/21 ?0109  ?WBC 7.8 8.8  ?HGB 12.1 12.9  ?HCT 38.7 40.7  ?PLT 243 266  ? ?BMET ?Recent Labs  ?  09/18/21 ?0053 09/20/21 ?0109  ?NA 141 139  ?K 3.6 3.8  ?CL 106 102  ?CO2 28 28  ?GLUCOSE 90 83  ?BUN <5* 5*  ?CREATININE 0.70 0.72  ?CALCIUM 8.7* 8.9  ? ?PT/INR ?No results for input(s): LABPROT, INR in the last 72 hours. ?ABG ?No results for input(s): PHART, HCO3 in the last 72 hours. ? ?Invalid input(s): PCO2, PO2 ? ?Studies/Results: ?No results found. ? ?Anti-infectives: ?Anti-infectives (From admission, onward)  ? ? None  ? ?  ? ? ?Assessment/Plan: ?Ventral hernia repair 4/14 readmitted with early SBO - improving, advance to fulls, labs WNL ?HTN - home med ?FEN -  fulls ?VTE - lovenox ?ID - no issues ?Disposition - AROBF ? ? LOS: 5 days  ? ? ?[name], MD, MPH, FACS ?Trauma & General Surgery ?Use [email or web address] to contact on call provider ? ?09/20/2021  ?

## 2021-09-20 NOTE — Progress Notes (Signed)
Mobility Specialist Progress Note: ? ? 09/20/21 1019  ?Mobility  ?Activity Ambulated with assistance in hallway  ?Level of Assistance Independent  ?Assistive Device None  ?Distance Ambulated (ft) 1120 ft  ?Activity Response Tolerated well  ?$Mobility charge 1 Mobility  ? ?Pt received in chair willing to participate in mobility. Complaints of 4/10 abdominal pain. Left in chair with call bell in reach and all needs met.  ? ?Cypher Paule ?Mobility Specialist ?Primary Phone [phone] ? ?

## 2021-09-21 NOTE — Progress Notes (Signed)
Mobility Specialist Progress Note: ? ? 09/21/21 0944  ?Mobility  ?Activity Ambulated with assistance in hallway  ?Level of Assistance Independent  ?Assistive Device None  ?Distance Ambulated (ft) 570 ft  ?Activity Response Tolerated well  ?$Mobility charge 1 Mobility  ? ?Pt received in chair willing to participate in mobility. No complaints of pain. Left in chair with call bell in reach and all needs met.  ? ?Kaitlyn Bray ?Mobility Specialist ?Primary Phone [phone] ? ?

## 2021-09-21 NOTE — Progress Notes (Signed)
Patient ID: Kaitlyn Bray, female   DOB: 22-Mar-1966, 56 y.o.   MRN: [id number] ?   ?  ?Subjective: ?2 BMs yesterday (liquid), tolerated fulls ?ROS negative except as listed above. ?Objective: ?Vital signs in last 24 hours: ?Temp:  [97.6 ?F (36.4 ?C)-98.2 ?F (36.8 ?C)] 97.6 ?F (36.4 ?C) (05/10 7412) ?Pulse Rate:  [74-87] 81 (05/10 0603) ?Resp:  [16-19] 16 (05/10 0603) ?BP: (118-120)/(71-84) 118/84 (05/10 0603) ?SpO2:  [95 %-97 %] 97 % (05/10 0603) ?Last BM Date : 09/21/21 ? ?Intake/Output from previous day: ?05/09 0701 - 05/10 0700 ?In: 1889 [P.O.:240; I.V.:1649] ?Out: -  ?Intake/Output this shift: ?No intake/output data recorded. ? ?General appearance: alert and cooperative ?Resp: clear to auscultation bilaterally ?GI: softer, NT ?Extremities: calves soft ? ?Lab Results: ?CBC  ?Recent Labs  ?  09/20/21 ?0109  ?WBC 8.8  ?HGB 12.9  ?HCT 40.7  ?PLT 266  ? ?BMET ?Recent Labs  ?  09/20/21 ?0109  ?NA 139  ?K 3.8  ?CL 102  ?CO2 28  ?GLUCOSE 83  ?BUN 5*  ?CREATININE 0.72  ?CALCIUM 8.9  ? ?PT/INR ?No results for input(s): LABPROT, INR in the last 72 hours. ?ABG ?No results for input(s): PHART, HCO3 in the last 72 hours. ? ?Invalid input(s): PCO2, PO2 ? ?Studies/Results: ?No results found. ? ?Anti-infectives: ?Anti-infectives (From admission, onward)  ? ? None  ? ?  ? ? ?Assessment/Plan: ?Ventral hernia repair 4/14 readmitted with early SBO - improving, advance to reg ?HTN - home med ?FEN -  reg diet today, decrease IVF ?VTE - lovenox ?ID - no issues ?Disposition - hopefully home tomorrow ? ? LOS: 6 days  ? ? ?[name], MD, MPH, FACS ?Trauma & General Surgery ?Use [email or web address] to contact on call provider ? ?09/21/2021  ?

## 2021-09-22 NOTE — Progress Notes (Signed)
Per Dr Grandville Silos ok to d/c PIV.  ?

## 2021-09-22 NOTE — Progress Notes (Signed)
Patient ID: Kaitlyn Bray, female   DOB: 04-08-1966, 56 y.o.   MRN: [id number] ?   ?  ?Subjective: ?Tolerated diet, no further BM, no sig pain ?ROS negative except as listed above. ?Objective: ?Vital signs in last 24 hours: ?Temp:  [98.1 ?F (36.7 ?C)-98.4 ?F (36.9 ?C)] 98.3 ?F (36.8 ?C) (05/11 [id number]) ?Pulse Rate:  [70-88] 88 (05/11 0432) ?Resp:  [16-18] 18 (05/11 0432) ?BP: (123-134)/(68-86) 134/77 (05/11 0432) ?SpO2:  [95 %-100 %] 95 % (05/11 0432) ?Last BM Date : 09/21/21 ? ?Intake/Output from previous day: ?05/10 0701 - 05/11 0700 ?In: 2145.6 [P.O.:240; I.V.:1905.6] ?Out: -  ?Intake/Output this shift: ?No intake/output data recorded. ? ?General appearance: alert and cooperative ?Resp: clear to auscultation bilaterally ?GI: very soft, NT ?Extremities: calves soft ? ?Lab Results: ?CBC  ?Recent Labs  ?  09/20/21 ?0109  ?WBC 8.8  ?HGB 12.9  ?HCT 40.7  ?PLT 266  ? ?BMET ?Recent Labs  ?  09/20/21 ?0109  ?NA 139  ?K 3.8  ?CL 102  ?CO2 28  ?GLUCOSE 83  ?BUN 5*  ?CREATININE 0.72  ?CALCIUM 8.9  ? ?PT/INR ?No results for input(s): LABPROT, INR in the last 72 hours. ?ABG ?No results for input(s): PHART, HCO3 in the last 72 hours. ? ?Invalid input(s): PCO2, PO2 ? ?Studies/Results: ?No results found. ? ?Anti-infectives: ?Anti-infectives (From admission, onward)  ? ? None  ? ?  ? ? ?Assessment/Plan: ?Ventral hernia repair 4/14 readmitted with early SBO - improved, tolerating diet ?HTN - home med ?FEN -  reg diet and will see how today goes ?VTE - lovenox ?ID - no issues ?Disposition - check this PM for D/C ? LOS: 7 days  ? ? ?Georganna Skeans, MD, MPH, FACS ?Trauma & General Surgery ?Use [email or web address] to contact on call provider ? ?09/22/2021  ?

## 2021-09-22 NOTE — Progress Notes (Signed)
Patient ID: Kaitlyn Bray, female   DOB: 1965-11-08, 56 y.o.   MRN: [id number] ?Passing gas but no BM. Will keep until the AM. ? ?Georganna Skeans, MD, MPH, FACS ?Please use [email or web address] to contact on call provider ? ?

## 2021-09-22 NOTE — Plan of Care (Signed)
  Problem: Clinical Measurements: Goal: Will remain free from infection Outcome: Progressing   Problem: Nutrition: Goal: Adequate nutrition will be maintained Outcome: Progressing   

## 2021-09-22 NOTE — Progress Notes (Signed)
Mobility Specialist Progress Note: ? ? 09/22/21 1400  ?Mobility  ?Activity Ambulated independently in hallway  ?Level of Assistance Independent  ?Assistive Device None  ?Distance Ambulated (ft) 1120 ft  ?Activity Response Tolerated well  ?$Mobility charge 1 Mobility  ? ?Pt walking laps independently w/o AD.  ? ?Kaitlyn Bray ?Mobility Specialist ?Primary Phone [phone] ? ?

## 2021-09-23 ENCOUNTER — Telehealth: Payer: Self-pay

## 2021-09-23 ENCOUNTER — Other Ambulatory Visit (HOSPITAL_COMMUNITY): Payer: Self-pay

## 2021-09-23 MED ORDER — TRAMADOL HCL 50 MG PO TABS
50.0000 mg | ORAL_TABLET | Freq: Four times a day (QID) | ORAL | 0 refills | Status: AC | PRN
  Filled 2021-09-23: qty 20, 5d supply, fill #0

## 2021-09-23 NOTE — Discharge Summary (Signed)
Physician Discharge Summary  ?Patient ID: ?Kaitlyn Bray ?MRN: [id number] ?DOB/AGE: 01/10/1966 56 y.o. ? ?Admit date: 09/15/2021 ?Discharge date: 09/23/2021 ? ?Admission Diagnoses: ? ?Discharge Diagnoses:  ?Principal Problem: ?  SBO (small bowel obstruction) (HCC) ?Active Problems: ?  Recurrent ventral hernia ? ? ?Discharged Condition: good ? ?Hospital Course: Patient is status post repair of recurrent incisional hernia with mesh.  She was admitted with an early postoperative small bowel obstruction.  She also has a small recurrence of her hernia.  She underwent bowel rest.  Bowel function gradually returned.  She has had multiple bowel movements and is ready for discharge.  She will plan to follow-up in my office.  She will also plan to have complex hernia repair in the future by Dr. Dossie Der.  ? ?Consults: None ? ?Significant Diagnostic Studies: CT ? ?Treatments: IV hydration ? ?Discharge Exam: ?Blood pressure (!) 134/92, pulse 96, temperature 98.2 ?F (36.8 ?C), temperature source Oral, resp. rate 17, height 5\' 5"  (1.651 m), weight 109.6 kg, SpO2 98 %. ?General appearance: alert and cooperative ?Resp: clear to auscultation bilaterally ?GI: Much softer.  I change the dressing on her drain site.  Incision clean dry and intact. ?Extremities: No edema ? ?Disposition: Discharge disposition: 01-Home or Self Care ? ? ? ? ? ? ?Discharge Instructions   ? ? Call MD for:  persistant nausea and vomiting   Complete by: As directed ?  ? Call MD for:  severe uncontrolled pain   Complete by: As directed ?  ? Diet - low sodium heart healthy   Complete by: As directed ?  ? Increase activity slowly   Complete by: As directed ?  ? ?  ? ?Allergies as of 09/23/2021   ? ?   Reactions  ? Codeine Other (See Comments)  ? Fast heart rate  ? Latex Other (See Comments)  ? Redness from bandaids  ? Penicillins Rash  ? Sulfa Drugs Cross Reactors Rash  ? ?  ? ?  ?Medication List  ?  ? ?TAKE these medications   ? ?albuterol 108 (90 Base) MCG/ACT  inhaler ?Commonly known as: VENTOLIN HFA ?Inhale 1-2 puffs into the lungs every 6 (six) hours as needed for wheezing or shortness of breath. ?  ?ibuprofen 200 MG tablet ?Commonly known as: ADVIL ?Take 200 mg by mouth every 4 (four) hours as needed for headache. ?  ?losartan-hydrochlorothiazide 50-12.5 MG tablet ?Commonly known as: HYZAAR ?TAKE 1 TABLET BY MOUTH EVERY DAY ?What changed: when to take this ?  ?methocarbamol 500 MG tablet ?Commonly known as: Robaxin ?Take 1 tablet (500 mg total) by mouth every 8 (eight) hours as needed for muscle spasms. ?  ?traMADol 50 MG tablet ?Commonly known as: Ultram ?Take 1 tablet (50 mg total) by mouth every 6 (six) hours as needed. ?What changed:  ?how much to take ?reasons to take this ?  ? ?  ? ? Follow-up Information   ? ? 11/23/2021, MD Follow up.   ?Specialty: General Surgery ?Why: my office will call Monday to give you an appointment ?Contact information: ?[address] ?[address] ?[place] ?[phone] ? ? ?  ?  ? ?  ?  ? ?  ? ? ?Signed: ?[phone] ?09/23/2021, 9:39 AM ? ? ?

## 2021-09-23 NOTE — Telephone Encounter (Signed)
NA, couldn't leave a VM due to mailbox full ? ?

## 2021-09-23 NOTE — Progress Notes (Signed)
Mobility Specialist Progress Note  ? ? 09/23/21 0852  ?Mobility  ?Activity Ambulated independently in hallway  ?Level of Assistance Independent  ?Assistive Device None  ?Distance Ambulated (ft) 1000 ft  ?Activity Response Tolerated well  ?$Mobility charge 1 Mobility  ? ?Pt received in hallway and agreeable. No complaints on walk. Returned to room with call bell in reach.   ? ?Hildred Alamin ?Mobility Specialist  ?Primary: 5N M.S. Phone: [phone] ?Secondary: 6N M.S. Phone: [phone] ?  ?

## 2021-09-27 NOTE — Progress Notes (Signed)
Severe/morbid obesity ?

## 2021-10-14 ENCOUNTER — Other Ambulatory Visit: Payer: Self-pay | Admitting: Internal Medicine

## 2021-10-14 NOTE — Telephone Encounter (Signed)
  Notes to clinic:  Not a pt at this practice, please assess.       Requested Prescriptions  Pending Prescriptions Disp Refills   losartan-hydrochlorothiazide (HYZAAR) 50-12.5 MG tablet [Pharmacy Med Name: LOSARTAN-HCTZ 50-12.5 MG TAB] 90 tablet 3    Sig: TAKE 1 TABLET BY MOUTH EVERY DAY     Cardiovascular: ARB + Diuretic Combos Failed - 10/14/2021  9:52 AM      Failed - Last BP in normal range    BP Readings from Last 1 Encounters:  09/23/21 (!) 134/92         Failed - Valid encounter within last 6 months    Recent Outpatient Visits   None             Passed - K in normal range and within 180 days    Potassium  Date Value Ref Range Status  09/20/2021 3.8 3.5 - 5.1 mmol/L Final         Passed - Na in normal range and within 180 days    Sodium  Date Value Ref Range Status  09/20/2021 139 135 - 145 mmol/L Final         Passed - Cr in normal range and within 180 days    Creatinine, Ser  Date Value Ref Range Status  09/20/2021 0.72 0.44 - 1.00 mg/dL Final         Passed - eGFR is 10 or above and within 180 days    GFR calc Af Amer  Date Value Ref Range Status  04/05/2018 >60 >60 mL/min Final    Comment:    (NOTE) The eGFR has been calculated using the CKD EPI equation. This calculation has not been validated in all clinical situations. eGFR's persistently <60 mL/min signify possible Chronic Kidney Disease.    GFR, Estimated  Date Value Ref Range Status  09/20/2021 >60 >60 mL/min Final    Comment:    (NOTE) Calculated using the CKD-EPI Creatinine Equation (2021)    GFR  Date Value Ref Range Status  08/26/2020 99.56 >60.00 mL/min Final    Comment:    Calculated using the CKD-EPI Creatinine Equation (2021)         Passed - Patient is not pregnant

## 2021-10-14 NOTE — Telephone Encounter (Signed)
Name of Medication: losartan-HCTZ 50-12.5 mg Name of Pharmacy: [facility] or Written Date and Quantity: #90 x 3 on 08/26/2020 Last Office Visit and Type: 08/26/20 annual Next Office Visit and Type:none scheduled  Pt cancelled TOC on 06/21/2021.  Sending refill request to [facility].

## 2021-11-11 ENCOUNTER — Other Ambulatory Visit: Payer: Self-pay | Admitting: Family

## 2021-11-18 ENCOUNTER — Telehealth: Payer: Self-pay

## 2021-11-18 NOTE — Telephone Encounter (Signed)
MEDICATION: losartan-hydrochlorothiazide (HYZAAR) 50-12.5 MG tablet  PHARMACY: [id number] - [place], [place] - [address]   Comments: Patient is completely out and ha san upcoming appt with tabitha.   **Let patient know to contact pharmacy at the end of the day to make sure medication is ready. **  ** Please notify patient to allow 48-72 hours to process**  **Encourage patient to contact the pharmacy for refills or they can request refills through [facility]**

## 2021-11-19 ENCOUNTER — Other Ambulatory Visit: Payer: Self-pay | Admitting: Internal Medicine

## 2021-11-20 ENCOUNTER — Other Ambulatory Visit: Payer: Self-pay | Admitting: Family

## 2021-11-20 MED ORDER — LOSARTAN POTASSIUM-HCTZ 50-12.5 MG PO TABS: 1.0000 | ORAL_TABLET | Freq: Every day | ORAL | 0 refills | Status: DC

## 2021-11-21 NOTE — Telephone Encounter (Signed)
Last RF Kaitlyn Pancoast FNP on 11/20/21 #30 tablets  Requested Prescriptions  Refused Prescriptions Disp Refills  . losartan-hydrochlorothiazide (HYZAAR) 50-12.5 MG tablet [Pharmacy Med Name: LOSARTAN-HCTZ 50-12.5 MG TAB] 90 tablet 3    Sig: TAKE 1 TABLET BY MOUTH EVERY DAY     Cardiovascular: ARB + Diuretic Combos Failed - 11/19/2021  9:03 AM      Failed - Last BP in normal range    BP Readings from Last 1 Encounters:  09/23/21 (!) 134/92         Failed - Valid encounter within last 6 months    Recent Outpatient Visits   None     Future Appointments            In 2 weeks [place], Tabitha, [place] [facility] [place], [place] in normal range and within 180 days    Potassium  Date Value Ref Range Status  09/20/2021 3.8 3.5 - 5.1 mmol/L Final         Passed - Na in normal range and within 180 days    Sodium  Date Value Ref Range Status  09/20/2021 139 135 - 145 mmol/L Final         Passed - Cr in normal range and within 180 days    Creatinine, Ser  Date Value Ref Range Status  09/20/2021 0.72 0.44 - 1.00 mg/dL Final         Passed - eGFR is 10 or above and within 180 days    GFR calc Af Amer  Date Value Ref Range Status  04/05/2018 >60 >60 mL/min Final    Comment:    (NOTE) The eGFR has been calculated using the CKD EPI equation. This calculation has not been validated in all clinical situations. eGFR's persistently <60 mL/min signify possible Chronic Kidney Disease.    GFR, Estimated  Date Value Ref Range Status  09/20/2021 >60 >60 mL/min Final    Comment:    (NOTE) Calculated using the CKD-EPI Creatinine Equation (2021)    GFR  Date Value Ref Range Status  08/26/2020 99.56 >60.00 mL/min Final    Comment:    Calculated using the CKD-EPI Creatinine Equation (2021)         Passed - Patient is not pregnant

## 2021-12-07 ENCOUNTER — Encounter: Payer: Commercial Managed Care - HMO | Admitting: Family

## 2021-12-12 ENCOUNTER — Other Ambulatory Visit: Payer: Self-pay | Admitting: Family

## 2022-01-12 ENCOUNTER — Other Ambulatory Visit: Payer: Self-pay | Admitting: Family

## 2022-01-17 ENCOUNTER — Encounter: Payer: Commercial Managed Care - HMO | Admitting: Family

## 2022-01-17 ENCOUNTER — Encounter: Payer: Self-pay | Admitting: Family

## 2022-01-17 ENCOUNTER — Ambulatory Visit (INDEPENDENT_AMBULATORY_CARE_PROVIDER_SITE_OTHER): Payer: Self-pay | Admitting: Family

## 2022-01-17 VITALS — BP 128/74 | HR 78 | Temp 98.6°F | Resp 16 | Ht 65.0 in | Wt 246.1 lb

## 2022-01-17 DIAGNOSIS — M2550 Pain in unspecified joint: Secondary | ICD-10-CM

## 2022-01-17 DIAGNOSIS — I10 Essential (primary) hypertension: Secondary | ICD-10-CM

## 2022-01-17 DIAGNOSIS — J4520 Mild intermittent asthma, uncomplicated: Secondary | ICD-10-CM

## 2022-01-17 DIAGNOSIS — E559 Vitamin D deficiency, unspecified: Secondary | ICD-10-CM

## 2022-01-17 DIAGNOSIS — M62838 Other muscle spasm: Secondary | ICD-10-CM

## 2022-01-17 DIAGNOSIS — K432 Incisional hernia without obstruction or gangrene: Secondary | ICD-10-CM

## 2022-01-17 DIAGNOSIS — K5900 Constipation, unspecified: Secondary | ICD-10-CM

## 2022-01-17 DIAGNOSIS — H8113 Benign paroxysmal vertigo, bilateral: Secondary | ICD-10-CM

## 2022-01-17 DIAGNOSIS — R5383 Other fatigue: Secondary | ICD-10-CM

## 2022-01-17 DIAGNOSIS — R7303 Prediabetes: Secondary | ICD-10-CM

## 2022-01-17 DIAGNOSIS — I700 Atherosclerosis of aorta: Secondary | ICD-10-CM

## 2022-01-17 LAB — COMPREHENSIVE METABOLIC PANEL
ALT: 17 U/L (ref 0–35)
AST: 14 U/L (ref 0–37)
Albumin: 4.1 g/dL (ref 3.5–5.2)
Alkaline Phosphatase: 80 U/L (ref 39–117)
BUN: 11 mg/dL (ref 6–23)
CO2: 31 mEq/L (ref 19–32)
Calcium: 9.6 mg/dL (ref 8.4–10.5)
Chloride: 102 mEq/L (ref 96–112)
Creatinine, Ser: 0.64 mg/dL (ref 0.40–1.20)
GFR: 98.96 mL/min (ref >60.00)
Glucose, Bld: 84 mg/dL (ref 70–99)
Potassium: 4.2 mEq/L (ref 3.5–5.1)
Sodium: 143 mEq/L (ref 135–145)
Total Bilirubin: 0.3 mg/dL (ref 0.2–1.2)
Total Protein: 7.1 g/dL (ref 6.0–8.3)

## 2022-01-17 LAB — CBC
HCT: 39.5 % (ref 36.0–46.0)
Hemoglobin: 13 g/dL (ref 12.0–15.0)
MCHC: 32.8 g/dL (ref 30.0–36.0)
MCV: 83.1 fl (ref 78.0–100.0)
Platelets: 242 10*3/uL (ref 150.0–400.0)
RBC: 4.76 Mil/uL (ref 3.87–5.11)
RDW: 14.4 % (ref 11.5–15.5)
WBC: 10.2 10*3/uL (ref 4.0–10.5)

## 2022-01-17 LAB — VITAMIN B12: Vitamin B-12: 154 pg/mL — ABNORMAL LOW (ref 211–911)

## 2022-01-17 LAB — HEMOGLOBIN A1C: Hgb A1c MFr Bld: 7.2 % — ABNORMAL HIGH (ref 4.6–6.5)

## 2022-01-17 MED ORDER — ALBUTEROL SULFATE HFA 108 (90 BASE) MCG/ACT IN AERS: 1.0000 | INHALATION_SPRAY | Freq: Four times a day (QID) | RESPIRATORY_TRACT | 1 refills | Status: DC | PRN

## 2022-01-17 MED ORDER — METHOCARBAMOL 500 MG PO TABS: 500.0000 mg | ORAL_TABLET | Freq: Three times a day (TID) | ORAL | 0 refills | Status: DC | PRN

## 2022-01-17 MED ORDER — MECLIZINE HCL 25 MG PO TABS: ORAL_TABLET | ORAL | 0 refills | Status: DC

## 2022-01-17 NOTE — Progress Notes (Signed)
Established Patient Office Visit  Subjective:  Patient ID: Kaitlyn Bray, female    DOB: 1965/08/03  Age: 56 y.o. MRN: [id number]  CC:  Chief Complaint  Patient presents with   Transitions Of Care    HPI MAEGAN Bray is here for a transition of care visit.  Currently in lapse with insurance.   Prior provider was: Nicki Reaper, FNP  Pt is without acute concerns.   chronic concerns:  Asthma: triggered by changes in weather, heat worsens asthma.   Constipation: tries to watch her diet, and this has been helpful with constipation.   Incision hernia: initial surgery was in 2021 due from total blockage. Repeat surgery was in 2022. Now with hernia again from mesh, mesh tore. Was wearing abdominal binder, but needs to purchase another one.   Recent visit with surgeon about two weeks ago.  Was told to watch diet, discussed likely need for surgery  Needs to lose weight which will decrease risks involved with surgery.   F/u with them 05/2022.   Ct abd pelvis 09/15/21, sbo, pt states worked itself out in hospital she stayed in hospital for one week. Was on bland diet and given NG tube decompression. Has been moving bowels fine since that occurrence.  Overall size of hernia decreased when compared with prior exam however persistent large hernia sac with reoccurrence.   Vertigo: still has vertigo from time to time. No current vertigo symptoms. Rises slowly upon standing which has helped significantly.   Obesity: has been referred to healthy weight and wellness, she states has been called but has not been able to yet return call.   Stiffness in am bil feet and bil hands in am, dad with h/o rheumatoid arthritis.   Past Medical History:  Diagnosis Date   Asthma    Complication of anesthesia    Heart murmur    Hernia    Hypertension    PONV (postoperative nausea and vomiting)    Pre-diabetes    S/P hernia repair 08/26/2021   SBO (small bowel obstruction) (HCC) 09/15/2021    Past  Surgical History:  Procedure Laterality Date   ABDOMINAL HYSTERECTOMY     one ovary stlil in place. removed uterus cervix and pt thinks fallopian tubes   APPENDECTOMY     CESAREAN SECTION     INCISIONAL HERNIA REPAIR N/A 08/26/2021   Procedure: INCISIONAL HERNIA REPAIR;  Surgeon: [name], MD;  Location: [facility] OR;  Service: General;  Laterality: N/A;   INSERTION OF MESH N/A 08/26/2021   Procedure: INSERTION OF MESH;  Surgeon: [name], MD;  Location: [facility] OR;  Service: General;  Laterality: N/A;   LAPAROTOMY N/A 07/29/2020   Procedure: EXPLORATORY LAPAROTOMY, REPAIR OF VENTRAL HERNIA;  Surgeon: [name], MD;  Location: [facility] OR;  Service: General;  Laterality: N/A;   MYOMECTOMY      Family History  Problem Relation Age of Onset   Hypertension Mother    Stroke Mother    Dementia Mother    Heart disease Mother    Hyperlipidemia Mother    COPD Father    Rheum arthritis Father    Mental illness Father    Heart disease Maternal Grandmother    Heart disease Maternal Grandfather    Alzheimer's disease Maternal Grandfather    Uterine cancer Paternal Grandmother     Social History   Socioeconomic History   Marital status: Married    Spouse name: Not on file   Number of children: 1  Years of education: Not on file   Highest education level: Not on file  Occupational History   Occupation: daycare    Comment: [employer]  Tobacco Use   Smoking status: Never   Smokeless tobacco: Never  Vaping Use   Vaping Use: Never used  Substance and Sexual Activity   Alcohol use: No   Drug use: No   Sexual activity: Not Currently    Partners: Male    Birth control/protection: Surgical  Other Topics Concern   Not on file  Social History Narrative   One 56 y/o boy    Lives with husband and son    Cats outside   Social Determinants of Health   Financial Resource Strain: Not on file  Food Insecurity: Not on file  Transportation Needs: Not on file  Physical Activity:  Not on file  Stress: Not on file  Social Connections: Not on file  Intimate Partner Violence: Not on file    Outpatient Medications Prior to Visit  Medication Sig Dispense Refill   ibuprofen (ADVIL) 200 MG tablet Take 200 mg by mouth every 4 (four) hours as needed for headache.     losartan-hydrochlorothiazide (HYZAAR) 50-12.5 MG tablet TAKE 1 TABLET BY MOUTH EVERY DAY 30 tablet 0   traMADol (ULTRAM) 50 MG tablet Take 1 tablet (50 mg total) by mouth every 6 (six) hours as needed. 20 tablet 0   albuterol (VENTOLIN HFA) 108 (90 Base) MCG/ACT inhaler Inhale 1-2 puffs into the lungs every 6 (six) hours as needed for wheezing or shortness of breath. 18 g 1   methocarbamol (ROBAXIN) 500 MG tablet Take 1 tablet (500 mg total) by mouth every 8 (eight) hours as needed for muscle spasms. 20 tablet 0   No facility-administered medications prior to visit.    Allergies  Allergen Reactions   Codeine Other (See Comments)    Fast heart rate   Latex Other (See Comments)    Redness from bandaids   Penicillins Rash   Sulfa Drugs Cross Reactors Rash        Objective:    Physical Exam Vitals reviewed.  Constitutional:      General: She is not in acute distress.    Appearance: Normal appearance. She is not ill-appearing or toxic-appearing.  HENT:     Right Ear: Tympanic membrane normal.     Left Ear: Tympanic membrane normal.     Mouth/Throat:     Mouth: Mucous membranes are moist.     Pharynx: No pharyngeal swelling.     Tonsils: No tonsillar exudate.  Eyes:     Extraocular Movements: Extraocular movements intact.     Conjunctiva/sclera: Conjunctivae normal.     Pupils: Pupils are equal, round, and reactive to light.  Neck:     Thyroid: No thyroid mass.  Cardiovascular:     Rate and Rhythm: Normal rate and regular rhythm.     Heart sounds: Murmur heard.  Pulmonary:     Effort: Pulmonary effort is normal.     Breath sounds: Normal breath sounds.  Musculoskeletal:        General:  Normal range of motion.  Lymphadenopathy:     Cervical:     Right cervical: No superficial cervical adenopathy.    Left cervical: No superficial cervical adenopathy.  Skin:    General: Skin is warm.     Capillary Refill: Capillary refill takes less than 2 seconds.  Neurological:     General: No focal deficit present.  Mental Status: She is alert and oriented to person, place, and time.  Psychiatric:        Mood and Affect: Mood normal.        Behavior: Behavior normal.        Thought Content: Thought content normal.        Judgment: Judgment normal.      BP 128/74   Pulse 78   Temp 98.6 F (37 C)   Resp 16   Ht 5\' 5"  (1.651 m)   Wt 246 lb 2 oz (111.6 kg)   SpO2 96%   BMI 40.96 kg/m  Wt Readings from Last 3 Encounters:  01/17/22 246 lb 2 oz (111.6 kg)  09/15/21 241 lb 10 oz (109.6 kg)  08/26/21 260 lb (117.9 kg)     Health Maintenance Due  Topic Date Due   COLONOSCOPY (Pts 45-74yrs Insurance coverage will need to be confirmed)  Never done   MAMMOGRAM  Never done   Zoster Vaccines- Shingrix (1 of 2) Never done   COVID-19 Vaccine (3 - Pfizer series) 11/27/2019   INFLUENZA VACCINE  Never done    There are no preventive care reminders to display for this patient.  Lab Results  Component Value Date   TSH 1.75 08/26/2020   Lab Results  Component Value Date   WBC 8.8 09/20/2021   HGB 12.9 09/20/2021   HCT 40.7 09/20/2021   MCV 87.9 09/20/2021   PLT 266 09/20/2021   Lab Results  Component Value Date   NA 139 09/20/2021   K 3.8 09/20/2021   CO2 28 09/20/2021   GLUCOSE 83 09/20/2021   BUN 5 (L) 09/20/2021   CREATININE 0.72 09/20/2021   BILITOT 0.3 09/15/2021   ALKPHOS 68 09/15/2021   AST 24 09/15/2021   ALT 37 09/15/2021   PROT 5.9 (L) 09/15/2021   ALBUMIN 3.0 (L) 09/15/2021   CALCIUM 8.9 09/20/2021   ANIONGAP 9 09/20/2021   GFR 99.56 08/26/2020   Lab Results  Component Value Date   CHOL 164 08/26/2020   Lab Results  Component Value Date    HDL 43.10 08/26/2020   Lab Results  Component Value Date   LDLCALC 95 08/26/2020   Lab Results  Component Value Date   TRIG 153 (H) 09/03/2021   Lab Results  Component Value Date   CHOLHDL 4 08/26/2020   Lab Results  Component Value Date   HGBA1C 6.3 (H) 02/24/2021      Assessment & Plan:   Problem List Items Addressed This Visit       Cardiovascular and Mediastinum   HTN (hypertension)    continue losartan hctz 50-12.5 mg  Pt advised of the following:  Continue medication as prescribed. Monitor blood pressure periodically and/or when you feel symptomatic. Goal is <130/90 on average. Ensure that you have rested for 30 minutes prior to checking your blood pressure. Record your readings and bring them to your next visit if necessary.work on a low sodium diet.       Aortic atherosclerosis (HCC)    Continue with low cholesterol diet exercise as tolerated        Respiratory   Asthma - Primary    Albuterol hfa prn       Relevant Medications   albuterol (VENTOLIN HFA) 108 (90 Base) MCG/ACT inhaler     Nervous and Auditory   BPPV (benign paroxysmal positional vertigo)    rx meclizine for prn 12.5       Relevant Medications  meclizine (ANTIVERT) 25 MG tablet     Other   Constipation    Work on fiber in diet, water intake goal 64 oz       Prediabetes    Pt advised of the following: Work on a diabetic diet, try to incorporate exercise at least 20-30 a day for 3 days a week or more. a1c ordered       Relevant Orders   Hemoglobin A1c   Recurrent ventral hernia    F/u with surgeon as scheduled monitor for red flag symptoms d/w with pt      Morbid obesity (HCC)    Work on diet and exercise as tolerated      Other fatigue    b12 cbc and cmp  Pending results      Relevant Orders   Vitamin B12   CBC   Hypocalcemia    Repeat cmp       Relevant Orders   Comprehensive metabolic panel   Polyarthralgia    Will consider ana rf at next visit  As dad  with rheumatoid arthritis      Muscle spasm    Robaxin prn       Relevant Medications   methocarbamol (ROBAXIN) 500 MG tablet   Vitamin D deficiency    Start vitamin d3 2000 IU once daily       Meds ordered this encounter  Medications   albuterol (VENTOLIN HFA) 108 (90 Base) MCG/ACT inhaler    Sig: Inhale 1-2 puffs into the lungs every 6 (six) hours as needed for wheezing or shortness of breath.    Dispense:  18 g    Refill:  1    Order Specific Question:   Supervising Provider    Answer:   BEDSOLE, AMY E [[id number]]   meclizine (ANTIVERT) 25 MG tablet    Sig: Take 1/2 to 1 tablet po qd prn vertigo    Dispense:  20 tablet    Refill:  0    Order Specific Question:   Supervising Provider    Answer:   BEDSOLE, AMY E [[id number]]   methocarbamol (ROBAXIN) 500 MG tablet    Sig: Take 1 tablet (500 mg total) by mouth every 8 (eight) hours as needed for muscle spasms.    Dispense:  20 tablet    Refill:  0    Order Specific Question:   Supervising Provider    Answer:   Ermalene Searing, AMY E [[id number]]    Follow-up: Return in about 6 months (around 07/18/2022) for regular follow up appt in office .    Mort Sawyers, FNP

## 2022-01-17 NOTE — Assessment & Plan Note (Signed)
Start vitamin d3 2000 IU once daily 

## 2022-01-17 NOTE — Assessment & Plan Note (Signed)
rx meclizine for prn 12.5

## 2022-01-17 NOTE — Assessment & Plan Note (Signed)
Repeat cmp 

## 2022-01-17 NOTE — Assessment & Plan Note (Signed)
b12 cbc and cmp  Pending results

## 2022-01-17 NOTE — Assessment & Plan Note (Signed)
continue losartan hctz 50-12.5 mg  Pt advised of the following:  Continue medication as prescribed. Monitor blood pressure periodically and/or when you feel symptomatic. Goal is <130/90 on average. Ensure that you have rested for 30 minutes prior to checking your blood pressure. Record your readings and bring them to your next visit if necessary.work on a low sodium diet.

## 2022-01-17 NOTE — Assessment & Plan Note (Signed)
Continue with low cholesterol diet exercise as tolerated

## 2022-01-17 NOTE — Patient Instructions (Addendum)
  Recommend vitamin D3 2000 IU once daily.   Recommend starting flonase (fluticasone) once daily. These are for allergies.  Also recommend nightly zyrtec.   Welcome to our clinic, I am happy to have you as my new patient. I am excited to continue on this healthcare journey with you.  Stop by the lab prior to leaving today. I will notify you of your results once received.   Please keep in mind Any my chart messages you send have up to a three business day turnaround for a response.  Phone calls may take up to a one full business day turnaround for a  response.   If you need a medication refill I recommend you request it through the pharmacy as this is easiest for [place] rather than sending a message and or phone call.   Due to recent changes in healthcare laws, you may see results of your imaging and/or laboratory studies on MyChart before I have had a chance to review them.  I understand that in some cases there may be results that are confusing or concerning to you. Please understand that not all results are received at the same time and often I may need to interpret multiple results in order to provide you with the best plan of care or course of treatment. Therefore, I ask that you please give me 2 business days to thoroughly review all your results before contacting my office for clarification. Should we see a critical lab result, you will be contacted sooner.   It was a pleasure seeing you today! Please do not hesitate to reach out with any questions and or concerns.  Regards,   Mort Sawyers FNP-C

## 2022-01-17 NOTE — Assessment & Plan Note (Signed)
Pt advised of the following: Work on a diabetic diet, try to incorporate exercise at least 20-30 a day for 3 days a week or more. a1c ordered

## 2022-01-17 NOTE — Assessment & Plan Note (Signed)
Work on diet and exercise as tolerated  ?

## 2022-01-17 NOTE — Assessment & Plan Note (Signed)
Will consider ana rf at next visit  As dad with rheumatoid arthritis

## 2022-01-17 NOTE — Assessment & Plan Note (Signed)
Robaxin prn 

## 2022-01-17 NOTE — Assessment & Plan Note (Signed)
F/u with surgeon as scheduled monitor for red flag symptoms d/w with pt

## 2022-01-17 NOTE — Assessment & Plan Note (Signed)
Albuterol hfa prn

## 2022-01-17 NOTE — Assessment & Plan Note (Signed)
Work on [employer] in diet, water intake goal 64 oz

## 2022-01-18 NOTE — Progress Notes (Signed)
Vitamin B12 very low, please set up for B12 injections, 1000 mcg IM once monthly for three months, also schedule 3 month f/u appt to repeat labs and f/u in office. Recommend also oral otc 1000 mcg once daily vitamin B12  Pt diabetic   Has she ever taken anything for diabetes? I'd like ot start metformin if she is willing I will send it to pharmacy.  Work on diabetic diet and exercise as tolerated.   F/u three months in office visit, repeat labs at visit.

## 2022-01-20 ENCOUNTER — Telehealth: Payer: Self-pay | Admitting: Family

## 2022-01-20 NOTE — Telephone Encounter (Signed)
Called pt and vm is full unable to leave a message.

## 2022-01-20 NOTE — Telephone Encounter (Signed)
Please let patient know that we attempted to call her 2 times prior about the lab work and voicemails were left.  Please let patient know that I do not order prophylactic medication for sicknesses until they occur.  If she were to develop a cough or something similar I would want her to come into the office to be evaluated  Did she see my MyChart message that I sent to her in regards to her lab work?  If not please read her the following below  Vitamin B12 very low, please set up for B12 injections, 1000 mcg IM once monthly for three months, also schedule 3 month f/u appt to repeat labs and f/u in office. Recommend also oral otc 1000 mcg once daily vitamin B12   Pt diabetic    Has she ever taken anything for diabetes? I'd like ot start metformin if she is willing I will send it to pharmacy.  Work on diabetic diet and exercise as tolerated.    F/u three months in office visit, repeat labs at visit.

## 2022-01-20 NOTE — Telephone Encounter (Signed)
Pt noticed labs in MyChart and thought she missed a call.  Wanting to know next steps.   Also requesting an RX for Tessalon pearls for when she gets a cough in the fall/winter

## 2022-01-20 NOTE — Telephone Encounter (Signed)
Pt returned a missed call regarding her lab work. Requested call back @ [phone].

## 2022-01-23 NOTE — Telephone Encounter (Signed)
Pt returning call regarding lab results #[phone]

## 2022-01-25 NOTE — Telephone Encounter (Signed)
Called pt vm is full and unable to leave a message to return call to the office.

## 2022-01-25 NOTE — Telephone Encounter (Signed)
Patient called to get a call about lab results. Call back number [phone].

## 2022-02-07 ENCOUNTER — Other Ambulatory Visit: Payer: Self-pay | Admitting: Family Medicine

## 2022-02-07 ENCOUNTER — Telehealth: Payer: Self-pay | Admitting: Family

## 2022-02-07 DIAGNOSIS — Z91199 Patient's noncompliance with other medical treatment and regimen due to unspecified reason: Secondary | ICD-10-CM

## 2022-02-07 NOTE — Telephone Encounter (Signed)
We did not discuss cough medications at visit because she was not coughing?   This was three weeks ago if pt isn't feeling well would have to be evaluated as might have sickness

## 2022-02-07 NOTE — Telephone Encounter (Signed)
Pt stated she spoke to [facility] during her ov on 01/17/22 about some cough meds but the prescription was never called in. Pt is now experiencing a bad cough & wants to know could she still receive meds? Call back # [phone].

## 2022-02-08 ENCOUNTER — Telehealth: Payer: Self-pay | Admitting: Family Medicine

## 2022-02-08 DIAGNOSIS — Z91199 Patient's noncompliance with other medical treatment and regimen due to unspecified reason: Secondary | ICD-10-CM | POA: Insufficient documentation

## 2022-02-08 NOTE — Telephone Encounter (Signed)
Called patient was not a good connection call was breaking up. Will call back at later time.

## 2022-02-08 NOTE — Telephone Encounter (Signed)
noted 

## 2022-02-08 NOTE — Telephone Encounter (Signed)
Called patient states she did not want to make appointment to get evaluation. States that she will go to urgent care due to cost with no insurance.   Patient also asked that I review labs with her states she never received call. Advised that our office had called multiple times but were not able to reach. I have reviewed labs. She declines setting up at this time will call back once she is ready. She declined starting the metformin would like to work on diet program she is starting. She did not want to make 3 mo f/u or B-12 as recommended.

## 2022-02-08 NOTE — Telephone Encounter (Signed)
Error

## 2022-02-09 ENCOUNTER — Encounter: Payer: Self-pay | Admitting: Emergency Medicine

## 2022-02-09 ENCOUNTER — Ambulatory Visit
Admission: EM | Admit: 2022-02-09 | Discharge: 2022-02-09 | Disposition: A | Discharge status: Home / Self Care | Payer: Self-pay | Attending: Family Medicine | Admitting: Family Medicine

## 2022-02-09 ENCOUNTER — Other Ambulatory Visit: Payer: Self-pay

## 2022-02-09 DIAGNOSIS — Z20822 Contact with and (suspected) exposure to covid-19: Secondary | ICD-10-CM | POA: Insufficient documentation

## 2022-02-09 DIAGNOSIS — J069 Acute upper respiratory infection, unspecified: Secondary | ICD-10-CM

## 2022-02-09 DIAGNOSIS — R051 Acute cough: Secondary | ICD-10-CM

## 2022-02-09 DIAGNOSIS — J4521 Mild intermittent asthma with (acute) exacerbation: Secondary | ICD-10-CM

## 2022-02-09 LAB — RESP PANEL BY RT-PCR (RSV, FLU A&B, COVID)  RVPGX2
Influenza A by PCR: NEGATIVE
Influenza B by PCR: NEGATIVE
Resp Syncytial Virus by PCR: NEGATIVE
SARS Coronavirus 2 by RT PCR: NEGATIVE

## 2022-02-09 MED ORDER — PREDNISONE 20 MG PO TABS: 40.0000 mg | ORAL_TABLET | Freq: Every day | ORAL | 0 refills | Status: DC

## 2022-02-09 MED ORDER — PROMETHAZINE-DM 6.25-15 MG/5ML PO SYRP: 5.0000 mL | ORAL_SOLUTION | Freq: Four times a day (QID) | ORAL | 0 refills | Status: DC | PRN

## 2022-02-09 MED ORDER — BENZONATATE 100 MG PO CAPS: 100.0000 mg | ORAL_CAPSULE | Freq: Three times a day (TID) | ORAL | 0 refills | Status: DC

## 2022-02-09 NOTE — ED Provider Notes (Signed)
[facility]    CSN: [id number] Arrival date & time: 02/09/22  0932      History   Chief Complaint Chief Complaint  Patient presents with   Cough    HPI Kaitlyn Bray is a 56 y.o. female.   Patient presenting today with 3-day history of progressively worsening productive cough, chest tightness, wheezing, fatigue, fever, chills, body aches, ear pain, scratchy throat.  Denies chest pain, shortness of breath, abdominal pain, nausea vomiting or diarrhea.  Has been taking her inhaler and cold and cough medication with minimal relief.  Spouse now sick with similar symptoms.  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  Requesting to be tested for RSV additionally.  Home COVID test x2 have been negative.  History of asthma on albuterol as needed.    Past Medical History:  Diagnosis Date   Asthma    Complication of anesthesia    Heart murmur    Hernia    Hypertension    PONV (postoperative nausea and vomiting)    Pre-diabetes    S/P hernia repair 08/26/2021   SBO (small bowel obstruction) (HCC) 09/15/2021    Patient Active Problem List   Diagnosis Date Noted   Patient non-compliant, refused service 02/08/2022   Aortic atherosclerosis (HCC) 01/17/2022   Morbid obesity (HCC) 01/17/2022   Other fatigue 01/17/2022   Hypocalcemia 01/17/2022   Polyarthralgia 01/17/2022   Muscle spasm 01/17/2022   Vitamin D deficiency 01/17/2022   Recurrent ventral hernia 09/15/2021   Prediabetes 08/26/2020   Urinary incontinence 06/23/2019   Constipation 06/23/2019   BPPV (benign paroxysmal positional vertigo) 03/20/2019   HTN (hypertension) 04/02/2017   Asthma 04/02/2017    Past Surgical History:  Procedure Laterality Date   ABDOMINAL HYSTERECTOMY     one ovary stlil in place. removed uterus cervix and pt thinks fallopian tubes   APPENDECTOMY     CESAREAN SECTION     INCISIONAL HERNIA REPAIR N/A 08/26/2021   Procedure:  INCISIONAL HERNIA REPAIR;  Surgeon: [name], MD;  Location: [facility] OR;  Service: General;  Laterality: N/A;   INSERTION OF MESH N/A 08/26/2021   Procedure: INSERTION OF MESH;  Surgeon: [name], MD;  Location: [facility] OR;  Service: General;  Laterality: N/A;   LAPAROTOMY N/A 07/29/2020   Procedure: EXPLORATORY LAPAROTOMY, REPAIR OF VENTRAL HERNIA;  Surgeon: [name], MD;  Location: [facility] OR;  Service: General;  Laterality: N/A;   MYOMECTOMY      OB History   No obstetric history on file.      Home Medications    Prior to Admission medications   Medication Sig Start Date End Date Taking? Authorizing Provider  benzonatate (TESSALON) 100 MG capsule Take 1 capsule (100 mg total) by mouth every 8 (eight) hours. 02/09/22  Yes Particia Nearing, PA-C  predniSONE (DELTASONE) 20 MG tablet Take 2 tablets (40 mg total) by mouth daily with breakfast. 02/09/22  Yes Particia Nearing, PA-C  promethazine-dextromethorphan (PROMETHAZINE-DM) 6.25-15 MG/5ML syrup Take 5 mLs by mouth 4 (four) times daily as needed. 02/09/22  Yes Particia Nearing, PA-C  albuterol (VENTOLIN HFA) 108 (90 Base) MCG/ACT inhaler Inhale 1-2 puffs into the lungs every 6 (six) hours as needed for wheezing or shortness of breath. 01/17/22   Mort Sawyers, FNP  ibuprofen (ADVIL) 200 MG tablet Take 200 mg by mouth every 4 (four) hours as needed for headache.    [provider]  losartan-hydrochlorothiazide (  HYZAAR) 50-12.5 MG tablet TAKE 1 TABLET BY MOUTH EVERY DAY 01/13/22   Eugenia Pancoast, FNP  meclizine (ANTIVERT) 25 MG tablet Take 1/2 to 1 tablet po qd prn vertigo 01/17/22   Eugenia Pancoast, FNP  methocarbamol (ROBAXIN) 500 MG tablet Take 1 tablet (500 mg total) by mouth every 8 (eight) hours as needed for muscle spasms. 01/17/22   Eugenia Pancoast, FNP  traMADol (ULTRAM) 50 MG tablet Take 1 tablet (50 mg total) by mouth every 6 (six) hours as needed. 09/23/21 09/23/22  Georganna Skeans, MD    Family  History Family History  Problem Relation Age of Onset   Hypertension Mother    Stroke Mother    Dementia Mother    Heart disease Mother    Hyperlipidemia Mother    COPD Father    Rheum arthritis Father    Mental illness Father    Heart disease Maternal Grandmother    Heart disease Maternal Grandfather    Alzheimer's disease Maternal Grandfather    Uterine cancer Paternal Grandmother     Social History Social History   Tobacco Use   Smoking status: Never   Smokeless tobacco: Never  Vaping Use   Vaping Use: Never used  Substance Use Topics   Alcohol use: No   Drug use: No     Allergies   Codeine, Latex, Penicillins, and Sulfa drugs cross reactors   Review of Systems Review of Systems Per HPI  Physical Exam Triage Vital Signs ED Triage Vitals [02/09/22 1046]  Enc Vitals Group     BP (!) 157/76     Pulse Rate 92     Resp 20     Temp 98.5 F (36.9 C)     Temp Source Oral     SpO2 94 %     Weight      Height      Head Circumference      Peak Flow      Pain Score 5     Pain Loc      Pain Edu?      Excl. in [place]?    No data found.  Updated Vital Signs BP (!) 157/76 (BP Location: Right Arm)   Pulse 92   Temp 98.5 F (36.9 C) (Oral)   Resp 20   SpO2 94%   Visual Acuity Right Eye Distance:   Left Eye Distance:   Bilateral Distance:    Right Eye Near:   Left Eye Near:    Bilateral Near:     Physical Exam Vitals and nursing note reviewed.  Constitutional:      Appearance: Normal appearance.  HENT:     Head: Atraumatic.     Right Ear: Tympanic membrane and external ear normal.     Left Ear: Tympanic membrane and external ear normal.     Nose: Rhinorrhea present.     Mouth/Throat:     Mouth: Mucous membranes are moist.     Pharynx: Posterior oropharyngeal erythema present.  Eyes:     Extraocular Movements: Extraocular movements intact.     Conjunctiva/sclera: Conjunctivae normal.  Cardiovascular:     Rate and Rhythm: Normal rate and regular  rhythm.     Heart sounds: Normal heart sounds.  Pulmonary:     Effort: Pulmonary effort is normal.     Breath sounds: Wheezing present. No rales.     Comments: Mild to moderate wheezes diffusely Musculoskeletal:        General: Normal range of motion.  Cervical back: Normal range of motion and neck supple.  Skin:    General: Skin is warm and dry.  Neurological:     Mental Status: She is alert and oriented to person, place, and time.  Psychiatric:        Mood and Affect: Mood normal.        Thought Content: Thought content normal.      UC Treatments / Results  Labs (all labs ordered are listed, but only abnormal results are displayed) Labs Reviewed  RESP PANEL BY RT-PCR (RSV, FLU A&B, COVID)  RVPGX2    EKG   Radiology No results found.  Procedures Procedures (including critical care time)  Medications Ordered in UC Medications - No data to display  Initial Impression / Assessment and Plan / UC Course  I have reviewed the triage vital signs and the nursing notes.  Pertinent labs & imaging results that were available during my care of the patient were reviewed by me and considered in my medical decision making (see chart for details).     Mildly hypertensive in triage, otherwise vital signs reassuring.  Suspect viral upper respiratory infection causing asthma exacerbation, possibly COVID-19.  She is interested in molnupiravir therapy if COVID-positive.  Respiratory panel pending, treat for now with Phenergan DM, Tessalon Perles, prednisone, albuterol inhaler, cold and congestion medications.  Return for any worsening symptoms.  Work note given.  Final Clinical Impressions(s) / UC Diagnoses   Final diagnoses:  Acute cough  Viral URI with cough  Mild intermittent asthma with acute exacerbation   Discharge Instructions   None    ED Prescriptions     Medication Sig Dispense Auth. Provider   predniSONE (DELTASONE) 20 MG tablet Take 2 tablets (40 mg total) by  mouth daily with breakfast. 10 tablet Particia Nearing, PA-C   promethazine-dextromethorphan (PROMETHAZINE-DM) 6.25-15 MG/5ML syrup Take 5 mLs by mouth 4 (four) times daily as needed. 100 mL Particia Nearing, PA-C   benzonatate (TESSALON) 100 MG capsule Take 1 capsule (100 mg total) by mouth every 8 (eight) hours. 21 capsule Particia Nearing, [place]      PDMP not reviewed this encounter.   Particia Nearing, [place] 02/09/22 1121

## 2022-02-09 NOTE — ED Triage Notes (Addendum)
Pt reports productive cough, fatigue,right ear pain since Monday. Pt reports spouse also started having symptoms last night. Home covid test negative x2. Pt reports intermittent wheezing with exertion.

## 2022-02-13 ENCOUNTER — Other Ambulatory Visit: Payer: Self-pay | Admitting: Family

## 2022-03-14 ENCOUNTER — Ambulatory Visit
Admission: RE | Admit: 2022-03-14 | Discharge: 2022-03-14 | Disposition: A | Discharge status: Home / Self Care | Payer: Self-pay | Source: Ambulatory Visit | Attending: Nurse Practitioner | Admitting: Nurse Practitioner

## 2022-03-14 VITALS — BP 134/82 | HR 89 | Temp 97.9°F | Resp 20

## 2022-03-14 DIAGNOSIS — J069 Acute upper respiratory infection, unspecified: Secondary | ICD-10-CM | POA: Insufficient documentation

## 2022-03-14 DIAGNOSIS — Z1152 Encounter for screening for COVID-19: Secondary | ICD-10-CM | POA: Insufficient documentation

## 2022-03-14 LAB — RESP PANEL BY RT-PCR (FLU A&B, COVID) ARPGX2
Influenza A by PCR: NEGATIVE
Influenza B by PCR: NEGATIVE
SARS Coronavirus 2 by RT PCR: NEGATIVE

## 2022-03-14 MED ORDER — BENZONATATE 100 MG PO CAPS: 100.0000 mg | ORAL_CAPSULE | Freq: Three times a day (TID) | ORAL | 0 refills | Status: DC

## 2022-03-14 NOTE — ED Triage Notes (Signed)
Pt reports since Sunday morning she has some coughing, yellow-greenish flem, fatigue feeling. Took an advil for sinus headache and gave slight relief.

## 2022-03-14 NOTE — ED Provider Notes (Signed)
[facility]    CSN: [id number] Arrival date & time: 03/14/22  1339      History   Chief Complaint No chief complaint on file.   HPI Kaitlyn Bray is a 56 y.o. female.   Patient presents for 2 days of congestion/productive cough, chest tightness, chest congestion, postnasal drainage, sore throat, sinus pressure, headache, right-sided ear pain, and fatigue.  She denies fever, shortness of breath, chest pain, nasal congestion, runny nose, sneezing, abdominal pain, nausea/vomiting, diarrhea, decreased appetite, loss of taste or smell, any rash.  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  She has been using her albuterol inhaler which helps with the chest tightness.  Otherwise, not taking anything for supportive care.    Past Medical History:  Diagnosis Date   Asthma    Complication of anesthesia    Heart murmur    Hernia    Hypertension    PONV (postoperative nausea and vomiting)    Pre-diabetes    S/P hernia repair 08/26/2021   SBO (small bowel obstruction) (HCC) 09/15/2021    Patient Active Problem List   Diagnosis Date Noted   Patient non-compliant, refused service 02/08/2022   Aortic atherosclerosis (HCC) 01/17/2022   Morbid obesity (HCC) 01/17/2022   Other fatigue 01/17/2022   Hypocalcemia 01/17/2022   Polyarthralgia 01/17/2022   Muscle spasm 01/17/2022   Vitamin D deficiency 01/17/2022   Recurrent ventral hernia 09/15/2021   Prediabetes 08/26/2020   Urinary incontinence 06/23/2019   Constipation 06/23/2019   BPPV (benign paroxysmal positional vertigo) 03/20/2019   HTN (hypertension) 04/02/2017   Asthma 04/02/2017    Past Surgical History:  Procedure Laterality Date   ABDOMINAL HYSTERECTOMY     one ovary stlil in place. removed uterus cervix and pt thinks fallopian tubes   APPENDECTOMY     CESAREAN SECTION     INCISIONAL HERNIA REPAIR N/A 08/26/2021   Procedure: INCISIONAL HERNIA REPAIR;  Surgeon: [name], MD;  Location: [facility] OR;  Service: General;   Laterality: N/A;   INSERTION OF MESH N/A 08/26/2021   Procedure: INSERTION OF MESH;  Surgeon: [name], MD;  Location: [facility] OR;  Service: General;  Laterality: N/A;   LAPAROTOMY N/A 07/29/2020   Procedure: EXPLORATORY LAPAROTOMY, REPAIR OF VENTRAL HERNIA;  Surgeon: [name], MD;  Location: [facility] OR;  Service: General;  Laterality: N/A;   MYOMECTOMY      OB History   No obstetric history on file.      Home Medications    Prior to Admission medications   Medication Sig Start Date End Date Taking? Authorizing Provider  albuterol (VENTOLIN HFA) 108 (90 Base) MCG/ACT inhaler Inhale 1-2 puffs into the lungs every 6 (six) hours as needed for wheezing or shortness of breath. 01/17/22   Mort Sawyers, FNP  benzonatate (TESSALON) 100 MG capsule Take 1 capsule (100 mg total) by mouth every 8 (eight) hours. Do not take with alcohol or while driving or operating heavy machinery.  May cause drowsiness. 03/14/22   Valentino Nose, NP  ibuprofen (ADVIL) 200 MG tablet Take 200 mg by mouth every 4 (four) hours as needed for headache.    [provider]  losartan-hydrochlorothiazide (HYZAAR) 50-12.5 MG tablet TAKE 1 TABLET BY MOUTH EVERY DAY 02/14/22   Mort Sawyers, FNP  meclizine (ANTIVERT) 25 MG tablet Take 1/2 to 1 tablet po qd prn vertigo 01/17/22   Mort Sawyers, FNP  methocarbamol (ROBAXIN) 500 MG tablet Take 1 tablet (500 mg total) by mouth every 8 (eight) hours as needed  for muscle spasms. 01/17/22   Eugenia Pancoast, FNP  promethazine-dextromethorphan (PROMETHAZINE-DM) 6.25-15 MG/5ML syrup Take 5 mLs by mouth 4 (four) times daily as needed. 02/09/22   Volney American, PA-C  traMADol (ULTRAM) 50 MG tablet Take 1 tablet (50 mg total) by mouth every 6 (six) hours as needed. 09/23/21 09/23/22  Georganna Skeans, MD    Family History Family History  Problem Relation Age of Onset   Hypertension Mother    Stroke Mother    Dementia Mother    Heart disease Mother    Hyperlipidemia  Mother    COPD Father    Rheum arthritis Father    Mental illness Father    Heart disease Maternal Grandmother    Heart disease Maternal Grandfather    Alzheimer's disease Maternal Grandfather    Uterine cancer Paternal Grandmother     Social History Social History   Tobacco Use   Smoking status: Never   Smokeless tobacco: Never  Vaping Use   Vaping Use: Never used  Substance Use Topics   Alcohol use: No   Drug use: No     Allergies   Codeine, Latex, Penicillins, and Sulfa drugs cross reactors   Review of Systems Review of Systems Per HPI  Physical Exam Triage Vital Signs ED Triage Vitals [03/14/22 1357]  Enc Vitals Group     BP 134/82     Pulse Rate 89     Resp 20     Temp 97.9 F (36.6 C)     Temp Source Oral     SpO2 92 %     Weight      Height      Head Circumference      Peak Flow      Pain Score      Pain Loc      Pain Edu?      Excl. in [place]?    No data found.  Updated Vital Signs BP 134/82 (BP Location: Right Arm)   Pulse 89   Temp 97.9 F (36.6 C) (Oral)   Resp 20   SpO2 92%   Visual Acuity Right Eye Distance:   Left Eye Distance:   Bilateral Distance:    Right Eye Near:   Left Eye Near:    Bilateral Near:     Physical Exam Vitals and nursing note reviewed.  Constitutional:      General: She is not in acute distress.    Appearance: Normal appearance. She is not ill-appearing or toxic-appearing.  HENT:     Head: Normocephalic and atraumatic.     Right Ear: Tympanic membrane, ear canal and external ear normal.     Left Ear: Ear canal and external ear normal. Tympanic membrane is erythematous.     Nose: Congestion present. No rhinorrhea.     Mouth/Throat:     Mouth: Mucous membranes are moist.     Pharynx: Oropharynx is clear. Posterior oropharyngeal erythema present. No oropharyngeal exudate.  Eyes:     General: No scleral icterus.    Extraocular Movements: Extraocular movements intact.  Cardiovascular:     Rate and Rhythm:  Normal rate and regular rhythm.  Pulmonary:     Effort: Pulmonary effort is normal. No respiratory distress.     Breath sounds: Normal breath sounds. No wheezing, rhonchi or rales.     Comments: Occasional dry sounding cough.  Patient talking in complete sentences without accessory muscle use. Abdominal:     General: Abdomen is flat. Bowel sounds are normal.  There is no distension.     Palpations: Abdomen is soft.     Tenderness: There is no abdominal tenderness. There is no guarding.  Musculoskeletal:     Cervical back: Normal range of motion and neck supple.  Lymphadenopathy:     Cervical: No cervical adenopathy.  Skin:    General: Skin is warm and dry.     Coloration: Skin is not jaundiced or pale.     Findings: No erythema or rash.  Neurological:     Mental Status: She is alert and oriented to person, place, and time.  Psychiatric:        Behavior: Behavior is cooperative.      UC Treatments / Results  Labs (all labs ordered are listed, but only abnormal results are displayed) Labs Reviewed  RESP PANEL BY RT-PCR (FLU A&B, COVID) ARPGX2    EKG   Radiology No results found.  Procedures Procedures (including critical care time)  Medications Ordered in UC Medications - No data to display  Initial Impression / Assessment and Plan / UC Course  I have reviewed the triage vital signs and the nursing notes.  Pertinent labs & imaging results that were available during my care of the patient were reviewed by me and considered in my medical decision making (see chart for details).   Patient is well-appearing, normotensive, afebrile, not tachycardic, not tachypneic, oxygenating well on room air.    Viral URI with cough Encounter for screening for COVID-19 Suspect viral etiology No wheezing on examination today COVID 19, influenza testing obtained Patient is a good candidate for antiviral therapy such as Tamiflu, Monday.  We are if she is positive Left ear is red-likely  viral and patient is not having left ear pain Supportive care discussed Start cough suppressant, increase hydration with water Note given for work  The patient was given the opportunity to ask questions.  All questions answered to their satisfaction.  The patient is in agreement to this plan.    Final Clinical Impressions(s) / UC Diagnoses   Final diagnoses:  Viral URI with cough  Encounter for screening for COVID-19     Discharge Instructions      You have a viral upper respiratory infection.  Symptoms should improve over the next week to 10 days.  If you develop chest pain or shortness of breath, go to the emergency room.  Continue the albuterol inhaler every 4-6 hours as you need it for wheezing or shortness of breath.  We have tested you today for COVID-19 and influenza.  You will see the results in Mychart and we will call you with positive results.    Please stay home and isolate until you are aware of the results.    Some things that can make you feel better are: - Increased rest - Increasing fluid with water/sugar free electrolytes - Acetaminophen and ibuprofen as needed for fever/pain - Salt water gargling, chloraseptic spray and throat lozenges - OTC guaifenesin (Mucinex) 600 mg twice daily - Saline sinus flushes or a neti pot - Humidifying the air -Tessalon Perles during the day as needed for dry cough and cough syrup at nighttime as needed for dry cough     ED Prescriptions     Medication Sig Dispense Auth. Provider   benzonatate (TESSALON) 100 MG capsule Take 1 capsule (100 mg total) by mouth every 8 (eight) hours. Do not take with alcohol or while driving or operating heavy machinery.  May cause drowsiness. 21 capsule Cathlean Marseilles  A, NP      PDMP not reviewed this encounter.   Valentino Nose, NP 03/14/22 1430

## 2022-03-14 NOTE — Discharge Instructions (Signed)
You have a viral upper respiratory infection.  Symptoms should improve over the next week to 10 days.  If you develop chest pain or shortness of breath, go to the emergency room.  Continue the albuterol inhaler every 4-6 hours as you need it for wheezing or shortness of breath.  We have tested you today for COVID-19 and influenza.  You will see the results in Mychart and we will call you with positive results.    Please stay home and isolate until you are aware of the results.    Some things that can make you feel better are: - Increased rest - Increasing fluid with water/sugar free electrolytes - Acetaminophen and ibuprofen as needed for fever/pain - Salt water gargling, chloraseptic spray and throat lozenges - OTC guaifenesin (Mucinex) 600 mg twice daily - Saline sinus flushes or a neti pot - Humidifying the air -Tessalon Perles during the day as needed for dry cough and cough syrup at nighttime as needed for dry cough

## 2022-03-15 ENCOUNTER — Other Ambulatory Visit: Payer: Self-pay | Admitting: Family

## 2022-03-17 ENCOUNTER — Telehealth: Payer: Self-pay | Admitting: Family

## 2022-03-17 ENCOUNTER — Other Ambulatory Visit: Payer: Self-pay

## 2022-03-17 NOTE — Telephone Encounter (Signed)
Rx request sent to [place]

## 2022-03-17 NOTE — Telephone Encounter (Signed)
Caller Name: Angelea  Call back phone #: [phone]  MEDICATION(S):   losartan-hydrochlorothiazide (HYZAAR) 50-12.5 MG tablet     Days of Med Remaining:   Has the patient contacted their pharmacy (YES/NO)? YES What did pharmacy advise?  Pharmacy stated they've tried contacting pcp  Preferred Pharmacy:  [facility] [address]   ~~~Please advise patient/caregiver to allow 2-3 business days to process RX refills.

## 2022-03-20 ENCOUNTER — Encounter: Payer: Self-pay | Admitting: Family

## 2022-04-16 ENCOUNTER — Other Ambulatory Visit: Payer: Self-pay | Admitting: Family

## 2022-04-16 DIAGNOSIS — J4520 Mild intermittent asthma, uncomplicated: Secondary | ICD-10-CM

## 2022-04-27 ENCOUNTER — Encounter: Payer: Self-pay | Admitting: Emergency Medicine

## 2022-04-27 ENCOUNTER — Ambulatory Visit
Admission: EM | Admit: 2022-04-27 | Discharge: 2022-04-27 | Disposition: A | Discharge status: Home / Self Care | Payer: Self-pay | Attending: Nurse Practitioner | Admitting: Nurse Practitioner

## 2022-04-27 DIAGNOSIS — Z20828 Contact with and (suspected) exposure to other viral communicable diseases: Secondary | ICD-10-CM | POA: Insufficient documentation

## 2022-04-27 DIAGNOSIS — R059 Cough, unspecified: Secondary | ICD-10-CM | POA: Insufficient documentation

## 2022-04-27 DIAGNOSIS — Z1152 Encounter for screening for COVID-19: Secondary | ICD-10-CM | POA: Insufficient documentation

## 2022-04-27 DIAGNOSIS — R350 Frequency of micturition: Secondary | ICD-10-CM | POA: Insufficient documentation

## 2022-04-27 DIAGNOSIS — R309 Painful micturition, unspecified: Secondary | ICD-10-CM | POA: Insufficient documentation

## 2022-04-27 DIAGNOSIS — J45909 Unspecified asthma, uncomplicated: Secondary | ICD-10-CM | POA: Insufficient documentation

## 2022-04-27 DIAGNOSIS — J069 Acute upper respiratory infection, unspecified: Secondary | ICD-10-CM | POA: Insufficient documentation

## 2022-04-27 LAB — POCT URINALYSIS DIP (MANUAL ENTRY)
Bilirubin, UA: NEGATIVE
Blood, UA: NEGATIVE
Glucose, UA: NEGATIVE mg/dL
Ketones, POC UA: NEGATIVE mg/dL
Leukocytes, UA: NEGATIVE
Nitrite, UA: NEGATIVE
Protein Ur, POC: NEGATIVE mg/dL
Spec Grav, UA: 1.015 (ref 1.010–1.025)
Urobilinogen, UA: 0.2 E.U./dL
pH, UA: 6 (ref 5.0–8.0)

## 2022-04-27 LAB — RESP PANEL BY RT-PCR (RSV, FLU A&B, COVID)  RVPGX2
Influenza A by PCR: NEGATIVE
Influenza B by PCR: NEGATIVE
Resp Syncytial Virus by PCR: POSITIVE — AB
SARS Coronavirus 2 by RT PCR: NEGATIVE

## 2022-04-27 NOTE — ED Provider Notes (Signed)
[facility]    CSN: [id number] Arrival date & time: 04/27/22  1418      History   Chief Complaint No chief complaint on file.   HPI Raynald KempLisa P Thwaites is a 56 y.o. female.   The history is provided by the patient.   Patient presents for complaints of upper respiratory symptoms and urinary frequency.  Upper respiratory symptoms have been present over the past 24 hours.  Patient states that her throat hurts when she is coughing and she has also developed a runny nose.  Patient denies fever, chills, headache, difficulty breathing, or GI symptoms.  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  Patient reports a history of asthma and seasonal allergies.  Patient also presents for increased urination and burning with urination that has been consistent over the past month.  She states that she has noticed that she has had increased frequency at nighttime.  She denies fever, chills, abdominal pain, low back pain, flank pain, nausea, vomiting, or diarrhea.  She states that currently she does drink both water and tea.   Past Medical History:  Diagnosis Date   Asthma    Complication of anesthesia    Heart murmur    Hernia    Hypertension    PONV (postoperative nausea and vomiting)    Pre-diabetes    S/P hernia repair 08/26/2021   SBO (small bowel obstruction) (HCC) 09/15/2021    Patient Active Problem List   Diagnosis Date Noted   Patient non-compliant, refused service 02/08/2022   Aortic atherosclerosis (HCC) 01/17/2022   Morbid obesity (HCC) 01/17/2022   Other fatigue 01/17/2022   Hypocalcemia 01/17/2022   Polyarthralgia 01/17/2022   Muscle spasm 01/17/2022   Vitamin D deficiency 01/17/2022   Recurrent ventral hernia 09/15/2021   Prediabetes 08/26/2020   Urinary incontinence 06/23/2019   Constipation 06/23/2019   BPPV (benign paroxysmal positional vertigo) 03/20/2019   HTN (hypertension) 04/02/2017    Asthma 04/02/2017    Past Surgical History:  Procedure Laterality Date   ABDOMINAL HYSTERECTOMY     one ovary stlil in place. removed uterus cervix and pt thinks fallopian tubes   APPENDECTOMY     CESAREAN SECTION     INCISIONAL HERNIA REPAIR N/A 08/26/2021   Procedure: INCISIONAL HERNIA REPAIR;  Surgeon: Violeta Gelinashompson, Burke, MD;  [facility];  Service: General;  Laterality: N/A;   INSERTION OF MESH N/A 08/26/2021   Procedure: INSERTION OF MESH;  Surgeon: Violeta Gelinashompson, Burke, MD;  [facility];  Service: General;  Laterality: N/A;   LAPAROTOMY N/A 07/29/2020   Procedure: EXPLORATORY LAPAROTOMY, REPAIR OF VENTRAL HERNIA;  Surgeon: Violeta Gelinashompson, Burke, MD;  [facility];  Service: General;  Laterality: N/A;   MYOMECTOMY      OB History   No obstetric history on file.      Home Medications    Prior to Admission medications   Medication Sig Start Date End Date Taking? Authorizing Provider  albuterol (VENTOLIN HFA) 108 (90 Base) MCG/ACT inhaler INHALE 1-2 PUFFS INTO THE LUNGS UP TO EVERY 6HRS AS NEEDED FOR WHEEZING/SHORTNESS OF BREATH 04/18/22   Mort Sawyersugal, Tabitha, FNP  benzonatate (TESSALON) 100 MG capsule Take 1 capsule (100 mg total) by mouth every 8 (eight) hours. Do not take with alcohol or while driving or operating heavy machinery.  May cause drowsiness. 03/14/22   Valentino NoseMartinez, Jessica A, NP  ibuprofen (ADVIL) 200 MG tablet Take 200 mg by mouth  every 4 (four) hours as needed for headache.    [provider]  losartan-hydrochlorothiazide (HYZAAR) 50-12.5 MG tablet TAKE 1 TABLET BY MOUTH EVERY DAY 03/20/22   Mort Sawyers, FNP  meclizine (ANTIVERT) 25 MG tablet Take 1/2 to 1 tablet po qd prn vertigo 01/17/22   Mort Sawyers, FNP  methocarbamol (ROBAXIN) 500 MG tablet Take 1 tablet (500 mg total) by mouth every 8 (eight) hours as needed for muscle spasms. 01/17/22   Mort Sawyers, FNP  promethazine-dextromethorphan (PROMETHAZINE-DM) 6.25-15 MG/5ML syrup Take 5 mLs by mouth 4 (four)  times daily as needed. 02/09/22   Particia Nearing, PA-C  traMADol (ULTRAM) 50 MG tablet Take 1 tablet (50 mg total) by mouth every 6 (six) hours as needed. 09/23/21 09/23/22  [name], MD    Family History Family History  Problem Relation Age of Onset   Hypertension Mother    Stroke Mother    Dementia Mother    Heart disease Mother    Hyperlipidemia Mother    COPD Father    Rheum arthritis Father    Mental illness Father    Heart disease Maternal Grandmother    Heart disease Maternal Grandfather    Alzheimer's disease Maternal Grandfather    Uterine cancer Paternal Grandmother     Social History Social History   Tobacco Use   Smoking status: Never   Smokeless tobacco: Never  Vaping Use   Vaping Use: Never used  Substance Use Topics   Alcohol use: No   Drug use: No     Allergies   Codeine, Latex, Penicillins, and Sulfa drugs cross reactors   Review of Systems Review of Systems Per HPI  Physical Exam Triage Vital Signs ED Triage Vitals  Enc Vitals Group     BP 04/27/22 1457 (!) 142/84     Pulse Rate 04/27/22 1457 80     Resp 04/27/22 1457 18     Temp 04/27/22 1457 98 F (36.7 C)     Temp Source 04/27/22 1457 Oral     SpO2 04/27/22 1457 97 %     Weight --      Height --      Head Circumference --      Peak Flow --      Pain Score 04/27/22 1459 3     Pain Loc --      Pain Edu? --      Excl. in GC? --    No data found.  Updated Vital Signs BP (!) 142/84 (BP Location: Right Arm)   Pulse 80   Temp 98 F (36.7 C) (Oral)   Resp 18   SpO2 97%   Visual Acuity Right Eye Distance:   Left Eye Distance:   Bilateral Distance:    Right Eye Near:   Left Eye Near:    Bilateral Near:     Physical Exam Vitals and nursing note reviewed.  Constitutional:      General: She is not in acute distress.    Appearance: Normal appearance.  HENT:     Head: Normocephalic.     Right Ear: Tympanic membrane, ear canal and external ear normal.     Left  Ear: Tympanic membrane, ear canal and external ear normal.     Nose: Congestion present.     Right Turbinates: Enlarged and swollen.     Left Turbinates: Enlarged and swollen.     Right Sinus: No maxillary sinus tenderness or frontal sinus tenderness.     Left Sinus:  No maxillary sinus tenderness or frontal sinus tenderness.     Mouth/Throat:     Lips: Pink.     Mouth: Mucous membranes are moist.     Pharynx: Uvula midline. Posterior oropharyngeal erythema present. No pharyngeal swelling, oropharyngeal exudate or uvula swelling.     Tonsils: No tonsillar exudate.  Eyes:     Extraocular Movements: Extraocular movements intact.     Pupils: Pupils are equal, round, and reactive to light.  Cardiovascular:     Rate and Rhythm: Normal rate and regular rhythm.     Pulses: Normal pulses.     Heart sounds: Normal heart sounds.  Pulmonary:     Effort: Pulmonary effort is normal.     Breath sounds: Normal breath sounds.  Abdominal:     General: Bowel sounds are normal.     Palpations: Abdomen is soft.     Tenderness: There is no abdominal tenderness.  Musculoskeletal:     Cervical back: Normal range of motion.  Lymphadenopathy:     Cervical: No cervical adenopathy.  Skin:    General: Skin is warm and dry.  Neurological:     General: No focal deficit present.     Mental Status: She is alert and oriented to person, place, and time.  Psychiatric:        Mood and Affect: Mood normal.        Behavior: Behavior normal.      UC Treatments / Results  Labs (all labs ordered are listed, but only abnormal results are displayed) Labs Reviewed  RESP PANEL BY RT-PCR (RSV, FLU A&B, COVID)  [id number]  POCT URINALYSIS DIP (MANUAL ENTRY)    EKG   Radiology No results found.  Procedures Procedures (including critical care time)  Medications Ordered in UC Medications - No data to display  Initial Impression / Assessment and Plan / UC Course  I have reviewed the triage vital signs and the  nursing notes.  Pertinent labs & imaging results that were available during my care of the patient were reviewed by me and considered in my medical decision making (see chart for details).  The patient is well-appearing, she is in no acute distress, vital signs are stable.  She is mildly hypertensive however.  Patient is speaking in complete sentences, and lung sounds are clear throughout.  She has been afebrile since her symptoms started.  Symptoms are consistent with a viral upper respiratory infection.  Given her recent exposure, will perform COVID/flu/RSV testing.  Patient reports that she has several medications at home that she can take for her cough, advised patient to continue with that plan of care.  With regard to her urinary symptoms, her urinalysis is negative for suspicion of urinary tract infection.  Patient is a candidate for Paxlovid, with confirmation that she is not on medication for hyperlipidemia.  Chart was reviewed, and did not see medication patient's medication list.  Supportive care recommendations were provided to the patient.  Patient verbalizes understanding.  All questions were answered.  Patient stable for discharge.   Final Clinical Impressions(s) / UC Diagnoses   Final diagnoses:  Viral upper respiratory tract infection with cough  Exposure to respiratory syncytial virus (RSV)  Urinary frequency     Discharge Instructions      You have a viral upper respiratory infection.  Symptoms should improve over the next week to 10 days.  If you develop chest pain or shortness of breath, go to the emergency room.   Continue the albuterol  inhaler every 4-6 hours as you need it for wheezing or shortness of breath.   We have tested you today for COVID-19/flu/RSV.  You will see the results in Mychart and we will call you with positive results. Please stay home and isolate until you are aware of the results.     Some things that can make you feel better are: - Increased  rest - Increasing fluid with water/sugar free electrolytes - Acetaminophen and ibuprofen as needed for fever/pain - Salt water gargling, chloraseptic spray and throat lozenges - OTC guaifenesin (Mucinex) 600 mg twice daily - Saline sinus flushes or a neti pot - Humidifying the air -Continue Tessalon Perles previously prescribed during the day as needed for dry cough and cough syrup at nighttime as needed for dry cough     ED Prescriptions   None    PDMP not reviewed this encounter.   Abran Cantor, NP 04/27/22 1553

## 2022-04-27 NOTE — ED Triage Notes (Signed)
Cough since yesterday.   exposed to RSV from 2 kids.  States throat hurts when coughing, runny nose.  States she has frequent urination with burning on urination x 1 week.

## 2022-04-27 NOTE — Discharge Instructions (Addendum)
You have a viral upper respiratory infection.  Symptoms should improve over the next week to 10 days.  If you develop chest pain or shortness of breath, go to the emergency room.   Continue the albuterol inhaler every 4-6 hours as you need it for wheezing or shortness of breath.   We have tested you today for COVID-19/flu/RSV.  You will see the results in Mychart and we will call you with positive results. Please stay home and isolate until you are aware of the results.     Some things that can make you feel better are: - Increased rest - Increasing fluid with water/sugar free electrolytes - Acetaminophen and ibuprofen as needed for fever/pain - Salt water gargling, chloraseptic spray and throat lozenges - OTC guaifenesin (Mucinex) 600 mg twice daily - Saline sinus flushes or a neti pot - Humidifying the air -Continue Tessalon Perles previously prescribed during the day as needed for dry cough and cough syrup at nighttime as needed for dry cough

## 2022-04-28 ENCOUNTER — Telehealth (HOSPITAL_COMMUNITY): Payer: Self-pay | Admitting: Emergency Medicine

## 2022-04-28 DIAGNOSIS — Z1152 Encounter for screening for COVID-19: Secondary | ICD-10-CM

## 2022-04-28 DIAGNOSIS — J069 Acute upper respiratory infection, unspecified: Secondary | ICD-10-CM

## 2022-04-28 MED ORDER — BENZONATATE 100 MG PO CAPS: 100.0000 mg | ORAL_CAPSULE | Freq: Three times a day (TID) | ORAL | 0 refills | Status: DC

## 2022-06-03 IMAGING — CT CT ABD-PELV W/ CM
2 of 5 series · 16 of 46 positions shown, 18 images · IV contrast (APPLIED)
Comparison: CT abdomen and pelvis dated June 06, 2021

CLINICAL DATA: Nausea and vomiting

EXAM:
CT ABDOMEN AND PELVIS WITH CONTRAST
TECHNIQUE: Multidetector CT imaging of the abdomen and pelvis was performed
using the standard protocol following bolus administration of
intravenous contrast.

[Series 3: abdomen 5.0 · axial · 0.98mm/px · z∈[+766,+1186]mm · 13 of 98 slices shown, 15 images]
[im 7/98  soft-tissue]
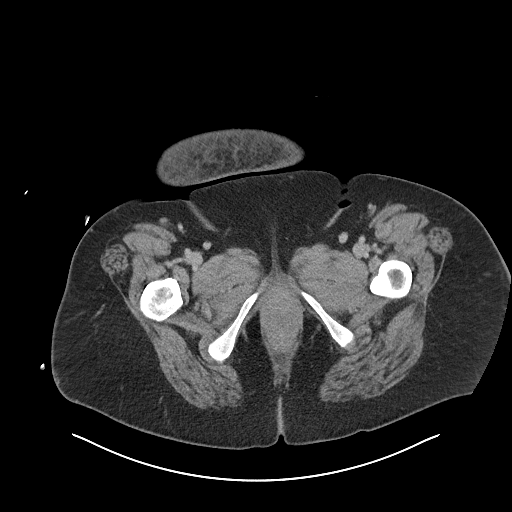
[im 7/98  bone]
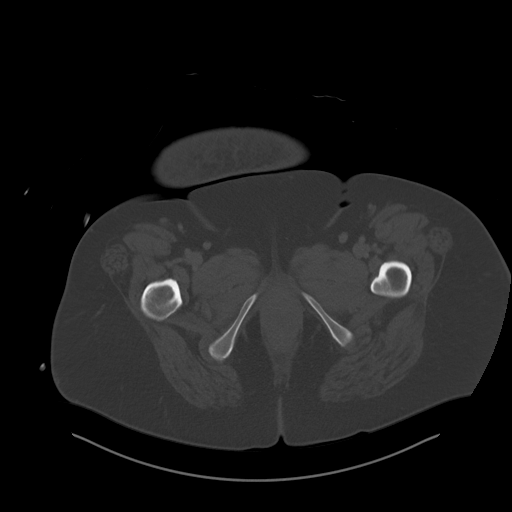
[im 13/98  soft-tissue]
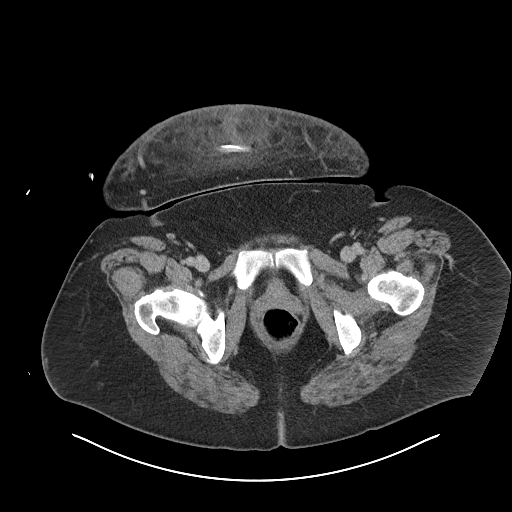
[im 19/98  soft-tissue]
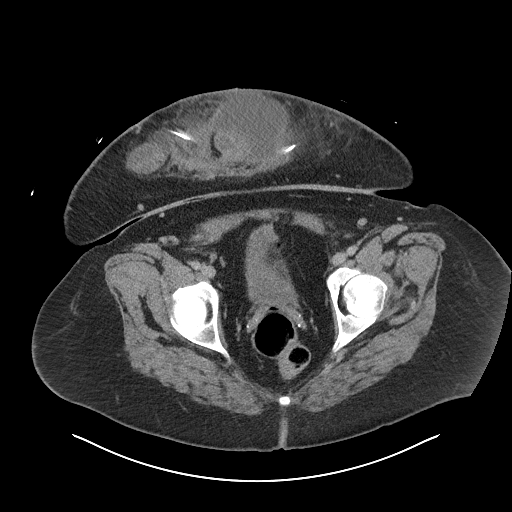
[im 31/98  soft-tissue]
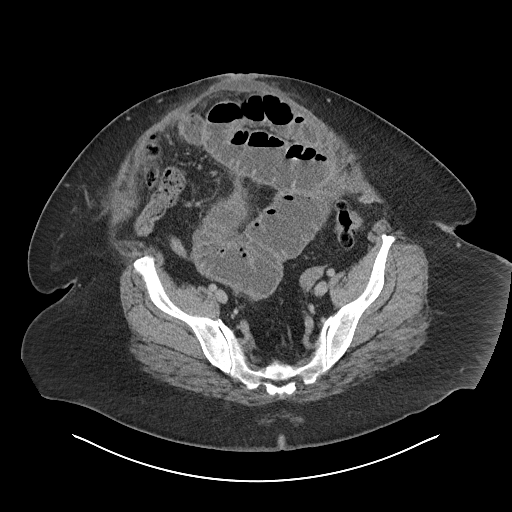
[im 37/98  soft-tissue]
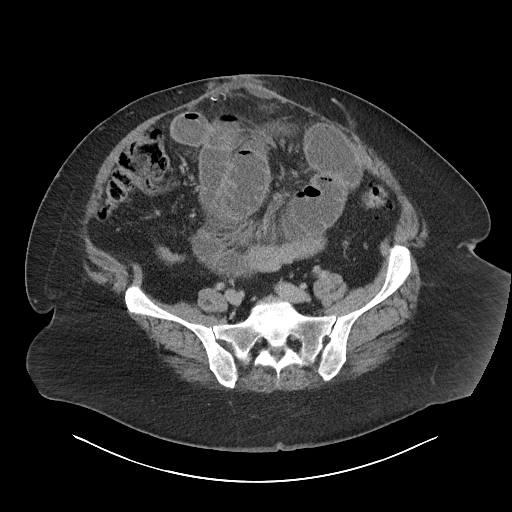
[im 43/98  soft-tissue]
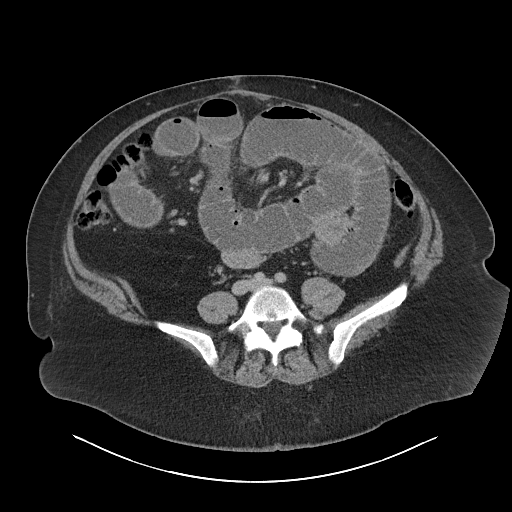
[im 49/98  soft-tissue]
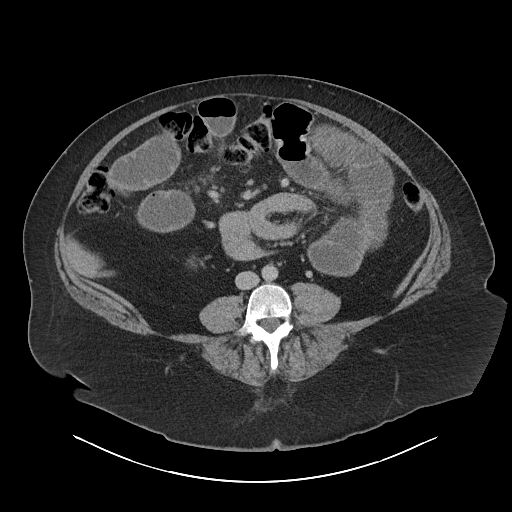
[im 55/98  soft-tissue]
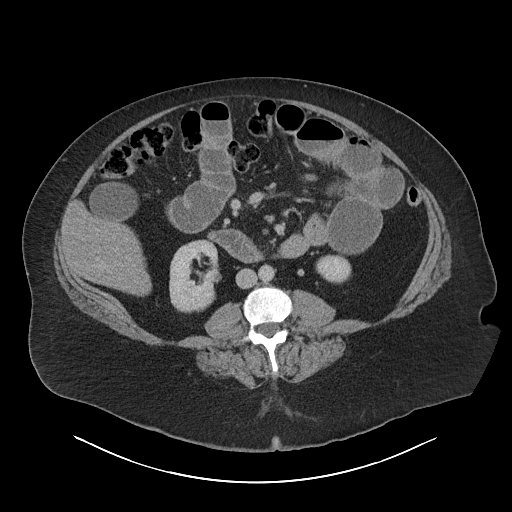
[im 61/98  soft-tissue]
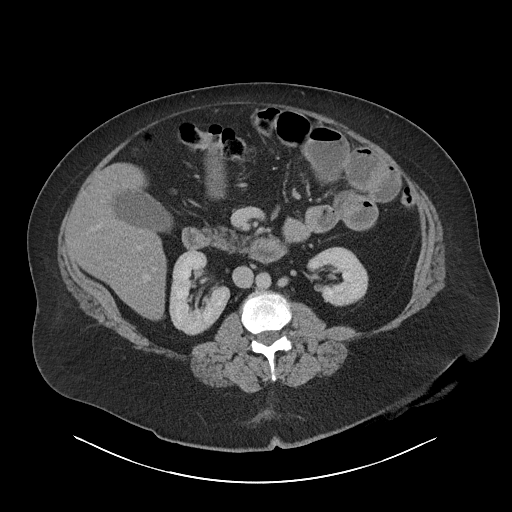
[im 61/98  bone]
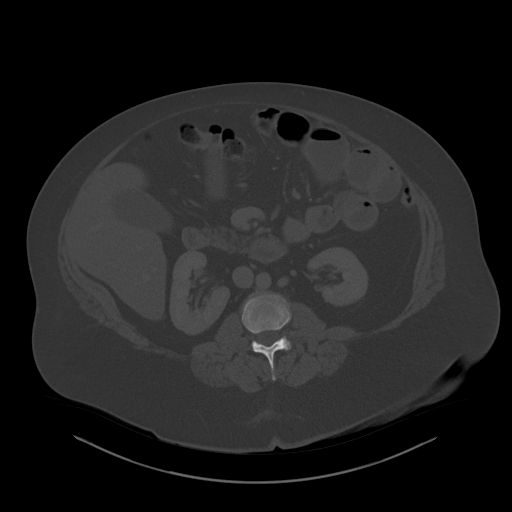
[im 67/98  soft-tissue]
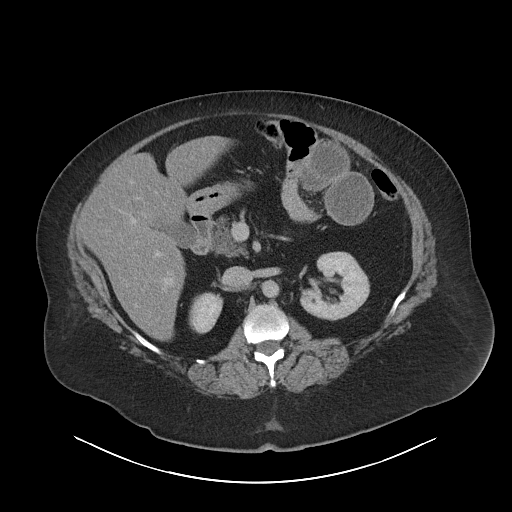
[im 79/98  soft-tissue]
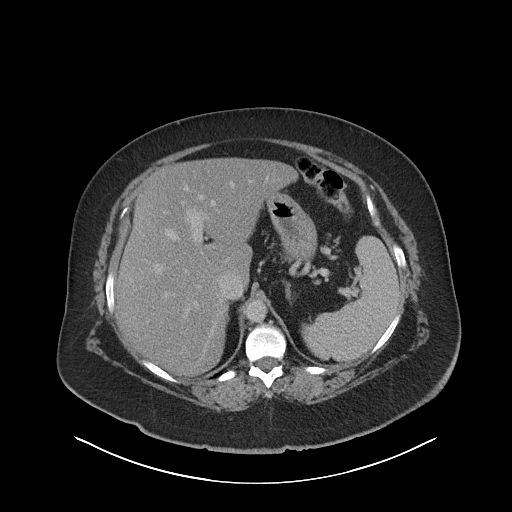
[im 85/98  soft-tissue]
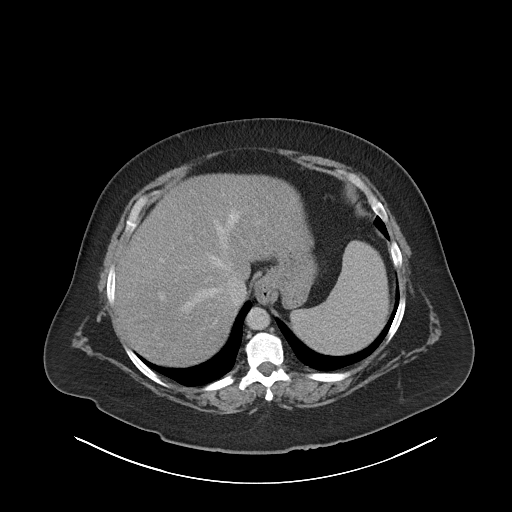
[im 91/98  soft-tissue]
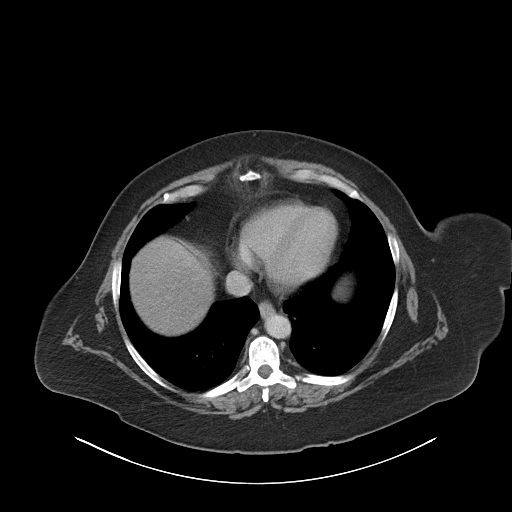

[Series 6: abdomen 3.0 mpr cor · coronal · 0.97mm/px · 3 of 136 slices shown]
[im 46/136  soft-tissue]
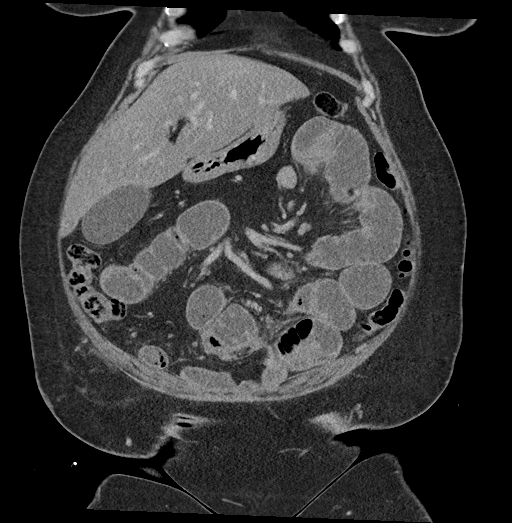
[im 61/136  soft-tissue]
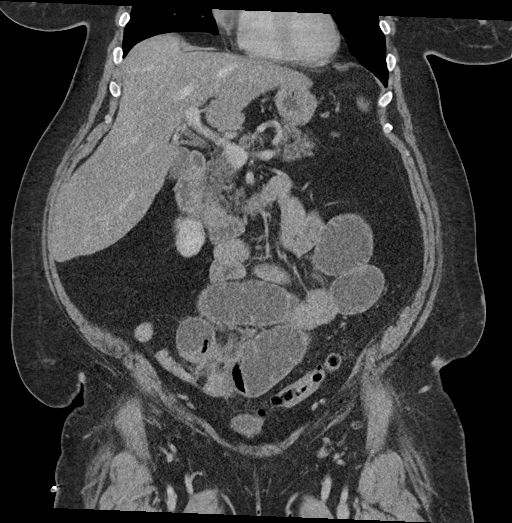
[im 76/136  soft-tissue]
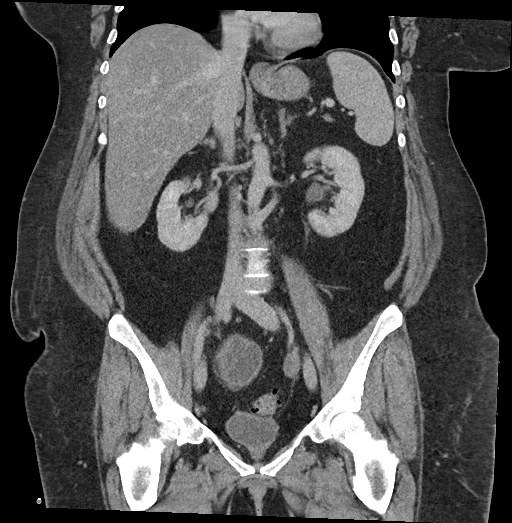

[16 of 46 positions shown; findings below may reference images not displayed]

RADIATION DOSE REDUCTION: This exam was performed according to the
departmental dose-optimization program which includes automated
exposure control, adjustment of the mA and/or kV according to
patient size and/or use of iterative reconstruction technique.

CONTRAST:  100mL OMNIPAQUE IOHEXOL 300 MG/ML  SOLN
FINDINGS: Lower chest: No acute abnormality.

Hepatobiliary: No focal liver abnormality is seen. No gallstones,
gallbladder wall thickening, or biliary dilatation.

Pancreas: Unremarkable. No pancreatic ductal dilatation or
surrounding inflammatory changes.

Spleen: Normal in size without focal abnormality.

Adrenals/Urinary Tract: Bilateral adrenal glands are unremarkable.
Low-attenuation lesion of the upper pole of the left kidney is
decreased in size when compared with prior exam, measures 8 mm on
series 6, image 80, previously 1.5 cm, likely due to interval
rupture of simple renal cysts. Lesion of the mid region of the left
kidney is unchanged in size when compared with prior exam and too
small to completely characterize.

Stomach/Bowel: Stomach is normal in appearance. Multiple dilated
loops of proximal small bowel are seen with a transition point at
the entrance of a large ventral abdominal wall hernia seen on series
3, image 70. Diverticulosis. No wall thickening or inflammatory
change.

Vascular/Lymphatic: Mild aortic atherosclerosis. No enlarged
abdominal or pelvic lymph nodes.

Reproductive: Prior hysterectomy.  No adnexal mass.

Other: Postsurgical changes of interval hernia repair with loculated
anterior abdominal wall fluid which is likely due to seroma. Overall
size of hernia is decreased when compared with prior exam although
there is a persistent hernia sac which is concerning for recurrence.
No free intraperitoneal fluid or free air.

Musculoskeletal: No acute or significant osseous findings.
IMPRESSION: 1. Multiple dilated loops of small bowel with transition point at
the site of a ventral abdominal hernia. Findings are compatible with
small-bowel obstruction.
2. Postsurgical changes of interval hernia repair with loculated
anterior abdominal wall fluid which is likely due to seroma. Overall
size of hernia is decreased when compared with prior exam although
there is a persistent large hernia sac which is concerning for
recurrence.

## 2022-06-16 DIAGNOSIS — K432 Incisional hernia without obstruction or gangrene: Secondary | ICD-10-CM | POA: Diagnosis not present

## 2022-06-19 ENCOUNTER — Other Ambulatory Visit: Payer: Self-pay | Admitting: Family

## 2022-06-19 ENCOUNTER — Other Ambulatory Visit: Payer: Self-pay | Admitting: Surgery

## 2022-06-19 DIAGNOSIS — J4520 Mild intermittent asthma, uncomplicated: Secondary | ICD-10-CM

## 2022-06-19 DIAGNOSIS — K432 Incisional hernia without obstruction or gangrene: Secondary | ICD-10-CM

## 2022-07-14 ENCOUNTER — Other Ambulatory Visit: Payer: Self-pay

## 2022-07-14 ENCOUNTER — Emergency Department (HOSPITAL_COMMUNITY)
Admission: EM | Admit: 2022-07-14 | Discharge: 2022-07-14 | Disposition: A | Discharge status: Home / Self Care | Payer: 59 | Attending: Emergency Medicine | Admitting: Emergency Medicine

## 2022-07-14 DIAGNOSIS — M436 Torticollis: Secondary | ICD-10-CM | POA: Diagnosis not present

## 2022-07-14 DIAGNOSIS — M542 Cervicalgia: Secondary | ICD-10-CM | POA: Diagnosis not present

## 2022-07-14 MED ORDER — KETOROLAC TROMETHAMINE 30 MG/ML IJ SOLN
30.0000 mg | Freq: Once | INTRAMUSCULAR | Status: AC
  Administered 2022-07-14: 30 mg via INTRAVENOUS
  Filled 2022-07-14: qty 1

## 2022-07-14 MED ORDER — METHOCARBAMOL 500 MG PO TABS: 500.0000 mg | ORAL_TABLET | Freq: Two times a day (BID) | ORAL | 0 refills | Status: DC

## 2022-07-14 MED ORDER — OXYCODONE-ACETAMINOPHEN 5-325 MG PO TABS
1.0000 | ORAL_TABLET | Freq: Once | ORAL | Status: AC
  Administered 2022-07-14: 1 via ORAL
  Filled 2022-07-14: qty 1

## 2022-07-14 NOTE — ED Notes (Signed)
Pt to room from waiting room. Pt complains of neck pain at this time. Pt hooked up to monitors. Vitals stable. Call bell within reach. No other acute distress noted at this time.

## 2022-07-14 NOTE — Discharge Instructions (Addendum)
Take the methocarbamol that you have at home every 8 hours for muscle tightness.  Apply heat and use gentle stretching.  This should improve in the next few days.

## 2022-07-14 NOTE — ED Provider Notes (Signed)
[facility] Emergency Department Provider Note MRN:  [id number]  Arrival date & time: 07/14/22     Chief Complaint   Neck Pain    History of Present Illness   Kaitlyn Bray is a 57 y.o. year-old female presents to the ED with chief complaint of right-sided neck pain.  She states that she woke up with the symptoms a couple of days ago states that they are gradually worsened over the past few days.  Pain was significantly worse this morning when she woke up.  Her pain is worsened with head movement and neck movement.  She describes tightness sensation into the occipital scalp.  She denies any numbness, weakness, or tingling. Tried applying ice, but denies any other treatments prior to arrival.  History provided by patient.   Review of Systems  Pertinent positive and negative review of systems noted in HPI.    Physical Exam   Vitals:   07/14/22 0406  BP: (!) 166/82  Pulse: 75  Resp: 18  Temp: (!) 97.5 F (36.4 C)  SpO2: 95%    CONSTITUTIONAL:  well-appearing, NAD NEURO:  Alert and oriented x 3, CN 3-12 grossly intact EYES:  eyes equal and reactive ENT/NECK:  Supple, no stridor  CARDIO:  normal rate, appears well-perfused  PULM:  No respiratory distress,  GI/GU:  non-distended,  MSK/SPINE:  No gross deformities, no edema, moves all extremities, right-sided upper trapezius tender to palpation, decreased range of motion of neck secondary to pain SKIN:  no rash, atraumatic   *Additional and/or pertinent findings included in MDM below  Diagnostic and Interventional Summary    EKG Interpretation  Date/Time:    Ventricular Rate:    PR Interval:    QRS Duration:   QT Interval:    QTC Calculation:   R Axis:     Text Interpretation:         Labs Reviewed - No data to display  No orders to display    Medications  ketorolac (TORADOL) 30 MG/ML injection 30 mg (30 mg Intravenous Given 07/14/22 0504)  oxyCODONE-acetaminophen (PERCOCET/ROXICET)  5-325 MG per tablet 1 tablet (1 tablet Oral Given 07/14/22 0504)     Procedures  /  Critical Care Procedures  ED Course and Medical Decision Making  I have reviewed the triage vital signs, the nursing notes, and pertinent available records from the EMR.  Social Determinants Affecting Complexity of Care: Patient has no clinically significant social determinants affecting this chief complaint..   ED Course:    Medical Decision Making Patient here with right-sided neck pain.  Pain seems to be muscular.  It is reproducible with palpation.  It is worsened with movement.  Onset was after sleeping 2 or 3 nights ago.  No treatments tried.  Will give dose of Percocet and IM toradol for symptom relief.  Plan for discharge with muscle relaxer.  We discussed that she could have a pinched nerve, but I think this is all muscular.  She asked about stroke, but she does not have any strokelike symptoms.  I think this is less likely.  I do not think she requires any further emergent workup and that she is safe to follow-up with her PCP.  Feels improved after treatment.  Has muscle relaxer at home, but hasn't taken it.  I've encouraged her to do so.  PCP follow-up.  Risk Prescription drug management.     Consultants: No consultations were needed in caring for this patient.   Treatment and Plan:  Emergency department workup does not suggest an emergent condition requiring admission or immediate intervention beyond  what has been performed at this time. The patient is safe for discharge and has  been instructed to return immediately for worsening symptoms, change in  symptoms or any other concerns    Final Clinical Impressions(s) / ED Diagnoses     ICD-10-CM   1. Torticollis  M43.6       ED Discharge Orders     None         Discharge Instructions Discussed with and Provided to Patient:     Discharge Instructions      Take the methocarbamol that you have at home every 8 hours for  muscle tightness.  Apply heat and use gentle stretching.  This should improve in the next few days.       Montine Circle, PA-C 07/14/22 0542    Maudie Flakes, MD 07/14/22 0700

## 2022-07-14 NOTE — ED Triage Notes (Signed)
Patient reports right posterior neck pain onset last Sunday , denies injury , pain increases with movement/changing positions unrelieved by OTC pain medication and ice pack .

## 2022-07-19 ENCOUNTER — Encounter: Payer: Self-pay | Admitting: Emergency Medicine

## 2022-07-19 ENCOUNTER — Other Ambulatory Visit: Payer: Self-pay

## 2022-07-19 ENCOUNTER — Ambulatory Visit
Admission: EM | Admit: 2022-07-19 | Discharge: 2022-07-19 | Disposition: A | Discharge status: Home / Self Care | Payer: 59 | Attending: Nurse Practitioner | Admitting: Nurse Practitioner

## 2022-07-19 DIAGNOSIS — H6993 Unspecified Eustachian tube disorder, bilateral: Secondary | ICD-10-CM | POA: Diagnosis not present

## 2022-07-19 MED ORDER — CETIRIZINE HCL 10 MG PO TABS: 10.0000 mg | ORAL_TABLET | Freq: Every day | ORAL | 0 refills | Status: DC

## 2022-07-19 MED ORDER — PREDNISONE 20 MG PO TABS: 20.0000 mg | ORAL_TABLET | Freq: Every day | ORAL | 0 refills | Status: AC

## 2022-07-19 NOTE — ED Provider Notes (Signed)
[facility]    CSN: [id number] Arrival date & time: 07/19/22  [id number]      History   Chief Complaint No chief complaint on file.   HPI Kaitlyn Bray is a 57 y.o. female.   The history is provided by the patient.   The patient presents for complaints of right ear pain.  Patient states symptoms started over the past 2 to 3 days.  She informs that she was seen in the emergency department for right-sided neck pain at the beginning of the month.  She states that she has noticed ringing in her ears, and she has pain behind the ear.  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  She denies fever, chills, headache, runny nose, sore throat, ear drainage, cough, abdominal pain, nausea, vomiting, or diarrhea.  Patient states she has been taking a muscle relaxer and Advil for her neck.  She states she normally uses Flonase nasal spray, but has not been using it consistently.  He states that her ear pain is not as bad right now.  Past Medical History:  Diagnosis Date   Asthma    Complication of anesthesia    Heart murmur    Hernia    Hypertension    PONV (postoperative nausea and vomiting)    Pre-diabetes    S/P hernia repair 08/26/2021   SBO (small bowel obstruction) ([place]) 09/15/2021    Patient Active Problem List   Diagnosis Date Noted   Patient non-compliant, refused service 02/08/2022   Aortic atherosclerosis ([place]) 01/17/2022   Morbid obesity ([place]) 01/17/2022   Other fatigue 01/17/2022   Hypocalcemia 01/17/2022   Polyarthralgia 01/17/2022   Muscle spasm 01/17/2022   Vitamin D deficiency 01/17/2022   Recurrent ventral hernia 09/15/2021   Prediabetes 08/26/2020   Urinary incontinence 06/23/2019   Constipation 06/23/2019   BPPV (benign paroxysmal positional vertigo) 03/20/2019   HTN (hypertension) 04/02/2017   Asthma 04/02/2017    Past Surgical History:  Procedure Laterality Date   ABDOMINAL  HYSTERECTOMY     one ovary stlil in place. removed uterus cervix and pt thinks fallopian tubes   APPENDECTOMY     CESAREAN SECTION     INCISIONAL HERNIA REPAIR N/A 08/26/2021   Procedure: INCISIONAL HERNIA REPAIR;  Surgeon: Georganna Skeans, MD;  Location: [place];  Service: General;  Laterality: N/A;   INSERTION OF MESH N/A 08/26/2021   Procedure: INSERTION OF MESH;  Surgeon: Georganna Skeans, MD;  Location: [place];  Service: General;  Laterality: N/A;   LAPAROTOMY N/A 07/29/2020   Procedure: EXPLORATORY LAPAROTOMY, REPAIR OF VENTRAL HERNIA;  Surgeon: Georganna Skeans, MD;  Location: [place];  Service: General;  Laterality: N/A;   MYOMECTOMY      OB History   No obstetric history on file.      Home Medications    Prior to Admission medications   Medication Sig Start Date End Date Taking? Authorizing Provider  cetirizine (ZYRTEC) 10 MG tablet Take 1 tablet (10 mg total) by mouth daily. 07/19/22  Yes Kenidee Cregan-Warren, Alda Lea, NP  predniSONE (DELTASONE) 20 MG tablet Take 1 tablet (20 mg total) by mouth daily with breakfast for 5 days. 07/19/22 07/24/22 Yes Markell Sciascia-Warren, Alda Lea, NP  albuterol (VENTOLIN HFA) 108 (90 Base) MCG/ACT inhaler INHALE 1-2 PUFFS INTO THE LUNGS UP TO EVERY 6HRS AS NEEDED FOR WHEEZING/SHORTNESS OF BREATH 06/19/22   Eugenia Pancoast, FNP  benzonatate (TESSALON)  100 MG capsule Take 1 capsule (100 mg total) by mouth every 8 (eight) hours. Do not take with alcohol or while driving or operating heavy machinery.  May cause drowsiness. 04/28/22   LampteyMyrene Galas, MD  ibuprofen (ADVIL) 200 MG tablet Take 200 mg by mouth every 4 (four) hours as needed for headache.    [provider]  losartan-hydrochlorothiazide (HYZAAR) 50-12.5 MG tablet TAKE 1 TABLET BY MOUTH EVERY DAY 03/20/22   Eugenia Pancoast, FNP  meclizine (ANTIVERT) 25 MG tablet Take 1/2 to 1 tablet po qd prn vertigo 01/17/22   Eugenia Pancoast, FNP  methocarbamol (ROBAXIN) 500 MG tablet Take 1 tablet (500 mg total) by mouth  every 8 (eight) hours as needed for muscle spasms. 01/17/22   Eugenia Pancoast, FNP  methocarbamol (ROBAXIN) 500 MG tablet Take 1 tablet (500 mg total) by mouth 2 (two) times daily. 07/14/22   Montine Circle, PA-C  promethazine-dextromethorphan (PROMETHAZINE-DM) 6.25-15 MG/5ML syrup Take 5 mLs by mouth 4 (four) times daily as needed. 02/09/22   Volney American, PA-C  traMADol (ULTRAM) 50 MG tablet Take 1 tablet (50 mg total) by mouth every 6 (six) hours as needed. 09/23/21 09/23/22  Georganna Skeans, MD    Family History Family History  Problem Relation Age of Onset   Hypertension Mother    Stroke Mother    Dementia Mother    Heart disease Mother    Hyperlipidemia Mother    COPD Father    Rheum arthritis Father    Mental illness Father    Heart disease Maternal Grandmother    Heart disease Maternal Grandfather    Alzheimer's disease Maternal Grandfather    Uterine cancer Paternal Grandmother     Social History Social History   Tobacco Use   Smoking status: Never   Smokeless tobacco: Never  Vaping Use   Vaping Use: Never used  Substance Use Topics   Alcohol use: No   Drug use: No     Allergies   Codeine, Latex, Penicillins, and Sulfa drugs cross reactors   Review of Systems Review of Systems Per HPI  Physical Exam Triage Vital Signs ED Triage Vitals  Enc Vitals Group     BP 07/19/22 0815 (!) 168/90     Pulse Rate 07/19/22 0815 74     Resp 07/19/22 0815 17     Temp 07/19/22 0815 98.1 F (36.7 C)     Temp Source 07/19/22 0815 Oral     SpO2 07/19/22 0815 96 %     Weight --      Height --      Head Circumference --      Peak Flow --      Pain Score 07/19/22 0819 5     Pain Loc --      Pain Edu? --      Excl. in [place]? --    No data found.  Updated Vital Signs BP (!) 168/90 (BP Location: Right Arm)   Pulse 74   Temp 98.1 F (36.7 C) (Oral)   Resp 17   SpO2 96%   Visual Acuity Right Eye Distance:   Left Eye Distance:   Bilateral Distance:    Right  Eye Near:   Left Eye Near:    Bilateral Near:     Physical Exam Vitals and nursing note reviewed.  Constitutional:      General: She is not in acute distress.    Appearance: Normal appearance. She is well-developed.  HENT:  Head: Normocephalic.     Right Ear: Ear canal and external ear normal. A middle ear effusion is present.     Left Ear: Ear canal and external ear normal. A middle ear effusion is present.     Nose: Congestion present.     Mouth/Throat:     Lips: Pink.     Mouth: Mucous membranes are moist.     Pharynx: Uvula midline. Posterior oropharyngeal erythema present. No pharyngeal swelling, oropharyngeal exudate or uvula swelling.     Comments: Cobblestoning present on posterior oropharynx Eyes:     Extraocular Movements: Extraocular movements intact.     Pupils: Pupils are equal, round, and reactive to light.  Cardiovascular:     Rate and Rhythm: Normal rate and regular rhythm.     Pulses: Normal pulses.     Heart sounds: Normal heart sounds.  Pulmonary:     Effort: Pulmonary effort is normal.     Breath sounds: Normal breath sounds.  Abdominal:     General: Bowel sounds are normal. There is no distension.     Palpations: Abdomen is soft.     Tenderness: There is no abdominal tenderness. There is no guarding or rebound.  Genitourinary:    Vagina: Normal. No vaginal discharge.  Musculoskeletal:     Cervical back: Normal range of motion.  Lymphadenopathy:     Cervical: No cervical adenopathy.  Skin:    General: Skin is warm and dry.     Findings: No erythema or rash.  Neurological:     General: No focal deficit present.     Mental Status: She is alert and oriented to person, place, and time.     Cranial Nerves: No cranial nerve deficit.  Psychiatric:        Mood and Affect: Mood normal.        Behavior: Behavior normal.      UC Treatments / Results  Labs (all labs ordered are listed, but only abnormal results are displayed) Labs Reviewed - No data  to display  EKG   Radiology No results found.  Procedures Procedures (including critical care time)  Medications Ordered in UC Medications - No data to display  Initial Impression / Assessment and Plan / UC Course  I have reviewed the triage vital signs and the nursing notes.  Pertinent labs & imaging results that were available during my care of the patient were reviewed by me and considered in my medical decision making (see chart for details).  The patient is well-appearing, she is in no acute distress, she is hypertensive, but vital signs are otherwise stable.  Patient with bilateral middle ear effusions, effusion is worse on the right side.  Symptoms are consistent with a eustachian tube dysfunction.  Will treat with prednisone 20 mg for the next 5 days cetirizine 10 mg.  Patient was advised to continue use of her Flonase.  Supportive care recommendations were provided to the patient.  Patient is in agreement with this plan of care and verbalizes understanding.  All questions were answered.  Patient stable for discharge.   Final Clinical Impressions(s) / UC Diagnoses   Final diagnoses:  Acute dysfunction of Eustachian tube, bilateral     Discharge Instructions      Take medication as prescribed. May take Tylenol as needed for pain, fever, general discomfort.  Warm compresses to the affected ear help with comfort. Do not stick anything inside the ear while symptoms persist. Avoid getting water inside of the ear while symptoms  persist. As discussed, please follow-up with your primary care physician for recheck of your hemoglobin A1c.  The medication I am prescribing today may elevate your blood glucose level.  If you develop increased thirst, increased hunger, or increased urination, please follow-up in this clinic or with your primary care physician for further evaluation.  Also you will need to stop the medication at that time. Follow-up as needed.     ED  Prescriptions     Medication Sig Dispense Auth. Provider   predniSONE (DELTASONE) 20 MG tablet Take 1 tablet (20 mg total) by mouth daily with breakfast for 5 days. 5 tablet Kamaria Lucia-Warren, Alda Lea, NP   cetirizine (ZYRTEC) 10 MG tablet Take 1 tablet (10 mg total) by mouth daily. 30 tablet Jenny Omdahl-Warren, Alda Lea, NP      PDMP not reviewed this encounter.   Tish Men, NP 07/19/22 [phone]

## 2022-07-19 NOTE — ED Triage Notes (Signed)
We seen in ED on 3/1 for neck pain.  States she is having right ear pain over the past few days.  Has taking advil and muscle relaxer this morning for pain.  States hurts to move head to right side.  States also has some nasal congestion x a few weeks.

## 2022-07-19 NOTE — Discharge Instructions (Addendum)
Take medication as prescribed. May take Tylenol as needed for pain, fever, general discomfort.  Warm compresses to the affected ear help with comfort. Do not stick anything inside the ear while symptoms persist. Avoid getting water inside of the ear while symptoms persist. As discussed, please follow-up with your primary care physician for recheck of your hemoglobin A1c.  The medication I am prescribing today may elevate your blood glucose level.  If you develop increased thirst, increased hunger, or increased urination, please follow-up in this clinic or with your primary care physician for further evaluation.  Also you will need to stop the medication at that time. Follow-up as needed.

## 2022-07-26 ENCOUNTER — Ambulatory Visit
Admission: RE | Admit: 2022-07-26 | Discharge: 2022-07-26 | Disposition: A | Discharge status: Home / Self Care | Payer: 59 | Source: Ambulatory Visit | Attending: Surgery | Admitting: Surgery

## 2022-07-26 DIAGNOSIS — K432 Incisional hernia without obstruction or gangrene: Secondary | ICD-10-CM

## 2022-07-26 DIAGNOSIS — K439 Ventral hernia without obstruction or gangrene: Secondary | ICD-10-CM | POA: Diagnosis not present

## 2022-07-26 DIAGNOSIS — Z90710 Acquired absence of both cervix and uterus: Secondary | ICD-10-CM | POA: Diagnosis not present

## 2022-07-26 DIAGNOSIS — R160 Hepatomegaly, not elsewhere classified: Secondary | ICD-10-CM | POA: Diagnosis not present

## 2022-07-26 DIAGNOSIS — K760 Fatty (change of) liver, not elsewhere classified: Secondary | ICD-10-CM | POA: Diagnosis not present

## 2022-07-26 MED ORDER — IOPAMIDOL (ISOVUE-300) INJECTION 61%
100.0000 mL | Freq: Once | INTRAVENOUS | Status: AC | PRN
  Administered 2022-07-26: 100 mL via INTRAVENOUS

## 2022-08-18 DIAGNOSIS — K432 Incisional hernia without obstruction or gangrene: Secondary | ICD-10-CM | POA: Diagnosis not present

## 2022-09-15 ENCOUNTER — Encounter: Payer: Self-pay | Admitting: Internal Medicine

## 2022-09-15 ENCOUNTER — Telehealth: Payer: Self-pay | Admitting: Family

## 2022-09-15 ENCOUNTER — Ambulatory Visit (INDEPENDENT_AMBULATORY_CARE_PROVIDER_SITE_OTHER): Payer: 59 | Admitting: Internal Medicine

## 2022-09-15 VITALS — BP 126/86 | HR 79 | Temp 97.5°F | Ht 65.0 in | Wt 253.0 lb

## 2022-09-15 DIAGNOSIS — E119 Type 2 diabetes mellitus without complications: Secondary | ICD-10-CM

## 2022-09-15 DIAGNOSIS — R739 Hyperglycemia, unspecified: Secondary | ICD-10-CM

## 2022-09-15 HISTORY — DX: Type 2 diabetes mellitus without complications: E11.9

## 2022-09-15 LAB — CBC
HCT: 41.8 % (ref 36.0–46.0)
Hemoglobin: 13.7 g/dL (ref 12.0–15.0)
MCHC: 32.7 g/dL (ref 30.0–36.0)
MCV: 85.5 fl (ref 78.0–100.0)
Platelets: 229 10*3/uL (ref 150.0–400.0)
RBC: 4.89 Mil/uL (ref 3.87–5.11)
RDW: 14.3 % (ref 11.5–15.5)
WBC: 10.6 10*3/uL — ABNORMAL HIGH (ref 4.0–10.5)

## 2022-09-15 LAB — GLUCOSE, POCT (MANUAL RESULT ENTRY): POC Glucose: 260 mg/dl — AB (ref 70–99)

## 2022-09-15 LAB — COMPREHENSIVE METABOLIC PANEL
ALT: 26 U/L (ref 0–35)
AST: 21 U/L (ref 0–37)
Albumin: 4 g/dL (ref 3.5–5.2)
Alkaline Phosphatase: 82 U/L (ref 39–117)
BUN: 12 mg/dL (ref 6–23)
CO2: 31 mEq/L (ref 19–32)
Calcium: 9.8 mg/dL (ref 8.4–10.5)
Chloride: 98 mEq/L (ref 96–112)
Creatinine, Ser: 0.64 mg/dL (ref 0.40–1.20)
GFR: 98.5 mL/min (ref >60.00)
Glucose, Bld: 202 mg/dL — ABNORMAL HIGH (ref 70–99)
Potassium: 3.5 mEq/L (ref 3.5–5.1)
Sodium: 139 mEq/L (ref 135–145)
Total Bilirubin: 0.3 mg/dL (ref 0.2–1.2)
Total Protein: 7.3 g/dL (ref 6.0–8.3)

## 2022-09-15 LAB — LIPID PANEL
Cholesterol: 169 mg/dL (ref 0–200)
HDL: 41.5 mg/dL (ref >39.00)
NonHDL: 127.7
Total CHOL/HDL Ratio: 4
Triglycerides: 201 mg/dL — ABNORMAL HIGH (ref 0.0–149.0)
VLDL: 40.2 mg/dL — ABNORMAL HIGH (ref 0.0–40.0)

## 2022-09-15 LAB — HEMOGLOBIN A1C: Hgb A1c MFr Bld: 9.8 % — ABNORMAL HIGH (ref 4.6–6.5)

## 2022-09-15 LAB — LDL CHOLESTEROL, DIRECT: Direct LDL: 109 mg/dL

## 2022-09-15 MED ORDER — METFORMIN HCL ER 500 MG PO TB24: 1000.0000 mg | ORAL_TABLET | Freq: Every day | ORAL | 3 refills | Status: DC

## 2022-09-15 NOTE — Progress Notes (Addendum)
Subjective:    Patient ID: Kaitlyn Bray, female    DOB: 1965-09-01, 57 y.o.   MRN: [id number]  HPI Here due to elevated blood sugar  Has felt okay but was just curious [employer] machine--- 314 and then 422 today Has eaten a fair bit of bread today--then muffins with cheese (sugary) Last night had [place] food with noodles Still drinks sweet tea  Knew she was prediabetic and A1c up--but was not "taking care of myself"  No chest pain No SOB  Current Outpatient Medications on File Prior to Visit  Medication Sig Dispense Refill   albuterol (VENTOLIN HFA) 108 (90 Base) MCG/ACT inhaler INHALE 1-2 PUFFS INTO THE LUNGS UP TO EVERY 6HRS AS NEEDED FOR WHEEZING/SHORTNESS OF BREATH 8.5 each 1   benzonatate (TESSALON) 100 MG capsule Take 1 capsule (100 mg total) by mouth every 8 (eight) hours. Do not take with alcohol or while driving or operating heavy machinery.  May cause drowsiness. 21 capsule 0   cetirizine (ZYRTEC) 10 MG tablet Take 1 tablet (10 mg total) by mouth daily. 30 tablet 0   ibuprofen (ADVIL) 200 MG tablet Take 200 mg by mouth every 4 (four) hours as needed for headache.     losartan-hydrochlorothiazide (HYZAAR) 50-12.5 MG tablet TAKE 1 TABLET BY MOUTH EVERY DAY 90 tablet 1   meclizine (ANTIVERT) 25 MG tablet Take 1/2 to 1 tablet po qd prn vertigo 20 tablet 0   methocarbamol (ROBAXIN) 500 MG tablet Take 1 tablet (500 mg total) by mouth every 8 (eight) hours as needed for muscle spasms. 20 tablet 0   methocarbamol (ROBAXIN) 500 MG tablet Take 1 tablet (500 mg total) by mouth 2 (two) times daily. 20 tablet 0   traMADol (ULTRAM) 50 MG tablet Take 1 tablet (50 mg total) by mouth every 6 (six) hours as needed. (Patient not taking: Reported on 09/15/2022) 20 tablet 0   No current facility-administered medications on file prior to visit.    Allergies  Allergen Reactions   Codeine Other (See Comments)    Fast heart rate   Latex Other (See Comments)    Redness from bandaids    Penicillins Rash   Sulfa Drugs Cross Reactors Rash    Past Medical History:  Diagnosis Date   Asthma    Complication of anesthesia    Heart murmur    Hernia    Hypertension    PONV (postoperative nausea and vomiting)    Pre-diabetes    S/P hernia repair 08/26/2021   SBO (small bowel obstruction) ([facility]) 09/15/2021    Past Surgical History:  Procedure Laterality Date   ABDOMINAL HYSTERECTOMY     one ovary stlil in place. removed uterus cervix and pt thinks fallopian tubes   APPENDECTOMY     CESAREAN SECTION     INCISIONAL HERNIA REPAIR N/A 08/26/2021   Procedure: INCISIONAL HERNIA REPAIR;  Surgeon: [name], MD;  Location: [facility] OR;  Service: General;  Laterality: N/A;   INSERTION OF MESH N/A 08/26/2021   Procedure: INSERTION OF MESH;  Surgeon: [name], MD;  Location: [facility] OR;  Service: General;  Laterality: N/A;   LAPAROTOMY N/A 07/29/2020   Procedure: EXPLORATORY LAPAROTOMY, REPAIR OF VENTRAL HERNIA;  Surgeon: [name], MD;  Location: [facility] OR;  Service: General;  Laterality: N/A;   MYOMECTOMY      Family History  Problem Relation Age of Onset   Hypertension Mother    Stroke Mother    Dementia Mother    Heart disease Mother  Hyperlipidemia Mother    COPD Father    Rheum arthritis Father    Mental illness Father    Heart disease Maternal Grandmother    Heart disease Maternal Grandfather    Alzheimer's disease Maternal Grandfather    Uterine cancer Paternal Grandmother     Social History   Socioeconomic History   Marital status: Married    Spouse name: Not on file   Number of children: 1   Years of education: Not on file   Highest education level: Not on file  Occupational History   Occupation: daycare    Comment: [employer]  Tobacco Use   Smoking status: Never   Smokeless tobacco: Never  Vaping Use   Vaping Use: Never used  Substance and Sexual Activity   Alcohol use: No   Drug use: No   Sexual activity: Not Currently    Partners:  Male    Birth control/protection: Surgical  Other Topics Concern   Not on file  Social History Narrative   One 58 y/o boy    Lives with husband and son    Cats outside   Social Determinants of Health   Financial Resource Strain: Not on file  Food Insecurity: Not on file  Transportation Needs: Not on file  Physical Activity: Not on file  Stress: Not on file  Social Connections: Not on file  Intimate Partner Violence: Not on file   Review of Systems As gained a lot of weight recently---has been drinking soda and sweet tea more lately Lots of nocturia lately Has noted dry mouth in the night    Objective:   Physical Exam Constitutional:      Appearance: Normal appearance.  Cardiovascular:     Rate and Rhythm: Normal rate and regular rhythm.     Heart sounds:     No gallop.     Comments: Gr 2/6 systolic murmur Pulmonary:     Effort: Pulmonary effort is normal.     Breath sounds: Normal breath sounds. No wheezing or rales.  Abdominal:     Palpations: Abdomen is soft.     Tenderness: There is no abdominal tenderness.     Comments: Large ventral hernia  Musculoskeletal:     Cervical back: Neck supple.     Right lower leg: No edema.     Left lower leg: No edema.  Lymphadenopathy:     Cervical: No cervical adenopathy.  Neurological:     Mental Status: She is alert.            Assessment & Plan:

## 2022-09-15 NOTE — Telephone Encounter (Signed)
Per appt notes pt already has appt scheduled with Dr Alphonsus Sias 09/15/22 at 11:45. Sending note to Dr Alphonsus Sias and Alphonsus Sias pool.

## 2022-09-15 NOTE — Assessment & Plan Note (Addendum)
Fortunately down to 260 Remarkable level of denial, etc Not dehydrated which is surprising--but allow [place] to start treatment without ER visit Money may be a factor--will start with metformin 500 ER daily, then increase to 2 daily Consider GLP-1 agonist soon  Counseled about drinking lots of water now Stop all sugared drinks Avoid fast food Diabetic counseling Early follow up with [place]

## 2022-09-15 NOTE — Telephone Encounter (Signed)
FYI: This call has been transferred to Access Nurse. Once the result note has been entered staff can address the message at that time.  Patient called in with the following symptoms:  Red Word: Blood Sugar Patient called in and stated that her blood sugar was 422 and she was feeling sweaty.   Please advise at Mobile [phone] (mobile)  Message is routed to Provider Pool and [facility] Triage

## 2022-09-15 NOTE — Telephone Encounter (Signed)
I don't see a history of diabetes. If we confirm this hyperglycemia, she will need ER for IV fluids and initiation of diabetes Rx

## 2022-09-15 NOTE — Patient Instructions (Signed)
Stop all sugared drinks!!!! Cut back on bread---and switch to Keto bread if you can afford it. No more fast food and avoid desserts and sweets Let us know if you have any stomach/intestinal issues with the metformin---start with one with breakfast for 3 days, then increase to two daily  DASH Eating Plan DASH stands for Dietary Approaches to Stop Hypertension. The DASH eating plan is a healthy eating plan that has been shown to: Reduce high blood pressure (hypertension). Reduce your risk for type 2 diabetes, heart disease, and stroke. Help with weight loss. What are tips for following this plan? Reading food labels Check food labels for the amount of salt (sodium) per serving. Choose foods with less than 5 percent of the Daily Value of sodium. Generally, foods with less than 300 milligrams (mg) of sodium per serving fit into this eating plan. To find whole grains, look for the word "whole" as the first word in the ingredient list. Shopping Buy products labeled as "low-sodium" or "no salt added." Buy fresh foods. Avoid canned foods and pre-made or frozen meals. Cooking Avoid adding salt when cooking. Use salt-free seasonings or herbs instead of table salt or sea salt. Check with your health care provider or pharmacist before using salt substitutes. Do not fry foods. Cook foods using healthy methods such as baking, boiling, grilling, roasting, and broiling instead. Cook with heart-healthy oils, such as olive, canola, avocado, soybean, or sunflower oil. Meal planning  Eat a balanced diet that includes: 4 or more servings of fruits and 4 or more servings of vegetables each day. Try to fill one-half of your plate with fruits and vegetables. 6-8 servings of whole grains each day. Less than 6 oz (170 g) of lean meat, poultry, or fish each day. A 3-oz (85-g) serving of meat is about the same size as a deck of cards. One egg equals 1 oz (28 g). 2-3 servings of low-fat dairy each day. One serving is  1 cup (237 mL). 1 serving of nuts, seeds, or beans 5 times each week. 2-3 servings of heart-healthy fats. Healthy fats called omega-3 fatty acids are found in foods such as walnuts, flaxseeds, fortified milks, and eggs. These fats are also found in cold-water fish, such as sardines, salmon, and mackerel. Limit how much you eat of: Canned or prepackaged foods. Food that is high in trans fat, such as some fried foods. Food that is high in saturated fat, such as fatty meat. Desserts and other sweets, sugary drinks, and other foods with added sugar. Full-fat dairy products. Do not salt foods before eating. Do not eat more than 4 egg yolks a week. Try to eat at least 2 vegetarian meals a week. Eat more home-cooked food and less restaurant, buffet, and fast food. Lifestyle When eating at a restaurant, ask that your food be prepared with less salt or no salt, if possible. If you drink alcohol: Limit how much you use to: 0-1 drink a day for women who are not pregnant. 0-2 drinks a day for men. Be aware of how much alcohol is in your drink. In the [place] one drink equals one 12 oz bottle of beer (355 mL), one 5 oz glass of wine (148 mL), or one 1 oz glass of hard liquor (44 mL). General information Avoid eating more than 2,300 mg of salt a day. If you have hypertension, you may need to reduce your sodium intake to 1,500 mg a day. Work with your health care provider to maintain a healthy  body weight or to lose weight. Ask what an ideal weight is for you. Get at least 30 minutes of exercise that causes your heart to beat faster (aerobic exercise) most days of the week. Activities may include walking, swimming, or biking. Work with your health care provider or dietitian to adjust your eating plan to your individual calorie needs. What foods should I eat? Fruits All fresh, dried, or frozen fruit. Canned fruit in natural juice (without added sugar). Vegetables Fresh or frozen vegetables (raw, steamed,  roasted, or grilled). Low-sodium or reduced-sodium tomato and vegetable juice. Low-sodium or reduced-sodium tomato sauce and tomato paste. Low-sodium or reduced-sodium canned vegetables. Grains Whole-grain or whole-wheat bread. Whole-grain or whole-wheat pasta. Brown rice. [place]. Bulgur. Whole-grain and low-sodium cereals. Pita bread. Low-fat, low-sodium crackers. Whole-wheat flour tortillas. Meats and other proteins Skinless chicken or [place]. Ground chicken or [place]. Pork with fat trimmed off. Fish and seafood. Egg whites. Dried beans, peas, or lentils. Unsalted nuts, nut butters, and seeds. Unsalted canned beans. Lean cuts of beef with fat trimmed off. Low-sodium, lean precooked or cured meat, such as sausages or meat loaves. Dairy Low-fat (1%) or fat-free (skim) milk. Reduced-fat, low-fat, or fat-free cheeses. Nonfat, low-sodium ricotta or cottage cheese. Low-fat or nonfat yogurt. Low-fat, low-sodium cheese. Fats and oils Soft margarine without trans fats. Vegetable oil. Reduced-fat, low-fat, or light mayonnaise and salad dressings (reduced-sodium). Canola, safflower, olive, avocado, soybean, and sunflower oils. Avocado. Seasonings and condiments Herbs. Spices. Seasoning mixes without salt. Other foods Unsalted popcorn and pretzels. Fat-free sweets. The items listed above may not be a complete list of foods and beverages you can eat. Contact a dietitian for more information. What foods should I avoid? Fruits Canned fruit in a light or heavy syrup. Fried fruit. Fruit in cream or butter sauce. Vegetables Creamed or fried vegetables. Vegetables in a cheese sauce. Regular canned vegetables (not low-sodium or reduced-sodium). Regular canned tomato sauce and paste (not low-sodium or reduced-sodium). Regular tomato and vegetable juice (not low-sodium or reduced-sodium). Angie Fava. Olives. Grains Baked goods made with fat, such as croissants, muffins, or some breads. Dry pasta or rice meal  packs. Meats and other proteins Fatty cuts of meat. Ribs. Fried meat. Berniece Salines. Bologna, salami, and other precooked or cured meats, such as sausages or meat loaves. Fat from the back of a pig (fatback). Bratwurst. Salted nuts and seeds. Canned beans with added salt. Canned or smoked fish. Whole eggs or egg yolks. Chicken or [place] with skin. Dairy Whole or 2% milk, cream, and half-and-half. Whole or full-fat cream cheese. Whole-fat or sweetened yogurt. Full-fat cheese. Nondairy creamers. Whipped toppings. Processed cheese and cheese spreads. Fats and oils Butter. Stick margarine. Lard. Shortening. Ghee. Bacon fat. Tropical oils, such as coconut, palm kernel, or palm oil. Seasonings and condiments Onion salt, garlic salt, seasoned salt, table salt, and sea salt. Worcestershire sauce. Tartar sauce. Barbecue sauce. Teriyaki sauce. Soy sauce, including reduced-sodium. Steak sauce. Canned and packaged gravies. Fish sauce. Oyster sauce. Cocktail sauce. Store-bought horseradish. Ketchup. Mustard. Meat flavorings and tenderizers. Bouillon cubes. Hot sauces. Pre-made or packaged marinades. Pre-made or packaged taco seasonings. Relishes. Regular salad dressings. Other foods Salted popcorn and pretzels. The items listed above may not be a complete list of foods and beverages you should avoid. Contact a dietitian for more information. Where to find more information National Heart, Lung, and Blood Institute: https://wilson-eaton.com/ American Heart Association: www.heart.org Academy of Nutrition and Dietetics: www.eatright.[place]: www.kidney.org Summary The DASH eating plan is a healthy eating  plan that has been shown to reduce high blood pressure (hypertension). It may also reduce your risk for type 2 diabetes, heart disease, and stroke. When on the DASH eating plan, aim to eat more fresh fruits and vegetables, whole grains, lean proteins, low-fat dairy, and heart-healthy fats. With the DASH  eating plan, you should limit salt (sodium) intake to 2,300 mg a day. If you have hypertension, you may need to reduce your sodium intake to 1,500 mg a day. Work with your health care provider or dietitian to adjust your eating plan to your individual calorie needs. This information is not intended to replace advice given to you by your health care provider. Make sure you discuss any questions you have with your health care provider. Document Revised: 04/04/2019 Document Reviewed: 04/04/2019 Elsevier Patient Education  [place].

## 2022-09-15 NOTE — Telephone Encounter (Signed)
Patient Name: WRENN STOURS ASON Gender: Female DOB: April 20, 1966 Age: 57 Y 10 M 2 D Return Phone Number: [phone] (Primary), [phone] (Secondary) Address: City/ State/ Zip: [place]  [address] Client [facility] - Client Client Site [facility] - Day Provider AA - PHYSICIAN, NOT LISTED- MD Contact Type Call Who Is Calling Patient / Member / Family / Caregiver Call Type Triage / Clinical Relationship To Patient Self Return Phone Number [phone] (Primary) Chief Complaint Blood Sugar High Reason for Call Symptomatic / Request for Health Information Initial Comment Caller states her blood sugar is 422 and she is sweating. Translation No   Nurse Assessment Nurse: Lexine Baton, RN, Belenda Cruise Date/Time (Eastern Time): 09/15/2022 10:20:36 AM Confirm and document reason for call. If symptomatic, describe symptoms. ---Caller states that she is having a blood sugar reading of 422, has been having increased urination. Does the patient have any new or worsening symptoms? ---Yes Will a triage be completed? ---Yes Related visit to physician within the last 2 weeks? ---No Does the PT have any chronic conditions? (i.e. diabetes, asthma, this includes High risk factors for pregnancy, etc.) ---Yes List chronic conditions. ---HTN Is this a behavioral health or substance abuse call? ---No  Guidelines Guideline Title Affirmed Question Affirmed Notes Nurse Date/Time (Eastern Time) Diabetes - High Blood Sugar Blood glucose > 400 mg/dL (25.3 mmol/L) Donadeo, RN, Belenda Cruise 09/15/2022 10:22:58 AM Disp. Time Lamount Cohen Time) Disposition Final User 09/15/2022 10:25:45 AM Call PCP Now Yes Lexine Baton, RN, Belenda Cruise Final Disposition 09/15/2022 10:25:45 AM Call PCP Now Yes Lexine Baton, RN, Belenda Cruise PLEASE NOTE: All timestamps contained within this report are represented as [place] Standard Time. CONFIDENTIALTY NOTICE: This fax transmission is intended only  for the addressee. It contains information that is legally privileged, confidential or otherwise protected from use or disclosure. If you are not the intended recipient, you are strictly prohibited from reviewing, disclosing, copying using or disseminating any of this information or taking any action in reliance on or regarding this information. If you have received this fax in error, please notify us immediately by telephone so that we can arrange for its return to [place]. Phone: [phone], Toll-Free: [phone], Fax: [phone] Page: 2 of 2 Call Id: [id number] Caller Disagree/Comply Comply Caller Understands Yes PreDisposition Call Doctor Care Advice Given Per Guideline CALL PCP NOW: * You need to discuss this with your doctor (or NP/PA). CALL BACK IF: * Vomiting occurs * Rapid breathing occurs * You become worse CARE ADVICE given per Diabetes - High Blood Sugar (Adult) guideline. Comments User: Bartholomew Crews, RN Date/Time Lamount Cohen Time): 09/15/2022 10:26:11 AM Caller states that she has an appointment at 1145. Referrals REFERRED TO PCP OFFICE

## 2022-09-18 NOTE — Progress Notes (Signed)
Will d/w pt at visit.  Thank you for seeing her Dr. Alphonsus Sias.

## 2022-09-20 ENCOUNTER — Other Ambulatory Visit: Payer: Self-pay | Admitting: Family

## 2022-09-21 ENCOUNTER — Telehealth: Payer: Self-pay

## 2022-09-21 NOTE — Telephone Encounter (Signed)
Patient called in because she was advised to start taking Metformin 2 tabs at breakfast. She looked on google and said it was advised not to take 2 at a time. She has not started taking 2 yet, but was concerned if she should be taking this amount. I advised to go ahead and take as prescribed. Informed that she may experience some diarrhea at first, but if she can't tolerate then to call back. She will request to take one in the morning and one at night possibly.

## 2022-10-03 ENCOUNTER — Telehealth: Payer: Self-pay | Admitting: Family

## 2022-10-03 DIAGNOSIS — E119 Type 2 diabetes mellitus without complications: Secondary | ICD-10-CM

## 2022-10-03 MED ORDER — GLUCOSE BLOOD VI STRP
ORAL_STRIP | 12 refills | Status: DC
Start: 2022-10-03 — End: 2022-11-24

## 2022-10-03 NOTE — Telephone Encounter (Signed)
Patient called in and was wanting to know if Tabitha could send a prescription in for one touch test strips. She stated that she usually buy them OTC for almost $20 but she believes her insurance will cover them. She would the prescription to be sent over to [id number] [place], [place] - [address]. Thank you!

## 2022-10-06 NOTE — Progress Notes (Signed)
Sent message, via epic in basket, requesting orders in epic from surgeon.  

## 2022-10-10 ENCOUNTER — Encounter: Payer: Self-pay | Admitting: Family

## 2022-10-10 ENCOUNTER — Ambulatory Visit (INDEPENDENT_AMBULATORY_CARE_PROVIDER_SITE_OTHER): Payer: 59 | Admitting: Family

## 2022-10-10 VITALS — BP 128/78 | HR 90 | Temp 97.8°F | Ht 65.0 in | Wt 251.8 lb

## 2022-10-10 DIAGNOSIS — Z7984 Long term (current) use of oral hypoglycemic drugs: Secondary | ICD-10-CM

## 2022-10-10 DIAGNOSIS — E119 Type 2 diabetes mellitus without complications: Secondary | ICD-10-CM

## 2022-10-10 DIAGNOSIS — K432 Incisional hernia without obstruction or gangrene: Secondary | ICD-10-CM | POA: Diagnosis not present

## 2022-10-10 DIAGNOSIS — I10 Essential (primary) hypertension: Secondary | ICD-10-CM

## 2022-10-10 LAB — MICROALBUMIN / CREATININE URINE RATIO
Creatinine,U: 83.8 mg/dL
Microalb Creat Ratio: 0.8 mg/g (ref 0.0–30.0)
Microalb, Ur: <0.7 mg/dL (ref 0.0–1.9)

## 2022-10-10 MED ORDER — METFORMIN HCL ER 500 MG PO TB24: 500.0000 mg | ORAL_TABLET | Freq: Two times a day (BID) | ORAL | 3 refills | Status: DC

## 2022-10-10 NOTE — Assessment & Plan Note (Signed)
Pending surgery 

## 2022-10-10 NOTE — Assessment & Plan Note (Signed)
Fasting glucose logs are encouraging Checking fructosamine today. Pending results.  Increase metformin XR to 1000 mg twice daily (pt does not want to take QD) Continue to work on diabetic diet.  Pt pending surgery in two weeks, did advise pt to let surgeon known recent A1C

## 2022-10-10 NOTE — Progress Notes (Signed)
Established Patient Office Visit  Subjective:      CC:  Chief Complaint  Patient presents with   Medical Management of Chronic Issues    HPI: Kaitlyn Bray is a 57 y.o. female presenting on 10/10/2022 for Medical Management of Chronic Issues . DM2: started on metformin last visit with Dr. Alphonsus Sias. Tolerating metformin pretty well, only slight GI upset. Referred to diabetic nutritionist, she has an appt July 2024 Lab Results  Component Value Date   HGBA1C 9.8 (H) 09/15/2022  States that her fasting glucose is around 120-130  HLD: triglycerides mildly elevated. Does need better control LDL 109 goal < 70  Lab Results  Component Value Date   CHOL 169 09/15/2022   HDL 41.50 09/15/2022   LDLCALC 95 08/26/2020   LDLDIRECT 109.0 09/15/2022   TRIG 201.0 (H) 09/15/2022   CHOLHDL 4 09/15/2022   Vitamin B12 def: taking b12 daily otc    Wt Readings from Last 3 Encounters:  10/10/22 251 lb 12.8 oz (114.2 kg)  09/15/22 253 lb (114.8 kg)  01/17/22 246 lb 2 oz (111.6 kg)       Social history:  Relevant past medical, surgical, family and social history reviewed and updated as indicated. Interim medical history since our last visit reviewed.  Allergies and medications reviewed and updated.  DATA REVIEWED: CHART IN EPIC     ROS: Negative unless specifically indicated above in HPI.    Current Outpatient Medications:    cholecalciferol (VITAMIN D3) 25 MCG (1000 UNIT) tablet, Take 1,000 Units by mouth daily., Disp: , Rfl:    glucose blood test strip, Use as instructed, Disp: 100 each, Rfl: 12   ibuprofen (ADVIL) 200 MG tablet, Take 200 mg by mouth every 4 (four) hours as needed for headache., Disp: , Rfl:    losartan-hydrochlorothiazide (HYZAAR) 50-12.5 MG tablet, TAKE 1 TABLET BY MOUTH EVERY DAY, Disp: 90 tablet, Rfl: 3   metFORMIN (GLUCOPHAGE-XR) 500 MG 24 hr tablet, Take 1 tablet (500 mg total) by mouth 2 (two) times daily. Take two tablets twice daily, Disp: 360 tablet,  Rfl: 3      Objective:    BP 128/78 (BP Location: Left Arm)   Pulse 90   Temp 97.8 F (36.6 C) (Temporal)   Ht 5\' 5"  (1.651 m)   Wt 251 lb 12.8 oz (114.2 kg)   SpO2 98%   BMI 41.90 kg/m   Wt Readings from Last 3 Encounters:  10/10/22 251 lb 12.8 oz (114.2 kg)  09/15/22 253 lb (114.8 kg)  01/17/22 246 lb 2 oz (111.6 kg)    Physical Exam Constitutional:      General: She is not in acute distress.    Appearance: Normal appearance. She is obese. She is not ill-appearing, toxic-appearing or diaphoretic.  HENT:     Head: Normocephalic.  Cardiovascular:     Rate and Rhythm: Normal rate and regular rhythm.  Pulmonary:     Effort: Pulmonary effort is normal.  Musculoskeletal:        General: Normal range of motion.  Neurological:     General: No focal deficit present.     Mental Status: She is alert and oriented to person, place, and time. Mental status is at baseline.  Psychiatric:        Mood and Affect: Mood normal.        Behavior: Behavior normal.        Thought Content: Thought content normal.        Judgment: Judgment  normal.             Assessment & Plan:  New onset type 2 diabetes mellitus (HCC) Assessment & Plan: Fasting glucose logs are encouraging Checking fructosamine today. Pending results.  Increase metformin XR to 1000 mg twice daily (pt does not want to take QD) Continue to work on diabetic diet.  Pt pending surgery in two weeks, did advise pt to let surgeon known recent A1C  Orders: -     metFORMIN HCl ER; Take 1 tablet (500 mg total) by mouth 2 (two) times daily. Take two tablets twice daily  Dispense: 360 tablet; Refill: 3 -     Fructosamine -     Microalbumin / creatinine urine ratio  Primary hypertension Assessment & Plan: Stable.    Recurrent ventral hernia Assessment & Plan: Pending surgery.      Return in about 3 months (around 01/10/2023) for f/u diabetes.  Mort Sawyers, MSN, APRN, FNP-C [facility]  Medicine

## 2022-10-10 NOTE — Assessment & Plan Note (Signed)
Stable

## 2022-10-11 ENCOUNTER — Encounter (HOSPITAL_COMMUNITY): Payer: Self-pay

## 2022-10-11 ENCOUNTER — Ambulatory Visit: Payer: Self-pay | Admitting: Surgery

## 2022-10-12 LAB — FRUCTOSAMINE: Fructosamine: 239 umol/L (ref 205–285)

## 2022-10-13 NOTE — Progress Notes (Signed)
COVID Vaccine Completed: yes  Date of COVID positive in last 90 days:  PCP - Mort Sawyers, FNP Cardiologist -   Chest x-ray -  EKG -  Stress Test -  ECHO - 07/30/20 Epic Cardiac Cath -  Pacemaker/ICD device last checked: Spinal Cord Stimulator:  Bowel Prep -   Sleep Study -  CPAP -   Fasting Blood Sugar -  Checks Blood Sugar _____ times a day  Last dose of GLP1 agonist-  N/A GLP1 instructions:  N/A   Last dose of SGLT-2 inhibitors-  N/A SGLT-2 instructions: N/A   Blood Thinner Instructions:  Time Aspirin Instructions: Last Dose:  Activity level:  Can go up a flight of stairs and perform activities of daily living without stopping and without symptoms of chest pain or shortness of breath.  Able to exercise without symptoms  Unable to go up a flight of stairs without symptoms of     Anesthesia review: HTN, aortic atherosclerosis, DM2, asthma, heart murmur  Patient denies shortness of breath, fever, cough and chest pain at PAT appointment  Patient verbalized understanding of instructions that were given to them at the PAT appointment. Patient was also instructed that they will need to review over the PAT instructions again at home before surgery.

## 2022-10-16 ENCOUNTER — Encounter (HOSPITAL_COMMUNITY)
Admission: RE | Admit: 2022-10-16 | Discharge: 2022-10-16 | Disposition: A | Discharge status: Home / Self Care | Payer: 59 | Source: Ambulatory Visit | Attending: Surgery | Admitting: Surgery

## 2022-10-16 ENCOUNTER — Other Ambulatory Visit: Payer: Self-pay

## 2022-10-16 ENCOUNTER — Encounter (HOSPITAL_COMMUNITY): Payer: Self-pay

## 2022-10-16 VITALS — BP 142/80 | HR 82 | Temp 98.3°F | Resp 14 | Ht 65.0 in | Wt 275.0 lb

## 2022-10-16 DIAGNOSIS — K432 Incisional hernia without obstruction or gangrene: Secondary | ICD-10-CM | POA: Diagnosis not present

## 2022-10-16 DIAGNOSIS — E119 Type 2 diabetes mellitus without complications: Secondary | ICD-10-CM | POA: Insufficient documentation

## 2022-10-16 DIAGNOSIS — I10 Essential (primary) hypertension: Secondary | ICD-10-CM | POA: Insufficient documentation

## 2022-10-16 DIAGNOSIS — Z01818 Encounter for other preprocedural examination: Secondary | ICD-10-CM | POA: Diagnosis not present

## 2022-10-16 LAB — CBC
HCT: 40.4 % (ref 36.0–46.0)
Hemoglobin: 13 g/dL (ref 12.0–15.0)
MCH: 28.3 pg (ref 26.0–34.0)
MCHC: 32.2 g/dL (ref 30.0–36.0)
MCV: 87.8 fL (ref 80.0–100.0)
Platelets: 253 10*3/uL (ref 150–400)
RBC: 4.6 MIL/uL (ref 3.87–5.11)
RDW: 13.7 % (ref 11.5–15.5)
WBC: 11.1 10*3/uL — ABNORMAL HIGH (ref 4.0–10.5)
nRBC: 0 % (ref 0.0–0.2)

## 2022-10-16 LAB — BASIC METABOLIC PANEL
Anion gap: 10 (ref 5–15)
BUN: 17 mg/dL (ref 6–20)
CO2: 27 mmol/L (ref 22–32)
Calcium: 9.2 mg/dL (ref 8.9–10.3)
Chloride: 103 mmol/L (ref 98–111)
Creatinine, Ser: 0.72 mg/dL (ref 0.44–1.00)
GFR, Estimated: >60 mL/min (ref >60)
Glucose, Bld: 99 mg/dL (ref 70–99)
Potassium: 3.7 mmol/L (ref 3.5–5.1)
Sodium: 140 mmol/L (ref 135–145)

## 2022-10-16 LAB — GLUCOSE, CAPILLARY: Glucose-Capillary: 103 mg/dL — ABNORMAL HIGH (ref 70–99)

## 2022-10-16 NOTE — Patient Instructions (Addendum)
SURGICAL WAITING ROOM VISITATION  Patients having surgery or a procedure may have no more than 2 support people in the waiting area - these visitors may rotate.    Children under the age of 24 must have an adult with them who is not the patient.  Due to an increase in RSV and influenza rates and associated hospitalizations, children ages 54 and under may not visit patients in [facility] hospitals.  If the patient needs to stay at the hospital during part of their recovery, the visitor guidelines for inpatient rooms apply. Pre-op nurse will coordinate an appropriate time for 1 support person to accompany patient in pre-op.  This support person may not rotate.    Please refer to the [facility] website for the visitor guidelines for Inpatients (after your surgery is over and you are in a regular room).    Your procedure is scheduled on: 10/23/22   Report to [facility] Main Entrance    Report to admitting at 5:15 AM   Call this number if you have problems the morning of surgery [phone]   Do not eat food :After Midnight.   After Midnight you may have the following liquids until 4:30 AM DAY OF SURGERY  Water Non-Citrus Juices (without pulp, NO RED-Apple, White grape, White cranberry) Black Coffee (NO MILK/CREAM OR CREAMERS, sugar ok)  Clear Tea (NO MILK/CREAM OR CREAMERS, sugar ok) regular and decaf                             Plain Jell-O (NO RED)                                           Fruit ices (not with fruit pulp, NO RED)                                     Popsicles (NO RED)                                                               Sports drinks like Gatorade (NO RED)                      If you have questions, please contact your surgeon's office.   FOLLOW BOWEL PREP AND ANY ADDITIONAL PRE OP INSTRUCTIONS YOU RECEIVED FROM YOUR SURGEON'S OFFICE!!!     Oral Hygiene is also important to reduce your risk of infection.                                     Remember - BRUSH YOUR TEETH THE MORNING OF SURGERY WITH YOUR REGULAR TOOTHPASTE  DENTURES WILL BE REMOVED PRIOR TO SURGERY PLEASE DO NOT APPLY "Poly grip" OR ADHESIVES!!!   Do NOT smoke after Midnight   Take these medicines the morning of surgery with A SIP OF WATER: Albuterol, Meclizine   DO NOT TAKE ANY ORAL DIABETIC MEDICATIONS DAY OF YOUR SURGERY  How to Manage Your  Diabetes Before and After Surgery  Why is it important to control my blood sugar before and after surgery? Improving blood sugar levels before and after surgery helps healing and can limit problems. A way of improving blood sugar control is eating a healthy diet by:  Eating less sugar and carbohydrates  Increasing activity/exercise  Talking with your doctor about reaching your blood sugar goals High blood sugars (greater than 180 mg/dL) can raise your risk of infections and slow your recovery, so you will need to focus on controlling your diabetes during the weeks before surgery. Make sure that the doctor who takes care of your diabetes knows about your planned surgery including the date and location.  How do I manage my blood sugar before surgery? Check your blood sugar at least 4 times a day, starting 2 days before surgery, to make sure that the level is not too high or low. Check your blood sugar the morning of your surgery when you wake up and every 2 hours until you get to the Short Stay unit. If your blood sugar is less than 70 mg/dL, you will need to treat for low blood sugar: Do not take insulin. Treat a low blood sugar (less than 70 mg/dL) with  cup of clear juice (cranberry or apple), 4 glucose tablets, OR glucose gel. Recheck blood sugar in 15 minutes after treatment (to make sure it is greater than 70 mg/dL). If your blood sugar is not greater than 70 mg/dL on recheck, call [phone] for further instructions. Report your blood sugar to the short stay nurse when you get to Short Stay.  If you are  admitted to the hospital after surgery: Your blood sugar will be checked by the staff and you will probably be given insulin after surgery (instead of oral diabetes medicines) to make sure you have good blood sugar levels. The goal for blood sugar control after surgery is 80-180 mg/dL.   WHAT DO I DO ABOUT MY DIABETES MEDICATION?  Do not take oral diabetes medicines (pills) the morning of surgery.  THE DAY BEFORE SURGERY, take Metformin as prescribed.       THE MORNING OF SURGERY, do not take Metformin.   Reviewed and Endorsed by [facility] Patient Education Committee, August 2015  Bring CPAP mask and tubing day of surgery.                              You may not have any metal on your body including hair pins, jewelry, and body piercing             Do not wear make-up, lotions, powders, perfumes, or deodorant  Do not wear nail polish including gel and S&S, artificial/acrylic nails, or any other type of covering on natural nails including finger and toenails. If you have artificial nails, gel coating, etc. that needs to be removed by a nail salon please have this removed prior to surgery or surgery may need to be canceled/ delayed if the surgeon/ anesthesia feels like they are unable to be safely monitored.   Do not shave  48 hours prior to surgery.    Do not bring valuables to the hospital.  IS NOT             RESPONSIBLE   FOR VALUABLES.   Contacts, glasses, dentures or bridgework may not be worn into surgery.   Bring small overnight bag day of surgery.   DO  NOT BRING YOUR HOME MEDICATIONS TO THE HOSPITAL. PHARMACY WILL DISPENSE MEDICATIONS LISTED ON YOUR MEDICATION LIST TO YOU DURING YOUR ADMISSION IN THE HOSPITAL!              Please read over the following fact sheets you were given: IF YOU HAVE QUESTIONS ABOUT YOUR PRE-OP INSTRUCTIONS PLEASE CALL [phone] [facility]ras    If you received a COVID test during your pre-op visit  it is requested that you wear a mask  when out in public, stay away from anyone that may not be feeling well and notify your surgeon if you develop symptoms. If you test positive for Covid or have been in contact with anyone that has tested positive in the last 10 days please notify you surgeon.  [facility] - Preparing for Surgery Before surgery, you can play an important role.  Because skin is not sterile, your skin needs to be as free of germs as possible.  You can reduce the number of germs on your skin by washing with CHG (chlorahexidine gluconate) soap before surgery.  CHG is an antiseptic cleaner which kills germs and bonds with the skin to continue killing germs even after washing. Please DO NOT use if you have an allergy to CHG or antibacterial soaps.  If your skin becomes reddened/irritated stop using the CHG and inform your nurse when you arrive at Short Stay. Do not shave (including legs and underarms) for at least 48 hours prior to the first CHG shower.  You may shave your face/neck.  Please follow these instructions carefully:  1.  Shower with CHG Soap the night before surgery and the  morning of surgery.  2.  If you choose to wash your hair, wash your hair first as usual with your normal  shampoo.  3.  After you shampoo, rinse your hair and body thoroughly to remove the shampoo.                             4.  Use CHG as you would any other liquid soap.  You can apply chg directly to the skin and wash.  Gently with a scrungie or clean washcloth.  5.  Apply the CHG Soap to your body ONLY FROM THE NECK DOWN.   Do   not use on face/ open                           Wound or open sores. Avoid contact with eyes, ears mouth and   genitals (private parts).                       Wash face,  Genitals (private parts) with your normal soap.             6.  Wash thoroughly, paying special attention to the area where your    surgery  will be performed.  7.  Thoroughly rinse your body with warm water from the neck down.  8.  DO NOT  shower/wash with your normal soap after using and rinsing off the CHG Soap.                9.  Pat yourself dry with a clean towel.            10.  Wear clean pajamas.            11.  Place clean sheets  on your bed the night of your first shower and do not  sleep with pets. Day of Surgery : Do not apply any lotions/deodorants the morning of surgery.  Please wear clean clothes to the hospital/surgery center.  FAILURE TO FOLLOW THESE INSTRUCTIONS MAY RESULT IN THE CANCELLATION OF YOUR SURGERY  PATIENT SIGNATURE_________________________________  NURSE SIGNATURE__________________________________  ________________________________________________________________________

## 2022-10-17 LAB — HEMOGLOBIN A1C
Hgb A1c MFr Bld: 8.5 % — ABNORMAL HIGH (ref 4.8–5.6)
Mean Plasma Glucose: 197 mg/dL

## 2022-10-17 NOTE — Progress Notes (Signed)
A1C 8.5 routed to Dr. Dossie Der

## 2022-10-17 NOTE — Progress Notes (Signed)
Anesthesia Chart Review   Case: [id number] Date/Time: 10/23/22 0715   Procedure: ROBOTIC, POSSIBLY OPEN, RECURRENT INCISIONAL HERNIA REPAIR WITH MESH   Anesthesia type: General   Pre-op diagnosis: RECURRENT VENTRAL HERNIA   Location: [facility] ROOM 02 / [facility]   Surgeons: [name], MD       DISCUSSION:57 y.o. never smoker with h/o PONV, HTN, DM II, recurrent ventral hernia scheduled for above procedure 10/23/2022 with Dr. Ivar Drape.   Pt last seen by PCP 10/10/2022. Metformin increased at this visit. A1C 8.5 down from 9.3 on 09/15/2022. Labs forwarded to Dr. Dossie Der.   Echo 07/30/2020 with EF 60-65%, mild AV sclerosis without stenosis.  VS: BP (!) 142/80   Pulse 82   Temp 36.8 C (Oral)   Resp 14   Ht 5\' 5"  (1.651 m)   Wt 124.7 kg   SpO2 97%   BMI 45.76 kg/m   PROVIDERS: Mort Sawyers, FNP is PCP    LABS: Labs reviewed: Acceptable for surgery. and labs forwarded to surgeon  (all labs ordered are listed, but only abnormal results are displayed)  Labs Reviewed  HEMOGLOBIN A1C - Abnormal; Notable for the following components:      Result Value   Hgb A1c MFr Bld 8.5 (*)    All other components within normal limits  CBC - Abnormal; Notable for the following components:   WBC 11.1 (*)    All other components within normal limits  GLUCOSE, CAPILLARY - Abnormal; Notable for the following components:   Glucose-Capillary 103 (*)    All other components within normal limits  BASIC METABOLIC PANEL     IMAGES:   EKG:   CV: Echo 07/30/2020 1. Left ventricular ejection fraction, by estimation, is 60 to 65%. The  left ventricle has normal function. Left ventricular endocardial border  not optimally defined to evaluate regional wall motion. Left ventricular  diastolic parameters were normal.   2. Right ventricular systolic function is normal. The right ventricular  size is mildly enlarged. Tricuspid regurgitation signal is inadequate for  assessing PA  pressure.   3. The mitral valve was not well visualized. No evidence of mitral valve  regurgitation.   4. The aortic valve was not well visualized. Aortic valve regurgitation  is not visualized. Mild aortic valve sclerosis is present, with no  evidence of aortic valve stenosis.   5. The inferior vena cava is normal in size with greater than 50%  respiratory variability, suggesting right atrial pressure of 3 mmHg.  Past Medical History:  Diagnosis Date   Asthma    Complication of anesthesia    Heart murmur    Hernia    Hypertension    New onset type 2 diabetes mellitus (HCC) 09/15/2022   PONV (postoperative nausea and vomiting)    Pre-diabetes    S/P hernia repair 08/26/2021   SBO (small bowel obstruction) (HCC) 09/15/2021    Past Surgical History:  Procedure Laterality Date   ABDOMINAL HYSTERECTOMY     one ovary stlil in place. removed uterus cervix and pt thinks fallopian tubes   APPENDECTOMY     CESAREAN SECTION     INCISIONAL HERNIA REPAIR N/A 08/26/2021   Procedure: INCISIONAL HERNIA REPAIR;  Surgeon: [name], MD;  Location: [facility] OR;  Service: General;  Laterality: N/A;   INSERTION OF MESH N/A 08/26/2021   Procedure: INSERTION OF MESH;  Surgeon: [name], MD;  Location: [facility] OR;  Service: General;  Laterality: N/A;   LAPAROTOMY N/A 07/29/2020  Procedure: EXPLORATORY LAPAROTOMY, REPAIR OF VENTRAL HERNIA;  Surgeon: [name], MD;  Location: [facility] OR;  Service: General;  Laterality: N/A;   MYOMECTOMY      MEDICATIONS:  albuterol (VENTOLIN HFA) 108 (90 Base) MCG/ACT inhaler   Ascorbic Acid (VITAMIN C PO)   Cyanocobalamin (B-12 PO)   glucose blood test strip   ibuprofen (ADVIL) 200 MG tablet   losartan-hydrochlorothiazide (HYZAAR) 50-12.5 MG tablet   meclizine (ANTIVERT) 25 MG tablet   metFORMIN (GLUCOPHAGE-XR) 500 MG 24 hr tablet   Multiple Vitamins-Minerals (ZINC PO)   VITAMIN D PO   No current facility-administered medications for this encounter.      Jodell Cipro Ward, PA-C [facility] Pre-Surgical Testing [phone]

## 2022-10-22 NOTE — Anesthesia Preprocedure Evaluation (Addendum)
Anesthesia Evaluation  Patient identified by MRN, date of birth, ID band Patient awake    Reviewed: Allergy & Precautions, NPO status , Patient's Chart, lab work & pertinent test results  History of Anesthesia Complications (+) PONV and history of anesthetic complications  Airway Mallampati: III  TM Distance: >3 FB Neck ROM: Full    Dental  (+) Teeth Intact, Dental Advisory Given, Poor Dentition, Missing,    Pulmonary asthma    Pulmonary exam normal        Cardiovascular hypertension, Pt. on medications Normal cardiovascular exam   Echo 07/30/20: EF 60-65%, normal diastolic function, normal RVSF, mild AV sclerosis w/o stenosis   Neuro/Psych negative neurological ROS     GI/Hepatic negative GI ROS, Neg liver ROS,,,  Endo/Other  diabetes  Morbid obesity  Renal/GU negative Renal ROS  negative genitourinary   Musculoskeletal negative musculoskeletal ROS (+)    Abdominal   Peds  Hematology negative hematology ROS (+)   Anesthesia Other Findings   Reproductive/Obstetrics                             Anesthesia Physical Anesthesia Plan  ASA: 3  Anesthesia Plan: General   Post-op Pain Management: Tylenol PO (pre-op)*, Toradol IV (intra-op)*, Dilaudid IV and Ketamine IV*   Induction: Intravenous  PONV Risk Score and Plan: 4 or greater and Ondansetron, Dexamethasone, Treatment may vary due to age or medical condition, Midazolam and Scopolamine patch - Pre-op  Airway Management Planned: Oral ETT  Additional Equipment: None  Intra-op Plan:   Post-operative Plan: Extubation in OR  Informed Consent: I have reviewed the patients History and Physical, chart, labs and discussed the procedure including the risks, benefits and alternatives for the proposed anesthesia with the patient or authorized representative who has indicated his/her understanding and acceptance.     Dental advisory  given  Plan Discussed with: Anesthesiologist and CRNA  Anesthesia Plan Comments: (DISCUSSION:57 y.o. never smoker with h/o PONV, HTN, DM II, recurrent ventral hernia scheduled for above procedure 10/23/2022 with Dr. Ivar Drape.    Pt last seen by PCP 10/10/2022. Metformin increased at this visit. A1C 8.5 down from 9.3 on 09/15/2022. Labs forwarded to Dr. Dossie Der.    Echo 07/30/2020 with EF 60-65%, mild AV sclerosis without stenosis.  )        Anesthesia Quick Evaluation

## 2022-10-23 ENCOUNTER — Encounter (HOSPITAL_COMMUNITY): Payer: Self-pay | Admitting: Surgery

## 2022-10-23 ENCOUNTER — Inpatient Hospital Stay (HOSPITAL_COMMUNITY)
Admission: RE | Admit: 2022-10-23 | Discharge: 2022-11-16 | DRG: 329 | Disposition: A | Discharge status: Rehabilitation Facility | Payer: 59 | Attending: Surgery | Admitting: Surgery

## 2022-10-23 ENCOUNTER — Other Ambulatory Visit: Payer: Self-pay

## 2022-10-23 ENCOUNTER — Inpatient Hospital Stay (HOSPITAL_COMMUNITY): Payer: 59 | Admitting: Anesthesiology

## 2022-10-23 ENCOUNTER — Inpatient Hospital Stay (HOSPITAL_COMMUNITY): Payer: 59 | Admitting: Physician Assistant

## 2022-10-23 ENCOUNTER — Encounter (HOSPITAL_COMMUNITY)
Admission: RE | Disposition: A | Discharge status: Rehabilitation Facility | Payer: Self-pay | Source: Home / Self Care | Attending: Surgery

## 2022-10-23 DIAGNOSIS — Z91040 Latex allergy status: Secondary | ICD-10-CM

## 2022-10-23 DIAGNOSIS — J45909 Unspecified asthma, uncomplicated: Secondary | ICD-10-CM

## 2022-10-23 DIAGNOSIS — K660 Peritoneal adhesions (postprocedural) (postinfection): Secondary | ICD-10-CM | POA: Diagnosis not present

## 2022-10-23 DIAGNOSIS — F05 Delirium due to known physiological condition: Secondary | ICD-10-CM | POA: Diagnosis not present

## 2022-10-23 DIAGNOSIS — Z7984 Long term (current) use of oral hypoglycemic drugs: Secondary | ICD-10-CM | POA: Diagnosis not present

## 2022-10-23 DIAGNOSIS — Z882 Allergy status to sulfonamides status: Secondary | ICD-10-CM

## 2022-10-23 DIAGNOSIS — J9600 Acute respiratory failure, unspecified whether with hypoxia or hypercapnia: Secondary | ICD-10-CM | POA: Diagnosis not present

## 2022-10-23 DIAGNOSIS — Z9911 Dependence on respirator [ventilator] status: Secondary | ICD-10-CM | POA: Diagnosis not present

## 2022-10-23 DIAGNOSIS — G8918 Other acute postprocedural pain: Secondary | ICD-10-CM

## 2022-10-23 DIAGNOSIS — S36498A Other injury of other part of small intestine, initial encounter: Secondary | ICD-10-CM | POA: Diagnosis not present

## 2022-10-23 DIAGNOSIS — J9811 Atelectasis: Secondary | ICD-10-CM | POA: Diagnosis not present

## 2022-10-23 DIAGNOSIS — R739 Hyperglycemia, unspecified: Secondary | ICD-10-CM | POA: Diagnosis not present

## 2022-10-23 DIAGNOSIS — S31609A Unspecified open wound of abdominal wall, unspecified quadrant with penetration into peritoneal cavity, initial encounter: Secondary | ICD-10-CM | POA: Diagnosis not present

## 2022-10-23 DIAGNOSIS — Z83438 Family history of other disorder of lipoprotein metabolism and other lipidemia: Secondary | ICD-10-CM

## 2022-10-23 DIAGNOSIS — Z794 Long term (current) use of insulin: Secondary | ICD-10-CM | POA: Diagnosis not present

## 2022-10-23 DIAGNOSIS — R188 Other ascites: Secondary | ICD-10-CM | POA: Diagnosis not present

## 2022-10-23 DIAGNOSIS — S36409A Unspecified injury of unspecified part of small intestine, initial encounter: Secondary | ICD-10-CM | POA: Diagnosis not present

## 2022-10-23 DIAGNOSIS — Y838 Other surgical procedures as the cause of abnormal reaction of the patient, or of later complication, without mention of misadventure at the time of the procedure: Secondary | ICD-10-CM | POA: Diagnosis not present

## 2022-10-23 DIAGNOSIS — K658 Other peritonitis: Secondary | ICD-10-CM | POA: Diagnosis not present

## 2022-10-23 DIAGNOSIS — Z880 Allergy status to penicillin: Secondary | ICD-10-CM

## 2022-10-23 DIAGNOSIS — E1165 Type 2 diabetes mellitus with hyperglycemia: Secondary | ICD-10-CM | POA: Diagnosis present

## 2022-10-23 DIAGNOSIS — E119 Type 2 diabetes mellitus without complications: Secondary | ICD-10-CM

## 2022-10-23 DIAGNOSIS — K567 Ileus, unspecified: Secondary | ICD-10-CM | POA: Diagnosis not present

## 2022-10-23 DIAGNOSIS — Z820 Family history of epilepsy and other diseases of the nervous system: Secondary | ICD-10-CM

## 2022-10-23 DIAGNOSIS — Z818 Family history of other mental and behavioral disorders: Secondary | ICD-10-CM

## 2022-10-23 DIAGNOSIS — E870 Hyperosmolality and hypernatremia: Secondary | ICD-10-CM | POA: Diagnosis not present

## 2022-10-23 DIAGNOSIS — Z90710 Acquired absence of both cervix and uterus: Secondary | ICD-10-CM

## 2022-10-23 DIAGNOSIS — A408 Other streptococcal sepsis: Secondary | ICD-10-CM | POA: Diagnosis not present

## 2022-10-23 DIAGNOSIS — R5381 Other malaise: Secondary | ICD-10-CM

## 2022-10-23 DIAGNOSIS — E875 Hyperkalemia: Secondary | ICD-10-CM | POA: Diagnosis not present

## 2022-10-23 DIAGNOSIS — Z8049 Family history of malignant neoplasm of other genital organs: Secondary | ICD-10-CM

## 2022-10-23 DIAGNOSIS — K9189 Other postprocedural complications and disorders of digestive system: Secondary | ICD-10-CM | POA: Diagnosis not present

## 2022-10-23 DIAGNOSIS — T8149XA Infection following a procedure, other surgical site, initial encounter: Secondary | ICD-10-CM | POA: Diagnosis not present

## 2022-10-23 DIAGNOSIS — R6521 Severe sepsis with septic shock: Secondary | ICD-10-CM | POA: Diagnosis not present

## 2022-10-23 DIAGNOSIS — R159 Full incontinence of feces: Secondary | ICD-10-CM | POA: Diagnosis not present

## 2022-10-23 DIAGNOSIS — R1310 Dysphagia, unspecified: Secondary | ICD-10-CM | POA: Diagnosis not present

## 2022-10-23 DIAGNOSIS — T8143XA Infection following a procedure, organ and space surgical site, initial encounter: Secondary | ICD-10-CM | POA: Diagnosis not present

## 2022-10-23 DIAGNOSIS — I10 Essential (primary) hypertension: Secondary | ICD-10-CM | POA: Diagnosis present

## 2022-10-23 DIAGNOSIS — E871 Hypo-osmolality and hyponatremia: Secondary | ICD-10-CM | POA: Diagnosis not present

## 2022-10-23 DIAGNOSIS — Z8249 Family history of ischemic heart disease and other diseases of the circulatory system: Secondary | ICD-10-CM

## 2022-10-23 DIAGNOSIS — Z8261 Family history of arthritis: Secondary | ICD-10-CM

## 2022-10-23 DIAGNOSIS — E785 Hyperlipidemia, unspecified: Secondary | ICD-10-CM | POA: Diagnosis not present

## 2022-10-23 DIAGNOSIS — M2550 Pain in unspecified joint: Secondary | ICD-10-CM | POA: Diagnosis present

## 2022-10-23 DIAGNOSIS — B377 Candidal sepsis: Secondary | ICD-10-CM | POA: Diagnosis not present

## 2022-10-23 DIAGNOSIS — R509 Fever, unspecified: Secondary | ICD-10-CM | POA: Diagnosis not present

## 2022-10-23 DIAGNOSIS — Z825 Family history of asthma and other chronic lower respiratory diseases: Secondary | ICD-10-CM

## 2022-10-23 DIAGNOSIS — K651 Peritoneal abscess: Secondary | ICD-10-CM | POA: Diagnosis not present

## 2022-10-23 DIAGNOSIS — H8110 Benign paroxysmal vertigo, unspecified ear: Secondary | ICD-10-CM | POA: Diagnosis present

## 2022-10-23 DIAGNOSIS — E440 Moderate protein-calorie malnutrition: Secondary | ICD-10-CM | POA: Insufficient documentation

## 2022-10-23 DIAGNOSIS — Z79899 Other long term (current) drug therapy: Secondary | ICD-10-CM

## 2022-10-23 DIAGNOSIS — Z4659 Encounter for fitting and adjustment of other gastrointestinal appliance and device: Secondary | ICD-10-CM | POA: Diagnosis not present

## 2022-10-23 DIAGNOSIS — D6489 Other specified anemias: Secondary | ICD-10-CM | POA: Diagnosis not present

## 2022-10-23 DIAGNOSIS — Z6841 Body Mass Index (BMI) 40.0 and over, adult: Secondary | ICD-10-CM

## 2022-10-23 DIAGNOSIS — D75839 Thrombocytosis, unspecified: Secondary | ICD-10-CM | POA: Diagnosis not present

## 2022-10-23 DIAGNOSIS — K659 Peritonitis, unspecified: Secondary | ICD-10-CM | POA: Diagnosis present

## 2022-10-23 DIAGNOSIS — S31605A Unspecified open wound of abdominal wall, periumbilic region with penetration into peritoneal cavity, initial encounter: Secondary | ICD-10-CM | POA: Diagnosis not present

## 2022-10-23 DIAGNOSIS — K432 Incisional hernia without obstruction or gangrene: Secondary | ICD-10-CM | POA: Diagnosis not present

## 2022-10-23 DIAGNOSIS — K631 Perforation of intestine (nontraumatic): Secondary | ICD-10-CM

## 2022-10-23 DIAGNOSIS — T8140XA Infection following a procedure, unspecified, initial encounter: Secondary | ICD-10-CM | POA: Diagnosis not present

## 2022-10-23 DIAGNOSIS — K5730 Diverticulosis of large intestine without perforation or abscess without bleeding: Secondary | ICD-10-CM | POA: Diagnosis not present

## 2022-10-23 DIAGNOSIS — D649 Anemia, unspecified: Secondary | ICD-10-CM | POA: Diagnosis not present

## 2022-10-23 DIAGNOSIS — Z4682 Encounter for fitting and adjustment of non-vascular catheter: Secondary | ICD-10-CM | POA: Diagnosis not present

## 2022-10-23 DIAGNOSIS — Z885 Allergy status to narcotic agent status: Secondary | ICD-10-CM

## 2022-10-23 DIAGNOSIS — Z823 Family history of stroke: Secondary | ICD-10-CM

## 2022-10-23 DIAGNOSIS — K760 Fatty (change of) liver, not elsewhere classified: Secondary | ICD-10-CM | POA: Diagnosis not present

## 2022-10-23 DIAGNOSIS — Z978 Presence of other specified devices: Secondary | ICD-10-CM

## 2022-10-23 HISTORY — PX: XI ROBOTIC ASSISTED VENTRAL HERNIA: SHX6789

## 2022-10-23 LAB — GLUCOSE, CAPILLARY
Glucose-Capillary: 99 mg/dL (ref 70–99)
Glucose-Capillary: 162 mg/dL — ABNORMAL HIGH (ref 70–99)
Glucose-Capillary: 137 mg/dL — ABNORMAL HIGH (ref 70–99)

## 2022-10-23 LAB — CBC
HCT: 42 % (ref 36.0–46.0)
Hemoglobin: 13 g/dL (ref 12.0–15.0)
MCH: 28.4 pg (ref 26.0–34.0)
MCHC: 31 g/dL (ref 30.0–36.0)
MCV: 91.7 fL (ref 80.0–100.0)
Platelets: 256 10*3/uL (ref 150–400)
RBC: 4.58 MIL/uL (ref 3.87–5.11)
RDW: 13.9 % (ref 11.5–15.5)
WBC: 19.6 10*3/uL — ABNORMAL HIGH (ref 4.0–10.5)
nRBC: 0 % (ref 0.0–0.2)

## 2022-10-23 LAB — CREATININE, SERUM
Creatinine, Ser: 0.83 mg/dL (ref 0.44–1.00)
GFR, Estimated: >60 mL/min (ref >60)

## 2022-10-23 SURGERY — REPAIR, HERNIA, VENTRAL, ROBOT-ASSISTED
Anesthesia: General | Site: Abdomen

## 2022-10-23 MED ORDER — KETOROLAC TROMETHAMINE 15 MG/ML IJ SOLN
15.0000 mg | Freq: Three times a day (TID) | INTRAMUSCULAR | Status: AC
  Administered 2022-10-23 – 2022-10-28 (×15): 15 mg via INTRAVENOUS
  Filled 2022-10-23 (×15): qty 1

## 2022-10-23 MED ORDER — DEXAMETHASONE SODIUM PHOSPHATE 10 MG/ML IJ SOLN
INTRAMUSCULAR | Status: AC
  Filled 2022-10-23: qty 1

## 2022-10-23 MED ORDER — 0.9 % SODIUM CHLORIDE (POUR BTL) OPTIME
TOPICAL | Status: DC | PRN
  Administered 2022-10-23: 1000 mL

## 2022-10-23 MED ORDER — GABAPENTIN 300 MG PO CAPS
300.0000 mg | ORAL_CAPSULE | ORAL | Status: AC
  Administered 2022-10-23: 300 mg via ORAL
  Filled 2022-10-23: qty 1

## 2022-10-23 MED ORDER — ONDANSETRON HCL 4 MG/2ML IJ SOLN
4.0000 mg | Freq: Four times a day (QID) | INTRAMUSCULAR | Status: DC | PRN
  Administered 2022-10-30: 4 mg via INTRAVENOUS
  Filled 2022-10-23: qty 2

## 2022-10-23 MED ORDER — ACETAMINOPHEN 160 MG/5ML PO SOLN: 325.0000 mg | ORAL | Status: DC | PRN

## 2022-10-23 MED ORDER — ROCURONIUM BROMIDE 10 MG/ML (PF) SYRINGE
PREFILLED_SYRINGE | INTRAVENOUS | Status: AC
  Filled 2022-10-23: qty 10

## 2022-10-23 MED ORDER — LIDOCAINE HCL 2 % IJ SOLN
INTRAMUSCULAR | Status: AC
  Filled 2022-10-23: qty 20

## 2022-10-23 MED ORDER — HYDROMORPHONE HCL 1 MG/ML IJ SOLN
1.0000 mg | INTRAMUSCULAR | Status: DC | PRN
  Administered 2022-10-25: 1 mg via INTRAVENOUS
  Filled 2022-10-23 (×2): qty 1

## 2022-10-23 MED ORDER — BUPIVACAINE LIPOSOME 1.3 % IJ SUSP
INTRAMUSCULAR | Status: AC
  Filled 2022-10-23: qty 20

## 2022-10-23 MED ORDER — ACETAMINOPHEN 500 MG PO TABS
1000.0000 mg | ORAL_TABLET | ORAL | Status: DC
Start: 2022-10-23 — End: 2022-10-23

## 2022-10-23 MED ORDER — ACETAMINOPHEN 325 MG PO TABS: 325.0000 mg | ORAL_TABLET | ORAL | Status: DC | PRN

## 2022-10-23 MED ORDER — MIDAZOLAM HCL 2 MG/2ML IJ SOLN
INTRAMUSCULAR | Status: AC
  Filled 2022-10-23: qty 2

## 2022-10-23 MED ORDER — ONDANSETRON HCL 4 MG/2ML IJ SOLN
INTRAMUSCULAR | Status: AC
  Filled 2022-10-23: qty 2

## 2022-10-23 MED ORDER — CHLORHEXIDINE GLUCONATE CLOTH 2 % EX PADS: 6.0000 | MEDICATED_PAD | Freq: Once | CUTANEOUS | Status: DC

## 2022-10-23 MED ORDER — PROPOFOL 10 MG/ML IV BOLUS
INTRAVENOUS | Status: AC
  Filled 2022-10-23: qty 20

## 2022-10-23 MED ORDER — HYDROMORPHONE HCL 1 MG/ML IJ SOLN
INTRAMUSCULAR | Status: AC
  Administered 2022-10-23: 0.5 mg via INTRAVENOUS
  Filled 2022-10-23: qty 2

## 2022-10-23 MED ORDER — FENTANYL CITRATE (PF) 250 MCG/5ML IJ SOLN
INTRAMUSCULAR | Status: AC
  Filled 2022-10-23: qty 5

## 2022-10-23 MED ORDER — PROPOFOL 10 MG/ML IV BOLUS
INTRAVENOUS | Status: DC | PRN
  Administered 2022-10-23: 170 mg via INTRAVENOUS

## 2022-10-23 MED ORDER — ONDANSETRON HCL 4 MG/2ML IJ SOLN
4.0000 mg | Freq: Once | INTRAMUSCULAR | Status: AC | PRN
  Administered 2022-10-23: 4 mg via INTRAVENOUS

## 2022-10-23 MED ORDER — FENTANYL CITRATE (PF) 100 MCG/2ML IJ SOLN
INTRAMUSCULAR | Status: AC
  Filled 2022-10-23: qty 2

## 2022-10-23 MED ORDER — ENOXAPARIN SODIUM 40 MG/0.4ML IJ SOSY
40.0000 mg | PREFILLED_SYRINGE | Freq: Once | INTRAMUSCULAR | Status: AC
  Administered 2022-10-23: 40 mg via SUBCUTANEOUS
  Filled 2022-10-23: qty 0.4

## 2022-10-23 MED ORDER — KETAMINE HCL 50 MG/5ML IJ SOSY
PREFILLED_SYRINGE | INTRAMUSCULAR | Status: AC
  Filled 2022-10-23: qty 5

## 2022-10-23 MED ORDER — MIDAZOLAM HCL 5 MG/5ML IJ SOLN
INTRAMUSCULAR | Status: DC | PRN
  Administered 2022-10-23: 2 mg via INTRAVENOUS

## 2022-10-23 MED ORDER — ACETAMINOPHEN 325 MG PO TABS
650.0000 mg | ORAL_TABLET | Freq: Four times a day (QID) | ORAL | Status: DC
  Administered 2022-10-23 – 2022-10-26 (×11): 650 mg via ORAL
  Filled 2022-10-23 (×11): qty 2

## 2022-10-23 MED ORDER — CEFAZOLIN SODIUM-DEXTROSE 2-4 GM/100ML-% IV SOLN
INTRAVENOUS | Status: AC
  Filled 2022-10-23: qty 100

## 2022-10-23 MED ORDER — MEPERIDINE HCL 50 MG/ML IJ SOLN: 6.2500 mg | INTRAMUSCULAR | Status: DC | PRN

## 2022-10-23 MED ORDER — KETAMINE HCL 10 MG/ML IJ SOLN
INTRAMUSCULAR | Status: DC | PRN
  Administered 2022-10-23: 10 mg via INTRAVENOUS
  Administered 2022-10-23 (×2): 20 mg via INTRAVENOUS

## 2022-10-23 MED ORDER — ONDANSETRON HCL 4 MG/2ML IJ SOLN
INTRAMUSCULAR | Status: DC | PRN
  Administered 2022-10-23: 4 mg via INTRAVENOUS

## 2022-10-23 MED ORDER — SUGAMMADEX SODIUM 200 MG/2ML IV SOLN
INTRAVENOUS | Status: DC | PRN
  Administered 2022-10-23: 500 mg via INTRAVENOUS

## 2022-10-23 MED ORDER — DEXAMETHASONE SODIUM PHOSPHATE 10 MG/ML IJ SOLN
INTRAMUSCULAR | Status: DC | PRN
  Administered 2022-10-23: 10 mg via INTRAVENOUS

## 2022-10-23 MED ORDER — PHENYLEPHRINE HCL (PRESSORS) 10 MG/ML IV SOLN
INTRAVENOUS | Status: AC
  Filled 2022-10-23: qty 1

## 2022-10-23 MED ORDER — LACTATED RINGERS IV SOLN: INTRAVENOUS | Status: DC

## 2022-10-23 MED ORDER — METHOCARBAMOL 500 MG IVPB - SIMPLE MED
500.0000 mg | Freq: Four times a day (QID) | INTRAVENOUS | Status: DC | PRN
  Filled 2022-10-23: qty 55

## 2022-10-23 MED ORDER — SUCCINYLCHOLINE CHLORIDE 200 MG/10ML IV SOSY
PREFILLED_SYRINGE | INTRAVENOUS | Status: DC | PRN
  Administered 2022-10-23: 200 mg via INTRAVENOUS

## 2022-10-23 MED ORDER — ACETAMINOPHEN 10 MG/ML IV SOLN
INTRAVENOUS | Status: AC
  Filled 2022-10-23: qty 100

## 2022-10-23 MED ORDER — FENTANYL CITRATE (PF) 100 MCG/2ML IJ SOLN
INTRAMUSCULAR | Status: DC | PRN
  Administered 2022-10-23 (×3): 50 ug via INTRAVENOUS
  Administered 2022-10-23: 100 ug via INTRAVENOUS
  Administered 2022-10-23 (×4): 50 ug via INTRAVENOUS

## 2022-10-23 MED ORDER — FENTANYL CITRATE PF 50 MCG/ML IJ SOSY
25.0000 ug | PREFILLED_SYRINGE | INTRAMUSCULAR | Status: DC | PRN
  Administered 2022-10-23 (×2): 50 ug via INTRAVENOUS

## 2022-10-23 MED ORDER — GABAPENTIN 300 MG PO CAPS
300.0000 mg | ORAL_CAPSULE | Freq: Three times a day (TID) | ORAL | Status: DC
  Administered 2022-10-23 – 2022-10-29 (×17): 300 mg via ORAL
  Filled 2022-10-23 (×11): qty 1
  Filled 2022-10-23: qty 3
  Filled 2022-10-23: qty 1
  Filled 2022-10-23: qty 3
  Filled 2022-10-23 (×3): qty 1

## 2022-10-23 MED ORDER — LIDOCAINE 2% (20 MG/ML) 5 ML SYRINGE
INTRAMUSCULAR | Status: DC | PRN
  Administered 2022-10-23: 100 mg via INTRAVENOUS

## 2022-10-23 MED ORDER — PROCHLORPERAZINE EDISYLATE 10 MG/2ML IJ SOLN
10.0000 mg | INTRAMUSCULAR | Status: DC | PRN
  Administered 2022-10-30: 10 mg via INTRAVENOUS
  Filled 2022-10-23: qty 2

## 2022-10-23 MED ORDER — KETOROLAC TROMETHAMINE 15 MG/ML IJ SOLN: 15.0000 mg | INTRAMUSCULAR | Status: DC

## 2022-10-23 MED ORDER — DOCUSATE SODIUM 100 MG PO CAPS
100.0000 mg | ORAL_CAPSULE | Freq: Two times a day (BID) | ORAL | Status: DC
  Administered 2022-10-23 – 2022-10-29 (×12): 100 mg via ORAL
  Filled 2022-10-23 (×12): qty 1

## 2022-10-23 MED ORDER — HYDROMORPHONE HCL 1 MG/ML IJ SOLN
0.2500 mg | INTRAMUSCULAR | Status: DC | PRN
  Administered 2022-10-23: 0.5 mg via INTRAVENOUS

## 2022-10-23 MED ORDER — DEXMEDETOMIDINE HCL IN NACL 80 MCG/20ML IV SOLN
INTRAVENOUS | Status: DC | PRN
  Administered 2022-10-23: 12 ug via INTRAVENOUS

## 2022-10-23 MED ORDER — OXYCODONE HCL 5 MG PO TABS: 5.0000 mg | ORAL_TABLET | ORAL | Status: DC | PRN

## 2022-10-23 MED ORDER — LIDOCAINE HCL (PF) 2 % IJ SOLN
INTRAMUSCULAR | Status: DC | PRN
  Administered 2022-10-23: 1 mg/kg/h via INTRADERMAL

## 2022-10-23 MED ORDER — OXYCODONE HCL 5 MG PO TABS: 5.0000 mg | ORAL_TABLET | Freq: Once | ORAL | Status: DC | PRN

## 2022-10-23 MED ORDER — BUPIVACAINE-EPINEPHRINE 0.25% -1:200000 IJ SOLN
INTRAMUSCULAR | Status: AC
  Filled 2022-10-23: qty 1

## 2022-10-23 MED ORDER — ACETAMINOPHEN 500 MG PO TABS
1000.0000 mg | ORAL_TABLET | Freq: Once | ORAL | Status: AC
  Administered 2022-10-23: 1000 mg via ORAL
  Filled 2022-10-23: qty 2

## 2022-10-23 MED ORDER — ORAL CARE MOUTH RINSE: 15.0000 mL | Freq: Once | OROMUCOSAL | Status: AC

## 2022-10-23 MED ORDER — OXYCODONE HCL 5 MG/5ML PO SOLN: 5.0000 mg | Freq: Once | ORAL | Status: DC | PRN

## 2022-10-23 MED ORDER — ENOXAPARIN SODIUM 40 MG/0.4ML IJ SOSY
40.0000 mg | PREFILLED_SYRINGE | INTRAMUSCULAR | Status: DC
  Administered 2022-10-24 – 2022-11-16 (×21): 40 mg via SUBCUTANEOUS
  Filled 2022-10-23 (×21): qty 0.4

## 2022-10-23 MED ORDER — OXYCODONE HCL 5 MG PO TABS
10.0000 mg | ORAL_TABLET | ORAL | Status: DC | PRN
  Administered 2022-10-24 – 2022-10-26 (×4): 10 mg via ORAL
  Filled 2022-10-23 (×4): qty 2

## 2022-10-23 MED ORDER — CEFAZOLIN IN SODIUM CHLORIDE 3-0.9 GM/100ML-% IV SOLN
3.0000 g | INTRAVENOUS | Status: AC
  Administered 2022-10-23: 3 g via INTRAVENOUS
  Administered 2022-10-23: 2 g via INTRAVENOUS
  Filled 2022-10-23: qty 100

## 2022-10-23 MED ORDER — SIMETHICONE 80 MG PO CHEW
80.0000 mg | CHEWABLE_TABLET | Freq: Four times a day (QID) | ORAL | Status: DC | PRN
  Administered 2022-10-25: 80 mg via ORAL
  Filled 2022-10-23 (×4): qty 1

## 2022-10-23 MED ORDER — ROCURONIUM BROMIDE 10 MG/ML (PF) SYRINGE
PREFILLED_SYRINGE | INTRAVENOUS | Status: DC | PRN
  Administered 2022-10-23: 30 mg via INTRAVENOUS
  Administered 2022-10-23: 20 mg via INTRAVENOUS
  Administered 2022-10-23 (×3): 30 mg via INTRAVENOUS
  Administered 2022-10-23: 20 mg via INTRAVENOUS
  Administered 2022-10-23: 50 mg via INTRAVENOUS
  Administered 2022-10-23: 20 mg via INTRAVENOUS
  Administered 2022-10-23: 30 mg via INTRAVENOUS

## 2022-10-23 MED ORDER — ACETAMINOPHEN 10 MG/ML IV SOLN
INTRAVENOUS | Status: DC | PRN
  Administered 2022-10-23: 1000 mg via INTRAVENOUS

## 2022-10-23 MED ORDER — FENTANYL CITRATE PF 50 MCG/ML IJ SOSY
PREFILLED_SYRINGE | INTRAMUSCULAR | Status: AC
  Administered 2022-10-23: 50 ug via INTRAVENOUS
  Filled 2022-10-23: qty 3

## 2022-10-23 MED ORDER — CHLORHEXIDINE GLUCONATE 0.12 % MT SOLN
15.0000 mL | Freq: Once | OROMUCOSAL | Status: AC
  Administered 2022-10-23: 15 mL via OROMUCOSAL

## 2022-10-23 MED ORDER — BUPIVACAINE-EPINEPHRINE (PF) 0.25% -1:200000 IJ SOLN
INTRAMUSCULAR | Status: DC | PRN
  Administered 2022-10-23: 50 mL

## 2022-10-23 MED ORDER — BUPIVACAINE LIPOSOME 1.3 % IJ SUSP: 20.0000 mL | Freq: Once | INTRAMUSCULAR | Status: DC

## 2022-10-23 MED ORDER — METOCLOPRAMIDE HCL 5 MG/ML IJ SOLN
10.0000 mg | Freq: Four times a day (QID) | INTRAMUSCULAR | Status: AC
  Administered 2022-10-23 – 2022-10-28 (×19): 10 mg via INTRAVENOUS
  Filled 2022-10-23 (×20): qty 2

## 2022-10-23 SURGICAL SUPPLY — 70 items
ADH SKN CLS APL DERMABOND .7 (GAUZE/BANDAGES/DRESSINGS) ×3
ANTIFOG SOL W/FOAM PAD STRL (MISCELLANEOUS) ×1
APL PRP STRL LF DISP 70% ISPRP (MISCELLANEOUS) ×1
BAG COUNTER SPONGE SURGICOUNT (BAG) ×1 IMPLANT
BAG SPNG CNTER NS LX DISP (BAG) ×1
BLADE SURG SZ11 CARB STEEL (BLADE) ×1 IMPLANT
CHLORAPREP W/TINT 26 (MISCELLANEOUS) ×1 IMPLANT
COVER MAYO STAND STRL (DRAPES) ×1 IMPLANT
COVER TIP SHEARS 8 DVNC (MISCELLANEOUS) ×1 IMPLANT
DERMABOND ADVANCED .7 DNX12 (GAUZE/BANDAGES/DRESSINGS) IMPLANT
DEVICE TROCAR PUNCTURE CLOSURE (ENDOMECHANICALS) IMPLANT
DRAPE ARM DVNC X/XI (DISPOSABLE) ×3 IMPLANT
DRAPE COLUMN DVNC XI (DISPOSABLE) ×1 IMPLANT
DRIVER NDL LRG 8 DVNC XI (INSTRUMENTS) ×2 IMPLANT
DRIVER NDL MEGA SUTCUT DVNCXI (INSTRUMENTS) ×2 IMPLANT
DRIVER NDLE LRG 8 DVNC XI (INSTRUMENTS) IMPLANT
DRIVER NDLE MEGA SUTCUT DVNCXI (INSTRUMENTS) ×1 IMPLANT
ELECT L-HOOK LAP 45CM DISP (ELECTROSURGICAL) ×1
ELECT PENCIL ROCKER SW 15FT (MISCELLANEOUS) ×1 IMPLANT
ELECT REM PT RETURN 15FT ADLT (MISCELLANEOUS) ×1 IMPLANT
ELECTRODE L-HOOK LAP 45CM DISP (ELECTROSURGICAL) ×1 IMPLANT
FORCEPS BPLR FENES DVNC XI (FORCEP) IMPLANT
FORCEPS PROGRASP DVNC XI (FORCEP) ×1 IMPLANT
GLOVE BIO SURGEON STRL SZ7.5 (GLOVE) ×2 IMPLANT
GLOVE BIOGEL PI IND STRL 8 (GLOVE) ×2 IMPLANT
GOWN STRL REUS W/ TWL XL LVL3 (GOWN DISPOSABLE) ×3 IMPLANT
GOWN STRL REUS W/TWL XL LVL3 (GOWN DISPOSABLE) ×3
GRASPER SUT TROCAR 14GX15 (MISCELLANEOUS) IMPLANT
GRASPER TIP-UP FEN DVNC XI (INSTRUMENTS) ×1 IMPLANT
IRRIG SUCT STRYKERFLOW 2 WTIP (MISCELLANEOUS) ×1
IRRIGATION SUCT STRKRFLW 2 WTP (MISCELLANEOUS) IMPLANT
KIT BASIN OR (CUSTOM PROCEDURE TRAY) ×1 IMPLANT
KIT TURNOVER KIT A (KITS) IMPLANT
MANIFOLD NEPTUNE II (INSTRUMENTS) ×1 IMPLANT
MESH SOFT 12X12IN BARD (Mesh General) IMPLANT
MESH VICRYL KNITTED 12X12 (Mesh General) IMPLANT
NDL SPNL 18GX3.5 QUINCKE PK (NEEDLE) ×1 IMPLANT
NEEDLE SPNL 18GX3.5 QUINCKE PK (NEEDLE) ×1 IMPLANT
PACK CARDIOVASCULAR III (CUSTOM PROCEDURE TRAY) ×1 IMPLANT
SCISSORS MNPLR CVD DVNC XI (INSTRUMENTS) ×1 IMPLANT
SEAL UNIV 5-12 XI (MISCELLANEOUS) ×3 IMPLANT
SET TUBE SMOKE EVAC HIGH FLOW (TUBING) ×1 IMPLANT
SOL ELECTROSURG ANTI STICK (MISCELLANEOUS) ×1
SOLUTION ANTFG W/FOAM PAD STRL (MISCELLANEOUS) ×1 IMPLANT
SOLUTION ELECTROSURG ANTI STCK (MISCELLANEOUS) ×1 IMPLANT
SPIKE FLUID TRANSFER (MISCELLANEOUS) ×1 IMPLANT
SUT MNCRL AB 4-0 PS2 18 (SUTURE) ×1 IMPLANT
SUT SILK 0 SH 30 (SUTURE) IMPLANT
SUT STRAFIX PDS 18 CTX (SUTURE) IMPLANT
SUT STRAFIX SYMMETRIC 0-0 24 (SUTURE)
SUT STRAFIX SYMMETRIC 1-0 12 (SUTURE)
SUT STRAFIX SYMMETRIC 1-0 24 (SUTURE)
SUT STRTFX SPIRAL PDS+ 2-0 23 (SUTURE)
SUT VIC AB 2-0 SH 27 (SUTURE) ×3
SUT VIC AB 2-0 SH 27X BRD (SUTURE) IMPLANT
SUT VIC AB 4-0 PS2 18 (SUTURE) IMPLANT
SUT VICRYL 0 TIES 12 18 (SUTURE) IMPLANT
SUT VICRYL 0 UR6 27IN ABS (SUTURE) IMPLANT
SUT VLOC 180 0 6IN GS21 (SUTURE) IMPLANT
SUT VLOC 180 0 9IN GS21 (SUTURE) IMPLANT
SUTURE STRAFIX SYMMETRC 0-0 24 (SUTURE) IMPLANT
SUTURE STRAFIX SYMMETRC 1-0 12 (SUTURE) IMPLANT
SUTURE STRAFIX SYMMETRC 1-0 24 (SUTURE) IMPLANT
SUTURE STRTFX SPRL PDS+ 2-0 23 (SUTURE) IMPLANT
SYR 20ML LL LF (SYRINGE) ×1 IMPLANT
TAPE STRIPS DRAPE STRL (GAUZE/BANDAGES/DRESSINGS) ×1 IMPLANT
TOWEL OR 17X26 10 PK STRL BLUE (TOWEL DISPOSABLE) ×1 IMPLANT
TOWEL OR NON WOVEN STRL DISP B (DISPOSABLE) IMPLANT
TROCAR ADV FIXATION 12X100MM (TROCAR) ×1 IMPLANT
TROCAR Z-THREAD FIOS 5X100MM (TROCAR) ×1 IMPLANT

## 2022-10-23 NOTE — Transfer of Care (Signed)
Immediate Anesthesia Transfer of Care Note  Patient: Kaitlyn Bray  Procedure(s) Performed: ROBOTIC RECURRENT INCISIONAL HERNIA REPAIR WITH MESH WITH BILATERAL TRANVERSUS ABDOMINUS MYOFACIAL RELEASE AND BILATERAL POSTERIOR RECTUS MYOFACIAL RELEASE (Abdomen)  Patient Location: PACU  Anesthesia Type:General  Level of Consciousness: oriented, sedated, and patient cooperative  Airway & Oxygen Therapy: Patient Spontanous Breathing and Patient connected to face mask oxygen  Post-op Assessment: Report given to RN and Post -op Vital signs reviewed and stable  Post vital signs: Reviewed  Last Vitals:  Vitals Value Taken Time  BP 156/99 10/23/22 1527  Temp    Pulse 95 10/23/22 1528  Resp 16 10/23/22 1528  SpO2 98 % 10/23/22 1528  Vitals shown include unvalidated device data.  Last Pain:  Vitals:   10/23/22 0626  TempSrc:   PainSc: 0-No pain         Complications: No notable events documented.

## 2022-10-23 NOTE — H&P (Signed)
Admitting Physician: Hyman Hopes Derwin Reddy  Service: General Surgery  CC: Hernia  Subjective   HPI: Kaitlyn Bray is an 57 y.o. female who is here for robotic hernia repair  Past Medical History:  Diagnosis Date   Asthma    Complication of anesthesia    Heart murmur    Hernia    Hypertension    New onset type 2 diabetes mellitus (HCC) 09/15/2022   PONV (postoperative nausea and vomiting)    Pre-diabetes    S/P hernia repair 08/26/2021   SBO (small bowel obstruction) (HCC) 09/15/2021    Past Surgical History:  Procedure Laterality Date   ABDOMINAL HYSTERECTOMY     one ovary stlil in place. removed uterus cervix and pt thinks fallopian tubes   APPENDECTOMY     CESAREAN SECTION     INCISIONAL HERNIA REPAIR N/A 08/26/2021   Procedure: INCISIONAL HERNIA REPAIR;  Surgeon: [name], MD;  Location: [facility] OR;  Service: General;  Laterality: N/A;   INSERTION OF MESH N/A 08/26/2021   Procedure: INSERTION OF MESH;  Surgeon: [name], MD;  Location: [facility] OR;  Service: General;  Laterality: N/A;   LAPAROTOMY N/A 07/29/2020   Procedure: EXPLORATORY LAPAROTOMY, REPAIR OF VENTRAL HERNIA;  Surgeon: [name], MD;  Location: [facility] OR;  Service: General;  Laterality: N/A;   MYOMECTOMY      Family History  Problem Relation Age of Onset   Hypertension Mother    Stroke Mother    Dementia Mother    Heart disease Mother    Hyperlipidemia Mother    COPD Father    Rheum arthritis Father    Mental illness Father    Heart disease Maternal Grandmother    Heart disease Maternal Grandfather    Alzheimer's disease Maternal Grandfather    Uterine cancer Paternal Grandmother     Social:  reports that she has never smoked. She has never used smokeless tobacco. She reports that she does not drink alcohol and does not use drugs.  Allergies:  Allergies  Allergen Reactions   Codeine Other (See Comments)    Fast heart rate   Latex Other (See Comments)    Redness from bandaids    Penicillins Rash   Sulfa Drugs Cross Reactors Rash    Medications: Current Outpatient Medications  Medication Instructions   albuterol (VENTOLIN HFA) 108 (90 Base) MCG/ACT inhaler 2 puffs, Inhalation, Every 6 hours PRN   Ascorbic Acid (VITAMIN C PO) 1 capsule, Oral, Daily   Cyanocobalamin (B-12 PO) 1 tablet, Oral, Daily   glucose blood test strip Use as instructed   ibuprofen (ADVIL) 200 mg, Oral, Every 8 hours PRN   losartan-hydrochlorothiazide (HYZAAR) 50-12.5 MG tablet 1 tablet, Oral, Daily   meclizine (ANTIVERT) 25 mg, Oral, 3 times daily PRN   metFORMIN (GLUCOPHAGE-XR) 500 mg, Oral, 2 times daily, Take two tablets twice daily   Multiple Vitamins-Minerals (ZINC PO) 1 tablet, Oral, Daily   VITAMIN D PO 1 capsule, Oral, Daily    ROS - all of the below systems have been reviewed with the patient and positives are indicated with bold text General: chills, fever or night sweats Eyes: blurry vision or double vision ENT: epistaxis or sore throat Allergy/Immunology: itchy/watery eyes or nasal congestion Hematologic/Lymphatic: bleeding problems, blood clots or swollen lymph nodes Endocrine: temperature intolerance or unexpected weight changes Breast: new or changing breast lumps or nipple discharge Resp: cough, shortness of breath, or wheezing CV: chest pain or dyspnea on exertion GI: as per HPI GU: dysuria,  trouble voiding, or hematuria MSK: joint pain or joint stiffness Neuro: TIA or stroke symptoms Derm: pruritus and skin lesion changes Psych: anxiety and depression  Objective   PE Blood pressure 138/82, pulse 67, temperature 98.9 F (37.2 C), temperature source Oral, resp. rate 15, weight 124.7 kg, SpO2 96 %. Constitutional: NAD; conversant; no deformities Eyes: Moist conjunctiva; no lid lag; anicteric; PERRL Neck: Trachea midline; no thyromegaly Lungs: Normal respiratory effort; no tactile fremitus CV: RRR; no palpable thrills; no pitting edema GI: very large hernia in  the lower abdomen. Partially reducible with patient lying supine. Very firm and tender while patient is standing, softens up and is partially reducible with the patient laying flat.  MSK: Normal range of motion of extremities; no clubbing/cyanosis Psychiatric: Appropriate affect; alert and oriented x3 Lymphatic: No palpable cervical or axillary lymphadenopathy  Results for orders placed or performed during the hospital encounter of 10/23/22 (from the past 24 hour(s))  Glucose, capillary     Status: None   Collection Time: 10/23/22  6:00 AM  Result Value Ref Range   Glucose-Capillary 99 70 - 99 mg/dL   Comment 1 Notify RN    Comment 2 Document in Chart     Imaging Orders  No imaging studies ordered today  CT Abd/Pel 09/15/21 1. Multiple dilated loops of small bowel with transition point at the site of a ventral abdominal hernia. Findings are compatible with small-bowel obstruction. 2. Postsurgical changes of interval hernia repair with loculated anterior abdominal wall fluid which is likely due to seroma. Overall size of hernia is decreased when compared with prior exam although there is a persistent large hernia sac which is concerning for recurrence.  CT 07/26/22  1. Prior ventral abdominal hernia repair similar size of a moderate-sized fat and nonobstructed bowel containing hernia along the inferior aspect of the repair and a small fat containing hernia along the superior margin of the hernia repair . 2. Subcutaneous edema and skin thickening anterior to the hernia repair mesh, which may reflect cellulitis. 3. Hepatomegaly with diffuse hepatic steatosis.    Assessment and Plan  Kaitlyn Bray is an 57 y.o. female with a large ventral hernia defect measures 13 cm tall by 13 cm wide.  Notes from previous office visit on 12/21/21: Kaitlyn Bray has an early recurrent incisional hernia repair measuring 13 cm tall by 13 cm wide. Her previous repair was an open intraperitoneal onlay  17 cm x 22 cm Ventralex mesh. I recommended a robotic incisional hernia repair with retromuscular mesh placement and removal of previous intraperitoneal mesh with possible transversus abdominis releases. Today there is still significant induration overlying her previous surgical site. I would like her to have as healthy and healed soft tissue overlying this hernia prior to proceeding with repair. The early recurrent nature of this hernia and reoperative field increase her risk of this reoperation. Recommend waiting another 6 months to allow for additional healing prior to proceeding with surgery. In the meantime she should work on weight loss. Will refer her to the healthy weight and wellness team as well as the dietitians to help with this weight loss. If she were to develop worsening symptoms in the meantime, we may have to move up our surgical plans. Follow-up with me in 6 months.  Notes from previous visit 06/16/22: She feels the hernia is gotten much larger since her last visit. I recommended proceeding with surgery as recommended above. Prior to surgery I would like to repeat a CT scan for  preoperative planning purposes. Once that CT is completed, we will proceed with scheduling surgery.  She is due for a colonoscopy, however she may need to go through hernia surgery first to avoid complications during the colonoscopy.  Notes from today: Today she presents for surgery. We reviewed her recent CT.  I again recommended a robotic incisional hernia repair with retromuscular mesh placement and removal of previous intraperitoneal mesh with possible transversus abdominis releases.  We again discussed the procedure, its risks, benefits and alternatives. After a full discussion and all questions answered, the patient granted consent to proceed. We will proceed as scheduled.     [name], MD  [facility] Use [email or web address] to contact on call provider

## 2022-10-23 NOTE — Op Note (Signed)
Patient: Kaitlyn Bray (1965/10/13, [id number])  Date of Surgery: 10/23/2022   Preoperative Diagnosis: RECURRENT VENTRAL HERNIA  13cm tall by 13 cm wide based on preoperative CT  Postoperative Diagnosis: RECURRENT VENTRAL HERNIA   13cm tall by 13 cm wide based on preoperative CT  Surgical Procedure:  ROBOTIC RECURRENT INCISIONAL HERNIA REPAIR WITH MESH  BILATERAL TRANVERSUS ABDOMINUS MYOFACIAL RELEASE  BILATERAL POSTERIOR RECTUS MYOFACIAL RELEASE REMOVAL OF PREVIOUS INTRAPERITONEAL MESH TWO HOUR ROBOTIC LYSIS OF ADHESIONS  Operative Team Members:  Surgeon(s) and Role:    * Keiva Dina, Hyman Hopes, MD - Primary   Anesthesiologist: Bethena Midget, MD CRNA: Briant Sites, CRNA; Basilio Cairo, CRNA; Romie Minus K, CRNA   Anesthesia: General   Fluids:  Total I/O In: 1800 [I.V.:1500; IV Piggyback:300] Out: 330 [Urine:290; Blood:40]  Complications: None  Drains:  None  Specimen: None  Disposition:  PACU - hemodynamically stable.  Plan of Care: Admit to inpatient   Indications for Procedure: Kaitlyn Bray is a 57 y.o. female who presented with a early recurrent incisional hernia after open intraperitoneal onlay mesh repair using 17cm x 22 cm Ventralex mesh.  I recommended robotic recurrent ventral hernia repair with mesh with separation of components and removal of previous intraperitoneal mesh.  The procedure itself as well as the risks, benefits and alternatives were described.  The risks discussed included but were not limited to the risk of infection, bleeding, damage to nearby structures, recurrent hernia, chronic pain, and mesh complication requiring removal.  After a full discussion and all questions answered, the patient granted consent to proceed.  Findings:  Hernia Location: Ventral hernia location: Epigastric (M2), Umbilical (M3), Infraumbilical (M4), and Suprapubic (M5) Hernia Size:  13 x 13 cm  Mesh Size &Type:  34 cm tall x 30 cm wide Bard Soft  Mesh Mesh Position: Sublay - Retromuscular Myofascial Releases:  BILATERAL TRANVERSUS ABDOMINUS MYOFACIAL RELEASE  BILATERAL POSTERIOR RECTUS MYOFACIAL RELEASE  20cm x 20 cm vicryl mesh patch of posterior layer of repair  Previous 17cm x 22 cm Ventralex mesh removed:    Description of Procedure: The patient was positioned supine, moderately flexed at the umbilical level, padded and secured on the operating table.  A timeout procedure was performed.    What is described is a robotic, totally extraperitoneal retromuscular incisional hernia repair with bilateral rectus myofascial release and retromuscular mesh placement.  Laparoscopic Portion: The retrorectus space was entered in the LEFT hypochondrium, at approximately the midclavicular line utilizing a 5 mm optical-viewing trocar.  Upon safe entry into this space, it was insufflated while performing a blunt dissection with the camera still in the optical trocar. A rectus myofascial release was performed on the LEFT side. Dissection was carried out laterally in the retromuscular plane to the edge of the rectus sheath progressively disconnecting the rectus muscle from the underlying posterior rectus sheath. Both the segmental innervation as well as the intercostal artery and vein brances to the rectus muscle were individually preserved.    During the left sided retrorectus dissection, a 12 mm trocar was placed into the lateral most edge of the retrorectus space.  With these initial trocars in position, the medial most aspect of the retrorectus plane was identified, and the posterior sheath was visualized as it inserted on the linea alba. The posterior sheath was incised with cautery entering the preperitoneal plane. A crossover was performed dissecting under the linea alba in the preperitoneal plane until the right rectus sheath was identified.  After identification of  the right rectus sheath, it was incised vertically to enter the retrorectus  space on the right. A rectus myofascial release was performed on the RIGHT side.  Blunt dissection was carried out laterally in the retromuscular plane to the edge of the rectus sheath progressively disconnecting the rectus muscle from the underlying posterior rectus sheath. Both the segmental innervation as well as the intercostal artery and vein brances to the rectus muscle were individually preserved.   At this juncture, both retrorectus planes were initially connected to each other and there was space for further trocar placement. An 8 mm robotic trocar was placed in the midclavicular line in right retrorectus space.  A 8mm robotic trocar was placed within the left rectus musculature in the upper abdomen, and not through the linea alba.  The initial 5 mm access trocar in the midclavicular line within the left retrorectus space was switched out for an 8 mm robotic trocar.   Robotic Portion: The Intuitive daVinci Xi surgical robot was docked in the standard fashion and the procedure begun from the robotic console. A Prograsp instrument and monopolar shears were used for the dissection.  Dissection was carried down inferiorly preserving the peritoneum and the preperitoneal fat in the midline as it was gently dissected off of the overlying linea alba.  On the right side, the posterior rectus sheath was progressively disconnected from its insertion on the linea alba. This allowed for progression of the right side rectus myofascial release.  The rectus myofascial release accomplished medialization of the posterior rectus sheath towards the midline and disinsertion of the rectus muscle from its surrounding fascia, and thus its encasement in the rectus sheath, allowing for widening of the rectus muscle and transfer of the rectus flap towards the midline.  This will allow for future inset of the medial aspect of the flap for abdominal wall reconstruction.  Similarly, on the left side, the posterior rectus sheath  was also progressively disconnected from its insertion on the linea alba.  This allowed for progression of the left side rectus myofascial release.  The rectus myofascial release accomplished medialization of the posterior rectus sheath towards the midline and disinsertion of the rectus muscle from its surrounding fascia, and thus its encasement in the rectus sheath, allowing for widening of the rectus muscle and transfer of the rectus flap towards the midline.  This will allow for future inset of the medial aspect of the flap for abdominal wall reconstruction.  During the dissection of the midline the hernia defect was identified and the hernia sac was not reducible, therefore the hernia peritoneum was incised circumferentially around the edge of the hernia defect which left a defect within the peritoneum in the midline.  Through the defect in the peritoneum, I worked tediously for two hours lysing adhesions between the intestines and the mesh.  The mesh had fallen away from the abdominal wall and the intestines were stuck firmly to both sides of the mesh.  I worked slowly and carefully to sharply dissect the bowel off the mesh and did not notice any enterotomies during the procedure.    Finally, the adhesions between the bowel and the abdominal wall were completely lysed.  I then dissected the previous mesh off the abdominal wall and removed it through the assistant trocar.  This was photographed and discarded.  I then continued dissecting out the myofascial releases.  Both the left and the right rectus myofascial releases were performed towards the lower abdomen, past the arcuate line bilaterally.  During this dissection, the peritoneum and preperitoneal fat in the midline were further preserved below the hernia as they were dissected off of the overlying linea alba.   The hernia defect area was now visualized fully.  The hernia defects were located in the Epigastric (M2), Umbilical (M3), Infraumbilical  (M4), and Suprapubic (M5) regions. The hernia size was consistent with the 13cm x 13cm measurement on CT.  I decided to perform bilateral transversus abdominis myofascial releases to help close the posterior layer and allow for additional mesh overlap to cover this large hernia defect.    A transversus abdominis release (TAR) was performed on the right side.  The transversus abdominis muscle was identified deep to the posterior rectus sheath and incised vertically along its entire length, entering the pre-peritoneal or pre-transversalis fascia plane.  This disinserted the transversus abdominis muscle from the linea semilunaris.  Since the intercostal nerves, arteries and veins had been preserved during the rectus myofascial release portion of the procedure, they remained intact during the TAR. The peritoneum was subsequently peeled away from the underside of the divided transversus abdominis muscle.  This dissection was carried out laterally towards the retroperitoneum.  The TAR accomplished additional medialization of the posterior rectus sheath with its attached peritoneum towards the midline to allow for visceral sac closure.  The TAR also provided further offset of tension of the rectus muscle flap with additional transfer of the rectus muscle towards the midline, as it remained attached to the external and internal abdominal oblique muscles.  This will allow for future inset of the medial aspect of the flap for abdominal wall reconstruction.    A transversus abdominis release (TAR) was performed on the left side.  The transversus abdominis muscle was identified deep to the posterior rectus sheath and incised vertically along its entire length, entering the pre-peritoneal or pre-transversalis fascia plane.  This disinserted the transversus abdominis muscle from the linea semilunaris.  Since the intercostal nerves, arteries and veins had been preserved during the rectus myofascial release portion of the  procedure, they remained intact during the TAR. The peritoneum was subsequently peeled away from the underside of the divided transversus abdominis muscle.  This dissection was carried out laterally towards the retroperitoneum.  The TAR accomplished additional medialization of the posterior rectus sheath with its attached peritoneum towards the midline to allow for visceral sac closure.  The TAR also provided further offset of tension of the rectus muscle flap with additional transfer of the rectus muscle towards the midline, as it remained attached to the external and internal abdominal oblique muscles.  This will allow for future inset of the medial aspect of the flap for abdominal wall reconstruction.   The posterior layer was left with a large midline defect at the end of the dissection.  I decided to patch this with a vicryl mesh to avoid interparietal hernia complications postoperatively.  A piece of vicryl mesh was cut to 20cm x 20 cm to fit the defect and placed in the abdomen.  2-0 vicryl suture was used to suture this to the divided peritoneum and posterior rectus sheaths circumferentially.  With this sewn in place, the posterior layer and visceral sac was recreated without defects.     The hernia defect was unable to be closed due to the size of the defect.  I decided to perform a bridging repair.  The dissected out retrorectus space was measured with a metric ruler so as to determine the size of the proposed mesh. A  piece of Bard Soft Mesh was opened and trimmed to 34 cm tall x 30 cm wide. The mesh was advanced into the retrorectus space and the mesh positioned flat against the intact posterior rectus sheaths.  0-silk sutures were used to fixate the mesh to Coopers ligament bilaterally.  The robot was undocked and the laparoscope was inserted, inspecting for hemostasis.  The mesh fixation was performed laparoscopically.  The PMI suture passer was then used to place transfascial 0 silk suture  fixation for the mesh circumfrentially using a total of 15 fixating sutures.    A transversus abdominis plane (TAP) block was performed bilaterally with a mixture of marcaine and Exparel.  The anesthetic was first injected into the plane between the transversus abdominis and internal abdominal oblique muscles on the left. The TAP was repeated on the contralateral side.   The trocars were removed.  The 12mm trocar fascial defect was closed at the fascial level using 0 vicryl suture as it had been stretched by removing the old mesh.  The skin closed with 4-0 Monocryl subcuticular sutures and skin glue.  All sponge and needle counts were correct at the end of this case.   Ivar Drape, MD General, Bariatric, & Minimally Invasive Surgery [facility], [place]

## 2022-10-23 NOTE — Anesthesia Procedure Notes (Signed)
Procedure Name: Intubation Date/Time: 10/23/2022 7:49 AM  Performed by: Basilio Cairo, CRNAPre-anesthesia Checklist: Patient identified, Patient being monitored, Timeout performed, Emergency Drugs available and Suction available Patient Re-evaluated:Patient Re-evaluated prior to induction Oxygen Delivery Method: Circle system utilized Preoxygenation: Pre-oxygenation with 100% oxygen Induction Type: IV induction Ventilation: Mask ventilation without difficulty Laryngoscope Size: Mac and 3 Grade View: Grade I Tube type: Oral Tube size: 7.0 mm Number of attempts: 1 Airway Equipment and Method: Stylet Placement Confirmation: ETT inserted through vocal cords under direct vision, positive ETCO2 and breath sounds checked- equal and bilateral Secured at: 22 cm Tube secured with: Tape Dental Injury: Teeth and Oropharynx as per pre-operative assessment  Comments: EMT student placed ETT via DL x 1 with MDA/CRNA at bedside to assist. No complications.

## 2022-10-23 NOTE — Anesthesia Postprocedure Evaluation (Signed)
Anesthesia Post Note  Patient: Kaitlyn Bray  Procedure(s) Performed: ROBOTIC RECURRENT INCISIONAL HERNIA REPAIR WITH MESH WITH BILATERAL TRANVERSUS ABDOMINUS MYOFACIAL RELEASE AND BILATERAL POSTERIOR RECTUS MYOFACIAL RELEASE (Abdomen)     Patient location during evaluation: PACU Anesthesia Type: General Level of consciousness: awake and alert Pain management: pain level controlled Vital Signs Assessment: post-procedure vital signs reviewed and stable Respiratory status: spontaneous breathing, nonlabored ventilation, respiratory function stable and patient connected to nasal cannula oxygen Cardiovascular status: blood pressure returned to baseline and stable Postop Assessment: no apparent nausea or vomiting Anesthetic complications: no   No notable events documented.  Last Vitals:  Vitals:   10/23/22 1700 10/23/22 1758  BP: (!) 152/88 (!) 165/90  Pulse: 82 85  Resp: 12 18  Temp:  36.9 C  SpO2: 97% 99%    Last Pain:  Vitals:   10/23/22 1758  TempSrc: Oral  PainSc:                  Jessejames Steelman

## 2022-10-24 ENCOUNTER — Encounter (HOSPITAL_COMMUNITY): Payer: Self-pay | Admitting: Surgery

## 2022-10-24 LAB — BASIC METABOLIC PANEL
Anion gap: 9 (ref 5–15)
BUN: 15 mg/dL (ref 6–20)
CO2: 25 mmol/L (ref 22–32)
Calcium: 8.1 mg/dL — ABNORMAL LOW (ref 8.9–10.3)
Chloride: 103 mmol/L (ref 98–111)
Creatinine, Ser: 0.62 mg/dL (ref 0.44–1.00)
GFR, Estimated: >60 mL/min (ref >60)
Glucose, Bld: 135 mg/dL — ABNORMAL HIGH (ref 70–99)
Potassium: 3.5 mmol/L (ref 3.5–5.1)
Sodium: 137 mmol/L (ref 135–145)

## 2022-10-24 LAB — CBC
HCT: 38.5 % (ref 36.0–46.0)
Hemoglobin: 12 g/dL (ref 12.0–15.0)
MCH: 27.8 pg (ref 26.0–34.0)
MCHC: 31.2 g/dL (ref 30.0–36.0)
MCV: 89.3 fL (ref 80.0–100.0)
Platelets: 245 10*3/uL (ref 150–400)
RBC: 4.31 MIL/uL (ref 3.87–5.11)
RDW: 14.1 % (ref 11.5–15.5)
WBC: 14.3 10*3/uL — ABNORMAL HIGH (ref 4.0–10.5)
nRBC: 0 % (ref 0.0–0.2)

## 2022-10-24 NOTE — TOC CM/SW Note (Signed)
Transition of Care [facility]) - Inpatient Brief Assessment  Patient Details  Name: HAFSA LOHN MRN: [id number] Date of Birth: 1965/11/08  [facility]) CM/SW Contact:    Ewing Schlein, LCSW Phone Number: 10/24/2022, 10:16 AM  Clinical Narrative: Screening completed. No TOC needs identified at this time.  Transition of Care Asessment: Insurance and Status: Insurance coverage has been reviewed Patient has primary care physician: Yes Home environment has been reviewed: Resides at home with spouse Prior level of function:: Independent at baseline Prior/Current Home Services: No current home services Social Determinants of Health Reivew: SDOH reviewed no interventions necessary Readmission risk has been reviewed: Yes Transition of care needs: no transition of care needs at this time

## 2022-10-24 NOTE — Progress Notes (Signed)
Patient temp decreased from Incentive Spirometry usage, bath, and tylenol. Patient also on 2L O2 currently for comfort. IV fluid (Lactated Ringer's @ 50mL restarted also.)  Patient states "I feel a lot better, especially all that we did. Thank you."   HR still over 120. Will continue to monitor. VS to be checked around 2AM.     10/24/22 2231  Vitals  Temp 100.1 F (37.8 C)  Temp Source Oral  BP 122/71  MAP (mmHg) 86  BP Location Left Arm  BP Method Automatic  Patient Position (if appropriate) Lying  Pulse Rate (!) 121  Pulse Rate Source Monitor  Resp 18  MEWS COLOR  MEWS Score Color Yellow  Oxygen Therapy  SpO2 99 %  O2 Device Nasal Cannula  O2 Flow Rate (L/min) 2 L/min  MEWS Score  MEWS Temp 0  MEWS Systolic 0  MEWS Pulse 2  MEWS RR 0  MEWS LOC 0  MEWS Score 2

## 2022-10-24 NOTE — Progress Notes (Signed)
Patient event, Time of 2058 on 10/24/2022  Patient fever of 103.37F, HR of 140s, BP of 118/68. Suspected CO2 increased since she explained she had not used the Incentive Spirometry all day. When demonstrated, she was using incorrectly.   Teach back and patient appropriately used - IS went up to .   Patient was also given scheduled medications, including tylenol at this time. Will monitor and assess patient in 1 hour.     MEWS YELLOW - Charge RN notified   10/24/22 2100  Vitals  Temp (!) 103.9 F (39.9 C)  Temp Source Oral  Pulse Rate  (Notify RN)  Level of Consciousness  Level of Consciousness Alert  MEWS COLOR  MEWS Score Color Yellow  MEWS Score  MEWS Temp 2  MEWS Systolic 0  MEWS Pulse 1  MEWS RR 0  MEWS LOC 0  MEWS Score 3

## 2022-10-24 NOTE — Progress Notes (Signed)
Progress Note: General Surgery Service   Chief Complaint/Subjective: Drinking, no nausea, no flatus or bowel movements  Objective: Vital signs in last 24 hours: Temp:  [97.5 F (36.4 C)-98.9 F (37.2 C)] 97.6 F (36.4 C) (06/11 1235) Pulse Rate:  [82-110] 109 (06/11 1235) Resp:  [12-18] 18 (06/11 1235) BP: (121-177)/(64-107) 126/74 (06/11 1235) SpO2:  [90 %-99 %] 96 % (06/11 1235) Last BM Date : 10/22/22 (per report)  Intake/Output from previous day: 06/10 0701 - 06/11 0700 In: 3945.9 [P.O.:1200; I.V.:2445.9; IV Piggyback:300] Out: 1855 [Urine:1815; Blood:40] Intake/Output this shift: Total I/O In: 420 [P.O.:420] Out: 450 [Urine:450]  GI: Abd hernia sac feels to have filled with fluid  Lab Results: CBC  Recent Labs    10/23/22 1639 10/24/22 0446  WBC 19.6* 14.3*  HGB 13.0 12.0  HCT 42.0 38.5  PLT 256 245   BMET Recent Labs    10/23/22 1639 10/24/22 0446  NA  --  137  K  --  3.5  CL  --  103  CO2  --  25  GLUCOSE  --  135*  BUN  --  15  CREATININE 0.83 0.62  CALCIUM  --  8.1*   PT/INR No results for input(s): "LABPROT", "INR" in the last 72 hours. ABG No results for input(s): "PHART", "HCO3" in the last 72 hours.  Invalid input(s): "PCO2", "PO2"  Anti-infectives: Anti-infectives (From admission, onward)    Start     Dose/Rate Route Frequency Ordered Stop   10/23/22 1054  ceFAZolin (ANCEF) 2-4 GM/100ML-% IVPB       Note to Pharmacy: Augustine Radar R: cabinet override      10/23/22 1054 10/23/22 2259   10/23/22 0600  ceFAZolin (ANCEF) IVPB 3g/100 mL premix        3 g 200 mL/hr over 30 Minutes Intravenous On call to O.R. 10/23/22 0540 10/23/22 1213       Medications: Scheduled Meds:  acetaminophen  650 mg Oral Q6H   docusate sodium  100 mg Oral BID   enoxaparin (LOVENOX) injection  40 mg Subcutaneous Q24H   gabapentin  300 mg Oral TID   ketorolac  15 mg Intravenous Q8H   metoCLOPramide (REGLAN) injection  10 mg Intravenous Q6H   Continuous  Infusions:  lactated ringers 50 mL/hr at 10/23/22 2027   methocarbamol (ROBAXIN) IV     PRN Meds:.HYDROmorphone (DILAUDID) injection, methocarbamol (ROBAXIN) IV, ondansetron (ZOFRAN) IV, oxyCODONE, oxyCODONE, prochlorperazine, simethicone  Assessment/Plan: s/p Procedure(s): ROBOTIC RECURRENT INCISIONAL HERNIA REPAIR WITH MESH WITH BILATERAL TRANVERSUS ABDOMINUS MYOFACIAL RELEASE AND BILATERAL POSTERIOR RECTUS MYOFACIAL RELEASE 10/23/2022  Doing well POD1 Continue clear liquids Take it easy, do not strain abdominal wall Pain and muscle relaxant medication  Awaiting return of bowel function before advancing diet   LOS: 1 day    [name], MD  [facility] Use [email or web address] to contact on call provider  Daily Billing: [phone] - post op

## 2022-10-25 LAB — CBC
HCT: 37.5 % (ref 36.0–46.0)
Hemoglobin: 11.6 g/dL — ABNORMAL LOW (ref 12.0–15.0)
MCH: 27.8 pg (ref 26.0–34.0)
MCHC: 30.9 g/dL (ref 30.0–36.0)
MCV: 89.7 fL (ref 80.0–100.0)
Platelets: 218 10*3/uL (ref 150–400)
RBC: 4.18 MIL/uL (ref 3.87–5.11)
RDW: 14.4 % (ref 11.5–15.5)
WBC: 13.2 10*3/uL — ABNORMAL HIGH (ref 4.0–10.5)
nRBC: 0 % (ref 0.0–0.2)

## 2022-10-25 LAB — BASIC METABOLIC PANEL
Anion gap: 9 (ref 5–15)
BUN: 14 mg/dL (ref 6–20)
CO2: 25 mmol/L (ref 22–32)
Calcium: 7.7 mg/dL — ABNORMAL LOW (ref 8.9–10.3)
Chloride: 99 mmol/L (ref 98–111)
Creatinine, Ser: 0.76 mg/dL (ref 0.44–1.00)
GFR, Estimated: >60 mL/min (ref >60)
Glucose, Bld: 177 mg/dL — ABNORMAL HIGH (ref 70–99)
Potassium: 3.7 mmol/L (ref 3.5–5.1)
Sodium: 133 mmol/L — ABNORMAL LOW (ref 135–145)

## 2022-10-25 NOTE — Progress Notes (Signed)
Progress Note: General Surgery Service   Chief Complaint/Subjective: Drinking, passing flatus.  Coughing up clear flem.  Low grade fever yesterday  Objective: Vital signs in last 24 hours: Temp:  [97.6 F (36.4 C)-103.9 F (39.9 C)] 99.5 F (37.5 C) (06/12 1006) Pulse Rate:  [100-128] 100 (06/12 1006) Resp:  [18-22] 22 (06/12 1006) BP: (109-136)/(44-80) 130/71 (06/12 1006) SpO2:  [94 %-99 %] 97 % (06/12 1006) Last BM Date : 10/22/22  Intake/Output from previous day: 06/11 0701 - 06/12 0700 In: 2165.7 [P.O.:1800; I.V.:365.7] Out: 1100 [Urine:1100] Intake/Output this shift: Total I/O In: 480 [P.O.:480] Out: 200 [Urine:200]  GI: Abd hernia sac feels to have filled with fluid  Lab Results: CBC  Recent Labs    10/24/22 0446 10/25/22 0427  WBC 14.3* 13.2*  HGB 12.0 11.6*  HCT 38.5 37.5  PLT 245 218    BMET Recent Labs    10/24/22 0446 10/25/22 0427  NA 137 133*  K 3.5 3.7  CL 103 99  CO2 25 25  GLUCOSE 135* 177*  BUN 15 14  CREATININE 0.62 0.76  CALCIUM 8.1* 7.7*    PT/INR No results for input(s): "LABPROT", "INR" in the last 72 hours. ABG No results for input(s): "PHART", "HCO3" in the last 72 hours.  Invalid input(s): "PCO2", "PO2"  Anti-infectives: Anti-infectives (From admission, onward)    Start     Dose/Rate Route Frequency Ordered Stop   10/23/22 1054  ceFAZolin (ANCEF) 2-4 GM/100ML-% IVPB       Note to Pharmacy: Augustine Radar R: cabinet override      10/23/22 1054 10/23/22 2259   10/23/22 0600  ceFAZolin (ANCEF) IVPB 3g/100 mL premix        3 g 200 mL/hr over 30 Minutes Intravenous On call to O.R. 10/23/22 0540 10/23/22 1213       Medications: Scheduled Meds:  acetaminophen  650 mg Oral Q6H   docusate sodium  100 mg Oral BID   enoxaparin (LOVENOX) injection  40 mg Subcutaneous Q24H   gabapentin  300 mg Oral TID   ketorolac  15 mg Intravenous Q8H   metoCLOPramide (REGLAN) injection  10 mg Intravenous Q6H   Continuous Infusions:   lactated ringers Stopped (10/25/22 0314)   methocarbamol (ROBAXIN) IV     PRN Meds:.HYDROmorphone (DILAUDID) injection, methocarbamol (ROBAXIN) IV, ondansetron (ZOFRAN) IV, oxyCODONE, oxyCODONE, prochlorperazine, simethicone  Assessment/Plan: s/p Procedure(s): ROBOTIC RECURRENT INCISIONAL HERNIA REPAIR WITH MESH WITH BILATERAL TRANVERSUS ABDOMINUS MYOFACIAL RELEASE AND BILATERAL POSTERIOR RECTUS MYOFACIAL RELEASE 10/23/2022  Doing well POD2 Stop abdominal binder as I think it is contributing to atelectasis and not putting support on the hernia Try full liquids today Increase activity slowly and use incentive spirometer Pain and muscle relaxant medication     LOS: 2 days    [name], MD  [facility] Use [email or web address] to contact on call provider  Daily Billing: [phone] - post op

## 2022-10-26 LAB — BASIC METABOLIC PANEL
Anion gap: 9 (ref 5–15)
BUN: 14 mg/dL (ref 6–20)
CO2: 26 mmol/L (ref 22–32)
Calcium: 7.6 mg/dL — ABNORMAL LOW (ref 8.9–10.3)
Chloride: 97 mmol/L — ABNORMAL LOW (ref 98–111)
Creatinine, Ser: 0.7 mg/dL (ref 0.44–1.00)
GFR, Estimated: >60 mL/min (ref >60)
Glucose, Bld: 153 mg/dL — ABNORMAL HIGH (ref 70–99)
Potassium: 3.9 mmol/L (ref 3.5–5.1)
Sodium: 132 mmol/L — ABNORMAL LOW (ref 135–145)

## 2022-10-26 LAB — CBC
HCT: 35.7 % — ABNORMAL LOW (ref 36.0–46.0)
Hemoglobin: 11.4 g/dL — ABNORMAL LOW (ref 12.0–15.0)
MCH: 28.4 pg (ref 26.0–34.0)
MCHC: 31.9 g/dL (ref 30.0–36.0)
MCV: 88.8 fL (ref 80.0–100.0)
Platelets: 213 10*3/uL (ref 150–400)
RBC: 4.02 MIL/uL (ref 3.87–5.11)
RDW: 14 % (ref 11.5–15.5)
WBC: 11.7 10*3/uL — ABNORMAL HIGH (ref 4.0–10.5)
nRBC: 0 % (ref 0.0–0.2)

## 2022-10-26 LAB — GLUCOSE, CAPILLARY
Glucose-Capillary: 115 mg/dL — ABNORMAL HIGH (ref 70–99)
Glucose-Capillary: 151 mg/dL — ABNORMAL HIGH (ref 70–99)
Glucose-Capillary: 126 mg/dL — ABNORMAL HIGH (ref 70–99)
Glucose-Capillary: 185 mg/dL — ABNORMAL HIGH (ref 70–99)

## 2022-10-26 MED ORDER — ACETAMINOPHEN 325 MG PO TABS
650.0000 mg | ORAL_TABLET | Freq: Four times a day (QID) | ORAL | Status: DC | PRN
  Administered 2022-10-26 – 2022-10-28 (×5): 650 mg via ORAL
  Filled 2022-10-26 (×6): qty 2

## 2022-10-26 MED ORDER — INSULIN ASPART 100 UNIT/ML IJ SOLN: 0.0000 [IU] | Freq: Every day | INTRAMUSCULAR | Status: DC

## 2022-10-26 MED ORDER — INSULIN ASPART 100 UNIT/ML IJ SOLN
0.0000 [IU] | Freq: Three times a day (TID) | INTRAMUSCULAR | Status: DC
  Administered 2022-10-26: 3 [IU] via SUBCUTANEOUS
  Administered 2022-10-26 – 2022-10-28 (×6): 2 [IU] via SUBCUTANEOUS
  Administered 2022-10-28: 3 [IU] via SUBCUTANEOUS
  Administered 2022-10-29 (×2): 2 [IU] via SUBCUTANEOUS

## 2022-10-26 MED ORDER — HYDROCODONE-ACETAMINOPHEN 5-325 MG PO TABS
1.0000 | ORAL_TABLET | ORAL | Status: DC | PRN
  Administered 2022-10-26: 2 via ORAL
  Filled 2022-10-26: qty 2

## 2022-10-26 NOTE — Progress Notes (Signed)
Progress Note: General Surgery Service   Chief Complaint/Subjective: Drinking, passing flatus.  Percocet makes her hot.  Hungry for food.  Objective: Vital signs in last 24 hours: Temp:  [98.7 F (37.1 C)-99.8 F (37.7 C)] 98.9 F (37.2 C) (06/13 0447) Pulse Rate:  [98-122] 104 (06/13 0447) Resp:  [18-22] 19 (06/13 0447) BP: (113-140)/(69-79) 113/71 (06/13 0447) SpO2:  [94 %-97 %] 94 % (06/13 0447) Last BM Date : 10/22/22  Intake/Output from previous day: 06/12 0701 - 06/13 0700 In: 2280 [P.O.:2280] Out: 1450 [Urine:1450] Intake/Output this shift: Total I/O In: 240 [P.O.:240] Out: 850 [Urine:850]  GI: Abd hernia sac feels to have filled with fluid  Lab Results: CBC  Recent Labs    10/25/22 0427 10/26/22 0453  WBC 13.2* 11.7*  HGB 11.6* 11.4*  HCT 37.5 35.7*  PLT 218 213    BMET Recent Labs    10/25/22 0427 10/26/22 0453  NA 133* 132*  K 3.7 3.9  CL 99 97*  CO2 25 26  GLUCOSE 177* 153*  BUN 14 14  CREATININE 0.76 0.70  CALCIUM 7.7* 7.6*    PT/INR No results for input(s): "LABPROT", "INR" in the last 72 hours. ABG No results for input(s): "PHART", "HCO3" in the last 72 hours.  Invalid input(s): "PCO2", "PO2"  Anti-infectives: Anti-infectives (From admission, onward)    Start     Dose/Rate Route Frequency Ordered Stop   10/23/22 1054  ceFAZolin (ANCEF) 2-4 GM/100ML-% IVPB       Note to Pharmacy: Augustine Radar R: cabinet override      10/23/22 1054 10/23/22 2259   10/23/22 0600  ceFAZolin (ANCEF) IVPB 3g/100 mL premix        3 g 200 mL/hr over 30 Minutes Intravenous On call to O.R. 10/23/22 0540 10/23/22 1213       Medications: Scheduled Meds:  acetaminophen  650 mg Oral Q6H   docusate sodium  100 mg Oral BID   enoxaparin (LOVENOX) injection  40 mg Subcutaneous Q24H   gabapentin  300 mg Oral TID   ketorolac  15 mg Intravenous Q8H   metoCLOPramide (REGLAN) injection  10 mg Intravenous Q6H   Continuous Infusions:  lactated ringers  Stopped (10/25/22 0314)   methocarbamol (ROBAXIN) IV     PRN Meds:.HYDROmorphone (DILAUDID) injection, methocarbamol (ROBAXIN) IV, ondansetron (ZOFRAN) IV, oxyCODONE, oxyCODONE, prochlorperazine, simethicone  Assessment/Plan: s/p Procedure(s): ROBOTIC RECURRENT INCISIONAL HERNIA REPAIR WITH MESH WITH BILATERAL TRANVERSUS ABDOMINUS MYOFACIAL RELEASE AND BILATERAL POSTERIOR RECTUS MYOFACIAL RELEASE 10/23/2022  Doing well POD3 Regular diet Switch percocet to norco Increase activity slowly and use incentive spirometer Pain and muscle relaxant medication     LOS: 3 days    [name], MD  [facility] Use [email or web address] to contact on call provider  Daily Billing: [phone] - post op

## 2022-10-27 ENCOUNTER — Inpatient Hospital Stay (HOSPITAL_COMMUNITY): Payer: 59

## 2022-10-27 LAB — CBC
HCT: 34.7 % — ABNORMAL LOW (ref 36.0–46.0)
Hemoglobin: 11 g/dL — ABNORMAL LOW (ref 12.0–15.0)
MCH: 28.1 pg (ref 26.0–34.0)
MCHC: 31.7 g/dL (ref 30.0–36.0)
MCV: 88.5 fL (ref 80.0–100.0)
Platelets: 237 10*3/uL (ref 150–400)
RBC: 3.92 MIL/uL (ref 3.87–5.11)
RDW: 14.1 % (ref 11.5–15.5)
WBC: 13.7 10*3/uL — ABNORMAL HIGH (ref 4.0–10.5)
nRBC: 0 % (ref 0.0–0.2)

## 2022-10-27 LAB — URINALYSIS, ROUTINE W REFLEX MICROSCOPIC
Bilirubin Urine: NEGATIVE
Glucose, UA: NEGATIVE mg/dL
Hgb urine dipstick: NEGATIVE
Ketones, ur: NEGATIVE mg/dL
Leukocytes,Ua: NEGATIVE
Nitrite: NEGATIVE
Protein, ur: 100 mg/dL — AB
Specific Gravity, Urine: 1.017 (ref 1.005–1.030)
pH: 5 (ref 5.0–8.0)

## 2022-10-27 LAB — BASIC METABOLIC PANEL
Anion gap: 9 (ref 5–15)
BUN: 17 mg/dL (ref 6–20)
CO2: 25 mmol/L (ref 22–32)
Calcium: 7.6 mg/dL — ABNORMAL LOW (ref 8.9–10.3)
Chloride: 98 mmol/L (ref 98–111)
Creatinine, Ser: 0.67 mg/dL (ref 0.44–1.00)
GFR, Estimated: >60 mL/min (ref >60)
Glucose, Bld: 143 mg/dL — ABNORMAL HIGH (ref 70–99)
Potassium: 3.7 mmol/L (ref 3.5–5.1)
Sodium: 132 mmol/L — ABNORMAL LOW (ref 135–145)

## 2022-10-27 LAB — GLUCOSE, CAPILLARY
Glucose-Capillary: 125 mg/dL — ABNORMAL HIGH (ref 70–99)
Glucose-Capillary: 131 mg/dL — ABNORMAL HIGH (ref 70–99)
Glucose-Capillary: 142 mg/dL — ABNORMAL HIGH (ref 70–99)
Glucose-Capillary: 156 mg/dL — ABNORMAL HIGH (ref 70–99)

## 2022-10-27 MED ORDER — LORATADINE 10 MG PO TABS
10.0000 mg | ORAL_TABLET | Freq: Every day | ORAL | Status: DC
  Administered 2022-10-27 – 2022-10-28 (×2): 10 mg via ORAL
  Filled 2022-10-27 (×2): qty 1

## 2022-10-27 MED ORDER — PIPERACILLIN-TAZOBACTAM 3.375 G IVPB
3.3750 g | Freq: Three times a day (TID) | INTRAVENOUS | Status: DC
  Administered 2022-10-27 – 2022-11-14 (×53): 3.375 g via INTRAVENOUS
  Filled 2022-10-27 (×52): qty 50

## 2022-10-27 MED ORDER — AZITHROMYCIN 250 MG PO TABS: 250.0000 mg | ORAL_TABLET | Freq: Every day | ORAL | Status: DC

## 2022-10-27 MED ORDER — TRAMADOL HCL 50 MG PO TABS
50.0000 mg | ORAL_TABLET | Freq: Four times a day (QID) | ORAL | Status: DC | PRN
  Administered 2022-10-27 – 2022-10-28 (×3): 50 mg via ORAL
  Administered 2022-10-29: 100 mg via ORAL
  Filled 2022-10-27: qty 2
  Filled 2022-10-27 (×3): qty 1

## 2022-10-27 MED ORDER — FLUTICASONE PROPIONATE 50 MCG/ACT NA SUSP
2.0000 | Freq: Every day | NASAL | Status: DC
  Administered 2022-10-27 – 2022-11-05 (×8): 2 via NASAL
  Filled 2022-10-27: qty 16

## 2022-10-27 MED ORDER — AZITHROMYCIN 250 MG PO TABS
500.0000 mg | ORAL_TABLET | Freq: Every day | ORAL | Status: AC
  Administered 2022-10-27: 500 mg via ORAL
  Filled 2022-10-27: qty 2

## 2022-10-27 NOTE — Progress Notes (Signed)
Progress Note: General Surgery Service   Chief Complaint/Subjective: Eating, passing flatus.   Objective: Vital signs in last 24 hours: Temp:  [98.6 F (37 C)-101.8 F (38.8 C)] 98.6 F (37 C) (06/14 0512) Pulse Rate:  [107] 107 (06/14 0512) Resp:  [18-20] 18 (06/14 0512) BP: (142-154)/(76-82) 154/82 (06/14 0512) SpO2:  [97 %-98 %] 97 % (06/14 0512) Last BM Date : 10/22/22  Intake/Output from previous day: 06/13 0701 - 06/14 0700 In: 1110 [P.O.:1110] Out: 1050 [Urine:1050] Intake/Output this shift: No intake/output data recorded.  GI: Abd hernia sac feels to have filled with fluid  Lab Results: CBC  Recent Labs    10/26/22 0453 10/27/22 0432  WBC 11.7* 13.7*  HGB 11.4* 11.0*  HCT 35.7* 34.7*  PLT 213 237    BMET Recent Labs    10/26/22 0453 10/27/22 0432  NA 132* 132*  K 3.9 3.7  CL 97* 98  CO2 26 25  GLUCOSE 153* 143*  BUN 14 17  CREATININE 0.70 0.67  CALCIUM 7.6* 7.6*    PT/INR No results for input(s): "LABPROT", "INR" in the last 72 hours. ABG No results for input(s): "PHART", "HCO3" in the last 72 hours.  Invalid input(s): "PCO2", "PO2"  Anti-infectives: Anti-infectives (From admission, onward)    Start     Dose/Rate Route Frequency Ordered Stop   10/23/22 1054  ceFAZolin (ANCEF) 2-4 GM/100ML-% IVPB       Note to Pharmacy: Augustine Radar R: cabinet override      10/23/22 1054 10/23/22 2259   10/23/22 0600  ceFAZolin (ANCEF) IVPB 3g/100 mL premix        3 g 200 mL/hr over 30 Minutes Intravenous On call to O.R. 10/23/22 0540 10/23/22 1213       Medications: Scheduled Meds:  docusate sodium  100 mg Oral BID   enoxaparin (LOVENOX) injection  40 mg Subcutaneous Q24H   gabapentin  300 mg Oral TID   insulin aspart  0-15 Units Subcutaneous TID WC   insulin aspart  0-5 Units Subcutaneous QHS   ketorolac  15 mg Intravenous Q8H   metoCLOPramide (REGLAN) injection  10 mg Intravenous Q6H   Continuous Infusions:  methocarbamol (ROBAXIN) IV      PRN Meds:.acetaminophen, HYDROcodone-acetaminophen, HYDROmorphone (DILAUDID) injection, methocarbamol (ROBAXIN) IV, ondansetron (ZOFRAN) IV, prochlorperazine, simethicone  Assessment/Plan: s/p Procedure(s): ROBOTIC RECURRENT INCISIONAL HERNIA REPAIR WITH MESH WITH BILATERAL TRANVERSUS ABDOMINUS MYOFACIAL RELEASE AND BILATERAL POSTERIOR RECTUS MYOFACIAL RELEASE 10/23/2022  Low grade temperatures slight increase in white count Having sinus pressure and headaches consistent with sinus infection - will start z-pack Regular diet, awaiting bowel movement prior to discharge Norco prn Increase activity slowly and use incentive spirometer Pain and muscle relaxant medication      LOS: 4 days    [name], MD  [facility] Use [email or web address] to contact on call provider  Daily Billing: [phone] - post op

## 2022-10-27 NOTE — Progress Notes (Signed)
Mobility Specialist - Progress Note   10/27/22 1315  Mobility  Activity Ambulated independently in hallway  Level of Assistance Independent  Assistive Device None  Distance Ambulated (ft) 400 ft  Range of Motion/Exercises Active  Activity Response Tolerated well  Mobility Referral Yes  $Mobility charge 1 Mobility  Mobility Specialist Start Time (ACUTE ONLY) 1305  Mobility Specialist Stop Time (ACUTE ONLY) 1315  Mobility Specialist Time Calculation (min) (ACUTE ONLY) 10 min   Pt was found in bathroom and agreeable to ambulate. Stated feeling a little sore on her lower abdomin. At EOS returned to recliner chair with all needs met.  Billey Chang Mobility Specialist

## 2022-10-28 LAB — BASIC METABOLIC PANEL
Anion gap: 10 (ref 5–15)
BUN: 15 mg/dL (ref 6–20)
CO2: 25 mmol/L (ref 22–32)
Calcium: 7.8 mg/dL — ABNORMAL LOW (ref 8.9–10.3)
Chloride: 96 mmol/L — ABNORMAL LOW (ref 98–111)
Creatinine, Ser: 0.78 mg/dL (ref 0.44–1.00)
GFR, Estimated: >60 mL/min (ref >60)
Glucose, Bld: 128 mg/dL — ABNORMAL HIGH (ref 70–99)
Potassium: 3.7 mmol/L (ref 3.5–5.1)
Sodium: 131 mmol/L — ABNORMAL LOW (ref 135–145)

## 2022-10-28 LAB — CBC
HCT: 33.7 % — ABNORMAL LOW (ref 36.0–46.0)
Hemoglobin: 10.5 g/dL — ABNORMAL LOW (ref 12.0–15.0)
MCH: 27.7 pg (ref 26.0–34.0)
MCHC: 31.2 g/dL (ref 30.0–36.0)
MCV: 88.9 fL (ref 80.0–100.0)
Platelets: 259 10*3/uL (ref 150–400)
RBC: 3.79 MIL/uL — ABNORMAL LOW (ref 3.87–5.11)
RDW: 14.1 % (ref 11.5–15.5)
WBC: 12.6 10*3/uL — ABNORMAL HIGH (ref 4.0–10.5)
nRBC: 0 % (ref 0.0–0.2)

## 2022-10-28 LAB — GLUCOSE, CAPILLARY
Glucose-Capillary: 124 mg/dL — ABNORMAL HIGH (ref 70–99)
Glucose-Capillary: 158 mg/dL — ABNORMAL HIGH (ref 70–99)
Glucose-Capillary: 135 mg/dL — ABNORMAL HIGH (ref 70–99)
Glucose-Capillary: 145 mg/dL — ABNORMAL HIGH (ref 70–99)

## 2022-10-28 MED ORDER — BISACODYL 10 MG RE SUPP
10.0000 mg | Freq: Once | RECTAL | Status: AC
  Administered 2022-10-28: 10 mg via RECTAL
  Filled 2022-10-28: qty 1

## 2022-10-28 NOTE — Progress Notes (Signed)
Pt given Dulcolax supp w zero results.

## 2022-10-28 NOTE — Progress Notes (Signed)
Mobility Specialist - Progress Note   10/28/22 0916  Mobility  Activity Ambulated independently in hallway  Level of Assistance Independent after set-up  Assistive Device None  Distance Ambulated (ft) 600 ft  Range of Motion/Exercises Active  Activity Response Tolerated well  Mobility Referral Yes  $Mobility charge 1 Mobility  Mobility Specialist Start Time (ACUTE ONLY) 0905  Mobility Specialist Stop Time (ACUTE ONLY) 0916  Mobility Specialist Time Calculation (min) (ACUTE ONLY) 11 min   Pt was found on recliner chair and agreeable to ambulate. Stated lower abdomin feels sore during session. At EOS returned to use bathroom. Was left with all needs met.  Billey Chang Mobility Specialist

## 2022-10-28 NOTE — Progress Notes (Signed)
Progress Note: General Surgery Service   Chief Complaint/Subjective: Eating, passing flatus. No BM. Still having some fevers  Objective: Vital signs in last 24 hours: Temp:  [98.5 F (36.9 C)-102.9 F (39.4 C)] 98.6 F (37 C) (06/15 0810) Pulse Rate:  [88-110] 88 (06/15 0810) Resp:  [18] 18 (06/15 0439) BP: (133-155)/(72-82) 144/77 (06/15 0810) SpO2:  [93 %-97 %] 97 % (06/15 0810) Last BM Date : 10/23/22  Intake/Output from previous day: 06/14 0701 - 06/15 0700 In: 1521.6 [P.O.:1440; IV Piggyback:81.6] Out: 2050 [Urine:2050] Intake/Output this shift: Total I/O In: 240 [P.O.:240] Out: 300 [Urine:300]  GI: Abd hernia sac feels to have filled with fluid, mild dependent erythema   Lab Results: CBC  Recent Labs    10/27/22 0432 10/28/22 0509  WBC 13.7* 12.6*  HGB 11.0* 10.5*  HCT 34.7* 33.7*  PLT 237 259    BMET Recent Labs    10/27/22 0432 10/28/22 0509  NA 132* 131*  K 3.7 3.7  CL 98 96*  CO2 25 25  GLUCOSE 143* 128*  BUN 17 15  CREATININE 0.67 0.78  CALCIUM 7.6* 7.8*    PT/INR No results for input(s): "LABPROT", "INR" in the last 72 hours. ABG No results for input(s): "PHART", "HCO3" in the last 72 hours.  Invalid input(s): "PCO2", "PO2"  Anti-infectives: Anti-infectives (From admission, onward)    Start     Dose/Rate Route Frequency Ordered Stop   10/28/22 1000  azithromycin (ZITHROMAX) tablet 250 mg  Status:  Discontinued       See Hyperspace for full Linked Orders Report.   250 mg Oral Daily 10/27/22 0738 10/27/22 1653   10/27/22 1700  piperacillin-tazobactam (ZOSYN) IVPB 3.375 g        3.375 g 12.5 mL/hr over 240 Minutes Intravenous Every 8 hours 10/27/22 1653     10/27/22 1000  azithromycin (ZITHROMAX) tablet 500 mg       See Hyperspace for full Linked Orders Report.   500 mg Oral Daily 10/27/22 0738 10/27/22 0946   10/23/22 1054  ceFAZolin (ANCEF) 2-4 GM/100ML-% IVPB       Note to Pharmacy: Augustine Radar R: cabinet override       10/23/22 1054 10/23/22 2259   10/23/22 0600  ceFAZolin (ANCEF) IVPB 3g/100 mL premix        3 g 200 mL/hr over 30 Minutes Intravenous On call to O.R. 10/23/22 0540 10/23/22 1213       Medications: Scheduled Meds:  bisacodyl  10 mg Rectal Once   docusate sodium  100 mg Oral BID   enoxaparin (LOVENOX) injection  40 mg Subcutaneous Q24H   fluticasone  2 spray Each Nare Daily   gabapentin  300 mg Oral TID   insulin aspart  0-15 Units Subcutaneous TID WC   insulin aspart  0-5 Units Subcutaneous QHS   ketorolac  15 mg Intravenous Q8H   loratadine  10 mg Oral Daily   metoCLOPramide (REGLAN) injection  10 mg Intravenous Q6H   Continuous Infusions:  methocarbamol (ROBAXIN) IV     piperacillin-tazobactam (ZOSYN)  IV 3.375 g (10/28/22 0323)   PRN Meds:.acetaminophen, HYDROmorphone (DILAUDID) injection, methocarbamol (ROBAXIN) IV, ondansetron (ZOFRAN) IV, prochlorperazine, simethicone, traMADol  Assessment/Plan: s/p Procedure(s): ROBOTIC RECURRENT INCISIONAL HERNIA REPAIR WITH MESH WITH BILATERAL TRANVERSUS ABDOMINUS MYOFACIAL RELEASE AND BILATERAL POSTERIOR RECTUS MYOFACIAL RELEASE 10/23/2022  Fevers: started on Zosyn for pos UA, no culture results pending Regular diet, awaiting bowel movement prior to discharge Norco prn Increase activity slowly and use incentive spirometer better  today Pain and muscle relaxant medication      LOS: 5 days    Vanita Panda, MD  [facility] Use [email or web address] to contact on call provider  Daily Billing: [phone] - post op

## 2022-10-29 ENCOUNTER — Encounter (HOSPITAL_COMMUNITY): Payer: Self-pay | Admitting: Surgery

## 2022-10-29 ENCOUNTER — Encounter (HOSPITAL_COMMUNITY)
Admission: RE | Disposition: A | Discharge status: Rehabilitation Facility | Payer: Self-pay | Source: Home / Self Care | Attending: Surgery

## 2022-10-29 ENCOUNTER — Inpatient Hospital Stay (HOSPITAL_COMMUNITY): Payer: 59

## 2022-10-29 ENCOUNTER — Other Ambulatory Visit: Payer: Self-pay

## 2022-10-29 ENCOUNTER — Inpatient Hospital Stay (HOSPITAL_COMMUNITY): Payer: 59 | Admitting: Certified Registered Nurse Anesthetist

## 2022-10-29 DIAGNOSIS — I10 Essential (primary) hypertension: Secondary | ICD-10-CM | POA: Diagnosis not present

## 2022-10-29 DIAGNOSIS — K659 Peritonitis, unspecified: Secondary | ICD-10-CM | POA: Diagnosis present

## 2022-10-29 DIAGNOSIS — K9189 Other postprocedural complications and disorders of digestive system: Secondary | ICD-10-CM | POA: Diagnosis not present

## 2022-10-29 DIAGNOSIS — S36409A Unspecified injury of unspecified part of small intestine, initial encounter: Secondary | ICD-10-CM

## 2022-10-29 DIAGNOSIS — Z9911 Dependence on respirator [ventilator] status: Secondary | ICD-10-CM | POA: Diagnosis not present

## 2022-10-29 DIAGNOSIS — K631 Perforation of intestine (nontraumatic): Secondary | ICD-10-CM

## 2022-10-29 DIAGNOSIS — K432 Incisional hernia without obstruction or gangrene: Secondary | ICD-10-CM

## 2022-10-29 HISTORY — PX: LAPAROTOMY: SHX154

## 2022-10-29 LAB — GLUCOSE, CAPILLARY
Glucose-Capillary: 125 mg/dL — ABNORMAL HIGH (ref 70–99)
Glucose-Capillary: 129 mg/dL — ABNORMAL HIGH (ref 70–99)
Glucose-Capillary: 128 mg/dL — ABNORMAL HIGH (ref 70–99)
Glucose-Capillary: 108 mg/dL — ABNORMAL HIGH (ref 70–99)
Glucose-Capillary: 154 mg/dL — ABNORMAL HIGH (ref 70–99)

## 2022-10-29 LAB — BLOOD GAS, ARTERIAL
Acid-Base Excess: 0.4 mmol/L (ref 0.0–2.0)
Bicarbonate: 27.6 mmol/L (ref 20.0–28.0)
Drawn by: 59133
FIO2: 100 %
MECHVT: 460 mL
O2 Saturation: 100 %
PEEP: 5 cmH2O
Patient temperature: 37.2
RATE: 20 resp/min
pCO2 arterial: 56 mmHg — ABNORMAL HIGH (ref 32–48)
pH, Arterial: 7.3 — ABNORMAL LOW (ref 7.35–7.45)
pO2, Arterial: 345 mmHg — ABNORMAL HIGH (ref 83–108)

## 2022-10-29 LAB — LACTIC ACID, PLASMA: Lactic Acid, Venous: 1.5 mmol/L (ref 0.5–1.9)

## 2022-10-29 LAB — MRSA NEXT GEN BY PCR, NASAL: MRSA by PCR Next Gen: NOT DETECTED

## 2022-10-29 SURGERY — LAPAROTOMY, EXPLORATORY
Anesthesia: General

## 2022-10-29 MED ORDER — FENTANYL 2500MCG IN NS 250ML (10MCG/ML) PREMIX INFUSION
50.0000 ug/h | INTRAVENOUS | Status: DC
  Administered 2022-10-29: 50 ug/h via INTRAVENOUS
  Administered 2022-10-30: 150 ug/h via INTRAVENOUS
  Administered 2022-10-31 – 2022-11-04 (×9): 200 ug/h via INTRAVENOUS
  Administered 2022-11-05: 100 ug/h via INTRAVENOUS
  Administered 2022-11-05: 150 ug/h via INTRAVENOUS
  Administered 2022-11-06: 50 ug/h via INTRAVENOUS
  Administered 2022-11-07: 200 ug/h via INTRAVENOUS
  Administered 2022-11-07: 150 ug/h via INTRAVENOUS
  Administered 2022-11-08: 200 ug/h via INTRAVENOUS
  Filled 2022-10-29 (×18): qty 250

## 2022-10-29 MED ORDER — DEXMEDETOMIDINE HCL IN NACL 400 MCG/100ML IV SOLN
0.0000 ug/kg/h | INTRAVENOUS | Status: DC
  Administered 2022-10-29: 0.7 ug/kg/h via INTRAVENOUS
  Administered 2022-10-30: 0.4 ug/kg/h via INTRAVENOUS
  Filled 2022-10-29 (×2): qty 100

## 2022-10-29 MED ORDER — LACTATED RINGERS IV SOLN: INTRAVENOUS | Status: DC | PRN

## 2022-10-29 MED ORDER — MIDAZOLAM HCL 2 MG/2ML IJ SOLN
INTRAMUSCULAR | Status: AC
  Filled 2022-10-29: qty 2

## 2022-10-29 MED ORDER — SCOPOLAMINE 1 MG/3DAYS TD PT72
MEDICATED_PATCH | TRANSDERMAL | Status: DC | PRN
  Administered 2022-10-29: 1 via TRANSDERMAL

## 2022-10-29 MED ORDER — SUCCINYLCHOLINE CHLORIDE 200 MG/10ML IV SOSY
PREFILLED_SYRINGE | INTRAVENOUS | Status: AC
  Filled 2022-10-29: qty 10

## 2022-10-29 MED ORDER — DOCUSATE SODIUM 50 MG/5ML PO LIQD: 100.0000 mg | Freq: Two times a day (BID) | ORAL | Status: DC

## 2022-10-29 MED ORDER — LACTATED RINGERS IV SOLN: INTRAVENOUS | Status: DC

## 2022-10-29 MED ORDER — PROPOFOL 10 MG/ML IV BOLUS
INTRAVENOUS | Status: AC
  Filled 2022-10-29: qty 20

## 2022-10-29 MED ORDER — PROPOFOL 10 MG/ML IV BOLUS
INTRAVENOUS | Status: DC | PRN
  Administered 2022-10-29: 160 mg via INTRAVENOUS

## 2022-10-29 MED ORDER — LIDOCAINE HCL (PF) 2 % IJ SOLN
INTRAMUSCULAR | Status: AC
  Filled 2022-10-29: qty 5

## 2022-10-29 MED ORDER — MIDAZOLAM HCL 5 MG/5ML IJ SOLN
INTRAMUSCULAR | Status: DC | PRN
  Administered 2022-10-29 (×2): 2 mg via INTRAVENOUS

## 2022-10-29 MED ORDER — SUCCINYLCHOLINE CHLORIDE 200 MG/10ML IV SOSY
PREFILLED_SYRINGE | INTRAVENOUS | Status: DC | PRN
  Administered 2022-10-29: 160 mg via INTRAVENOUS

## 2022-10-29 MED ORDER — INSULIN ASPART 100 UNIT/ML IJ SOLN
0.0000 [IU] | INTRAMUSCULAR | Status: DC
  Administered 2022-10-29: 3 [IU] via SUBCUTANEOUS
  Administered 2022-10-30: 2 [IU] via SUBCUTANEOUS
  Administered 2022-10-30: 3 [IU] via SUBCUTANEOUS
  Administered 2022-10-30 (×2): 2 [IU] via SUBCUTANEOUS
  Administered 2022-10-30 (×2): 3 [IU] via SUBCUTANEOUS
  Administered 2022-11-02 – 2022-11-03 (×11): 2 [IU] via SUBCUTANEOUS
  Administered 2022-11-04 (×2): 5 [IU] via SUBCUTANEOUS
  Administered 2022-11-04: 3 [IU] via SUBCUTANEOUS
  Administered 2022-11-04: 2 [IU] via SUBCUTANEOUS
  Administered 2022-11-04: 3 [IU] via SUBCUTANEOUS
  Administered 2022-11-05: 2 [IU] via SUBCUTANEOUS
  Administered 2022-11-05: 3 [IU] via SUBCUTANEOUS
  Administered 2022-11-05: 2 [IU] via SUBCUTANEOUS
  Administered 2022-11-05: 3 [IU] via SUBCUTANEOUS
  Administered 2022-11-05 – 2022-11-06 (×2): 2 [IU] via SUBCUTANEOUS
  Administered 2022-11-06: 3 [IU] via SUBCUTANEOUS
  Administered 2022-11-06 – 2022-11-07 (×5): 2 [IU] via SUBCUTANEOUS
  Administered 2022-11-07: 3 [IU] via SUBCUTANEOUS
  Administered 2022-11-07: 2 [IU] via SUBCUTANEOUS
  Administered 2022-11-07 (×2): 3 [IU] via SUBCUTANEOUS
  Administered 2022-11-07 – 2022-11-08 (×4): 2 [IU] via SUBCUTANEOUS
  Administered 2022-11-08: 3 [IU] via SUBCUTANEOUS
  Administered 2022-11-08 (×3): 2 [IU] via SUBCUTANEOUS
  Administered 2022-11-09 (×2): 3 [IU] via SUBCUTANEOUS
  Administered 2022-11-09: 2 [IU] via SUBCUTANEOUS
  Administered 2022-11-09 – 2022-11-11 (×8): 3 [IU] via SUBCUTANEOUS
  Administered 2022-11-11: 2 [IU] via SUBCUTANEOUS
  Administered 2022-11-11 – 2022-11-12 (×5): 3 [IU] via SUBCUTANEOUS
  Administered 2022-11-12 (×2): 2 [IU] via SUBCUTANEOUS
  Administered 2022-11-12 – 2022-11-13 (×2): 3 [IU] via SUBCUTANEOUS
  Administered 2022-11-13 (×2): 2 [IU] via SUBCUTANEOUS
  Administered 2022-11-13: 3 [IU] via SUBCUTANEOUS
  Administered 2022-11-13 – 2022-11-14 (×2): 2 [IU] via SUBCUTANEOUS

## 2022-10-29 MED ORDER — ONDANSETRON HCL 4 MG/2ML IJ SOLN
INTRAMUSCULAR | Status: AC
  Filled 2022-10-29: qty 2

## 2022-10-29 MED ORDER — KETAMINE HCL 10 MG/ML IJ SOLN
INTRAMUSCULAR | Status: DC | PRN
  Administered 2022-10-29: 30 mg via INTRAVENOUS

## 2022-10-29 MED ORDER — ROCURONIUM BROMIDE 10 MG/ML (PF) SYRINGE
PREFILLED_SYRINGE | INTRAVENOUS | Status: DC | PRN
  Administered 2022-10-29: 40 mg via INTRAVENOUS
  Administered 2022-10-29: 60 mg via INTRAVENOUS

## 2022-10-29 MED ORDER — HYDROMORPHONE HCL 1 MG/ML IJ SOLN: 0.2500 mg | INTRAMUSCULAR | Status: DC | PRN

## 2022-10-29 MED ORDER — POLYETHYLENE GLYCOL 3350 17 G PO PACK
17.0000 g | PACK | Freq: Every day | ORAL | Status: DC
  Administered 2022-11-01 – 2022-11-08 (×4): 17 g
  Filled 2022-10-29 (×4): qty 1

## 2022-10-29 MED ORDER — PANTOPRAZOLE SODIUM 40 MG IV SOLR
40.0000 mg | INTRAVENOUS | Status: DC
  Administered 2022-10-29 – 2022-11-10 (×13): 40 mg via INTRAVENOUS
  Filled 2022-10-29 (×13): qty 10

## 2022-10-29 MED ORDER — ACETAMINOPHEN 10 MG/ML IV SOLN
INTRAVENOUS | Status: DC | PRN
  Administered 2022-10-29: 1000 mg via INTRAVENOUS

## 2022-10-29 MED ORDER — FENTANYL BOLUS VIA INFUSION
50.0000 ug | INTRAVENOUS | Status: DC | PRN
  Administered 2022-10-29: 100 ug via INTRAVENOUS
  Administered 2022-10-29 – 2022-10-30 (×2): 50 ug via INTRAVENOUS
  Administered 2022-10-30: 100 ug via INTRAVENOUS
  Administered 2022-10-30: 50 ug via INTRAVENOUS
  Administered 2022-10-30: 100 ug via INTRAVENOUS
  Administered 2022-10-30: 50 ug via INTRAVENOUS
  Administered 2022-10-30 (×2): 100 ug via INTRAVENOUS
  Administered 2022-10-30: 50 ug via INTRAVENOUS
  Administered 2022-10-30: 100 ug via INTRAVENOUS
  Administered 2022-10-31 (×3): 50 ug via INTRAVENOUS
  Administered 2022-11-01 (×2): 100 ug via INTRAVENOUS
  Administered 2022-11-01 – 2022-11-03 (×12): 50 ug via INTRAVENOUS
  Administered 2022-11-04 – 2022-11-06 (×5): 100 ug via INTRAVENOUS
  Administered 2022-11-06 (×4): 50 ug via INTRAVENOUS
  Administered 2022-11-07: 100 ug via INTRAVENOUS
  Administered 2022-11-07 (×2): 50 ug via INTRAVENOUS
  Administered 2022-11-07: 100 ug via INTRAVENOUS
  Administered 2022-11-07 (×2): 50 ug via INTRAVENOUS
  Administered 2022-11-07: 100 ug via INTRAVENOUS
  Administered 2022-11-07: 50 ug via INTRAVENOUS
  Administered 2022-11-07 – 2022-11-08 (×7): 100 ug via INTRAVENOUS

## 2022-10-29 MED ORDER — SODIUM CHLORIDE 0.9 % IV SOLN: INTRAVENOUS | Status: DC | PRN

## 2022-10-29 MED ORDER — ROCURONIUM BROMIDE 10 MG/ML (PF) SYRINGE
PREFILLED_SYRINGE | INTRAVENOUS | Status: AC
  Filled 2022-10-29: qty 10

## 2022-10-29 MED ORDER — FENTANYL CITRATE PF 50 MCG/ML IJ SOSY: 50.0000 ug | PREFILLED_SYRINGE | Freq: Once | INTRAMUSCULAR | Status: DC

## 2022-10-29 MED ORDER — SODIUM CHLORIDE 0.9 % IV SOLN
250.0000 mL | INTRAVENOUS | Status: DC
  Administered 2022-10-29 – 2022-11-12 (×2): 250 mL via INTRAVENOUS

## 2022-10-29 MED ORDER — LACTATED RINGERS IV BOLUS
1000.0000 mL | Freq: Once | INTRAVENOUS | Status: AC
  Administered 2022-10-29: 1000 mL via INTRAVENOUS

## 2022-10-29 MED ORDER — ONDANSETRON HCL 4 MG/2ML IJ SOLN: 4.0000 mg | Freq: Once | INTRAMUSCULAR | Status: DC | PRN

## 2022-10-29 MED ORDER — FENTANYL CITRATE (PF) 250 MCG/5ML IJ SOLN
INTRAMUSCULAR | Status: AC
  Filled 2022-10-29: qty 5

## 2022-10-29 MED ORDER — CHLORHEXIDINE GLUCONATE CLOTH 2 % EX PADS: 6.0000 | MEDICATED_PAD | Freq: Once | CUTANEOUS | Status: DC

## 2022-10-29 MED ORDER — SCOPOLAMINE 1 MG/3DAYS TD PT72
MEDICATED_PATCH | TRANSDERMAL | Status: AC
  Filled 2022-10-29: qty 1

## 2022-10-29 MED ORDER — SODIUM CHLORIDE 0.9 % IV SOLN
INTRAVENOUS | Status: DC | PRN
  Administered 2022-10-29: 10 mL/h via INTRAVENOUS

## 2022-10-29 MED ORDER — NOREPINEPHRINE 4 MG/250ML-% IV SOLN
2.0000 ug/min | INTRAVENOUS | Status: DC
  Administered 2022-10-29: 5 ug/min via INTRAVENOUS
  Administered 2022-10-29: 2 ug/min via INTRAVENOUS
  Filled 2022-10-29: qty 250

## 2022-10-29 MED ORDER — ACETAMINOPHEN 10 MG/ML IV SOLN
INTRAVENOUS | Status: AC
  Filled 2022-10-29: qty 100

## 2022-10-29 MED ORDER — SIMETHICONE 80 MG PO CHEW: 80.0000 mg | CHEWABLE_TABLET | Freq: Four times a day (QID) | ORAL | Status: DC | PRN

## 2022-10-29 MED ORDER — CHLORHEXIDINE GLUCONATE CLOTH 2 % EX PADS
6.0000 | MEDICATED_PAD | Freq: Every day | CUTANEOUS | Status: DC
  Administered 2022-10-29 – 2022-11-15 (×17): 6 via TOPICAL

## 2022-10-29 MED ORDER — ONDANSETRON HCL 4 MG/2ML IJ SOLN
INTRAMUSCULAR | Status: DC | PRN
  Administered 2022-10-29: 4 mg via INTRAVENOUS

## 2022-10-29 MED ORDER — KETAMINE HCL 50 MG/5ML IJ SOSY
PREFILLED_SYRINGE | INTRAMUSCULAR | Status: AC
  Filled 2022-10-29: qty 5

## 2022-10-29 MED ORDER — DEXMEDETOMIDINE HCL IN NACL 200 MCG/50ML IV SOLN
0.0000 ug/kg/h | INTRAVENOUS | Status: DC
  Administered 2022-10-29: 0.4 ug/kg/h via INTRAVENOUS
  Filled 2022-10-29: qty 50

## 2022-10-29 MED ORDER — MIDAZOLAM HCL 2 MG/2ML IJ SOLN
1.0000 mg | INTRAMUSCULAR | Status: DC | PRN
  Administered 2022-10-30 (×5): 2 mg via INTRAVENOUS
  Administered 2022-10-30: 1 mg via INTRAVENOUS
  Administered 2022-10-30: 2 mg via INTRAVENOUS
  Filled 2022-10-29 (×7): qty 2

## 2022-10-29 MED ORDER — DEXAMETHASONE SODIUM PHOSPHATE 10 MG/ML IJ SOLN
INTRAMUSCULAR | Status: DC | PRN
  Administered 2022-10-29: 4 mg via INTRAVENOUS

## 2022-10-29 MED ORDER — ACETAMINOPHEN 160 MG/5ML PO SOLN
650.0000 mg | Freq: Four times a day (QID) | ORAL | Status: DC | PRN
  Administered 2022-11-01 – 2022-11-07 (×7): 650 mg
  Filled 2022-10-29 (×8): qty 20.3

## 2022-10-29 MED ORDER — LIDOCAINE 2% (20 MG/ML) 5 ML SYRINGE
INTRAMUSCULAR | Status: DC | PRN
  Administered 2022-10-29: 80 mg via INTRAVENOUS

## 2022-10-29 MED ORDER — DEXAMETHASONE SODIUM PHOSPHATE 10 MG/ML IJ SOLN
INTRAMUSCULAR | Status: AC
  Filled 2022-10-29: qty 1

## 2022-10-29 MED ORDER — FENTANYL CITRATE (PF) 100 MCG/2ML IJ SOLN
INTRAMUSCULAR | Status: DC | PRN
  Administered 2022-10-29: 200 ug via INTRAVENOUS
  Administered 2022-10-29: 50 ug via INTRAVENOUS

## 2022-10-29 MED ORDER — 0.9 % SODIUM CHLORIDE (POUR BTL) OPTIME
TOPICAL | Status: DC | PRN
  Administered 2022-10-29 (×2): 3000 mL

## 2022-10-29 SURGICAL SUPPLY — 43 items
APL SWBSTK 6 STRL LF DISP (MISCELLANEOUS) ×1
APPLICATOR COTTON TIP 6 STRL (MISCELLANEOUS) ×1 IMPLANT
APPLICATOR COTTON TIP 6IN STRL (MISCELLANEOUS) ×1 IMPLANT
BAG COUNTER SPONGE SURGICOUNT (BAG) IMPLANT
BAG SPNG CNTER NS LX DISP (BAG)
BLADE EXTENDED COATED 6.5IN (ELECTRODE) IMPLANT
BLADE HEX COATED 2.75 (ELECTRODE) ×1 IMPLANT
COVER MAYO STAND STRL (DRAPES) IMPLANT
DRAPE LAPAROSCOPIC ABDOMINAL (DRAPES) ×1 IMPLANT
DRAPE WARM FLUID 44X44 (DRAPES) IMPLANT
ELECT REM PT RETURN 15FT ADLT (MISCELLANEOUS) ×1 IMPLANT
GAUZE SPONGE 4X4 12PLY STRL (GAUZE/BANDAGES/DRESSINGS) ×1 IMPLANT
GLOVE BIOGEL PI IND STRL 7.0 (GLOVE) ×1 IMPLANT
GLOVE ECLIPSE 8.0 STRL XLNG CF (GLOVE) ×1 IMPLANT
GLOVE INDICATOR 8.0 STRL GRN (GLOVE) ×2 IMPLANT
GOWN STRL REUS W/ TWL XL LVL3 (GOWN DISPOSABLE) ×2 IMPLANT
GOWN STRL REUS W/TWL XL LVL3 (GOWN DISPOSABLE) ×2
HANDLE SUCTION POOLE (INSTRUMENTS) IMPLANT
KIT BASIN OR (CUSTOM PROCEDURE TRAY) ×1 IMPLANT
KIT TURNOVER KIT A (KITS) IMPLANT
LIGASURE IMPACT 36 18CM CVD LR (INSTRUMENTS) IMPLANT
NS IRRIG 1000ML POUR BTL (IV SOLUTION) ×1 IMPLANT
PACK GENERAL/GYN (CUSTOM PROCEDURE TRAY) ×1 IMPLANT
RELOAD PROXIMATE 75MM BLUE (ENDOMECHANICALS) ×2 IMPLANT
RELOAD STAPLE 75 3.8 BLU REG (ENDOMECHANICALS) IMPLANT
SPONGE ABD ABTHERA ADVANCE (MISCELLANEOUS) IMPLANT
SPONGE ABDOMINAL VAC ABTHERA (MISCELLANEOUS) IMPLANT
STAPLER GUN LINEAR PROX 60 (STAPLE) IMPLANT
STAPLER PROXIMATE 75MM BLUE (STAPLE) IMPLANT
STAPLER VISISTAT 35W (STAPLE) ×1 IMPLANT
SUCTION POOLE HANDLE (INSTRUMENTS)
SUT SILK 2 0 (SUTURE)
SUT SILK 2 0 SH CR/8 (SUTURE) IMPLANT
SUT SILK 2-0 18XBRD TIE 12 (SUTURE) IMPLANT
SUT SILK 3 0 (SUTURE)
SUT SILK 3 0 SH CR/8 (SUTURE) IMPLANT
SUT SILK 3-0 18XBRD TIE 12 (SUTURE) IMPLANT
SUT VIC AB 2-0 SH 18 (SUTURE) IMPLANT
SUT VIC AB 3-0 SH 18 (SUTURE) IMPLANT
TOWEL OR 17X26 10 PK STRL BLUE (TOWEL DISPOSABLE) ×2 IMPLANT
TRAY FOLEY MTR SLVR 14FR STAT (SET/KITS/TRAYS/PACK) IMPLANT
TRAY FOLEY MTR SLVR 16FR STAT (SET/KITS/TRAYS/PACK) IMPLANT
YANKAUER SUCT BULB TIP NO VENT (SUCTIONS) IMPLANT

## 2022-10-29 NOTE — Progress Notes (Addendum)
eLink Physician-Brief Progress Note Patient Name: Kaitlyn Bray DOB: March 04, 1966 MRN: [id number]   Date of Service  10/29/2022  HPI/Events of Note  57 year old female with a history of type 2 diabetes mellitus, asthma, morbid obesity with BMI 47 who initially presented for a robotic hernia repair.  She underwent hernia repair on 6/10.  She developed intermittent low-grade fevers, sinus pressures and headaches initially treated with azithromycin and then transition to Zosyn.  She had large amount of fluid come out of her anterior wound and underwent reexploration on 6/16.  Her mesh was found to be contaminated with feculent material with areas of discoloration consistent with bowel injury.  Abdomen was left open and a wound VAC was placed with intent to return for operative exploration at 48 hours.  Patient returned from the OR ventilated, tachycardic, and mildly hypertensive.  Postoperative labs including ABG, CBC and CMP are pending.  Postoperative abdominal and chest radiograph are pending.  eICU Interventions  Lung protective ventilation  Adequately sedated on Precedex and fentanyl.  Bowel regimen per surgical team.  Change insulin to every 4 hours  DVT prophylaxis with enoxaparin  GI prophylaxis with pantoprazole   2026 - BP 76/48 (55) PIV, Add Norepi periphreal, request aline  0016 -escalating nor epi requirements.  Will attempt 500 cc LR bolus.  Currently maintaining with single-digit norepinephrine, but can infuse greater than 10 mcg peripherally-order updated.    Intervention Category Evaluation Type: New Patient Evaluation  Choya Tornow 10/29/2022, 7:56 PM

## 2022-10-29 NOTE — Transfer of Care (Signed)
Immediate Anesthesia Transfer of Care Note  Patient: Kaitlyn Bray  Procedure(s) Performed: EXPLORATORY LAPAROTOMY  Patient Location: ICU  Anesthesia Type:General  Level of Consciousness: Patient remains intubated per anesthesia plan  Airway & Oxygen Therapy: Patient remains intubated per anesthesia plan  Post-op Assessment: Report given to RN and Post -op Vital signs reviewed and stable  Post vital signs: Reviewed and stable  Last Vitals:  Vitals Value Taken Time  BP 152/82 10/29/22 1951  Temp    Pulse 109 10/29/22 1955  Resp 20 10/29/22 1955  SpO2 99 % 10/29/22 1955  Vitals shown include unvalidated device data.  Last Pain:  Vitals:   10/29/22 1515  TempSrc: Oral  PainSc:       Patients Stated Pain Goal: 0 (10/29/22 1000)  Complications: No notable events documented.

## 2022-10-29 NOTE — Op Note (Signed)
Preoperative diagnosis: Status post robotic complex ventral hernia repair with component separation with mesh with drainage from abdominal wall foul-smelling and tachycardia  Postoperative diagnosis: Small bowel injury with perforation and gross contamination of abdominal cavity as well as subcutaneous space and mesh  Procedure: Exploratory laparotomy with resection of small bowel perforation and primary anastomosis and excision of mesh with placement of ABThera wound VAC and open abdomen due to complete loss of domain and peritonitis with significant gross contamination of the peritoneal and subcutaneous space  Surgeon: Harriette Bouillon, MD  Anesthesia: General  EBL: 40 cc  Specimen: Small bowel with perforation.  IV fluids: Per anesthesia record  Indications for procedure: The patient is a pleasant 57 year old female who is 6 days status post complex repair using robotic technology and component separation and mesh due to complex painful recurrent ventral hernia.  She had issues at the [facility] with tachycardia but looked better yesterday and even this morning look quite well.  She did have quite a bit of a seroma noted on exam and ecchymoses but she was tolerating her diet and moving her bowels.  This afternoon, the nurses noticed a small blister just around her umbilicus where an old scar was.  She squatted down to go to the bathroom and this exploded shooting contents across the room.  There is copious foul-smelling brown-green fluid noted admixed with blood.  The nurses put her back in bed and cover the dressing.  Upon examination she had copious amounts of fluid flowing from the small blister with erythema.  This was concerning for enteric contents and I recommended emergent exploration to exclude a bowel injury.  Explained the significant risk of the situation.  I discussed the possible need for bowel resection and/or colon resection depending what is found.  I discussed that this may require  removal of her mesh.  Of note she had a bridge to repair due to significant retraction of her abdominal wall musculature.  She did have TAR release bilaterally as well as release of the rectus muscles posteriorly.  This was accomplished robotically as well as removal of old mesh and extensive adhesiolysis.  I explained she may require further surgery and or ICU care depending what is found.  She agreed to proceed after explanation of all the above.The procedure has been discussed with the patient.  Alternative therapies have been discussed with the patient.  Operative risks include bleeding,  Infection,  Organ injury,  Nerve injury,  Blood vessel injury,  DVT,  Pulmonary embolism,  Death,  And possible reoperation.  Medical management risks include worsening of present situation.  The success of the procedure is 50 -90 % at treating patients symptoms.  The patient understands and agrees to proceed.      CASE DATA:  Type of patient?:  Private patient emergent  Status of Case? EMERGENT Add On  Infection Present At Time Of Surgery (PATOS)?  FECULENT PERITONITIS    Description of procedure: The patient was taken to the operating room.  She is placed supine upon the operating table.  After induction of general anesthesia, the abdomen was prepped and draped in a sterile fashion and timeout performed.  The opening to the abdominal wall appeared to be a blister just above the umbilicus.  There is copious amounts of foul smelling fluid that is drained from the opening down the bed onto the floor.  We were able to place a suction and this is sucked out about a liter of this brown foul-smelling  bloody fluid.  This decompressed the subcutaneous space where the hernia was.  This was an open.  Her mesh was visualized.  There is significant feculent like changes to the mesh as well as the subcutaneous space.  This was foul-smelling.  Cultures were taken.  Upon examination of the mesh there is an area of green/brown  discoloration noted just below the mesh.  I examined this carefully and initially nothing came out of it.  We irrigated out the space with about 5 L of normal saline.  There is extensive fibrinous exudate throughout.  After careful exam the stenosis moment of drainage.  Open the mesh in found feculent material that gushed out.  I then opened the mesh in its entirety remove what I could have it since it was grossly contaminated and green in color.  The patient had a small bowel enterotomy and proximal the probable mid jejunum.  The abdominal cavity was significantly contaminated with peritonitis as well as thickening of the small bowel.  I ran what I could for the ligament of Treitz down toward the cecum.  There are areas of discoloration on the bowel at areas proximal and distal to the perforation.  I examined these and saw no evidence of bowel injury and this appeared to be just contamination discoloration from the enterotomy in the small bowel.  I placed silk sutures in it to control it.  I then resected this area using a GIA 75 stapler proximal and distal.  A side-to-side functional end anastomosis was created with a GIA 75 stapler and a TX 60.  This closed the common neurotomy.  Of note there is no bleeding at the anastomosis.  Silk sutures were used to cross stitch as well as to oversew the staple line due to some oozing.  Mesenteric defect was closed with 2-0 silk sutures.  There is no tension and there is no leakage from anastomosis.  The patient's abdominal wall musculature was significantly retracted since this was a bridge to repair.  I did try to debride what mesh I could to get out the contaminated mesh.  We used copious irrigation but she had significant peritonitis and complete loss of domain at this point.  There is no way to close her fascia.  Given that we had significant intra-abdominal contamination from the perforation, we opted to place the ABThera dressing.  She will require second look  operation in 48 hours.  This was placed with the inner leaflet on the bowel which is the plastic part of this.  We then placed the sponges on top of the plastic bowel barrier layer.  Adhesive dressing applied.  This was then placed to suction with good seal.  The patient remained intubated and was taken to the ICU in critical but stable condition.  ICU was notified and consultation arranged with CCM to help postop management of this complex patient.  All counts were found to be correct.

## 2022-10-29 NOTE — Progress Notes (Signed)
6 Days Post-Op   Subjective/Chief Complaint: Feels better today.  Bowels are moving.  Up walking in room.  Pulse rate is slowing.  Low-grade fever to 100 last night but overall feels much better   Objective: Vital signs in last 24 hours: Temp:  [98.1 F (36.7 C)-100 F (37.8 C)] 98.5 F (36.9 C) (06/16 0601) Pulse Rate:  [98-109] 98 (06/16 0601) Resp:  [18] 18 (06/16 0601) BP: (136-142)/(80-83) 136/83 (06/16 0601) SpO2:  [91 %-97 %] 91 % (06/16 0601) Last BM Date : 10/28/22  Intake/Output from previous day: 06/15 0701 - 06/16 0700 In: 1702.6 [P.O.:1560; IV Piggyback:142.6] Out: 300 [Urine:300] Intake/Output this shift: No intake/output data recorded.  General appearance: alert and cooperative Resp: clear to auscultation bilaterally Cardio: NSR  Incision/Wound: Significant swelling and ecchymosis to the abdominal wall.  Tender but appropriate.  Surgical incisions noted.  These are clean.  Lab Results:  Recent Labs    10/27/22 0432 10/28/22 0509  WBC 13.7* 12.6*  HGB 11.0* 10.5*  HCT 34.7* 33.7*  PLT 237 259   BMET Recent Labs    10/27/22 0432 10/28/22 0509  NA 132* 131*  K 3.7 3.7  CL 98 96*  CO2 25 25  GLUCOSE 143* 128*  BUN 17 15  CREATININE 0.67 0.78  CALCIUM 7.6* 7.8*   PT/INR No results for input(s): "LABPROT", "INR" in the last 72 hours. ABG No results for input(s): "PHART", "HCO3" in the last 72 hours.  Invalid input(s): "PCO2", "PO2"  Studies/Results: DG Chest Port 1 View  Result Date: 10/27/2022 CLINICAL DATA:  Fever EXAM: PORTABLE CHEST 1 VIEW COMPARISON:  06/12/2021 FINDINGS: Transverse diameter of heart is increased. There are no signs of pulmonary edema or focal pulmonary consolidation. There is no significant pleural effusion or pneumothorax. IMPRESSION: Cardiomegaly. There are no signs of pulmonary edema or focal pulmonary consolidation. Electronically Signed   By: Ernie Avena M.D.   On: 10/27/2022 16:18     Anti-infectives: Anti-infectives (From admission, onward)    Start     Dose/Rate Route Frequency Ordered Stop   10/28/22 1000  azithromycin (ZITHROMAX) tablet 250 mg  Status:  Discontinued       See Hyperspace for full Linked Orders Report.   250 mg Oral Daily 10/27/22 0738 10/27/22 1653   10/27/22 1700  piperacillin-tazobactam (ZOSYN) IVPB 3.375 g        3.375 g 12.5 mL/hr over 240 Minutes Intravenous Every 8 hours 10/27/22 1653     10/27/22 1000  azithromycin (ZITHROMAX) tablet 500 mg       See Hyperspace for full Linked Orders Report.   500 mg Oral Daily 10/27/22 0738 10/27/22 0946   10/23/22 1054  ceFAZolin (ANCEF) 2-4 GM/100ML-% IVPB       Note to Pharmacy: Augustine Radar R: cabinet override      10/23/22 1054 10/23/22 2259   10/23/22 0600  ceFAZolin (ANCEF) IVPB 3g/100 mL premix        3 g 200 mL/hr over 30 Minutes Intravenous On call to O.R. 10/23/22 0540 10/23/22 1213       Assessment/Plan: s/p Procedure(s): ROBOTIC RECURRENT INCISIONAL HERNIA REPAIR WITH MESH WITH BILATERAL TRANVERSUS ABDOMINUS MYOFACIAL RELEASE AND BILATERAL POSTERIOR RECTUS MYOFACIAL RELEASE (N/A) Fevers: started on Zosyn for pos UA, no culture results pending Regular diet,  Norco prn Increase activity slowly and use incentive spirometer better today Pain and muscle relaxant medication  Plan for discharge Monday  LOS: 6 days    Kaitlyn Bray 10/29/2022

## 2022-10-29 NOTE — H&P (Signed)
NAME:  Kaitlyn Bray, MRN:  [id number], DOB:  09/30/1965, LOS: 6 ADMISSION DATE:  10/23/2022, CONSULTATION DATE:  6/16 REFERRING MD:  Cornett- surgery, CHIEF COMPLAINT:  post-op vent   History of Present Illness:  Kaitlyn Bray is a 57 y/o woman with a history of a ventral hernia with recent robotic incisional hernia repair on 6/10 who had been progressing well until she developed fever and drainage of foul-smelling fluid from her lower abdominal incision today. She returned to the OR for exploratory ex-lap this afternoon and was found to have bowel contents throughout her abdomen. She required excision of the mesh and identification of her site of perforation, which was repaired then resected with end-to-end anastamosis. Her abdomen was unable to be closed and despite extensive irrigation, she will require additional washout. She had cultures collected intraoperatively. Zosyn was given in the OR.  She was brought to the ICU intubated, sedated, paralyzed post-op. She will remain intubated until she is able to return to the OR to be closed.   She has a complicated abdominal surgery history with previous hysterectomy, multiple incisional hernias in the past. She has had post-op ileus complicating several previous surgeries.   Pertinent  Medical History  Obesity Diabetes Hypertension  Significant Hospital Events: Including procedures, antibiotic start and stop dates in addition to other pertinent events   6/10 hernia repair with mesh 6/16 return to operating room for ex lap, partial small bowel resection with reanastomosis, abdomen left open with ABThera  Interim History / Subjective:    Objective   Blood pressure (!) 146/78, pulse (!) 113, temperature 98.3 F (36.8 C), resp. rate (!) 24, height 5\' 5"  (1.651 m), weight 124.7 kg, SpO2 95 %.        Intake/Output Summary (Last 24 hours) at 10/29/2022 1947 Last data filed at 10/29/2022 1831 Gross per 24 hour  Intake 1849.99 ml  Output 50 ml   Net 1799.99 ml   Filed Weights   10/23/22 0626  Weight: 124.7 kg    Examination: General: critically ill-appearing woman lying in bed no acute distress HENT: [place]/AT, eyes anicteric, pinpoint pupils Lungs: Breathing comfortably on mechanical ventilation, rhonchi bilaterally Cardiovascular: S1-S2, tachycardic, regular rhythm Abdomen: Soft, ventral incision open with wound VAC, minimal blood-tinged drainage Extremities: No cyanosis or edema Neuro: RASS -5, pinpoint pupils GU: Foley with clear yellow urine  Resolved Hospital Problem list     Assessment & Plan:  Peritonitis due to bowel perforation post hernia repair, status post ex lap with resection and reanastomosis of small bowel on 6/16 - Abdomen left open, appreciate surgery postop management - PICC line placement ordered - Bowel rest - Appropriate analgesia postop  Postop vent management - Low tidal volume ventilation - VAP prevention protocol - PAD protocol for sedation - Daily SAT and SBT once appropriate, not planning to extubate until after her abdomen is closed  Diabetes with hyperglycemia - Sliding scale insulin as needed - Goal blood glucose 140-180 -Con't holding PTA metformin  Hyponatremia - Avoid hypotonic fluids - Monitor  Anemia, postop - Transfuse for hemoglobin less than 7 or hemodynamically significant bleeding -monitor  Post-op pain control -hold robaxin, gabapentin, tramadol while on bowel rest. Can resume when bowel cleared by surgery.   Allergic rhinitis -hold claritin -can continue flonase when able  Best Practice (right click and "Reselect all SmartList Selections" daily)   Diet/type: NPO DVT prophylaxis: LMWH GI prophylaxis: PPI Lines: N/A Foley:  Yes, and it is still needed Code Status:  full  code Last date of multidisciplinary goals of care discussion [ ]   Labs   CBC: Recent Labs  Lab 10/24/22 0446 10/25/22 0427 10/26/22 0453 10/27/22 0432 10/28/22 0509  WBC 14.3*  13.2* 11.7* 13.7* 12.6*  HGB 12.0 11.6* 11.4* 11.0* 10.5*  HCT 38.5 37.5 35.7* 34.7* 33.7*  MCV 89.3 89.7 88.8 88.5 88.9  PLT 245 218 213 237 259    Basic Metabolic Panel: Recent Labs  Lab 10/24/22 0446 10/25/22 0427 10/26/22 0453 10/27/22 0432 10/28/22 0509  NA 137 133* 132* 132* 131*  K 3.5 3.7 3.9 3.7 3.7  CL 103 99 97* 98 96*  CO2 25 25 26 25 25   GLUCOSE 135* 177* 153* 143* 128*  BUN 15 14 14 17 15   CREATININE 0.62 0.76 0.70 0.67 0.78  CALCIUM 8.1* 7.7* 7.6* 7.6* 7.8*   GFR: Estimated Creatinine Clearance: 104.2 mL/min (by C-G formula based on SCr of 0.78 mg/dL). Recent Labs  Lab 10/25/22 0427 10/26/22 0453 10/27/22 0432 10/28/22 0509  WBC 13.2* 11.7* 13.7* 12.6*    Liver Function Tests: No results for input(s): "AST", "ALT", "ALKPHOS", "BILITOT", "PROT", "ALBUMIN" in the last 168 hours. No results for input(s): "LIPASE", "AMYLASE" in the last 168 hours. No results for input(s): "AMMONIA" in the last 168 hours.  ABG    Component Value Date/Time   TCO2 24 11/23/2015 1255     Coagulation Profile: No results for input(s): "INR", "PROTIME" in the last 168 hours.  Cardiac Enzymes: No results for input(s): "CKTOTAL", "CKMB", "CKMBINDEX", "TROPONINI" in the last 168 hours.  HbA1C: Hgb A1c MFr Bld  Date/Time Value Ref Range Status  10/16/2022 02:37 PM 8.5 (H) 4.8 - 5.6 % Final    Comment:    (NOTE)         Prediabetes: 5.7 - 6.4         Diabetes: >6.4         Glycemic control for adults with diabetes: <7.0   09/15/2022 12:20 PM 9.8 (H) 4.6 - 6.5 % Final    Comment:    Glycemic Control Guidelines for People with Diabetes:Non Diabetic:  <6%Goal of Therapy: <7%Additional Action Suggested:  >8%     CBG: Recent Labs  Lab 10/28/22 2117 10/29/22 0733 10/29/22 1127 10/29/22 1701 10/29/22 1734  GLUCAP 145* 125* 129* 128* 108*    Review of Systems:   Unable to be obtained due to mental status.  Past Medical History:  She,  has a past medical history  of Asthma, Complication of anesthesia, Heart murmur, Hernia, Hypertension, New onset type 2 diabetes mellitus (HCC) (09/15/2022), PONV (postoperative nausea and vomiting), Pre-diabetes, S/P hernia repair (08/26/2021), and SBO (small bowel obstruction) (HCC) (09/15/2021).   Surgical History:   Past Surgical History:  Procedure Laterality Date   ABDOMINAL HYSTERECTOMY     one ovary stlil in place. removed uterus cervix and pt thinks fallopian tubes   APPENDECTOMY     CESAREAN SECTION     INCISIONAL HERNIA REPAIR N/A 08/26/2021   Procedure: INCISIONAL HERNIA REPAIR;  Surgeon: [name], MD;  Location: [facility] OR;  Service: General;  Laterality: N/A;   INSERTION OF MESH N/A 08/26/2021   Procedure: INSERTION OF MESH;  Surgeon: [name], MD;  Location: [facility] OR;  Service: General;  Laterality: N/A;   LAPAROTOMY N/A 07/29/2020   Procedure: EXPLORATORY LAPAROTOMY, REPAIR OF VENTRAL HERNIA;  Surgeon: [name], MD;  Location: [facility] OR;  Service: General;  Laterality: N/A;   MYOMECTOMY     XI ROBOTIC ASSISTED VENTRAL  HERNIA N/A 10/23/2022   Procedure: ROBOTIC RECURRENT INCISIONAL HERNIA REPAIR WITH MESH WITH BILATERAL TRANVERSUS ABDOMINUS MYOFACIAL RELEASE AND BILATERAL POSTERIOR RECTUS MYOFACIAL RELEASE;  Surgeon: [name], MD;  Location: [facility] ORS;  Service: General;  Laterality: N/A;     Social History:   reports that she has never smoked. She has never used smokeless tobacco. She reports that she does not drink alcohol and does not use drugs.   Family History:  Her family history includes Alzheimer's disease in her maternal grandfather; COPD in her father; Dementia in her mother; Heart disease in her maternal grandfather, maternal grandmother, and mother; Hyperlipidemia in her mother; Hypertension in her mother; Mental illness in her father; Rheum arthritis in her father; Stroke in her mother; Uterine cancer in her paternal grandmother.   Allergies Allergies  Allergen Reactions    Codeine Other (See Comments)    Fast heart rate   Latex Other (See Comments)    Redness from bandaids   Penicillins Rash   Sulfa Drugs Cross Reactors Rash     Home Medications  Prior to Admission medications   Medication Sig Start Date End Date Taking? Authorizing Provider  Ascorbic Acid (VITAMIN C PO) Take 1 capsule by mouth daily.   Yes [provider]  Cyanocobalamin (B-12 PO) Take 1 tablet by mouth daily.   Yes [provider]  ibuprofen (ADVIL) 200 MG tablet Take 200 mg by mouth every 8 (eight) hours as needed for headache or moderate pain.   Yes [provider]  losartan-hydrochlorothiazide (HYZAAR) 50-12.5 MG tablet TAKE 1 TABLET BY MOUTH EVERY DAY 09/20/22  Yes Dugal, Tabitha, FNP  metFORMIN (GLUCOPHAGE-XR) 500 MG 24 hr tablet Take 1 tablet (500 mg total) by mouth 2 (two) times daily. Take two tablets twice daily Patient taking differently: Take 1,000 mg by mouth 2 (two) times daily with a meal. 10/10/22  Yes Dugal, Tabitha, FNP  Multiple Vitamins-Minerals (ZINC PO) Take 1 tablet by mouth daily.   Yes [provider]  VITAMIN D PO Take 1 capsule by mouth daily.   Yes [provider]  albuterol (VENTOLIN HFA) 108 (90 Base) MCG/ACT inhaler Inhale 2 puffs into the lungs every 6 (six) hours as needed for wheezing or shortness of breath.    [provider]  glucose blood test strip Use as instructed 10/03/22   Mort Sawyers, FNP  meclizine (ANTIVERT) 25 MG tablet Take 25 mg by mouth 3 (three) times daily as needed for dizziness.    [provider]     Critical care time: 45 min.     Steffanie Dunn, DO 10/29/22 8:16 PM [facility]  For contact information, see Amion. If no response to pager, please call PCCM consult pager. After hours, 7PM- 7AM, please call Elink.

## 2022-10-29 NOTE — Anesthesia Postprocedure Evaluation (Signed)
Anesthesia Post Note  Patient: Kaitlyn Bray  Procedure(s) Performed: EXPLORATORY LAPAROTOMY     Patient location during evaluation: PACU Anesthesia Type: General Level of consciousness: patient remains intubated per anesthesia plan Pain management: pain level controlled Vital Signs Assessment: post-procedure vital signs reviewed and stable Respiratory status: patient remains intubated per anesthesia plan and respiratory function unstable Cardiovascular status: blood pressure returned to baseline and stable Postop Assessment: no apparent nausea or vomiting Anesthetic complications: no Comments: Patient left intubated at the end of the procedure due to need for multiple bring backs to OR.    No notable events documented.                   Kaitlyn Bray A.

## 2022-10-29 NOTE — Anesthesia Procedure Notes (Signed)
Procedure Name: Intubation Date/Time: 10/29/2022 5:51 PM  Performed by: Epimenio Sarin, CRNAPre-anesthesia Checklist: Patient identified, Emergency Drugs available, Suction available, Patient being monitored and Timeout performed Patient Re-evaluated:Patient Re-evaluated prior to induction Oxygen Delivery Method: Circle system utilized Preoxygenation: Pre-oxygenation with 100% oxygen Induction Type: IV induction, Rapid sequence and Cricoid Pressure applied Ventilation: Mask ventilation without difficulty Laryngoscope Size: Mac and 3 Grade View: Grade I Tube type: Oral Tube size: 7.0 mm Number of attempts: 1 Airway Equipment and Method: Stylet Placement Confirmation: ETT inserted through vocal cords under direct vision, positive ETCO2 and breath sounds checked- equal and bilateral Secured at: 21 cm Tube secured with: Tape Dental Injury: Teeth and Oropharynx as per pre-operative assessment

## 2022-10-29 NOTE — Anesthesia Preprocedure Evaluation (Addendum)
Anesthesia Evaluation  Patient identified by MRN, date of birth, ID band Patient awake    Reviewed: Allergy & Precautions, NPO status , Patient's Chart, lab work & pertinent test results, reviewed documented beta blocker date and time   History of Anesthesia Complications (+) PONV and history of anesthetic complications  Airway Mallampati: III  TM Distance: >3 FB Neck ROM: Full    Dental  (+) Teeth Intact, Dental Advisory Given, Poor Dentition, Missing,    Pulmonary asthma    breath sounds clear to auscultation + decreased breath sounds      Cardiovascular hypertension, Pt. on medications Normal cardiovascular exam+ Valvular Problems/Murmurs  Rhythm:Regular Rate:Normal   Echo 07/30/20: EF 60-65%, normal diastolic function, normal RVSF, mild AV sclerosis w/o stenosis   Neuro/Psych negative neurological ROS  negative psych ROS   GI/Hepatic negative GI ROS, Neg liver ROS,,,  Endo/Other  diabetes, Poorly Controlled, Type 2, Oral Hypoglycemic Agents  Morbid obesityHyperlipidemia  Renal/GU negative Renal ROS  negative genitourinary   Musculoskeletal negative musculoskeletal ROS (+)  Wound dehiscience   Abdominal  (+) + obese Abdomen: tender.   Peds  Hematology  (+) Blood dyscrasia, anemia   Anesthesia Other Findings Last po 3pm  Reproductive/Obstetrics                             Anesthesia Physical Anesthesia Plan  ASA: 3 and emergent  Anesthesia Plan: General   Post-op Pain Management: Dilaudid IV, Ketamine IV* and Ofirmev IV (intra-op)*   Induction: Intravenous, Rapid sequence and Cricoid pressure planned  PONV Risk Score and Plan: 4 or greater and Ondansetron, Dexamethasone, Treatment may vary due to age or medical condition, Midazolam and Scopolamine patch - Pre-op  Airway Management Planned: Oral ETT  Additional Equipment: None  Intra-op Plan:   Post-operative Plan: Extubation  in OR and Possible Post-op intubation/ventilation  Informed Consent: I have reviewed the patients History and Physical, chart, labs and discussed the procedure including the risks, benefits and alternatives for the proposed anesthesia with the patient or authorized representative who has indicated his/her understanding and acceptance.     Dental advisory given  Plan Discussed with: Anesthesiologist and CRNA  Anesthesia Plan Comments: (DISCUSSION:56 y.o. never smoker with h/o PONV, HTN, DM II, recurrent ventral hernia scheduled for above procedure 10/23/2022 with Dr. Ivar Drape.    Pt last seen by PCP 10/10/2022. Metformin increased at this visit. A1C 8.5 down from 9.3 on 09/15/2022. Labs forwarded to Dr. Dossie Der.    Echo 07/30/2020 with EF 60-65%, mild AV sclerosis without stenosis.  )        Anesthesia Quick Evaluation

## 2022-10-29 NOTE — Progress Notes (Signed)
PHARMACY NOTE  Pharmacy consulted to assist with dosing of Zosyn for intra-abdominal infection.  Zosyn already ordered by MD, and dosage remains stable at 3.375g IV q8h (each dose infused over 4 hours). Need for further dosage adjustment appears unlikely at present.    Will sign off at this time.  Please reconsult if a change in clinical status warrants re-evaluation of dosage.   Greer Pickerel, PharmD, BCPS Clinical Pharmacist 10/29/2022 8:27 PM

## 2022-10-29 NOTE — Progress Notes (Signed)
Pt's abd remains large, distended and firm to touch. Pt states passing gas but no BM thus far. Enc pt to cont walking, be OOB and given hot coffee to facilitate return of bowel functions. Noted old midline abd incision w ?? seroma (fluid filled area over scar). Skin remains intact to this area, new ports sites are unremarkable.

## 2022-10-29 NOTE — Progress Notes (Signed)
2 RT attempted to obtain Aline but were unsuccessful. RN aware.

## 2022-10-29 NOTE — Progress Notes (Signed)
Mobility Specialist - Progress Note   10/29/22 0930  Mobility  Activity Ambulated with assistance in hallway  Level of Assistance Modified independent, requires aide device or extra time  Assistive Device Other (Comment) (IV Pole)  Distance Ambulated (ft) 500 ft  Activity Response Tolerated well  Mobility Referral Yes  $Mobility charge 1 Mobility  Mobility Specialist Start Time (ACUTE ONLY) 0920  Mobility Specialist Stop Time (ACUTE ONLY) 0929  Mobility Specialist Time Calculation (min) (ACUTE ONLY) 9 min   Pt received in room standing and agreeable to mobility. C/o soreness near incisions. Nurse made aware. No other complaints during session. Pt to EOB after session with all needs met. Nurse in room.    [facility]

## 2022-10-29 NOTE — Progress Notes (Addendum)
Called to pt's rm urgently, husband states pt is bleeding in the bathroom. Found pt sitting on the toilet w reddish brown, foul smelling liquid spewing from previous area where seroma was. Pt alert, w/d, assisted back to bed, pressure applied to sm area in mid-abdomen where this fluid cont to forcefully expel. Multiple pressure dressings applied w continuation of the spewing/saturation of abd pads. MD stat paged and pt sent urgently to surgery.

## 2022-10-29 NOTE — Progress Notes (Signed)
Called by nurses due to patient's with a large amount of fluid that burst through her skin.  She went to the bathroom to sit on the commode and had a large gush of fluid from her abdominal wall.  This was copious and extremely foul-smelling.  They put the patient to bed.  She now has significant drainage of this dark brown liquid from her abdominal wall.  Could be a possible infected seroma from her hernia repair or bowel injury given the complexity of the repair.  She is hemodynamically stable.  Upon examination there is still copious amounts of foul-smelling fluid that is brown in nature concerning for possible bowel injury.  I discussed this could also be an infected seroma from her large robotic hernia repair but recommend exploratory laparotomy for assessment of her situation and to determine if there is a bowel injury or not.  I do not feel CT scan would add much given the amount of fluid draining from abdominal wall which is quite copious and foul-smelling.  Either way I think exploration is necessary to at least wash this out and to evaluate for any signs of bowel injury or fistula formation.  She is quite obese and has multiple medical problems.  I discussed the possibility of open surgery and doing a bowel resection if necessary.  Discussed possible colon resection as well.  Given her body habitus, this there could be a significant issue with closing her abdomen since this was quite difficult and she has a bridged hernia repair with mesh.  The mesh may need to be removed.  I went through all of these options and possibilities with her.  She voiced understanding of the need to go to surgery and agreed to proceed.

## 2022-10-30 ENCOUNTER — Encounter (HOSPITAL_COMMUNITY): Payer: Self-pay | Admitting: Surgery

## 2022-10-30 DIAGNOSIS — K432 Incisional hernia without obstruction or gangrene: Secondary | ICD-10-CM | POA: Diagnosis not present

## 2022-10-30 LAB — BASIC METABOLIC PANEL
Anion gap: 11 (ref 5–15)
Anion gap: 12 (ref 5–15)
BUN: 10 mg/dL (ref 6–20)
BUN: 14 mg/dL (ref 6–20)
CO2: 22 mmol/L (ref 22–32)
CO2: 24 mmol/L (ref 22–32)
Calcium: 7.5 mg/dL — ABNORMAL LOW (ref 8.9–10.3)
Calcium: 7.7 mg/dL — ABNORMAL LOW (ref 8.9–10.3)
Chloride: 99 mmol/L (ref 98–111)
Chloride: 99 mmol/L (ref 98–111)
Creatinine, Ser: 0.66 mg/dL (ref 0.44–1.00)
Creatinine, Ser: 0.78 mg/dL (ref 0.44–1.00)
GFR, Estimated: >60 mL/min (ref >60)
GFR, Estimated: >60 mL/min (ref >60)
Glucose, Bld: 185 mg/dL — ABNORMAL HIGH (ref 70–99)
Glucose, Bld: 140 mg/dL — ABNORMAL HIGH (ref 70–99)
Potassium: 5.4 mmol/L — ABNORMAL HIGH (ref 3.5–5.1)
Potassium: 4.6 mmol/L (ref 3.5–5.1)
Sodium: 132 mmol/L — ABNORMAL LOW (ref 135–145)
Sodium: 135 mmol/L (ref 135–145)

## 2022-10-30 LAB — GLUCOSE, CAPILLARY
Glucose-Capillary: 122 mg/dL — ABNORMAL HIGH (ref 70–99)
Glucose-Capillary: 129 mg/dL — ABNORMAL HIGH (ref 70–99)
Glucose-Capillary: 139 mg/dL — ABNORMAL HIGH (ref 70–99)
Glucose-Capillary: 160 mg/dL — ABNORMAL HIGH (ref 70–99)
Glucose-Capillary: 170 mg/dL — ABNORMAL HIGH (ref 70–99)
Glucose-Capillary: 179 mg/dL — ABNORMAL HIGH (ref 70–99)

## 2022-10-30 LAB — CBC
HCT: 32.5 % — ABNORMAL LOW (ref 36.0–46.0)
HCT: 38.2 % (ref 36.0–46.0)
Hemoglobin: 10.1 g/dL — ABNORMAL LOW (ref 12.0–15.0)
Hemoglobin: 11.8 g/dL — ABNORMAL LOW (ref 12.0–15.0)
MCH: 27.9 pg (ref 26.0–34.0)
MCH: 28 pg (ref 26.0–34.0)
MCHC: 31.1 g/dL (ref 30.0–36.0)
MCHC: 30.9 g/dL (ref 30.0–36.0)
MCV: 89.8 fL (ref 80.0–100.0)
MCV: 90.5 fL (ref 80.0–100.0)
Platelets: 334 10*3/uL (ref 150–400)
Platelets: 365 10*3/uL (ref 150–400)
RBC: 3.62 MIL/uL — ABNORMAL LOW (ref 3.87–5.11)
RBC: 4.22 MIL/uL (ref 3.87–5.11)
RDW: 14.3 % (ref 11.5–15.5)
RDW: 14.4 % (ref 11.5–15.5)
WBC: 12.9 10*3/uL — ABNORMAL HIGH (ref 4.0–10.5)
WBC: 19.3 10*3/uL — ABNORMAL HIGH (ref 4.0–10.5)
nRBC: 0 % (ref 0.0–0.2)
nRBC: 0 % (ref 0.0–0.2)

## 2022-10-30 LAB — COMPREHENSIVE METABOLIC PANEL
ALT: 22 U/L (ref 0–44)
AST: 27 U/L (ref 15–41)
Albumin: 1.8 g/dL — ABNORMAL LOW (ref 3.5–5.0)
Alkaline Phosphatase: 82 U/L (ref 38–126)
Anion gap: 8 (ref 5–15)
BUN: 10 mg/dL (ref 6–20)
CO2: 26 mmol/L (ref 22–32)
Calcium: 7.6 mg/dL — ABNORMAL LOW (ref 8.9–10.3)
Chloride: 100 mmol/L (ref 98–111)
Creatinine, Ser: 0.67 mg/dL (ref 0.44–1.00)
GFR, Estimated: >60 mL/min (ref >60)
Glucose, Bld: 188 mg/dL — ABNORMAL HIGH (ref 70–99)
Potassium: 4.7 mmol/L (ref 3.5–5.1)
Sodium: 134 mmol/L — ABNORMAL LOW (ref 135–145)
Total Bilirubin: 0.8 mg/dL (ref 0.3–1.2)
Total Protein: 5.6 g/dL — ABNORMAL LOW (ref 6.5–8.1)

## 2022-10-30 LAB — MAGNESIUM
Magnesium: 2.1 mg/dL (ref 1.7–2.4)
Magnesium: 2.1 mg/dL (ref 1.7–2.4)

## 2022-10-30 LAB — TRIGLYCERIDES: Triglycerides: 169 mg/dL — ABNORMAL HIGH (ref <150)

## 2022-10-30 LAB — AEROBIC/ANAEROBIC CULTURE W GRAM STAIN (SURGICAL/DEEP WOUND)

## 2022-10-30 LAB — PHOSPHORUS
Phosphorus: 4.3 mg/dL (ref 2.5–4.6)
Phosphorus: 3.9 mg/dL (ref 2.5–4.6)

## 2022-10-30 LAB — HIV ANTIBODY (ROUTINE TESTING W REFLEX): HIV Screen 4th Generation wRfx: NONREACTIVE

## 2022-10-30 MED ORDER — LACTATED RINGERS IV BOLUS
1000.0000 mL | Freq: Once | INTRAVENOUS | Status: AC
  Administered 2022-10-30: 1000 mL via INTRAVENOUS

## 2022-10-30 MED ORDER — LACTATED RINGERS IV BOLUS
500.0000 mL | Freq: Once | INTRAVENOUS | Status: AC
  Administered 2022-10-30: 500 mL via INTRAVENOUS

## 2022-10-30 MED ORDER — SODIUM CHLORIDE 0.9 % IV SOLN: 250.0000 mL | INTRAVENOUS | Status: DC

## 2022-10-30 MED ORDER — SODIUM CHLORIDE 0.9% FLUSH
10.0000 mL | Freq: Two times a day (BID) | INTRAVENOUS | Status: DC
  Administered 2022-10-30: 30 mL
  Administered 2022-10-31 – 2022-11-09 (×15): 10 mL
  Administered 2022-11-09: 20 mL
  Administered 2022-11-10 – 2022-11-11 (×2): 10 mL
  Administered 2022-11-12: 40 mL
  Administered 2022-11-13 – 2022-11-15 (×6): 10 mL

## 2022-10-30 MED ORDER — SODIUM CHLORIDE 0.9% FLUSH: 10.0000 mL | INTRAVENOUS | Status: DC | PRN

## 2022-10-30 MED ORDER — ORAL CARE MOUTH RINSE
15.0000 mL | OROMUCOSAL | Status: DC
  Administered 2022-10-30 – 2022-11-09 (×121): 15 mL via OROMUCOSAL

## 2022-10-30 MED ORDER — MIDAZOLAM-SODIUM CHLORIDE 100-0.9 MG/100ML-% IV SOLN
0.0000 mg/h | INTRAVENOUS | Status: DC
  Administered 2022-10-30: 2 mg/h via INTRAVENOUS
  Administered 2022-10-31 – 2022-11-04 (×4): 3 mg/h via INTRAVENOUS
  Administered 2022-11-06: 2 mg/h via INTRAVENOUS
  Filled 2022-10-30 (×6): qty 100

## 2022-10-30 MED ORDER — NOREPINEPHRINE 4 MG/250ML-% IV SOLN
2.0000 ug/min | INTRAVENOUS | Status: DC
  Administered 2022-10-30: 6 ug/min via INTRAVENOUS
  Administered 2022-10-30: 5 ug/min via INTRAVENOUS
  Administered 2022-10-30: 8 ug/min via INTRAVENOUS
  Filled 2022-10-30: qty 500

## 2022-10-30 MED ORDER — ORAL CARE MOUTH RINSE: 15.0000 mL | OROMUCOSAL | Status: DC | PRN

## 2022-10-30 MED ORDER — MIDAZOLAM BOLUS VIA INFUSION
0.0000 mg | INTRAVENOUS | Status: DC | PRN
  Administered 2022-10-30: 3 mg via INTRAVENOUS
  Administered 2022-10-30 (×2): 2 mg via INTRAVENOUS
  Administered 2022-10-31 – 2022-11-01 (×4): 3 mg via INTRAVENOUS
  Administered 2022-11-02 (×2): 2 mg via INTRAVENOUS
  Administered 2022-11-02: 3 mg via INTRAVENOUS
  Administered 2022-11-03 – 2022-11-04 (×2): 1 mg via INTRAVENOUS
  Administered 2022-11-06: 2 mg via INTRAVENOUS

## 2022-10-30 MED ORDER — ACETAMINOPHEN 650 MG RE SUPP
650.0000 mg | Freq: Four times a day (QID) | RECTAL | Status: DC | PRN
  Administered 2022-10-30 – 2022-11-04 (×4): 650 mg via RECTAL
  Filled 2022-10-30 (×4): qty 1

## 2022-10-30 NOTE — Progress Notes (Signed)
Peripherally Inserted Central Catheter Placement  The IV Nurse has discussed with the patient and/or persons authorized to consent for the patient, the purpose of this procedure and the potential benefits and risks involved with this procedure.  The benefits include less needle sticks, lab draws from the catheter, and the patient may be discharged home with the catheter. Risks include, but not limited to, infection, bleeding, blood clot (thrombus formation), and puncture of an artery; nerve damage and irregular heartbeat and possibility to perform a PICC exchange if needed/ordered by physician.  Alternatives to this procedure were also discussed.  Bard Power PICC patient education guide, fact sheet on infection prevention and patient information card has been provided to patient /or left at bedside.  Consent signed by husband at bedside due to altered mental status.  PICC Placement Documentation  PICC Triple Lumen 10/30/22 Right Brachial 37 cm 0 cm (Active)  Indication for Insertion or Continuance of Line Vasoactive infusions;Prolonged intravenous therapies 10/30/22 1531  Exposed Catheter (cm) 0 cm 10/30/22 1531  Site Assessment Clean, Dry, Intact 10/30/22 1531  Lumen #1 Status Flushed;Saline locked;Blood return noted 10/30/22 1531  Lumen #2 Status Flushed;Saline locked;Blood return noted 10/30/22 1531  Lumen #3 Status Flushed;Saline locked;Blood return noted 10/30/22 1531  Dressing Type Transparent;Securing device 10/30/22 1531  Dressing Status Antimicrobial disc in place;Clean, Dry, Intact 10/30/22 1531  Safety Lock Not Applicable 10/30/22 1531  Line Care Connections checked and tightened 10/30/22 1531  Line Adjustment (NICU/IV Team Only) No 10/30/22 1531  Dressing Intervention New dressing 10/30/22 1531  Dressing Change Due 11/06/22 10/30/22 1531       Charli Liberatore, Lajean Manes 10/30/2022, 3:32 PM

## 2022-10-30 NOTE — Progress Notes (Signed)
No family at bedside, called husband number at [phone] with no answer.

## 2022-10-30 NOTE — Progress Notes (Signed)
Initial Nutrition Assessment  DOCUMENTATION CODES:   Morbid obesity  INTERVENTION:  - NGT to LIS at this time.   - Once medically appropriate, would recommend starting tube feeds: Vital 1.5 at 50 ml/h (1200 ml per day) Prosource TF20 60 ml TID Provides 2040 kcal, 141 gm protein, 916 ml free water daily - FWF per CCM/MD.   - Will monitor for nutrition plans.   NUTRITION DIAGNOSIS:   Inadequate oral intake related to inability to eat as evidenced by NPO status.  GOAL:   Patient will meet greater than or equal to 90% of their needs  MONITOR:   Vent status, Labs, Weight trends  REASON FOR ASSESSMENT:   Ventilator    ASSESSMENT:   57 y.o. female with PMH HTN and Diabetes who presented for robotic hernia repair now complicated by bowel perforation.   6/10 Admit, s/p hernia repair with lysis of adhesions 6/16 patient went to bathroom and had large amount of foul smelling fluid gush from abdominal wall; taken to surgery emergently -> found to have small bowel injury with perforation and contamination of abdominal cavity s/p ex-lap, resection of small bowel perf, ABThera wound VAC placed with open abdomen   Patient intubated and sedated at time of visit this AM. No family at bedside.   Per chart review patient eating 50-100% of meals prior to surgery and intubation yesterday.   NGT placed, per xray tip in proximal stomach with sidehole at GE junction, advancement of 5cm recommended. Per RN, CCM overnight okay with leaving in current spot. NGT remains to LIS at this time.  Per Surgery note today, plan for return to OR tomorrow for washout. Patient may require multiple washouts.   Medications reviewed and include: Insulin, Miralax Fentanyl Levophed  Labs reviewed:  Na 132 K+ 5.4 HA1C 8.5 Blood Glucose 108-179 x24 hours   NUTRITION - FOCUSED PHYSICAL EXAM:  No depletions  Diet Order:   Diet Order             Diet NPO time specified  Diet effective now                    EDUCATION NEEDS:  Not appropriate for education at this time  Skin:  Skin Assessment: Skin Integrity Issues: Skin Integrity Issues:: Incisions Incisions: Abdomen  Last BM:  6/15  Height:  Ht Readings from Last 1 Encounters:  10/29/22 5\' 5"  (1.651 m)   Weight:  Wt Readings from Last 1 Encounters:  10/30/22 114.5 kg   Ideal Body Weight:  56.82 kg  BMI:  Body mass index is 42.01 kg/m.  Estimated Nutritional Needs:  Kcal:  1900-2200 kcals ([employer]: 2017 kcals) Protein:  115-145 grams Fluid:  >/= 1.9L    Shelle Iron RD, LDN For contact information, refer to [facility].

## 2022-10-30 NOTE — Progress Notes (Signed)
Second attempt to reach husband and son unanswered.  Will attempt again later this afternoon.

## 2022-10-30 NOTE — Progress Notes (Signed)
NAME:  Kaitlyn Bray, MRN:  [id number], DOB:  05/04/1966, LOS: 7 ADMISSION DATE:  10/23/2022, CONSULTATION DATE:  6/16 REFERRING MD:  Cornett- surgery, CHIEF COMPLAINT:  post-op vent   History of Present Illness:  [name] is a 57 y/o woman with a history of a ventral hernia with recent robotic incisional hernia repair on 6/10 who had been progressing well until she developed fever and drainage of foul-smelling fluid from her lower abdominal incision today. She returned to the OR for exploratory ex-lap this afternoon and was found to have bowel contents throughout her abdomen. She required excision of the mesh and identification of her site of perforation, which was repaired then resected with end-to-end anastamosis. Her abdomen was unable to be closed and despite extensive irrigation, she will require additional washout. She had cultures collected intraoperatively. Zosyn was given in the OR.  She was brought to the ICU intubated, sedated, paralyzed post-op. She will remain intubated until she is able to return to the OR to be closed.   She has a complicated abdominal surgery history with previous hysterectomy, multiple incisional hernias in the past. She has had post-op ileus complicating several previous surgeries.   Pertinent  Medical History  Obesity Diabetes Hypertension  Significant Hospital Events: Including procedures, antibiotic start and stop dates in addition to other pertinent events   6/10 hernia repair with mesh 6/16 return to operating room for ex lap, partial small bowel resection with reanastomosis, abdomen left open with ABThera 6/14 Urine output decreased overnight   Interim History / Subjective:  Deeply sedated on vent   Objective   Blood pressure 107/65, pulse 60, temperature 99.7 F (37.6 C), resp. rate 20, height 5\' 5"  (1.651 m), weight 114.5 kg, SpO2 100 %.    Vent Mode: PRVC FiO2 (%):  [50 %-100 %] 50 % Set Rate:  [20 bmp] 20 bmp Vt Set:  [460 mL] 460  mL PEEP:  [5 cmH20] 5 cmH20 Plateau Pressure:  [16 cmH20-19 cmH20] 19 cmH20   Intake/Output Summary (Last 24 hours) at 10/30/2022 0720 Last data filed at 10/30/2022 0700 Gross per 24 hour  Intake 4483.89 ml  Output 2145 ml  Net 2338.89 ml    Filed Weights   10/23/22 0626 10/29/22 2000 10/30/22 0400  Weight: 124.7 kg 114.5 kg 114.5 kg    Examination: General: Acute on chronically ill appearing elderly  female lying in bed on mechanical ventilation, in NAD HEENT: ETT, MM pink/moist, PERRL,  Neuro: Deeply sedated  CV: s1s2 regular rate and rhythm, no murmur, rubs, or gallops,  PULM:  Clear to ascultation, no increased work of breathing, tolerating vent  GI: soft, bowel sounds hypoactive in all 4 quadrants, wound vac  Extremities: warm/dry, no edema  Skin: no rashes or lesions  Resolved Hospital Problem list     Assessment & Plan:  Peritonitis  -Due to bowel perforation post hernia repair, status post ex lap with resection and reanastomosis of small bowel on 6/16 -Abdomen left open 6/16,  P: Primary management per surgery, appreciate assistance  NPO per surgery  Local wound care  Continue Zosyn  Ensure pain control   Postop vent management P: Continue ventilator support with lung protective strategies  Hold SBT until ABD closed  Wean PEEP and FiO2 for sats greater than 90%. Head of bed elevated 30 degrees. Plateau pressures less than 30 cm H20.  Follow intermittent chest x-ray and ABG.   Ensure adequate pulmonary hygiene  Follow cultures  VAP bundle in place  PAD protocol  Diabetes with hyperglycemia P: Continue SSI  CBG goal 140-180 CBG checks q4 Hold home Metformin   Hyperkalemia  Hyponatremia P: Avoid hypotonic fluids  Trend Bmet   Anemia, postop P: Transfuse per protocol  Hgb goal > 7  Post-op pain control P: PAD protocol as above Hold home Robaxin, Gabapentin, and Tramadol   Allergic rhinitis P: Hold home Claritin and Flonase   Best  Practice (right click and "Reselect all SmartList Selections" daily)   Diet/type: NPO DVT prophylaxis: LMWH GI prophylaxis: PPI Lines: N/A Foley:  Yes, and it is still needed Code Status:  full code Last date of multidisciplinary goals of care discussion Pending   Critical care time:   CRITICAL CARE Performed by: Jonise Weightman D. Harris  Total critical care time: 37 minutes  Critical care time was exclusive of separately billable procedures and treating other patients.  Critical care was necessary to treat or prevent imminent or life-threatening deterioration.  Critical care was time spent personally by me on the following activities: development of treatment plan with patient and/or surrogate as well as nursing, discussions with consultants, evaluation of patient's response to treatment, examination of patient, obtaining history from patient or surrogate, ordering and performing treatments and interventions, ordering and review of laboratory studies, ordering and review of radiographic studies, pulse oximetry and re-evaluation of patient's condition.  Amunique Neyra D. Harris, NP-C [facility] Personal contact information can be found on Amion  If no contact or response made please call [phone] 10/30/2022, 7:28 AM

## 2022-10-30 NOTE — Progress Notes (Signed)
Attempted to call husband and son to obtain consent for PICC.  No answer to either call.  Marijean Niemann, RN updated.

## 2022-10-30 NOTE — Progress Notes (Signed)
Subjective/Chief Complaint: Recovering in ICU after reoperation last night   Objective: Vital signs in last 24 hours: Temp:  [98 F (36.7 C)-101.3 F (38.5 C)] 99.7 F (37.6 C) (06/17 0700) Pulse Rate:  [60-116] 60 (06/17 0700) Resp:  [8-28] 20 (06/17 0700) BP: (68-179)/(45-102) 107/65 (06/17 0700) SpO2:  [93 %-100 %] 100 % (06/17 0700) FiO2 (%):  [50 %-100 %] 50 % (06/17 0405) Weight:  [114.5 kg] 114.5 kg (06/17 0400) Last BM Date : 10/28/22  Intake/Output from previous day: 06/16 0701 - 06/17 0700 In: 4483.9 [P.O.:1320; I.V.:2880.1; IV Piggyback:283.8] Out: 2145 [Urine:1545; Drains:550; Blood:50] Intake/Output this shift: No intake/output data recorded.  General appearance: alert and cooperative Resp: clear to auscultation bilaterally Cardio: NSR  Incision/Wound: ABTHERA with thin fluid drainage, not a significant amount  Lab Results:  Recent Labs    10/29/22 2249 10/30/22 0303  WBC 12.9* 19.3*  HGB 10.1* 11.8*  HCT 32.5* 38.2  PLT 334 365    BMET Recent Labs    10/29/22 2249 10/30/22 0303  NA 134* 132*  K 4.7 5.4*  CL 100 99  CO2 26 22  GLUCOSE 188* 185*  BUN 10 10  CREATININE 0.67 0.66  CALCIUM 7.6* 7.5*    PT/INR No results for input(s): "LABPROT", "INR" in the last 72 hours. ABG Recent Labs    10/29/22 2055  PHART 7.3*  HCO3 27.6    Studies/Results: DG Abd 1 View  Result Date: 10/29/2022 CLINICAL DATA:  OG tube EXAM: ABDOMEN - 1 VIEW COMPARISON:  None Available. FINDINGS: Orogastric tube tip is seen in the proximal stomach with sidehole at the level of the gastroesophageal junction. No dilated bowel loops are seen. There is left body wall soft tissue air. IMPRESSION: 1. Orogastric tube tip is seen in the proximal stomach with sidehole at the level of the gastroesophageal junction. Consider advancing tube 5 cm. 2. Left body wall soft tissue air of uncertain etiology. Please correlate clinically. Electronically Signed   By: Darliss Cheney M.D.    On: 10/29/2022 20:29   DG Chest Port 1 View  Result Date: 10/29/2022 CLINICAL DATA:  Intubated EXAM: PORTABLE CHEST 1 VIEW COMPARISON:  Chest x-ray 10/27/2022 FINDINGS: The endotracheal tube is 1.9 cm above the carina. Enteric tube extends below the diaphragm. The cardiomediastinal silhouette is within normal limits for projection. There is left basilar atelectasis. The lungs are otherwise clear. There is no pleural effusion or pneumothorax. No acute fractures are seen. IMPRESSION: 1. Endotracheal tube 1.9 cm above the carina. 2. Left basilar atelectasis. Electronically Signed   By: Darliss Cheney M.D.   On: 10/29/2022 20:26   [place] EKG SITE RITE  Result Date: 10/29/2022 If Site Rite image not attached, placement could not be confirmed due to current cardiac rhythm.   Anti-infectives: Anti-infectives (From admission, onward)    Start     Dose/Rate Route Frequency Ordered Stop   10/28/22 1000  azithromycin (ZITHROMAX) tablet 250 mg  Status:  Discontinued       See Hyperspace for full Linked Orders Report.   250 mg Oral Daily 10/27/22 0738 10/27/22 1653   10/27/22 1700  piperacillin-tazobactam (ZOSYN) IVPB 3.375 g        3.375 g 12.5 mL/hr over 240 Minutes Intravenous Every 8 hours 10/27/22 1653     10/27/22 1000  azithromycin (ZITHROMAX) tablet 500 mg       See Hyperspace for full Linked Orders Report.   500 mg Oral Daily 10/27/22 0738 10/27/22 0946   10/23/22  1054  ceFAZolin (ANCEF) 2-4 GM/100ML-% IVPB       Note to Pharmacy: Augustine Radar R: cabinet override      10/23/22 1054 10/23/22 2259   10/23/22 0600  ceFAZolin (ANCEF) IVPB 3g/100 mL premix        3 g 200 mL/hr over 30 Minutes Intravenous On call to O.R. 10/23/22 0540 10/23/22 1213       Assessment/Plan: Kaitlyn Bray underwent difficult robotic recurrent incisional hernia repair with mesh, bilateral posterior rectus myofascial release, bilateral transversus abdominis release and removal of previous polypropylene mesh with  prolonged lyses of adhesions on 10/23/22.  This was complicated by bowel perforation, possible missed enterotomy during dissection of small intestine off mesh, requiring return to the operating room with washout, small bowel resection and ABTHERA placement on 10/29/22.  Continue ABTHERA, plan for return to the operating room tomorrow with Dr. Magnus Ivan for washout, may need multiple washouts Appreciate ICU team care Will require a bridging absorbable mesh closure of abdominal wall due to severe retraction of rectus muscualture I unfortunately am out of town this week, but take back over from the LDOW service when I return  Kaitlyn Ore, MD General, Bariatric and Minimally Invasive Surgery [facility]        LOS: 7 days    Kaitlyn Bray [facility] 10/30/2022

## 2022-10-31 ENCOUNTER — Encounter (HOSPITAL_COMMUNITY)
Admission: RE | Disposition: A | Discharge status: Rehabilitation Facility | Payer: Self-pay | Source: Home / Self Care | Attending: Surgery

## 2022-10-31 ENCOUNTER — Other Ambulatory Visit: Payer: Self-pay

## 2022-10-31 ENCOUNTER — Inpatient Hospital Stay (HOSPITAL_COMMUNITY): Payer: 59 | Admitting: Registered Nurse

## 2022-10-31 DIAGNOSIS — K9189 Other postprocedural complications and disorders of digestive system: Secondary | ICD-10-CM | POA: Diagnosis not present

## 2022-10-31 DIAGNOSIS — I10 Essential (primary) hypertension: Secondary | ICD-10-CM

## 2022-10-31 DIAGNOSIS — J45909 Unspecified asthma, uncomplicated: Secondary | ICD-10-CM | POA: Diagnosis not present

## 2022-10-31 DIAGNOSIS — D649 Anemia, unspecified: Secondary | ICD-10-CM | POA: Diagnosis not present

## 2022-10-31 DIAGNOSIS — K432 Incisional hernia without obstruction or gangrene: Secondary | ICD-10-CM | POA: Diagnosis not present

## 2022-10-31 HISTORY — PX: LAPAROTOMY: SHX154

## 2022-10-31 LAB — GLUCOSE, CAPILLARY
Glucose-Capillary: 108 mg/dL — ABNORMAL HIGH (ref 70–99)
Glucose-Capillary: 114 mg/dL — ABNORMAL HIGH (ref 70–99)
Glucose-Capillary: 117 mg/dL — ABNORMAL HIGH (ref 70–99)
Glucose-Capillary: 123 mg/dL — ABNORMAL HIGH (ref 70–99)
Glucose-Capillary: 93 mg/dL (ref 70–99)
Glucose-Capillary: 93 mg/dL (ref 70–99)
Glucose-Capillary: 98 mg/dL (ref 70–99)

## 2022-10-31 LAB — COMPREHENSIVE METABOLIC PANEL
ALT: 15 U/L (ref 0–44)
AST: 14 U/L — ABNORMAL LOW (ref 15–41)
Albumin: 1.5 g/dL — ABNORMAL LOW (ref 3.5–5.0)
Alkaline Phosphatase: 61 U/L (ref 38–126)
Anion gap: 7 (ref 5–15)
BUN: 15 mg/dL (ref 6–20)
CO2: 27 mmol/L (ref 22–32)
Calcium: 7.3 mg/dL — ABNORMAL LOW (ref 8.9–10.3)
Chloride: 102 mmol/L (ref 98–111)
Creatinine, Ser: 0.74 mg/dL (ref 0.44–1.00)
GFR, Estimated: >60 mL/min (ref >60)
Glucose, Bld: 115 mg/dL — ABNORMAL HIGH (ref 70–99)
Potassium: 4.5 mmol/L (ref 3.5–5.1)
Sodium: 136 mmol/L (ref 135–145)
Total Bilirubin: 0.5 mg/dL (ref 0.3–1.2)
Total Protein: 5.3 g/dL — ABNORMAL LOW (ref 6.5–8.1)

## 2022-10-31 LAB — CBC
HCT: 27.5 % — ABNORMAL LOW (ref 36.0–46.0)
Hemoglobin: 8.5 g/dL — ABNORMAL LOW (ref 12.0–15.0)
MCH: 28.1 pg (ref 26.0–34.0)
MCHC: 30.9 g/dL (ref 30.0–36.0)
MCV: 91.1 fL (ref 80.0–100.0)
Platelets: 331 10*3/uL (ref 150–400)
RBC: 3.02 MIL/uL — ABNORMAL LOW (ref 3.87–5.11)
RDW: 14.5 % (ref 11.5–15.5)
WBC: 16.3 10*3/uL — ABNORMAL HIGH (ref 4.0–10.5)
nRBC: 0 % (ref 0.0–0.2)

## 2022-10-31 LAB — HEMOGLOBIN AND HEMATOCRIT, BLOOD
HCT: 27.8 % — ABNORMAL LOW (ref 36.0–46.0)
Hemoglobin: 8.7 g/dL — ABNORMAL LOW (ref 12.0–15.0)

## 2022-10-31 LAB — TYPE AND SCREEN
ABO/RH(D): A POS
Antibody Screen: NEGATIVE

## 2022-10-31 SURGERY — Surgical Case
Anesthesia: *Unknown

## 2022-10-31 SURGERY — LAPAROTOMY, EXPLORATORY
Anesthesia: General

## 2022-10-31 MED ORDER — ONDANSETRON HCL 4 MG/2ML IJ SOLN
INTRAMUSCULAR | Status: AC
  Filled 2022-10-31: qty 2

## 2022-10-31 MED ORDER — BUPIVACAINE HCL 0.25 % IJ SOLN
INTRAMUSCULAR | Status: AC
  Filled 2022-10-31: qty 1

## 2022-10-31 MED ORDER — PHENYLEPHRINE HCL (PRESSORS) 10 MG/ML IV SOLN
INTRAVENOUS | Status: AC
  Filled 2022-10-31: qty 1

## 2022-10-31 MED ORDER — DEXAMETHASONE SODIUM PHOSPHATE 10 MG/ML IJ SOLN
INTRAMUSCULAR | Status: DC | PRN
  Administered 2022-10-31: 4 mg via INTRAVENOUS

## 2022-10-31 MED ORDER — ROCURONIUM BROMIDE 10 MG/ML (PF) SYRINGE
PREFILLED_SYRINGE | INTRAVENOUS | Status: DC | PRN
  Administered 2022-10-31: 50 mg via INTRAVENOUS

## 2022-10-31 MED ORDER — MIDAZOLAM HCL 2 MG/2ML IJ SOLN
INTRAMUSCULAR | Status: AC
  Filled 2022-10-31: qty 2

## 2022-10-31 MED ORDER — SUGAMMADEX SODIUM 200 MG/2ML IV SOLN
INTRAVENOUS | Status: DC | PRN
  Administered 2022-10-31: 250 mg via INTRAVENOUS

## 2022-10-31 MED ORDER — MIDAZOLAM HCL 5 MG/5ML IJ SOLN
INTRAMUSCULAR | Status: DC | PRN
  Administered 2022-10-31: 2 mg via INTRAVENOUS

## 2022-10-31 MED ORDER — 0.9 % SODIUM CHLORIDE (POUR BTL) OPTIME
TOPICAL | Status: DC | PRN
  Administered 2022-10-31: 8000 mL
  Administered 2022-11-02: 7000 mL

## 2022-10-31 MED ORDER — DEXAMETHASONE SODIUM PHOSPHATE 10 MG/ML IJ SOLN
INTRAMUSCULAR | Status: AC
  Filled 2022-10-31: qty 1

## 2022-10-31 MED ORDER — ROCURONIUM BROMIDE 10 MG/ML (PF) SYRINGE
PREFILLED_SYRINGE | INTRAVENOUS | Status: AC
  Filled 2022-10-31: qty 10

## 2022-10-31 MED ORDER — PROPOFOL 10 MG/ML IV BOLUS
INTRAVENOUS | Status: AC
  Filled 2022-10-31: qty 20

## 2022-10-31 MED ORDER — LACTATED RINGERS IV SOLN: INTRAVENOUS | Status: DC | PRN

## 2022-10-31 MED ORDER — ONDANSETRON HCL 4 MG/2ML IJ SOLN
INTRAMUSCULAR | Status: DC | PRN
  Administered 2022-10-31: 4 mg via INTRAVENOUS

## 2022-10-31 SURGICAL SUPPLY — 50 items
ADH SKN CLS APL DERMABOND .7 (GAUZE/BANDAGES/DRESSINGS)
APL PRP STRL LF DISP 70% ISPRP (MISCELLANEOUS)
BAG COUNTER SPONGE SURGICOUNT (BAG) IMPLANT
BAG SPNG CNTER NS LX DISP (BAG)
CANISTER WOUND CARE 500ML ATS (WOUND CARE) IMPLANT
CHLORAPREP W/TINT 26 (MISCELLANEOUS) ×1 IMPLANT
COVER MAYO STAND STRL (DRAPES) ×1 IMPLANT
COVER SURGICAL LIGHT HANDLE (MISCELLANEOUS) ×2 IMPLANT
DERMABOND ADVANCED .7 DNX12 (GAUZE/BANDAGES/DRESSINGS) IMPLANT
DRAPE LAPAROSCOPIC ABDOMINAL (DRAPES) ×1 IMPLANT
DRAPE LAPAROTOMY T 102X78X121 (DRAPES) IMPLANT
DRAPE LAPAROTOMY TRNSV 102X78 (DRAPES) IMPLANT
DRAPE SHEET LG 3/4 BI-LAMINATE (DRAPES) IMPLANT
DRAPE WARM FLUID 44X44 (DRAPES) ×1 IMPLANT
ELECT REM PT RETURN 15FT ADLT (MISCELLANEOUS) ×2 IMPLANT
GAUZE SPONGE 4X4 12PLY STRL (GAUZE/BANDAGES/DRESSINGS) ×1 IMPLANT
GLOVE BIO SURGEON STRL SZ7 (GLOVE) ×3 IMPLANT
GLOVE BIO SURGEON STRL SZ7.5 (GLOVE) ×2 IMPLANT
GLOVE BIOGEL PI IND STRL 7.0 (GLOVE) ×2 IMPLANT
GLOVE BIOGEL PI IND STRL 7.5 (GLOVE) ×3 IMPLANT
GOWN STRL REUS W/ TWL XL LVL3 (GOWN DISPOSABLE) ×3 IMPLANT
GOWN STRL REUS W/TWL XL LVL3 (GOWN DISPOSABLE) ×3
HANDLE SUCTION POOLE (INSTRUMENTS) ×1 IMPLANT
KIT BASIN OR (CUSTOM PROCEDURE TRAY) ×2 IMPLANT
KIT TURNOVER KIT A (KITS) IMPLANT
LIGASURE IMPACT 36 18CM CVD LR (INSTRUMENTS) IMPLANT
MARKER SKIN DUAL TIP RULER LAB (MISCELLANEOUS) ×1 IMPLANT
NDL HYPO 25X1 1.5 SAFETY (NEEDLE) ×1 IMPLANT
NEEDLE HYPO 25X1 1.5 SAFETY (NEEDLE) ×1 IMPLANT
NS IRRIG 1000ML POUR BTL (IV SOLUTION) ×2 IMPLANT
PACK GENERAL/GYN (CUSTOM PROCEDURE TRAY) ×2 IMPLANT
RELOAD PROXIMATE 75MM BLUE (ENDOMECHANICALS) IMPLANT
RELOAD STAPLE 75 3.8 BLU REG (ENDOMECHANICALS) IMPLANT
SPIKE FLUID TRANSFER (MISCELLANEOUS) IMPLANT
SPONGE ABDOMINAL VAC ABTHERA (MISCELLANEOUS) IMPLANT
SPONGE T-LAP 4X18 ~~LOC~~+RFID (SPONGE) IMPLANT
STAPLER GUN LINEAR PROX 60 (STAPLE) IMPLANT
STAPLER PROXIMATE 75MM BLUE (STAPLE) IMPLANT
STAPLER VISISTAT 35W (STAPLE) IMPLANT
SUCTION POOLE HANDLE (INSTRUMENTS) ×1
SUT MNCRL AB 4-0 PS2 18 (SUTURE) IMPLANT
SUT PDS AB 1 TP1 96 (SUTURE) IMPLANT
SUT SILK 2 0 (SUTURE) ×1
SUT SILK 2 0 SH CR/8 (SUTURE) ×1 IMPLANT
SUT SILK 2-0 18XBRD TIE 12 (SUTURE) ×1 IMPLANT
SUT SILK 3 0 SH CR/8 (SUTURE) ×1 IMPLANT
SUT VIC AB 3-0 SH 18 (SUTURE) IMPLANT
SYR CONTROL 10ML LL (SYRINGE) ×1 IMPLANT
TOWEL OR 17X26 10 PK STRL BLUE (TOWEL DISPOSABLE) ×2 IMPLANT
TRAY FOLEY MTR SLVR 16FR STAT (SET/KITS/TRAYS/PACK) ×1 IMPLANT

## 2022-10-31 NOTE — Op Note (Signed)
  Kaitlyn Bray 10/23/2022 - 10/31/2022   Pre-op Diagnosis: OPEN ABDOMEN     Post-op Diagnosis: SAME  Procedure(s): EXPLORATORY LAPAROTOMY  WASH OUT OF ABDOMEN  ABTHERA WOUND VAC PLACEMENT  Surgeon(s): Abigail Miyamoto, MD  Anesthesia: General  Staff:  Circulator: Fredia Sorrow, RN Relief Scrub: Loletta Parish, RN Scrub Person: Manalo, Corwin Levins, RN; Nanda Quinton W  Estimated Blood Loss: Minimal               Indications: This is a 57 year old female who is status post exploratory laparotomy for a small bowel perforation after robotic ventral incisional hernia repair with mesh.  She is 48 hours out from the washout where a small bowel anastomosis was performed after resection and a wound VAC was placed.  The decision was made to return to the operating room for further wash out.  Findings: The small bowel was significantly dilated with most of the bowel fixated together.  The anastomosis was intact without evidence of leak.  There was moderate turbid fluid in the paracolic gutters and pelvis.  Vicryl mesh was still present in the upper abdomen.  There were areas where bowel was fixated to mesh laterally in the upper abdomen.  No attempt was made to free this off of the bowel for fear of creating a fistula  Procedure: The patient was brought from the intensive care unit still intubated into the operating room.  She was placed upon the operating table and general anesthesia was induced.  The adhesive barrier was then completely removed after the [facility] was disconnected.  I then remove the blue sponges.  Her abdomen was then prepped and draped in usual sterile fashion.  I then remove the internal ABThera VAC.  The patient small bowel was extensively dilated and most of the loops that were visible were stuck to each other.  I made no attempt to free end of these loops for fear of creating a fistula.  I could see the anastomosis from the previous small bowel resection and it appeared  intact.  There was visible Vicryl mesh in the upper part of the abdomen which was loose in sections.  There was small bowel also fixated to the mesh in the upper abdomen as well.  There was turbid fluid in the paracolic gutters and in the pelvis.  I irrigated the abdominal cavity and the gutters and pelvis with 5 L of saline.  It was difficult to tell where the fascia was present.  At this point, given the bowel dilation and turbid fluid, no attempt was made to place a absorbable mesh bridge.  Another ABThera dressing VAC was then replaced is in the usual fashion with 2 blue sponges over the top and then the adhesive dressing.  A small opening was made in the suction device was placed and a good seal was achieved.  The patient tolerated the procedure.  All the counts were correct at the end of the procedure.  The patient was then taken from the operating room still intubated to the intensive care unit.          Abigail Miyamoto   Date: 10/31/2022  Time: 3:12 PM

## 2022-10-31 NOTE — Transfer of Care (Signed)
Immediate Anesthesia Transfer of Care Note  Patient: Kaitlyn Bray  Procedure(s) Performed: EXPLORATORY LAPAROTOMY - WASH OUT OF ABDOMEN AND WOUND VAC PLACEMENT  Patient Location: ICU  Anesthesia Type:General  Level of Consciousness: sedated and Patient remains intubated per anesthesia plan  Airway & Oxygen Therapy: Patient remains intubated per anesthesia plan and Patient placed on Ventilator (see vital sign flow sheet for setting)  Post-op Assessment: Report given to RN and Post -op Vital signs reviewed and stable  Post vital signs: Reviewed and stable  Last Vitals:  Vitals Value Taken Time  BP    Temp 37.1 C 10/31/22 1528  Pulse 81 10/31/22 1528  Resp 20 10/31/22 1528  SpO2 99 % 10/31/22 1528  Vitals shown include unvalidated device data.  Last Pain:  Vitals:   10/31/22 1200  TempSrc: Esophageal  PainSc:       Patients Stated Pain Goal: 0 (10/29/22 1000)  Complications: No notable events documented.

## 2022-10-31 NOTE — TOC Progression Note (Signed)
Transition of Care [facility]) - Progression Note    Patient Details  Name: BLIMIE BACHAND MRN: [id number] Date of Birth: 1966/05/13  Transition of Care [facility]) CM/SW Contact  Lavenia Atlas, RN Phone Number: 10/31/2022, 1:11 PM  Clinical Narrative:   Per chart review patient currently in [facility] ICU, no current TOC needs, brief assessment completed.   TOC will continue to follow for dc needs.      Barriers to Discharge: Continued Medical Work up  Expected Discharge Plan and Services                                               Social Determinants of Health (SDOH) Interventions SDOH Screenings   Food Insecurity: No Food Insecurity (10/23/2022)  Housing: Low Risk  (10/23/2022)  Transportation Needs: No Transportation Needs (10/23/2022)  Utilities: Not At Risk (10/23/2022)  Depression (PHQ2-9): Low Risk  (10/10/2022)  Tobacco Use: Low Risk  (10/30/2022)    Readmission Risk Interventions     No data to display

## 2022-10-31 NOTE — Progress Notes (Signed)
2 Days Post-Op   Subjective/Chief Complaint: No acute issues overnight   Objective: Vital signs in last 24 hours: Temp:  [98.1 F (36.7 C)-100.2 F (37.9 C)] 99.1 F (37.3 C) (06/18 0630) Pulse Rate:  [57-92] 81 (06/18 0630) Resp:  [10-29] 20 (06/18 0630) BP: (94-145)/(45-129) 109/52 (06/18 0630) SpO2:  [95 %-100 %] 99 % (06/18 0630) FiO2 (%):  [40 %] 40 % (06/18 0741) Weight:  [114.5 kg] 114.5 kg (06/18 0400) Last BM Date : 10/28/22  Intake/Output from previous day: 06/17 0701 - 06/18 0700 In: 2121 [I.V.:971; IV Piggyback:1150] Out: 1425 [Urine:975; Drains:450] Intake/Output this shift: No intake/output data recorded.  Exam:  On vent sedated  Lab Results:  Recent Labs    10/30/22 0303 10/31/22 0346  WBC 19.3* 16.3*  HGB 11.8* 8.5*  HCT 38.2 27.5*  PLT 365 331   BMET Recent Labs    10/30/22 1447 10/31/22 0346  NA 135 136  K 4.6 4.5  CL 99 102  CO2 24 27  GLUCOSE 140* 115*  BUN 14 15  CREATININE 0.78 0.74  CALCIUM 7.7* 7.3*   PT/INR No results for input(s): "LABPROT", "INR" in the last 72 hours. ABG Recent Labs    10/29/22 2055  PHART 7.3*  HCO3 27.6    Studies/Results: DG Abd 1 View  Result Date: 10/29/2022 CLINICAL DATA:  OG tube EXAM: ABDOMEN - 1 VIEW COMPARISON:  None Available. FINDINGS: Orogastric tube tip is seen in the proximal stomach with sidehole at the level of the gastroesophageal junction. No dilated bowel loops are seen. There is left body wall soft tissue air. IMPRESSION: 1. Orogastric tube tip is seen in the proximal stomach with sidehole at the level of the gastroesophageal junction. Consider advancing tube 5 cm. 2. Left body wall soft tissue air of uncertain etiology. Please correlate clinically. Electronically Signed   By: Darliss Cheney M.D.   On: 10/29/2022 20:29   DG Chest Port 1 View  Result Date: 10/29/2022 CLINICAL DATA:  Intubated EXAM: PORTABLE CHEST 1 VIEW COMPARISON:  Chest x-ray 10/27/2022 FINDINGS: The endotracheal  tube is 1.9 cm above the carina. Enteric tube extends below the diaphragm. The cardiomediastinal silhouette is within normal limits for projection. There is left basilar atelectasis. The lungs are otherwise clear. There is no pleural effusion or pneumothorax. No acute fractures are seen. IMPRESSION: 1. Endotracheal tube 1.9 cm above the carina. 2. Left basilar atelectasis. Electronically Signed   By: Darliss Cheney M.D.   On: 10/29/2022 20:26   [place] EKG SITE RITE  Result Date: 10/29/2022 If Site Rite image not attached, placement could not be confirmed due to current cardiac rhythm.   Anti-infectives: Anti-infectives (From admission, onward)    Start     Dose/Rate Route Frequency Ordered Stop   10/28/22 1000  azithromycin (ZITHROMAX) tablet 250 mg  Status:  Discontinued       See Hyperspace for full Linked Orders Report.   250 mg Oral Daily 10/27/22 0738 10/27/22 1653   10/27/22 1700  piperacillin-tazobactam (ZOSYN) IVPB 3.375 g        3.375 g 12.5 mL/hr over 240 Minutes Intravenous Every 8 hours 10/27/22 1653     10/27/22 1000  azithromycin (ZITHROMAX) tablet 500 mg       See Hyperspace for full Linked Orders Report.   500 mg Oral Daily 10/27/22 0738 10/27/22 0946   10/23/22 1054  ceFAZolin (ANCEF) 2-4 GM/100ML-% IVPB       Note to Pharmacy: Augustine Radar R: cabinet override  10/23/22 1054 10/23/22 2259   10/23/22 0600  ceFAZolin (ANCEF) IVPB 3g/100 mL premix        3 g 200 mL/hr over 30 Minutes Intravenous On call to O.R. 10/23/22 0540 10/23/22 1213       Assessment/Plan: Ms. Martinec underwent difficult robotic recurrent incisional hernia repair with mesh, bilateral posterior rectus myofascial release, bilateral transversus abdominis release and removal of previous polypropylene mesh with prolonged lyses of adhesions on 10/23/22. This was complicated by bowel perforation, possible missed enterotomy during dissection of small intestine off mesh, requiring return to the operating room  with washout, small bowel resection and ABTHERA placement on 10/29/22.   I discussed the situation with the patient and her husband yesterday and again this morning. Will return to the OR today to wash out her abdomen and assess the fascial defect.   She will eventually require a bridge of absorbable mesh. I discussed the risks as well   Abigail Miyamoto MD 10/31/2022

## 2022-10-31 NOTE — Progress Notes (Signed)
Pt bagged to OR with no complications.  °

## 2022-10-31 NOTE — Progress Notes (Signed)
NAME:  Kaitlyn Bray, MRN:  [id number], DOB:  08/13/1965, LOS: 8 ADMISSION DATE:  10/23/2022, CONSULTATION DATE:  6/16 REFERRING MD:  Cornett- surgery, CHIEF COMPLAINT:  post-op vent   History of Present Illness:  Ms. Trabue is a 57 y/o woman with a history of a ventral hernia with recent robotic incisional hernia repair on 6/10 who had been progressing well until she developed fever and drainage of foul-smelling fluid from her lower abdominal incision. She returned to the OR for exploratory ex-lap 6/16, found to have bowel contents throughout her abdomen. She required excision of the mesh and identification of her site of perforation, which was repaired then resected with end-to-end anastamosis. Her abdomen was unable to be closed and despite extensive irrigation, she will require additional washout. She had cultures collected intraoperatively. Zosyn was given in the OR.  She was brought to the ICU intubated, sedated, paralyzed post-op. She will remain intubated until she is able to return to the OR to be closed.   She has a complicated abdominal surgery history with previous hysterectomy, multiple incisional hernias in the past. She has had post-op ileus complicating several previous surgeries.   Pertinent  Medical History  Obesity Diabetes Hypertension  Significant Hospital Events: Including procedures, antibiotic start and stop dates in addition to other pertinent events   6/10 hernia repair with mesh 6/16 return to operating room for ex lap, partial small bowel resection with reanastomosis, abdomen left open with ABThera 6/14 Urine output decreased overnight   Interim History / Subjective:  Off pressors this am Going to OR around noon for washout Tmax 100.2  Objective   Blood pressure (!) 109/52, pulse 81, temperature 99.1 F (37.3 C), resp. rate 20, height 5\' 5"  (1.651 m), weight 114.5 kg, SpO2 99 %.    Vent Mode: PRVC FiO2 (%):  [40 %] 40 % Set Rate:  [20 bmp] 20 bmp Vt  Set:  [460 mL] 460 mL PEEP:  [5 cmH20] 5 cmH20 Plateau Pressure:  [14 cmH20-16 cmH20] 14 cmH20   Intake/Output Summary (Last 24 hours) at 10/31/2022 2130 Last data filed at 10/31/2022 0630 Gross per 24 hour  Intake 2120.99 ml  Output 1425 ml  Net 695.99 ml   Filed Weights   10/29/22 2000 10/30/22 0400 10/31/22 0400  Weight: 114.5 kg 114.5 kg 114.5 kg    Examination: Fentanyl 200, versed 3 General:  Older adult female lying in bed in NAD on MV HEENT: MM pink/moist, ETT 7 at 23 lip/ OGT with bilious, pupils 3/r, anicteric  Neuro:  opens eyes to commands but not sustained, f/c, MAE, writes occasionally to communicate- denies pain CV:  rr, NSR, no murmur, RUE PICC PULM:  non labored on full MV support, CTA GI: obese, midline wound vac with minimal serosanguinous drainage, quiet BS, foley- sediment noted in tubing, hazy yellow Extremities: warm/dry, trace general edema  Skin: no rashes   UOP 975 ml/ 24hrs Overall I/Os inaccurate given prior unmeasured urinary occurrences  Wound vac > 450 ml/ 24hrs Labs reviewed > stable sCr, H/H 10.1/ 32.5> 11.8/ 38> 8.5/ 27.5   Resolved Hospital Problem list     Assessment & Plan:  Peritonitis  Septic shock  -Due to bowel perforation post hernia repair 6/10, status post ex lap with resection and reanastomosis of small bowel on 6/16 -Abdomen left open 6/16,  P: - Primary management per surgery, appreciate assistance.  Plans to return to OR today for washout, hopeful washout/ closure possibly 6/20 pending events today -  remains NPO - cont ABTHERA, monitor drainage - off NE this am.  PRN for MAP goal > 65 - cont zosyn  - ongoing pain control - will need to determine/ discuss nutritional needs, ?TPN w/ CCS   Postop vent management P: -  Continue MV support, 4-8cc/kg IBW with goal Pplat <30 and DP<15 with open abd.  SBT contraindicated.  -VAP prevention protocol/ PPI -PAD protocol for sedation> cont fentanyl/ versed gtts, RASS goal -1/-2 -  wean FiO2 as able for SpO2 >92%  - intermittent CXR/ ABG  Diabetes with hyperglycemia P: - remains controlled.  Cont SSI - Hold home Metformin   Hyperkalemia, Hyponatremia> resolved  P: - Avoid hypotonic fluids  - monitor BMET, replete electrolytes prn Trend Bmet   Anemia, postop P: - no evidence of bleeding> off pressors, OGT output bilious, wound vac output stable/ down.   - recheck H/H now and send type and screen  - transfuse for Hgb < 7  Post-op pain control P: - PAD protocol as above - cont to hold home Robaxin, Gabapentin, and Tramadol > NPO  Allergic rhinitis P: - Hold home Claritin and Flonase   Best Practice (right click and "Reselect all SmartList Selections" daily)   Diet/type: NPO DVT prophylaxis: LMWH GI prophylaxis: PPI Lines: Central line/ PICC RUE Foley:  Yes, and it is still needed Code Status:  full code Last date of multidisciplinary goals of care discussion Pending   Husband updated at bedside 6/18 am.   Critical care time:   CRITICAL CARE Performed by: Posey Boyer  Total critical care time: 32 minutes  Critical care time was exclusive of separately billable procedures and treating other patients.  Critical care was necessary to treat or prevent imminent or life-threatening deterioration.  Critical care was time spent personally by me on the following activities: development of treatment plan with patient and/or surrogate as well as nursing, discussions with consultants, evaluation of patient's response to treatment, examination of patient, obtaining history from patient or surrogate, ordering and performing treatments and interventions, ordering and review of laboratory studies, ordering and review of radiographic studies, pulse oximetry and re-evaluation of patient's condition.    Posey Boyer, MSN, NP, AG-ACNP-BC [facility] 10/31/2022, 7:13 AM  See Amion for pager If no response to pager , please call [phone] until 7pm After 7:00 pm call Elink  [phone]

## 2022-10-31 NOTE — Anesthesia Preprocedure Evaluation (Signed)
Anesthesia Evaluation  Patient identified by MRN, date of birth, ID band Patient awake    Reviewed: Allergy & Precautions, NPO status , Patient's Chart, lab work & pertinent test results  History of Anesthesia Complications (+) PONV and history of anesthetic complications  Airway Mallampati: Intubated  TM Distance: >3 FB Neck ROM: Full    Dental  (+) Teeth Intact, Dental Advisory Given, Poor Dentition, Missing,    Pulmonary asthma    breath sounds clear to auscultation       Cardiovascular hypertension, Pt. on medications Normal cardiovascular exam+ Valvular Problems/Murmurs  Rhythm:Regular Rate:Normal   Echo 07/30/20: EF 60-65%, normal diastolic function, normal RVSF, mild AV sclerosis w/o stenosis   Neuro/Psych negative neurological ROS  negative psych ROS   GI/Hepatic Neg liver ROS,,,Perforated bowel s/p exlap now with open abdomen   Endo/Other  diabetes, Poorly Controlled, Type 2, Oral Hypoglycemic Agents  Morbid obesityHyperlipidemia  Renal/GU negative Renal ROS  negative genitourinary   Musculoskeletal negative musculoskeletal ROS (+)  Wound dehiscience   Abdominal   Peds  Hematology  (+) Blood dyscrasia, anemia   Anesthesia Other Findings Last po 3pm  Reproductive/Obstetrics                             Anesthesia Physical Anesthesia Plan  ASA: 4  Anesthesia Plan: General   Post-op Pain Management: Ofirmev IV (intra-op)*   Induction: Intravenous  PONV Risk Score and Plan: 4 or greater and Ondansetron, Dexamethasone, Treatment may vary due to age or medical condition and Scopolamine patch - Pre-op  Airway Management Planned: Oral ETT  Additional Equipment:   Intra-op Plan:   Post-operative Plan: Post-operative intubation/ventilation  Informed Consent: I have reviewed the patients History and Physical, chart, labs and discussed the procedure including the risks, benefits  and alternatives for the proposed anesthesia with the patient or authorized representative who has indicated his/her understanding and acceptance.     Dental advisory given  Plan Discussed with: Anesthesiologist and CRNA  Anesthesia Plan Comments:         Anesthesia Quick Evaluation

## 2022-10-31 NOTE — Progress Notes (Signed)
Output from wound vac 50 cc prior to surgery at 2pm

## 2022-11-01 ENCOUNTER — Encounter (HOSPITAL_COMMUNITY): Payer: Self-pay | Admitting: Surgery

## 2022-11-01 DIAGNOSIS — K432 Incisional hernia without obstruction or gangrene: Secondary | ICD-10-CM | POA: Diagnosis not present

## 2022-11-01 LAB — CBC
HCT: 26.6 % — ABNORMAL LOW (ref 36.0–46.0)
Hemoglobin: 7.8 g/dL — ABNORMAL LOW (ref 12.0–15.0)
MCH: 27.5 pg (ref 26.0–34.0)
MCHC: 29.3 g/dL — ABNORMAL LOW (ref 30.0–36.0)
MCV: 93.7 fL (ref 80.0–100.0)
Platelets: 364 10*3/uL (ref 150–400)
RBC: 2.84 MIL/uL — ABNORMAL LOW (ref 3.87–5.11)
RDW: 15 % (ref 11.5–15.5)
WBC: 17.6 10*3/uL — ABNORMAL HIGH (ref 4.0–10.5)
nRBC: 0.2 % (ref 0.0–0.2)

## 2022-11-01 LAB — GLUCOSE, CAPILLARY
Glucose-Capillary: 109 mg/dL — ABNORMAL HIGH (ref 70–99)
Glucose-Capillary: 112 mg/dL — ABNORMAL HIGH (ref 70–99)
Glucose-Capillary: 112 mg/dL — ABNORMAL HIGH (ref 70–99)
Glucose-Capillary: 119 mg/dL — ABNORMAL HIGH (ref 70–99)
Glucose-Capillary: 119 mg/dL — ABNORMAL HIGH (ref 70–99)
Glucose-Capillary: 141 mg/dL — ABNORMAL HIGH (ref 70–99)

## 2022-11-01 LAB — RENAL FUNCTION PANEL
Albumin: 1.6 g/dL — ABNORMAL LOW (ref 3.5–5.0)
Anion gap: 7 (ref 5–15)
BUN: 21 mg/dL — ABNORMAL HIGH (ref 6–20)
CO2: 28 mmol/L (ref 22–32)
Calcium: 7.5 mg/dL — ABNORMAL LOW (ref 8.9–10.3)
Chloride: 103 mmol/L (ref 98–111)
Creatinine, Ser: 0.8 mg/dL (ref 0.44–1.00)
GFR, Estimated: >60 mL/min (ref >60)
Glucose, Bld: 122 mg/dL — ABNORMAL HIGH (ref 70–99)
Phosphorus: 3.9 mg/dL (ref 2.5–4.6)
Potassium: 4.8 mmol/L (ref 3.5–5.1)
Sodium: 138 mmol/L (ref 135–145)

## 2022-11-01 LAB — AEROBIC/ANAEROBIC CULTURE W GRAM STAIN (SURGICAL/DEEP WOUND): Gram Stain: NONE SEEN

## 2022-11-01 MED ORDER — FUROSEMIDE 10 MG/ML IJ SOLN
20.0000 mg | Freq: Once | INTRAMUSCULAR | Status: AC
  Administered 2022-11-01: 20 mg via INTRAVENOUS
  Filled 2022-11-01: qty 2

## 2022-11-01 MED ORDER — TRAVASOL 10 % IV SOLN
INTRAVENOUS | Status: AC
  Filled 2022-11-01: qty 432

## 2022-11-01 NOTE — Progress Notes (Signed)
1 Day Post-Op   Subjective/Chief Complaint: Remains on the vent   Objective: Vital signs in last 24 hours: Temp:  [97 F (36.1 C)-99.9 F (37.7 C)] 99.7 F (37.6 C) (06/19 0400) Pulse Rate:  [74-109] 74 (06/19 0400) Resp:  [15-26] 20 (06/19 0400) BP: (86-122)/(41-60) 106/53 (06/19 0400) SpO2:  [95 %-100 %] 100 % (06/19 0400) FiO2 (%):  [40 %] 40 % (06/19 0429) Last BM Date : 10/28/22  Intake/Output from previous day: 06/18 0701 - 06/19 0700 In: 619.1 [I.V.:569.1; IV Piggyback:49.9] Out: 1055 [Urine:705; Drains:350] Intake/Output this shift: No intake/output data recorded.  Exam: Sedated, on vent Abdomen obese, moderate abdominal wall edema, VAC in place, No frank peritonitis  Lab Results:  Recent Labs    10/31/22 0346 10/31/22 0806 11/01/22 0500  WBC 16.3*  --  17.6*  HGB 8.5* 8.7* 7.8*  HCT 27.5* 27.8* 26.6*  PLT 331  --  364   BMET Recent Labs    10/31/22 0346 11/01/22 0606  NA 136 138  K 4.5 4.8  CL 102 103  CO2 27 28  GLUCOSE 115* 122*  BUN 15 21*  CREATININE 0.74 0.80  CALCIUM 7.3* 7.5*   PT/INR No results for input(s): "LABPROT", "INR" in the last 72 hours. ABG Recent Labs    10/29/22 2055  PHART 7.3*  HCO3 27.6    Studies/Results: No results found.  Anti-infectives: Anti-infectives (From admission, onward)    Start     Dose/Rate Route Frequency Ordered Stop   10/28/22 1000  azithromycin (ZITHROMAX) tablet 250 mg  Status:  Discontinued       See Hyperspace for full Linked Orders Report.   250 mg Oral Daily 10/27/22 0738 10/27/22 1653   10/27/22 1700  piperacillin-tazobactam (ZOSYN) IVPB 3.375 g        3.375 g 12.5 mL/hr over 240 Minutes Intravenous Every 8 hours 10/27/22 1653     10/27/22 1000  azithromycin (ZITHROMAX) tablet 500 mg       See Hyperspace for full Linked Orders Report.   500 mg Oral Daily 10/27/22 0738 10/27/22 0946   10/23/22 1054  ceFAZolin (ANCEF) 2-4 GM/100ML-% IVPB       Note to Pharmacy: Augustine Radar R:  cabinet override      10/23/22 1054 10/23/22 2259   10/23/22 0600  ceFAZolin (ANCEF) IVPB 3g/100 mL premix        3 g 200 mL/hr over 30 Minutes Intravenous On call to O.R. 10/23/22 0540 10/23/22 1213       Assessment/Plan: Ms. Kreiss underwent difficult robotic recurrent incisional hernia repair with mesh, bilateral posterior rectus myofascial release, bilateral transversus abdominis release and removal of previous polypropylene mesh with prolonged lyses of adhesions on 10/23/22. This was complicated by bowel perforation, possible missed enterotomy during dissection of small intestine off mesh, requiring return to the operating room with washout, small bowel resection and ABTHERA placement on 10/29/22   OR yesterday for repeat washout and VAC change.  Bowel still significantly dilated with turbid fluid in the abdomen.  Will need to return to the OR tomorrow to wash out again  Hgb lower. Will follow Malnutrition-  will start TNA Continue vent, NPO, antibiotics  Abigail Miyamoto MD 11/01/2022

## 2022-11-01 NOTE — Progress Notes (Signed)
NAME:  Kaitlyn Bray, MRN:  [id number], DOB:  1965-11-18, LOS: 9 ADMISSION DATE:  10/23/2022, CONSULTATION DATE:  6/16 REFERRING MD:  Cornett- surgery, CHIEF COMPLAINT:  post-op vent   History of Present Illness:  Kaitlyn Bray is a 57 y/o woman with a history of a ventral hernia with recent robotic incisional hernia repair on 6/10 who had been progressing well until she developed fever and drainage of foul-smelling fluid from her lower abdominal incision. She returned to the OR for exploratory ex-lap 6/16, found to have bowel contents throughout her abdomen. She required excision of the mesh and identification of her site of perforation, which was repaired then resected with end-to-end anastamosis. Her abdomen was unable to be closed and despite extensive irrigation, she will require additional washout. She had cultures collected intraoperatively. Zosyn was given in the OR.  She was brought to the ICU intubated, sedated, paralyzed post-op. She will remain intubated until she is able to return to the OR to be closed.   She has a complicated abdominal surgery history with previous hysterectomy, multiple incisional hernias in the past. She has had post-op ileus complicating several previous surgeries.   Pertinent  Medical History  Obesity Diabetes Hypertension  Significant Hospital Events: Including procedures, antibiotic start and stop dates in addition to other pertinent events   6/10 hernia repair with mesh 6/16 return to operating room for ex lap, partial small bowel resection with reanastomosis, abdomen left open with ABThera 6/14 Urine output decreased overnight   Interim History / Subjective:  No events overnight Pt denies pain  Objective   Blood pressure 128/60, pulse 81, temperature 98.1 F (36.7 C), temperature source Axillary, resp. rate 20, height 5\' 5"  (1.651 m), weight 114.5 kg, SpO2 95 %.    Vent Mode: PRVC FiO2 (%):  [40 %] 40 % Set Rate:  [20 bmp] 20 bmp Vt Set:  [460  mL] 460 mL PEEP:  [5 cmH20] 5 cmH20 Plateau Pressure:  [13 cmH20-16 cmH20] 16 cmH20   Intake/Output Summary (Last 24 hours) at 11/01/2022 0845 Last data filed at 11/01/2022 0800 Gross per 24 hour  Intake 1151.08 ml  Output 1105 ml  Net 46.08 ml   Filed Weights   10/29/22 2000 10/30/22 0400 10/31/22 0400  Weight: 114.5 kg 114.5 kg 114.5 kg    Examination: Fentanyl 200, versed 4 General:  critically ill older female lying in bed in NAD HEENT: MM pink/moist, ETT/ OGT- bilious, pupils 4/r Neuro: easily awakens to verbal, f/c, nods appropriately, MAE CV: rr, NSR, no murmur PULM:  non labored on full MV support, CTA, scant whitish secretions GI:  distended abd w/midline wound vac in place with darker turbid/serosanguinous, hypobs, foley Extremities: warm/dry, no pitting LE edema  Skin: no rashes   Tmax 99.9 Labs reviewed> stable BMET, albumin 1.6, H/H 8.5> 7.8/26.6  Resolved Hospital Problem list     Assessment & Plan:  Peritonitis  Septic shock, resolved  -Due to bowel perforation post hernia repair 6/10, status post ex lap with resection and reanastomosis of small bowel on 6/16 -Abdomen left open 6/16, wash out 6/18 unable to close P: - Primary management per surgery, appreciate assistance.   - plans to return to OR 6/20 for washout, hopeful closure  - remains NPO.  Start TPN per pharmacy  - cont ABTHERA/ wound vac>  monitor drainage - remains off vasopressor support, goal MAP > 65 - cont zosyn  - follow fluid culture  - net +8L, diurese today  Postop vent  management P: -  Continue full MV support, 4-8cc/kg IBW with goal Pplat <30 and DP<15.  SBT contraindicated w/ open abd  -VAP prevention protocol/ PPI -PAD protocol for sedation> cont fentanyl/ versed gtts, RASS goal -1/-2 while open abd - wean FiO2 as able for SpO2 >92%  - intermittent CXR/ ABG  Diabetes with hyperglycemia P: - remains controlled.  Cont SSI - Hold home Metformin   Hyperkalemia, Hyponatremia>  resolved  P: - monitor BMET, replete electrolytes prn  Anemia, postop P: - Hgb down today.  No obvious signs bleeding.   - transfuse for Hgb < 7 - daily CBC  Post-op pain control P: - PAD protocol as above - cont to hold home Robaxin, Gabapentin, and Tramadol > NPO  Allergic rhinitis P: - Hold home Claritin and Flonase   Best Practice (right click and "Reselect all SmartList Selections" daily)   Diet/type: NPO; start TPN DVT prophylaxis: LMWH GI prophylaxis: PPI Lines: Central line/ PICC RUE Foley:  Yes, and it is still needed Code Status:  full code Last date of multidisciplinary goals of care discussion Pending   Pending update 6/19.    Critical care time:   CRITICAL CARE Performed by: Posey Boyer  Total critical care time: 30 minutes  Critical care time was exclusive of separately billable procedures and treating other patients.  Critical care was necessary to treat or prevent imminent or life-threatening deterioration.  Critical care was time spent personally by me on the following activities: development of treatment plan with patient and/or surrogate as well as nursing, discussions with consultants, evaluation of patient's response to treatment, examination of patient, obtaining history from patient or surrogate, ordering and performing treatments and interventions, ordering and review of laboratory studies, ordering and review of radiographic studies, pulse oximetry and re-evaluation of patient's condition.    Posey Boyer, MSN, NP, AG-ACNP-BC [facility] 11/01/2022, 8:45 AM  See Amion for pager If no response to pager , please call [phone] until 7pm After 7:00 pm call Elink  [phone]

## 2022-11-01 NOTE — Progress Notes (Signed)
PHARMACY - TOTAL PARENTERAL NUTRITION CONSULT NOTE   Indication: Prolonged ileus  Patient Measurements: Height: 5\' 5"  (165.1 cm) Weight: 114.5 kg (252 lb 6.8 oz) IBW/kg (Calculated) : 57 TPN AdjBW (KG): 71.4 Body mass index is 42.01 kg/m. Usual Weight:   Assessment:  Pharmacy is consulted to start TPN on a 57 yo pt with complicated robotic recurrent incisional hernia repair with mesh, bilateral posterior rectus myofascial release, bilateral transversus abdominis release and removal of previous polypropylene mesh with prolonged lyses of adhesions on 10/23/22/ This was complicated by a bowel perforation  requiring return to OR on 6/18.   Glucose / Insulin:  - Pt on mSSI q4h  - CBGs have ranged 98-123 in past 24 hours ( target <150)  - A1c on 6/3 is 8.5  Electrolytes:  - WNL,including Corrected calcium at 9.4.  Renal:  - Scr < 1 - BUN 21  Hepatic:  - WNL  Intake / Output; MIVF:  - Not on mIVF - Urine output unmeasured  GI Imaging: GI Surgeries / Procedures:  - 6/10 Robotic incisional hernia repair - 6/16 exploratory laparotomy    Central access: PICC  TPN start date: 11/01/22  Nutritional Goals: Goal TPN rate is 100 mL/hr (provides 108 g of protein and 2011 kcals per day)  RD Assessment: Estimated Needs Total Energy Estimated Needs: 1900-2200 kcals Total Protein Estimated Needs: 115-145 grams Total Fluid Estimated Needs: >/= 1.9L  Current Nutrition:  NPO  Plan:  Start TPN at 40 mL/hr at 1800 Electrolytes in TPN:  Na 80mEq/L K 40mEq/L  Ca 67mEq/L  Mg 5mEq/L  Phos 28mmol/L Cl:Ac 1:1 Add standard MVI and trace elements to TPN Continue Moderate q4h SSI and adjust as needed  Not on maintenance fluids  BMP, magnesium, phosphorus with AM labs  Monitor TPN labs on Mon/Thurs  Adalberto Cole, PharmD, BCPS 11/01/2022 11:25 AM

## 2022-11-01 NOTE — Progress Notes (Signed)
Nutrition Follow-up  DOCUMENTATION CODES:   Morbid obesity  INTERVENTION:  - Plan to start TPN today per Surgery.  - TPN management per Pharmacy.   - Monitor magnesium, potassium, and phosphorus for at least 3 days.  - Daily weights while on TPN   NUTRITION DIAGNOSIS:   Inadequate oral intake related to inability to eat as evidenced by NPO status. *ongoing  GOAL:   Patient will meet greater than or equal to 90% of their needs *unmet, starting TPN  MONITOR:   Vent status, Labs, Weight trends  REASON FOR ASSESSMENT:   Consult New TPN/TNA  ASSESSMENT:   57 y.o. female with PMH HTN and Diabetes who presented for robotic hernia repair now complicated by bowel perforation.  6/10 Admit, s/p hernia repair with lysis of adhesions 6/16 patient went to bathroom and had large amount of foul smelling fluid gush from abdominal wall; taken to surgery emergently -> found to have small bowel injury with perforation and contamination of abdominal cavity s/p ex-lap, resection of small bowel perf, ABThera wound VAC placed with open abdomen 6/18 ex-lap, washout of abdomen, ABThera wound VAC placement  Patient awake and alert on vent at time of visit. NGT remains in place, to LIS.   Discussed plan to start TPN this evening to provide nutrition. Patient appeared to endorse understanding.   Per Surgery notes, plan for repeat washout tomorrow and hopefully closure of abdomen.     Medications reviewed and include: Lasix, Insulin, Miralax Fentanyl Versed  Labs reviewed:  Triglycerides 169   Diet Order:   Diet Order             Diet NPO time specified  Diet effective now                   EDUCATION NEEDS:  Not appropriate for education at this time  Skin:  Skin Assessment: Skin Integrity Issues: Skin Integrity Issues:: Incisions Incisions: Abdomen  Last BM:  6/15  Height:  Ht Readings from Last 1 Encounters:  10/29/22 5\' 5"  (1.651 m)   Weight:  Wt Readings from  Last 1 Encounters:  10/31/22 114.5 kg   Ideal Body Weight:  56.82 kg  BMI:  Body mass index is 42.01 kg/m.  Estimated Nutritional Needs:  Kcal:  1900-2200 kcals Protein:  115-145 grams Fluid:  >/= 1.9L    Shelle Iron RD, LDN For contact information, refer to [facility].

## 2022-11-02 ENCOUNTER — Inpatient Hospital Stay (HOSPITAL_COMMUNITY): Payer: 59 | Admitting: Certified Registered Nurse Anesthetist

## 2022-11-02 ENCOUNTER — Encounter (HOSPITAL_COMMUNITY)
Admission: RE | Disposition: A | Discharge status: Rehabilitation Facility | Payer: Self-pay | Source: Home / Self Care | Attending: Surgery

## 2022-11-02 ENCOUNTER — Other Ambulatory Visit: Payer: Self-pay

## 2022-11-02 DIAGNOSIS — T8140XA Infection following a procedure, unspecified, initial encounter: Secondary | ICD-10-CM | POA: Diagnosis not present

## 2022-11-02 DIAGNOSIS — Z978 Presence of other specified devices: Secondary | ICD-10-CM

## 2022-11-02 DIAGNOSIS — I10 Essential (primary) hypertension: Secondary | ICD-10-CM | POA: Diagnosis not present

## 2022-11-02 DIAGNOSIS — K631 Perforation of intestine (nontraumatic): Secondary | ICD-10-CM

## 2022-11-02 DIAGNOSIS — Z7984 Long term (current) use of oral hypoglycemic drugs: Secondary | ICD-10-CM | POA: Diagnosis not present

## 2022-11-02 DIAGNOSIS — E1165 Type 2 diabetes mellitus with hyperglycemia: Secondary | ICD-10-CM | POA: Diagnosis not present

## 2022-11-02 DIAGNOSIS — K659 Peritonitis, unspecified: Secondary | ICD-10-CM | POA: Diagnosis not present

## 2022-11-02 HISTORY — PX: LAPAROTOMY: SHX154

## 2022-11-02 LAB — GLUCOSE, CAPILLARY
Glucose-Capillary: 117 mg/dL — ABNORMAL HIGH (ref 70–99)
Glucose-Capillary: 118 mg/dL — ABNORMAL HIGH (ref 70–99)
Glucose-Capillary: 123 mg/dL — ABNORMAL HIGH (ref 70–99)
Glucose-Capillary: 129 mg/dL — ABNORMAL HIGH (ref 70–99)
Glucose-Capillary: 139 mg/dL — ABNORMAL HIGH (ref 70–99)
Glucose-Capillary: 143 mg/dL — ABNORMAL HIGH (ref 70–99)

## 2022-11-02 LAB — COMPREHENSIVE METABOLIC PANEL
ALT: 14 U/L (ref 0–44)
AST: 20 U/L (ref 15–41)
Albumin: 1.6 g/dL — ABNORMAL LOW (ref 3.5–5.0)
Alkaline Phosphatase: 62 U/L (ref 38–126)
Anion gap: 10 (ref 5–15)
BUN: 22 mg/dL — ABNORMAL HIGH (ref 6–20)
CO2: 30 mmol/L (ref 22–32)
Calcium: 7.3 mg/dL — ABNORMAL LOW (ref 8.9–10.3)
Chloride: 102 mmol/L (ref 98–111)
Creatinine, Ser: 0.71 mg/dL (ref 0.44–1.00)
GFR, Estimated: >60 mL/min (ref >60)
Glucose, Bld: 148 mg/dL — ABNORMAL HIGH (ref 70–99)
Potassium: 4.2 mmol/L (ref 3.5–5.1)
Sodium: 142 mmol/L (ref 135–145)
Total Bilirubin: 0.2 mg/dL — ABNORMAL LOW (ref 0.3–1.2)
Total Protein: 5.5 g/dL — ABNORMAL LOW (ref 6.5–8.1)

## 2022-11-02 LAB — CBC
HCT: 27.8 % — ABNORMAL LOW (ref 36.0–46.0)
Hemoglobin: 8.2 g/dL — ABNORMAL LOW (ref 12.0–15.0)
MCH: 28.1 pg (ref 26.0–34.0)
MCHC: 29.5 g/dL — ABNORMAL LOW (ref 30.0–36.0)
MCV: 95.2 fL (ref 80.0–100.0)
Platelets: 413 10*3/uL — ABNORMAL HIGH (ref 150–400)
RBC: 2.92 MIL/uL — ABNORMAL LOW (ref 3.87–5.11)
RDW: 14.9 % (ref 11.5–15.5)
WBC: 18.9 10*3/uL — ABNORMAL HIGH (ref 4.0–10.5)
nRBC: 0.3 % — ABNORMAL HIGH (ref 0.0–0.2)

## 2022-11-02 LAB — AEROBIC/ANAEROBIC CULTURE W GRAM STAIN (SURGICAL/DEEP WOUND)

## 2022-11-02 LAB — PHOSPHORUS: Phosphorus: 2.8 mg/dL (ref 2.5–4.6)

## 2022-11-02 LAB — MAGNESIUM: Magnesium: 2.7 mg/dL — ABNORMAL HIGH (ref 1.7–2.4)

## 2022-11-02 SURGERY — LAPAROTOMY, EXPLORATORY
Anesthesia: General

## 2022-11-02 MED ORDER — FENTANYL CITRATE (PF) 100 MCG/2ML IJ SOLN
INTRAMUSCULAR | Status: DC | PRN
  Administered 2022-11-02: 100 ug via INTRAVENOUS

## 2022-11-02 MED ORDER — TRAVASOL 10 % IV SOLN
INTRAVENOUS | Status: AC
  Filled 2022-11-02: qty 432

## 2022-11-02 MED ORDER — MIDAZOLAM HCL 5 MG/5ML IJ SOLN
INTRAMUSCULAR | Status: DC | PRN
  Administered 2022-11-02: 2 mg via INTRAVENOUS

## 2022-11-02 MED ORDER — MIDAZOLAM HCL 2 MG/2ML IJ SOLN
INTRAMUSCULAR | Status: AC
  Filled 2022-11-02: qty 2

## 2022-11-02 MED ORDER — ROCURONIUM BROMIDE 10 MG/ML (PF) SYRINGE
PREFILLED_SYRINGE | INTRAVENOUS | Status: DC | PRN
  Administered 2022-11-02: 60 mg via INTRAVENOUS

## 2022-11-02 MED ORDER — LORAZEPAM 2 MG/ML IJ SOLN: 2.0000 mg | INTRAMUSCULAR | Status: DC | PRN

## 2022-11-02 MED ORDER — FENTANYL CITRATE (PF) 100 MCG/2ML IJ SOLN
INTRAMUSCULAR | Status: AC
  Filled 2022-11-02: qty 2

## 2022-11-02 MED ORDER — FUROSEMIDE 10 MG/ML IJ SOLN
40.0000 mg | Freq: Once | INTRAMUSCULAR | Status: AC
  Administered 2022-11-02: 40 mg via INTRAVENOUS
  Filled 2022-11-02: qty 4

## 2022-11-02 MED ORDER — ROCURONIUM BROMIDE 10 MG/ML (PF) SYRINGE
PREFILLED_SYRINGE | INTRAVENOUS | Status: AC
  Filled 2022-11-02: qty 10

## 2022-11-02 MED ORDER — HYDROMORPHONE HCL 1 MG/ML IJ SOLN
1.0000 mg | INTRAMUSCULAR | Status: DC | PRN
  Administered 2022-11-08: 1 mg via INTRAVENOUS
  Filled 2022-11-02: qty 1

## 2022-11-02 SURGICAL SUPPLY — 33 items
APL PRP STRL LF DISP 70% ISPRP (MISCELLANEOUS) ×1
BAG COUNTER SPONGE SURGICOUNT (BAG) IMPLANT
BAG SPNG CNTER NS LX DISP (BAG)
CHLORAPREP W/TINT 26 (MISCELLANEOUS) ×1 IMPLANT
COVER MAYO STAND STRL (DRAPES) ×1 IMPLANT
COVER SURGICAL LIGHT HANDLE (MISCELLANEOUS) ×1 IMPLANT
DRAPE LAPAROSCOPIC ABDOMINAL (DRAPES) ×1 IMPLANT
DRAPE WARM FLUID 44X44 (DRAPES) ×1 IMPLANT
ELECT REM PT RETURN 15FT ADLT (MISCELLANEOUS) ×1 IMPLANT
GLOVE BIO SURGEON STRL SZ7.5 (GLOVE) ×1 IMPLANT
GLOVE BIOGEL PI IND STRL 7.0 (GLOVE) ×1 IMPLANT
GOWN STRL REUS W/ TWL XL LVL3 (GOWN DISPOSABLE) ×1 IMPLANT
GOWN STRL REUS W/TWL XL LVL3 (GOWN DISPOSABLE) ×1
HANDLE SUCTION POOLE (INSTRUMENTS) ×1 IMPLANT
KIT BASIN OR (CUSTOM PROCEDURE TRAY) ×1 IMPLANT
KIT TURNOVER KIT A (KITS) IMPLANT
LIGASURE IMPACT 36 18CM CVD LR (INSTRUMENTS) IMPLANT
NS IRRIG 1000ML POUR BTL (IV SOLUTION) ×1 IMPLANT
PACK GENERAL/GYN (CUSTOM PROCEDURE TRAY) ×1 IMPLANT
RELOAD PROXIMATE 75MM BLUE (ENDOMECHANICALS) IMPLANT
RELOAD STAPLE 75 3.8 BLU REG (ENDOMECHANICALS) IMPLANT
SPONGE ABD ABTHERA ADVANCE (MISCELLANEOUS) IMPLANT
STAPLER GUN LINEAR PROX 60 (STAPLE) IMPLANT
STAPLER PROXIMATE 75MM BLUE (STAPLE) IMPLANT
STAPLER VISISTAT 35W (STAPLE) IMPLANT
SUCTION POOLE HANDLE (INSTRUMENTS) ×1
SUT PDS AB 1 TP1 96 (SUTURE) IMPLANT
SUT SILK 2 0 (SUTURE) ×1
SUT SILK 2 0 SH CR/8 (SUTURE) ×1 IMPLANT
SUT SILK 2-0 18XBRD TIE 12 (SUTURE) ×1 IMPLANT
SUT SILK 3 0 SH CR/8 (SUTURE) ×1 IMPLANT
TOWEL OR 17X26 10 PK STRL BLUE (TOWEL DISPOSABLE) ×1 IMPLANT
TRAY FOLEY MTR SLVR 16FR STAT (SET/KITS/TRAYS/PACK) ×1 IMPLANT

## 2022-11-02 NOTE — Anesthesia Preprocedure Evaluation (Signed)
Anesthesia Evaluation  Patient identified by MRN, date of birth, ID band Patient awake    Reviewed: Allergy & Precautions, NPO status , Patient's Chart, lab work & pertinent test results  History of Anesthesia Complications (+) PONV and history of anesthetic complications  Airway Mallampati: Intubated  TM Distance: >3 FB Neck ROM: Full    Dental  (+) Teeth Intact, Dental Advisory Given, Poor Dentition, Missing,    Pulmonary asthma    breath sounds clear to auscultation       Cardiovascular hypertension, Pt. on medications Normal cardiovascular exam+ Valvular Problems/Murmurs  Rhythm:Regular Rate:Normal   Echo 07/30/20: EF 60-65%, normal diastolic function, normal RVSF, mild AV sclerosis w/o stenosis   Neuro/Psych negative neurological ROS  negative psych ROS   GI/Hepatic Neg liver ROS,,,Perforated bowel s/p exlap now with open abdomen   Endo/Other  diabetes, Poorly Controlled, Type 2, Oral Hypoglycemic Agents  Morbid obesityHyperlipidemia  Renal/GU negative Renal ROS  negative genitourinary   Musculoskeletal negative musculoskeletal ROS (+)  Wound dehiscience   Abdominal   Peds  Hematology  (+) Blood dyscrasia, anemia   Anesthesia Other Findings   Reproductive/Obstetrics                             Anesthesia Physical Anesthesia Plan  ASA: 4  Anesthesia Plan: General   Post-op Pain Management: Dilaudid IV   Induction: Intravenous  PONV Risk Score and Plan: 4 or greater and Ondansetron, Dexamethasone, Treatment may vary due to age or medical condition, Midazolam and Droperidol  Airway Management Planned: Oral ETT  Additional Equipment:   Intra-op Plan:   Post-operative Plan: Post-operative intubation/ventilation  Informed Consent: I have reviewed the patients History and Physical, chart, labs and discussed the procedure including the risks, benefits and alternatives for the  proposed anesthesia with the patient or authorized representative who has indicated his/her understanding and acceptance.     Dental advisory given  Plan Discussed with: Anesthesiologist and CRNA  Anesthesia Plan Comments:         Anesthesia Quick Evaluation

## 2022-11-02 NOTE — Progress Notes (Signed)
NAME:  Kaitlyn Bray, MRN:  [id number], DOB:  Dec 23, 1965, LOS: 10 ADMISSION DATE:  10/23/2022, CONSULTATION DATE:  6/16 REFERRING MD:  Cornett- surgery, CHIEF COMPLAINT:  post-op vent   History of Present Illness:  Kaitlyn Bray is a 57 y/o woman with a history of a ventral hernia with recent robotic incisional hernia repair on 6/10 who had been progressing well until she developed fever and drainage of foul-smelling fluid from her lower abdominal incision. She returned to the OR for exploratory ex-lap 6/16, found to have bowel contents throughout her abdomen. She required excision of the mesh and identification of her site of perforation, which was repaired then resected with end-to-end anastamosis. Her abdomen was unable to be closed and despite extensive irrigation, she will require additional washout. She had cultures collected intraoperatively. Zosyn was given in the OR.  She was brought to the ICU intubated, sedated, paralyzed post-op. She will remain intubated until she is able to return to the OR to be closed.   She has a complicated abdominal surgery history with previous hysterectomy, multiple incisional hernias in the past. She has had post-op ileus complicating several previous surgeries.   Pertinent  Medical History  Obesity Diabetes Hypertension  Significant Hospital Events: Including procedures, antibiotic start and stop dates in addition to other pertinent events   6/10 hernia repair with mesh 6/14 Urine output decreased overnight 10  6/16 ex lap resection of small bowel perf. Gross contamination of space. Left open and intubated. Pccm consult   6/18 ex lap washout. Significantly dilated bowel, turbid fluid. Left open  6/19 started TPN 6/20 plan for OR today for washout   Interim History / Subjective:  2.5L out yesterday  NAEO   No c/o pain this morning   CBC and CMP are still pending  Objective   Blood pressure (!) 107/51, pulse 81, temperature 98.6 F (37 C),  temperature source Oral, resp. rate 20, height 5\' 5"  (1.651 m), weight 118.4 kg, SpO2 97 %. CVP:  [8 mmHg-10 mmHg] 8 mmHg  Vent Mode: PRVC FiO2 (%):  [30 %] 30 % Set Rate:  [20 bmp] 20 bmp Vt Set:  [460 mL] 460 mL PEEP:  [5 cmH20] 5 cmH20 Pressure Support:  [10 cmH20] 10 cmH20 Plateau Pressure:  [14 cmH20-16 cmH20] 15 cmH20   Intake/Output Summary (Last 24 hours) at 11/02/2022 0911 Last data filed at 11/02/2022 0600 Gross per 24 hour  Intake 1028.52 ml  Output 2985 ml  Net -1956.48 ml   Filed Weights   10/30/22 0400 10/31/22 0400 11/02/22 0700  Weight: 114.5 kg 114.5 kg 118.4 kg    Examination: General:  Critically ill appearing middle aged F intubated sedated NAD  HEENT: NCAT ETT secure Neuro: Awakens to voice and follows commands, nods/shakes head appropriately  CV: rr cap refill < 3 sec  PULM:  Symmetrical chest expansion, mechanically ventilated  GI:  Soft, nontender. Midline abthera intact  Extremities: no acute joint deformity. RUE PICC  Skin: c/d/w   Resolved Hospital Problem list    Hyperkalemia,  Hyponatremia  Assessment & Plan:   Septic shock due to peritonitis from small bowel perf, with improved shock  Post op pain  -bowel perf following hernia repair (6/10) requiring emergent ex lap small bowel resection (6/16), ex lap washout (6/18), and plan for washout 6/20  -open abd in this setting   -peritoneal fluid with moderate group G strep, moderate actinomyces, abundant bacteroides ovatus  P: -plan for OR 6/20 for washout, possible closure  -  NPO -TPN  -monitor abthera output  -cont zosyn, follow fluid cx   -RASS goal  -2 with open abd -- fent versed gtts   Endotracheally intubated // expected post op vent management  P: -Cont MV support -with open abd no SBT   Acute anemia, multifactorial  -dont think ABLA from OR is culprit, has been minimal EBL during cases -more likely anemia related to sepsis/critical illness, and iatrogenic losses from labs   P: -CBC is pending   DM2  P: - SSI  Allergic rhinitis P: - Hold home Claritin and Flonase   Best Practice (right click and "Reselect all SmartList Selections" daily)   Diet/type: NPO;  TPN DVT prophylaxis: LMWH GI prophylaxis: PPI Lines: Central line/ PICC RUE Foley:  Yes, and it is still needed Code Status:  full code Last date of multidisciplinary goals of care discussion- pt updated 6/20   CRITICAL CARE Performed by: Lanier Clam   Total critical care time: 38 minutes  Critical care time was exclusive of separately billable procedures and treating other patients. Critical care was necessary to treat or prevent imminent or life-threatening deterioration.  Critical care was time spent personally by me on the following activities: development of treatment plan with patient and/or surrogate as well as nursing, discussions with consultants, evaluation of patient's response to treatment, examination of patient, obtaining history from patient or surrogate, ordering and performing treatments and interventions, ordering and review of laboratory studies, ordering and review of radiographic studies, pulse oximetry and re-evaluation of patient's condition.  Kaitlyn Fass MSN, AGACNP-BC [facility] Pulmonary/Critical Care Medicine Amion for pager  11/02/2022, 9:11 AM

## 2022-11-02 NOTE — Progress Notes (Signed)
PHARMACY - TOTAL PARENTERAL NUTRITION CONSULT NOTE   Indication: Prolonged ileus  Patient Measurements: Height: 5\' 5"  (165.1 cm) Weight: 118.4 kg (261 lb 0.4 oz) IBW/kg (Calculated) : 57 TPN AdjBW (KG): 71.4 Body mass index is 43.44 kg/m. Usual Weight:   Assessment:  Pharmacy is consulted to start TPN on a 57 yo pt with complicated robotic recurrent incisional hernia repair with mesh, bilateral posterior rectus myofascial release, bilateral transversus abdominis release and removal of previous polypropylene mesh with prolonged lyses of adhesions on 10/23/22. This was complicated by a bowel perforation requiring return to OR on 6/16, 6/18, and 6/20.   Glucose / Insulin:  - A1c on 6/3 is 8.5 - Pt on mSSI q4h  - CBGs have ranged 112-148 in past 24 hours ( target <150) - previous 24 hour insulin use: 4 units    Electrolytes:  - Magnesium elevated.  Remaining electrolytes WNL,including corrected calcium at 9.2. However, phosphorus down significantly from 3.9 to 2.8 since beginning TPN. Renal:  - Scr < 1 - BUN 22  Hepatic:  - WNL  Intake / Output; MIVF:  - Not on mIVF - Urine output: 2540, drain: 800+ - I/O Net: - 1755.9 GI Imaging: - 6/16 DG Abd for OG tube placement GI Surgeries / Procedures:  - 6/10 Robotic incisional hernia repair - 6/16 exploratory laparotomy, repair of small bowel perforation  - 6/18 exploratory laparotomy, wash out of abdomen and wound vac placement  - 6/20 exploratory laparotomy, wound wash out of abdomen and wound vac change    Central access: PICC  TPN start date: 11/01/22  Nutritional Goals: Goal TPN rate is 100 mL/hr (provides 108 g of protein and 2011 kcals per day)  RD Assessment: Estimated Needs Total Energy Estimated Needs: 1900-2200 kcals Total Protein Estimated Needs: 115-145 grams Total Fluid Estimated Needs: >/= 1.9L  Current Nutrition:  NPO  Plan:  Continue TPN at 40 mL/hr at 1800 Electrolytes in TPN:  Na 63mEq/L K 60mEq/L   Ca 57mEq/L  Mg 0 mEq/L - remove Phos 36mmol/L Cl:Ac 1:1 Add standard MVI and trace elements to TPN Continue Moderate q4h SSI and adjust as needed  Not on maintenance fluids  BMP, magnesium, phosphorus with AM labs  Monitor TPN labs on Mon/Thurs   Thank you for allowing pharmacy to be a part of this patient's care.  Selinda Eon, PharmD, BCPS Clinical Pharmacist [place] 11/02/2022 9:50 AM

## 2022-11-02 NOTE — Anesthesia Postprocedure Evaluation (Signed)
Anesthesia Post Note  Patient: Kaitlyn Bray  Procedure(s) Performed: EXPLORATORY LAPAROTOMY Wound wash out of abdomen and wound vac change     Patient location during evaluation: ICU Anesthesia Type: General Level of consciousness: patient remains intubated per anesthesia plan Pain management: pain level controlled Vital Signs Assessment: post-procedure vital signs reviewed and stable Respiratory status: spontaneous breathing, nonlabored ventilation, respiratory function stable and patient remains intubated per anesthesia plan Cardiovascular status: blood pressure returned to baseline and stable Postop Assessment: no apparent nausea or vomiting Anesthetic complications: no   No notable events documented.  Last Vitals:  Vitals:   11/02/22 1037 11/02/22 1100  BP:  (!) 120/59  Pulse:  79  Resp:  20  Temp:    SpO2: 100% 96%    Last Pain:  Vitals:   11/02/22 0830  TempSrc:   PainSc: 0-No pain                 Lowella Curb

## 2022-11-02 NOTE — Progress Notes (Signed)
Patient ID: Kaitlyn Bray, female   DOB: 25-Jun-1965, 57 y.o.   MRN: [id number]   Pre Procedure note for inpatients:   Kaitlyn Bray has been scheduled for Procedure(s): EXPLORATORY LAPAROTOMY - WASH OUT OF ABDOMEN AND WOUND VAC PLACEMENT (N/A) today. The various methods of treatment have been discussed with the patient. After consideration of the risks, benefits and treatment options the patient has consented to the planned procedure.   The patient has been seen and labs reviewed. There are no changes in the patient's condition to prevent proceeding with the planned procedure today.  Recent labs:  Lab Results  Component Value Date   WBC 18.9 (H) 11/02/2022   HGB 8.2 (L) 11/02/2022   HCT 27.8 (L) 11/02/2022   PLT 413 (H) 11/02/2022   GLUCOSE 122 (H) 11/01/2022   CHOL 169 09/15/2022   TRIG 169 (H) 10/30/2022   HDL 41.50 09/15/2022   LDLDIRECT 109.0 09/15/2022   LDLCALC 95 08/26/2020   ALT 15 10/31/2022   AST 14 (L) 10/31/2022   NA 138 11/01/2022   K 4.8 11/01/2022   CL 103 11/01/2022   CREATININE 0.80 11/01/2022   BUN 21 (H) 11/01/2022   CO2 28 11/01/2022   TSH 1.75 08/26/2020   HGBA1C 8.5 (H) 10/16/2022   MICROALBUR <0.7 10/10/2022    Abigail Miyamoto, MD 11/02/2022 9:04 AM

## 2022-11-02 NOTE — Op Note (Signed)
   Kaitlyn Bray 11/02/2022   Pre-op Diagnosis: OPEN ABDOMEN     Post-op Diagnosis: SAME  Procedure(s): EXPLORATORY LAPAROTOMY  WASH OUT OF ABDOMEN ABTHERA WOUND VAC PLACEMENT  Surgeon(s): Abigail Miyamoto, MD  Anesthesia: General  Staff:  Scrub Person: Angelena Form; Pershing Cox M  Estimated Blood Loss: Minimal               Indications: We are returning to the operating room to again washout the abdomen and replaced the wound VAC to evaluate the bowel and intra-abdominal contents  Findings: There was still a moderate amount of turbid fluid in the paracolic gutters and pelvis.  There was no evidence of any small bowel fistula or ischemic bowel.  The bowel was less edematous as well as the abdominal wall.  There is still mesh in the upper abdomen fixated to loops of small bowel.  Procedure: The patient was brought to the operating room intubated from the intensive care unit.  She was placed upon the operating table and general anesthesia was induced.  We removed the overlying wound VAC and then the abdomen was prepped and draped.  I then removed the inner ABThera VAC from the abdomen.  The small bowel was still distended but less so than 48 hours ago.  There was fibrinous exudate but no evidence of fistula formation.  The previous anastomosis was intact.  There was still turbid and was purulent fluid in the paracolic gutters and pelvis.  There was mesh in the upper abdomen that was still fixated to loops of bowel.  The abdominal wall was much less edematous.  At this point, I irrigated the abdomen in all quadrants with approximately 6 L of normal saline.  The decision was then made to reapply the [facility] and continue the current care.  A new ABThera dressing VAC was then replaced into the abdomen and 2 separate blue sponges were placed over the top of this.  This was then secured in place with adhesive dressings.  A small opening was then placed in the suction device was applied  and a good seal was achieved in place of the vacuum device.  The patient tolerated the procedure well.  All the counts were correct at the end of the procedure.  The patient was then taken in a stable condition, still intubated to the intensive care unit.          Abigail Miyamoto   Date: 11/02/2022  Time: 10:21 AM

## 2022-11-02 NOTE — Transfer of Care (Signed)
Immediate Anesthesia Transfer of Care Note  Patient: Kaitlyn Bray  Procedure(s) Performed: EXPLORATORY LAPAROTOMY Wound wash out of abdomen and wound vac change  Patient Location: PACU  Anesthesia Type:General  Level of Consciousness: Patient remains intubated per anesthesia plan  Airway & Oxygen Therapy: Patient remains intubated per anesthesia plan and Patient placed on Ventilator (see vital sign flow sheet for setting)  Post-op Assessment: Report given to RN and Post -op Vital signs reviewed and stable  Post vital signs: Reviewed and stable  Last Vitals:  Vitals Value Taken Time  BP    Temp    Pulse 78 11/02/22 1040  Resp 20 11/02/22 1040  SpO2 98 % 11/02/22 1040  Vitals shown include unvalidated device data.  Last Pain:  Vitals:   11/02/22 0830  TempSrc:   PainSc: 0-No pain      Patients Stated Pain Goal: 0 (10/29/22 1000)  Complications: No notable events documented.

## 2022-11-03 ENCOUNTER — Encounter (HOSPITAL_COMMUNITY): Payer: Self-pay | Admitting: Surgery

## 2022-11-03 DIAGNOSIS — K659 Peritonitis, unspecified: Secondary | ICD-10-CM | POA: Diagnosis not present

## 2022-11-03 DIAGNOSIS — K631 Perforation of intestine (nontraumatic): Secondary | ICD-10-CM | POA: Diagnosis not present

## 2022-11-03 DIAGNOSIS — K432 Incisional hernia without obstruction or gangrene: Secondary | ICD-10-CM | POA: Diagnosis not present

## 2022-11-03 DIAGNOSIS — Z978 Presence of other specified devices: Secondary | ICD-10-CM | POA: Diagnosis not present

## 2022-11-03 LAB — CBC
HCT: 29.2 % — ABNORMAL LOW (ref 36.0–46.0)
Hemoglobin: 8.4 g/dL — ABNORMAL LOW (ref 12.0–15.0)
MCH: 27.6 pg (ref 26.0–34.0)
MCHC: 28.8 g/dL — ABNORMAL LOW (ref 30.0–36.0)
MCV: 96.1 fL (ref 80.0–100.0)
Platelets: 353 10*3/uL (ref 150–400)
RBC: 3.04 MIL/uL — ABNORMAL LOW (ref 3.87–5.11)
RDW: 15 % (ref 11.5–15.5)
WBC: 19.3 10*3/uL — ABNORMAL HIGH (ref 4.0–10.5)
nRBC: 0.6 % — ABNORMAL HIGH (ref 0.0–0.2)

## 2022-11-03 LAB — RENAL FUNCTION PANEL
Albumin: 1.7 g/dL — ABNORMAL LOW (ref 3.5–5.0)
Anion gap: 9 (ref 5–15)
BUN: 21 mg/dL — ABNORMAL HIGH (ref 6–20)
CO2: 29 mmol/L (ref 22–32)
Calcium: 7.4 mg/dL — ABNORMAL LOW (ref 8.9–10.3)
Chloride: 107 mmol/L (ref 98–111)
Creatinine, Ser: 0.86 mg/dL (ref 0.44–1.00)
GFR, Estimated: >60 mL/min (ref >60)
Glucose, Bld: 140 mg/dL — ABNORMAL HIGH (ref 70–99)
Phosphorus: 2.8 mg/dL (ref 2.5–4.6)
Potassium: 3.9 mmol/L (ref 3.5–5.1)
Sodium: 145 mmol/L (ref 135–145)

## 2022-11-03 LAB — GLUCOSE, CAPILLARY
Glucose-Capillary: 124 mg/dL — ABNORMAL HIGH (ref 70–99)
Glucose-Capillary: 128 mg/dL — ABNORMAL HIGH (ref 70–99)
Glucose-Capillary: 129 mg/dL — ABNORMAL HIGH (ref 70–99)
Glucose-Capillary: 130 mg/dL — ABNORMAL HIGH (ref 70–99)
Glucose-Capillary: 142 mg/dL — ABNORMAL HIGH (ref 70–99)
Glucose-Capillary: 142 mg/dL — ABNORMAL HIGH (ref 70–99)

## 2022-11-03 LAB — BASIC METABOLIC PANEL
Anion gap: 8 (ref 5–15)
BUN: 21 mg/dL — ABNORMAL HIGH (ref 6–20)
CO2: 30 mmol/L (ref 22–32)
Calcium: 7.4 mg/dL — ABNORMAL LOW (ref 8.9–10.3)
Chloride: 107 mmol/L (ref 98–111)
Creatinine, Ser: 0.79 mg/dL (ref 0.44–1.00)
GFR, Estimated: >60 mL/min (ref >60)
Glucose, Bld: 139 mg/dL — ABNORMAL HIGH (ref 70–99)
Potassium: 3.9 mmol/L (ref 3.5–5.1)
Sodium: 145 mmol/L (ref 135–145)

## 2022-11-03 LAB — PHOSPHORUS: Phosphorus: 2.8 mg/dL (ref 2.5–4.6)

## 2022-11-03 LAB — SURGICAL PATHOLOGY

## 2022-11-03 LAB — MAGNESIUM: Magnesium: 2.7 mg/dL — ABNORMAL HIGH (ref 1.7–2.4)

## 2022-11-03 MED ORDER — FLUCONAZOLE IN SODIUM CHLORIDE 400-0.9 MG/200ML-% IV SOLN
800.0000 mg | Freq: Once | INTRAVENOUS | Status: AC
  Administered 2022-11-03: 400 mg via INTRAVENOUS
  Filled 2022-11-03 (×2): qty 400

## 2022-11-03 MED ORDER — TRAVASOL 10 % IV SOLN
INTRAVENOUS | Status: AC
  Filled 2022-11-03: qty 648

## 2022-11-03 MED ORDER — FLUCONAZOLE IN SODIUM CHLORIDE 400-0.9 MG/200ML-% IV SOLN
400.0000 mg | INTRAVENOUS | Status: DC
  Administered 2022-11-04 – 2022-11-15 (×12): 400 mg via INTRAVENOUS
  Filled 2022-11-03 (×12): qty 200

## 2022-11-03 MED ORDER — FUROSEMIDE 10 MG/ML IJ SOLN
40.0000 mg | Freq: Once | INTRAMUSCULAR | Status: AC
  Administered 2022-11-03: 40 mg via INTRAVENOUS
  Filled 2022-11-03: qty 4

## 2022-11-03 NOTE — Progress Notes (Signed)
NAME:  Kaitlyn Bray, MRN:  [id number], DOB:  Oct 28, 1965, LOS: 11 ADMISSION DATE:  10/23/2022, CONSULTATION DATE:  6/16 REFERRING MD:  Cornett- surgery, CHIEF COMPLAINT:  post-op vent   History of Present Illness:  Kaitlyn Bray is a 57 y/o woman with a history of a ventral hernia with recent robotic incisional hernia repair on 6/10 who had been progressing well until she developed fever and drainage of foul-smelling fluid from her lower abdominal incision. She returned to the OR for exploratory ex-lap 6/16, found to have bowel contents throughout her abdomen. She required excision of the mesh and identification of her site of perforation, which was repaired then resected with end-to-end anastamosis. Her abdomen was unable to be closed and despite extensive irrigation, she will require additional washout. She had cultures collected intraoperatively. Zosyn was given in the OR.  She was brought to the ICU intubated, sedated, paralyzed post-op. She will remain intubated until she is able to return to the OR to be closed.   She has a complicated abdominal surgery history with previous hysterectomy, multiple incisional hernias in the past. She has had post-op ileus complicating several previous surgeries.   Pertinent  Medical History  Obesity Diabetes Hypertension  Significant Hospital Events: Including procedures, antibiotic start and stop dates in addition to other pertinent events   6/10 hernia repair with mesh 6/14 Urine output decreased overnight 10  6/16 ex lap resection of small bowel perf. Gross contamination of space. Left open and intubated. Pccm consult   6/18 ex lap washout. Significantly dilated bowel, turbid fluid. Left open  6/19 started TPN 6/20 plan for OR today for washout  6/21 adding fluc. ID consult   Interim History / Subjective:   Temp  uptrending -- 100.8 overnight  Made 3.2 L UOP yesterday   Candida in abd fluid cx   Objective   Blood pressure (!) 109/49, pulse  77, temperature (!) 100.5 F (38.1 C), temperature source Oral, resp. rate 20, height 5\' 5"  (1.651 m), weight 107.2 kg, SpO2 99 %. CVP:  [4 mmHg-6 mmHg] 6 mmHg  Vent Mode: PRVC FiO2 (%):  [28 %-30 %] 30 % Set Rate:  [20 bmp] 20 bmp Vt Set:  [460 mL] 460 mL PEEP:  [5 cmH20] 5 cmH20 Plateau Pressure:  [15 cmH20] 15 cmH20   Intake/Output Summary (Last 24 hours) at 11/03/2022 0843 Last data filed at 11/03/2022 0810 Gross per 24 hour  Intake 1580.1 ml  Output 3520 ml  Net -1939.9 ml   Filed Weights   10/31/22 0400 11/02/22 0700 11/03/22 0500  Weight: 114.5 kg 118.4 kg 107.2 kg   Examination: General:  Critically ill middle aged F intubated sedated  HEENT: NCAT ETT secure anicteric sclera  Neuro: Sedate. Awakens to voice and follows commands  CV: rr cap refill < 3 sec PULM:  Mechanically ventilated Symmetrical chest expansion  GI:  midline surgical site w wound vac  Extremities: no acute joint deformity  Skin: c/d/w pale   Resolved Hospital Problem list    Hyperkalemia,  Hyponatremia  Assessment & Plan:   Septic shock due to peritonitis from small bowel perf -- improved shock  Post op pain  -bowel perf following hernia repair (6/10) requiring emergent ex lap small bowel resection (6/16), ex lap washout (6/18), washout (6/20)  -open abd in this setting   -peritoneal fluid with moderate group G strep, moderate actinomyces, abundant bacteroides ovatus, moderate candida albicans   P: -next washout 6/22, hopeful closure  -NPO -TPN  -  monitor abthera output  -cont zosyn, adding fluc for moderate candida & will consult ID  -RASS goal  -2 with open abd -- fent versed gtts   Endotracheally intubated // expected post op vent mgmnt  P: -Cont MV support -with open abd no sedation wean. Ok with SBTs as tolerated but dont think this is imperative as we are not able   Acute anemia, multifactorial -dont think ABLA from OR is culprit, has been minimal EBL during cases -more likely  anemia related to sepsis/critical illness, and iatrogenic losses from labs  P: -AM CBC  Dm2  P: - SSI  Inadequate PO intake // malnutrition  P: -TPN   Allergic rhinitis P: - Hold home Claritin and Flonase   Best Practice (right click and "Reselect all SmartList Selections" daily)   Diet/type: NPO;  TPN DVT prophylaxis: LMWH GI prophylaxis: PPI Lines: Central line/ PICC RUE Foley:  Yes, and it is still needed Code Status:  full code Last date of multidisciplinary goals of care discussion- pt updated 6/21  CRITICAL CARE Performed by: Lanier Clam   Total critical care time: 38 minutes  Critical care time was exclusive of separately billable procedures and treating other patients. Critical care was necessary to treat or prevent imminent or life-threatening deterioration.  Critical care was time spent personally by me on the following activities: development of treatment plan with patient and/or surrogate as well as nursing, discussions with consultants, evaluation of patient's response to treatment, examination of patient, obtaining history from patient or surrogate, ordering and performing treatments and interventions, ordering and review of laboratory studies, ordering and review of radiographic studies, pulse oximetry and re-evaluation of patient's condition.  Tessie Fass MSN, AGACNP-BC [facility] Pulmonary/Critical Care Medicine Amion for pager 11/03/2022, 8:43 AM

## 2022-11-03 NOTE — Consult Note (Signed)
[facility]                                                                                        Patient Identification: Patient Name: Kaitlyn Bray MRN: [id number] Admit Date: 10/23/2022  5:34 AM Today's Date: 11/03/2022 Reason for consult: Complicated intra-abdominal infection Requesting provider: Delorise Shiner NP  Principal Problem:   Recurrent ventral hernia Active Problems:   Peritonitis (HCC)   Endotracheally intubated   Small bowel perforation (HCC)   Antibiotics:  Pip-tazo 6/14-c Azithromycin 6/14  Lines/Hardware: PICC right arm  Assessment 57 YO female with Type 2 DM and multiple abdominal surgeries admitted with   # Post surgical complicated intraabdominal infection/Feculent peritonitis  - most recently had robotic incisional hernia repair with mesh ( vicryl mesh patch of posterior layer of repair) , removal of previous intraperitoneal mesh and lysis of adhesions, bilateral transversus abdominis and bilateral posterior rectus myofascial release on 6/10 who had been progressing well until started developing fever and drainage of foul-smelling fluid from her lower abdominal incision site - underwent OR for exploratory ex lap with resection of small bowel perforation and primary anastomosis and excision of mesh with placement of ABThera wound VAC and open abdomen to complete loss of domain and peritonitis with significant gross contamination of the peritoneal and subcutaneous space( faeculent peritonitis) on 6/16, found to have bowel contents throughout her abdomen. Cultures polymicrobial ( group G streptococci, actinomyecs, bacteroides ovatus beta lactamase positive and candida albicans).  Her abdomen was unable to be closed - underwent repeat OR on 6/18 for ex lap, washout of abdomen and ABThera wound VAC placement then  - 6/20 lap with washout of abdomen and ABThera wound VAC placement    # Fevers/leukocytosis  Likely 2/2 above but has a PICC for TNP and hence concern for infection   Recommendations  Continue zosyn as is  Fluconazole was added today and will see if it helps any regarding fevers and leukocytosis  Blood cx 2 sets peripheral ordered due to + of picc line.  Will hold off an anti MRSA coverage for now and see if any improvement with adding fluconazole today  It appears that infected is still present and has not achieved complete source control. Defer to surgical team if mesh can be safely removed  Following peripherally over the weekend, please call with active questions    Rest of the management as per the primary team. Please call with questions or concerns.  Thank you for the consult  __________________________________________________________________________________________________________ HPI and Hospital Course: 57 year old female with PMH of asthma, HTN, type II DM, history of ex lap for repair of ventral hernia in 07/29/2020 followed by  incisional hernia repair with insertion of mesh in 08/26/2021 and most recently robotic incisional hernia repair with mesh ( vicryl mesh patch of posterior layer of repair) , removal of previous intraperitoneal mesh and lysis of adhesions, bilateral transversus abdominis and bilateral posterior rectus myofascial release on 6/10 who had been progressing well until started developing fever and drainage of foul-smelling fluid from her lower abdominal incision site and underwent OR for exploratory ex lap with resection of  small bowel perforation and primary anastomosis and excision of mesh with placement of ABThera wound VAC and open abdomen to complete loss of domain and peritonitis with significant gross contamination of the peritoneal and subcutaneous space( faeculent peritonitis0 on 6/16, found to have bowel contents throughout her abdomen. Cultures polymicrobial. Her abdomen was unable to be closed and has underwent repeat OR on  6/18 for ex lap, washout of abdomen and ABThera wound VAC placement then 6/20 lap with washout of abdomen and ABThera wound VAC placement with findings as below    6/19 OR findings  The small bowel was significantly dilated with most of the bowel fixated together.  The anastomosis was intact without evidence of leak.  There was moderate turbid fluid in the paracolic gutters and pelvis.  Vicryl mesh was still present in the upper abdomen.  There were areas where bowel was fixated to mesh laterally in the upper abdomen.  No attempt was made to free this off of the bowel for fear of creating a fistula    6/20 OR findings  Findings: There was still a moderate amount of turbid/purulent fluid in the paracolic gutters and pelvis.  There was no evidence of any small bowel fistula or ischemic bowel.  The bowel was less edematous as well as the abdominal wall.  There is still mesh in the upper abdomen fixated to loops of small bowel.  Patient has been intermittently febrile since admission, T max in the last 24-48 hrs 101.6. She has continued leukocytosis with with recent uptrend up to 19.3. She has been on zosyn since 6/14. Fluconazole added today. She has a PICC line placed on 6/17 for TNP with no obvious signs of infection   ROS: unavailable as patient is intubated   Past Medical History:  Diagnosis Date   Asthma    Complication of anesthesia    Heart murmur    Hernia    Hypertension    New onset type 2 diabetes mellitus (HCC) 09/15/2022   PONV (postoperative nausea and vomiting)    Pre-diabetes    S/P hernia repair 08/26/2021   SBO (small bowel obstruction) (HCC) 09/15/2021   Past Surgical History:  Procedure Laterality Date   ABDOMINAL HYSTERECTOMY     one ovary stlil in place. removed uterus cervix and pt thinks fallopian tubes   APPENDECTOMY     CESAREAN SECTION     INCISIONAL HERNIA REPAIR N/A 08/26/2021   Procedure: INCISIONAL HERNIA REPAIR;  Surgeon: [name], MD;  Location: [facility]  OR;  Service: General;  Laterality: N/A;   INSERTION OF MESH N/A 08/26/2021   Procedure: INSERTION OF MESH;  Surgeon: [name], MD;  Location: [facility] OR;  Service: General;  Laterality: N/A;   LAPAROTOMY N/A 07/29/2020   Procedure: EXPLORATORY LAPAROTOMY, REPAIR OF VENTRAL HERNIA;  Surgeon: [name], MD;  Location: [facility] OR;  Service: General;  Laterality: N/A;   LAPAROTOMY N/A 10/29/2022   Procedure: EXPLORATORY LAPAROTOMY WITH REPAIR OF SMALL BOWEL PERFORATION, AND PLACEMENT OF WOUND VAC;  Surgeon: Harriette Bouillon, MD;  Location: [facility] ORS;  Service: General;  Laterality: N/A;   LAPAROTOMY N/A 10/31/2022   Procedure: EXPLORATORY LAPAROTOMY - WASH OUT OF ABDOMEN AND WOUND VAC PLACEMENT;  Surgeon: Abigail Miyamoto, MD;  Location: [facility] ORS;  Service: General;  Laterality: N/A;   MYOMECTOMY     XI ROBOTIC ASSISTED VENTRAL HERNIA N/A 10/23/2022   Procedure: ROBOTIC RECURRENT INCISIONAL HERNIA REPAIR WITH MESH WITH BILATERAL TRANVERSUS ABDOMINUS MYOFACIAL RELEASE AND BILATERAL POSTERIOR RECTUS MYOFACIAL RELEASE;  Surgeon: [name], MD;  Location: [facility] ORS;  Service: General;  Laterality: N/A;    Scheduled Meds:  Chlorhexidine Gluconate Cloth  6 each Topical Daily   enoxaparin (LOVENOX) injection  40 mg Subcutaneous Q24H   fentaNYL (SUBLIMAZE) injection  50 mcg Intravenous Once   fluticasone  2 spray Each Nare Daily   insulin aspart  0-15 Units Subcutaneous Q4H   mouth rinse  15 mL Mouth Rinse Q2H   pantoprazole (PROTONIX) IV  40 mg Intravenous Q24H   polyethylene glycol  17 g Per Tube Daily   sodium chloride flush  10-40 mL Intracatheter Q12H   Continuous Infusions:  sodium chloride 10 mL/hr at 10/31/22 1354   sodium chloride Stopped (10/30/22 1653)   sodium chloride     sodium chloride     fentaNYL infusion INTRAVENOUS 200 mcg/hr (11/03/22 0810)   [START ON 11/04/2022] fluconazole (DIFLUCAN) IV     fluconazole (DIFLUCAN) IV     midazolam 3 mg/hr (11/03/22 0810)    piperacillin-tazobactam (ZOSYN)  IV 12.5 mL/hr at 11/03/22 0810   TPN ADULT (ION) 40 mL/hr at 11/03/22 0810   TPN ADULT (ION)     PRN Meds:.sodium chloride, Place/Maintain arterial line **AND** sodium chloride, acetaminophen (TYLENOL) oral liquid 160 mg/5 mL, acetaminophen, fentaNYL, HYDROmorphone (DILAUDID) injection, LORazepam, midazolam, ondansetron (ZOFRAN) IV, mouth rinse, prochlorperazine, simethicone, sodium chloride flush  Allergies  Allergen Reactions   Codeine Other (See Comments)    Fast heart rate   Latex Other (See Comments)    Redness from bandaids   Penicillins Rash   Sulfa Drugs Cross Reactors Rash   Social History   Socioeconomic History   Marital status: Married    Spouse name: Not on file   Number of children: 1   Years of education: Not on file   Highest education level: Not on file  Occupational History   Occupation: daycare    Comment: [employer]  Tobacco Use   Smoking status: Never   Smokeless tobacco: Never  Vaping Use   Vaping Use: Never used  Substance and Sexual Activity   Alcohol use: No   Drug use: No   Sexual activity: Not Currently    Partners: Male    Birth control/protection: Surgical  Other Topics Concern   Not on file  Social History Narrative   One 57 y/o boy    Lives with husband and son    Cats outside   Social Determinants of Health   Financial Resource Strain: Not on file  Food Insecurity: No Food Insecurity (10/23/2022)   Hunger Vital Sign    Worried About Running Out of Food in the Last Year: Never true    Ran Out of Food in the Last Year: Never true  Transportation Needs: No Transportation Needs (10/23/2022)   PRAPARE - Administrator, Civil Service (Medical): No    Lack of Transportation (Non-Medical): No  Physical Activity: Not on file  Stress: Not on file  Social Connections: Not on file  Intimate Partner Violence: Not At Risk (10/23/2022)   Humiliation, Afraid, Rape, and Kick questionnaire    Fear  of Current or Ex-Partner: No    Emotionally Abused: No    Physically Abused: No    Sexually Abused: No    Family History  Problem Relation Age of Onset   Hypertension Mother    Stroke Mother    Dementia Mother    Heart disease Mother    Hyperlipidemia Mother    COPD  Father    Rheum arthritis Father    Mental illness Father    Heart disease Maternal Grandmother    Heart disease Maternal Grandfather    Alzheimer's disease Maternal Grandfather    Uterine cancer Paternal Grandmother     Vitals BP (!) 109/49 (BP Location: Left Arm)   Pulse 77   Temp (!) 100.5 F (38.1 C) (Oral)   Resp 20   Ht 5\' 5"  (1.651 m)   Wt 107.2 kg   SpO2 99%   BMI 39.33 kg/m    Physical Exam Constitutional:  morbidly obese critically ill female lying in the bed     Comments: on fentanyl, midazolam and IV abtx  Cardiovascular:     Rate and Rhythm: Normal rate and regular rhythm.     Heart sounds:   Pulmonary:     Effort: Pulmonary effort is normal on vent with Fio2 30%    Comments:   Abdominal:     Palpations: Abdomen is soft.     Tenderness: post surgical abdomen with a mid line wound vac and dark colored fluid in the canister, non bloody, soft  Musculoskeletal:        General: no pedal edema  Skin:    Comments: rt arm picc with no concerns   Neurological:     General: Pupils bilaterally symmetrical    Pertinent Microbiology Results for orders placed or performed during the hospital encounter of 10/23/22  MRSA Next Gen by PCR, Nasal     Status: None   Collection Time: 10/29/22  4:49 PM   Specimen: Nasal Mucosa; Nasal Swab  Result Value Ref Range Status   MRSA by PCR Next Gen NOT DETECTED NOT DETECTED Final    Comment: (NOTE) The GeneXpert MRSA Assay (FDA approved for NASAL specimens only), is one component of a comprehensive MRSA colonization surveillance program. It is not intended to diagnose MRSA infection nor to guide or monitor treatment for MRSA infections. Test  performance is not FDA approved in patients less than 3 years old. Performed at [facility], [address], [place], [place] [address]   Aerobic/Anaerobic Culture w Gram Stain (surgical/deep wound)     Status: None   Collection Time: 10/29/22  6:33 PM   Specimen: Path fluid; Body Fluid  Result Value Ref Range Status   Specimen Description FLUID ABDOMEN  Final   Special Requests SWAB ID [id number]  Final   Gram Stain   Final    NO WBC SEEN FEW GRAM NEGATIVE RODS RARE GRAM POSITIVE RODS RARE GRAM POSITIVE COCCI Performed at [facility], [address], [place], [place] [address]    Culture   Final    MODERATE STREPTOCOCCUS GROUP G Beta hemolytic streptococci are predictably susceptible to penicillin and other beta lactams. Susceptibility testing not routinely performed. MODERATE ACTINOMYCES SPECIES Standardized susceptibility testing for this organism is not available. ABUNDANT BACTEROIDES OVATUS BETA LACTAMASE POSITIVE MODERATE CANDIDA ALBICANS    Report Status 11/02/2022 FINAL  Final    Pertinent Lab seen by me:    Latest Ref Rng & Units 11/03/2022    2:46 AM 11/02/2022    6:05 AM 11/01/2022    5:00 AM  CBC  WBC 4.0 - 10.5 K/uL 19.3  18.9  17.6   Hemoglobin 12.0 - 15.0 g/dL 8.4  8.2  7.8   Hematocrit 36.0 - 46.0 % 29.2  27.8  26.6   Platelets 150 - 400 K/uL 353  413  364  Latest Ref Rng & Units 11/03/2022    2:50 AM 11/03/2022    2:46 AM 11/02/2022    6:05 AM  CMP  Glucose 70 - 99 mg/dL 284  132  440   BUN 6 - 20 mg/dL 21  21  22    Creatinine 0.44 - 1.00 mg/dL 1.02  7.25  3.66   Sodium 135 - 145 mmol/L 145  145  142   Potassium 3.5 - 5.1 mmol/L 3.9  3.9  4.2   Chloride 98 - 111 mmol/L 107  107  102   CO2 22 - 32 mmol/L 29  30  30    Calcium 8.9 - 10.3 mg/dL 7.4  7.4  7.3   Total Protein 6.5 - 8.1 g/dL   5.5   Total Bilirubin 0.3 - 1.2 mg/dL   0.2   Alkaline Phos 38 - 126 U/L   62   AST 15 - 41 U/L   20   ALT 0 - 44 U/L   14       Pertinent Imagings/Other Imagings Plain films and CT images have been personally visualized and interpreted; radiology reports have been reviewed. Decision making incorporated into the Impression / Recommendations.  DG Abd 1 View  Result Date: 10/29/2022 CLINICAL DATA:  OG tube EXAM: ABDOMEN - 1 VIEW COMPARISON:  None Available. FINDINGS: Orogastric tube tip is seen in the proximal stomach with sidehole at the level of the gastroesophageal junction. No dilated bowel loops are seen. There is left body wall soft tissue air. IMPRESSION: 1. Orogastric tube tip is seen in the proximal stomach with sidehole at the level of the gastroesophageal junction. Consider advancing tube 5 cm. 2. Left body wall soft tissue air of uncertain etiology. Please correlate clinically. Electronically Signed   By: Darliss Cheney M.D.   On: 10/29/2022 20:29   DG Chest Port 1 View  Result Date: 10/29/2022 CLINICAL DATA:  Intubated EXAM: PORTABLE CHEST 1 VIEW COMPARISON:  Chest x-ray 10/27/2022 FINDINGS: The endotracheal tube is 1.9 cm above the carina. Enteric tube extends below the diaphragm. The cardiomediastinal silhouette is within normal limits for projection. There is left basilar atelectasis. The lungs are otherwise clear. There is no pleural effusion or pneumothorax. No acute fractures are seen. IMPRESSION: 1. Endotracheal tube 1.9 cm above the carina. 2. Left basilar atelectasis. Electronically Signed   By: Darliss Cheney M.D.   On: 10/29/2022 20:26   [place] EKG SITE RITE  Result Date: 10/29/2022 If Site Rite image not attached, placement could not be confirmed due to current cardiac rhythm.  DG Chest Port 1 View  Result Date: 10/27/2022 CLINICAL DATA:  Fever EXAM: PORTABLE CHEST 1 VIEW COMPARISON:  06/12/2021 FINDINGS: Transverse diameter of heart is increased. There are no signs of pulmonary edema or focal pulmonary consolidation. There is no significant pleural effusion or pneumothorax. IMPRESSION: Cardiomegaly.  There are no signs of pulmonary edema or focal pulmonary consolidation. Electronically Signed   By: Ernie Avena M.D.   On: 10/27/2022 16:18     I have personally spent 96 minutes involved in face-to-face and non-face-to-face activities for this patient on the day of the visit. Professional time spent includes the following activities: Preparing to see the patient (review of tests), Obtaining and/or reviewing separately obtained history (admission/discharge record), Performing a medically appropriate examination and/or evaluation , Ordering medications/tests/procedures, referring and communicating with other health care professionals, Documenting clinical information in the EMR, Independently interpreting results (not separately reported), Communicating results to the patient/family/caregiver, Counseling and  educating the patient/family/caregiver and Care coordination (not separately reported).  Electronically signed by:   Plan d/w requesting provider as well as ID pharm D  Note: This document was prepared using dragon voice recognition software and may include unintentional dictation errors.   Odette Fraction, MD Infectious Disease Physician [facility] Pager: [phone]

## 2022-11-03 NOTE — Anesthesia Preprocedure Evaluation (Signed)
Anesthesia Evaluation  Patient identified by MRN, date of birth, ID band Patient unresponsive    Reviewed: Allergy & Precautions, NPO status , Patient's Chart, lab work & pertinent test results  History of Anesthesia Complications (+) PONV and history of anesthetic complications  Airway Mallampati: Intubated  TM Distance: >3 FB Neck ROM: Full    Dental  (+) Teeth Intact, Dental Advisory Given, Poor Dentition, Missing,    Pulmonary asthma       + intubated    Cardiovascular hypertension, Pt. on medications + Valvular Problems/Murmurs  Rhythm:Regular Rate:Normal   Echo 07/30/20: 1. Left ventricular ejection fraction, by estimation, is 60 to 65%. The left ventricle has normal function. Left ventricular endocardial border not optimally defined to evaluate regional wall motion. Left ventricular diastolic parameters were normal.   2. Right ventricular systolic function is normal. The right ventricular size is mildly enlarged. Tricuspid regurgitation signal is inadequate for assessing PA pressure.   3. The mitral valve was not well visualized. No evidence of mitral valve regurgitation.   4. The aortic valve was not well visualized. Aortic valve regurgitation is not visualized. Mild aortic valve sclerosis is present, with no evidence of aortic valve stenosis.   5. The inferior vena cava is normal in size with greater than 50% respiratory variability, suggesting right atrial pressure of 3 mmHg.     Neuro/Psych negative neurological ROS  negative psych ROS   GI/Hepatic Neg liver ROS,,,Perforated bowel s/p exlap now with open abdomen   Endo/Other  diabetes, Poorly Controlled, Type 2, Oral Hypoglycemic Agents  Morbid obesityHyperlipidemia  Renal/GU negative Renal ROS     Musculoskeletal negative musculoskeletal ROS (+)  Wound dehiscience   Abdominal  (+) + obese  Peds  Hematology  (+) Blood dyscrasia, anemia   Anesthesia Other  Findings   Reproductive/Obstetrics                             Anesthesia Physical Anesthesia Plan  ASA: 4  Anesthesia Plan: General   Post-op Pain Management: Dilaudid IV   Induction: Intravenous  PONV Risk Score and Plan: 4 or greater and Ondansetron, Dexamethasone, Treatment may vary due to age or medical condition, Midazolam and Droperidol  Airway Management Planned: Oral ETT  Additional Equipment:   Intra-op Plan:   Post-operative Plan: Post-operative intubation/ventilation  Informed Consent: I have reviewed the patients History and Physical, chart, labs and discussed the procedure including the risks, benefits and alternatives for the proposed anesthesia with the patient or authorized representative who has indicated his/her understanding and acceptance.     Dental advisory given  Plan Discussed with: CRNA  Anesthesia Plan Comments:         Anesthesia Quick Evaluation

## 2022-11-03 NOTE — Progress Notes (Signed)
1 Day Post-Op   Subjective/Chief Complaint: On vent No acute changes   Objective: Vital signs in last 24 hours: Temp:  [99.2 F (37.3 C)-101.8 F (38.8 C)] 99.7 F (37.6 C) (06/21 1330) Pulse Rate:  [65-89] 73 (06/21 1500) Resp:  [15-23] 20 (06/21 1500) BP: (101-185)/(46-79) 119/51 (06/21 1500) SpO2:  [94 %-100 %] 99 % (06/21 1500) FiO2 (%):  [28 %-30 %] 30 % (06/21 1228) Weight:  [107.2 kg] 107.2 kg (06/21 0500) Last BM Date : 10/28/22  Intake/Output from previous day: 06/20 0701 - 06/21 0700 In: 1517.1 [I.V.:1402.7; IV Piggyback:114.4] Out: 3270 [Urine:3170; Drains:100] Intake/Output this shift: Total I/O In: 754.1 [I.V.:503.8; IV Piggyback:250.3] Out: 900 [Urine:450; Emesis/NG output:100; Drains:350]  Exam: Intubated and sedated Abdomen soft, VAC in place  Lab Results:  Recent Labs    11/02/22 0605 11/03/22 0246  WBC 18.9* 19.3*  HGB 8.2* 8.4*  HCT 27.8* 29.2*  PLT 413* 353   BMET Recent Labs    11/03/22 0246 11/03/22 0250  NA 145 145  K 3.9 3.9  CL 107 107  CO2 30 29  GLUCOSE 139* 140*  BUN 21* 21*  CREATININE 0.79 0.86  CALCIUM 7.4* 7.4*   PT/INR No results for input(s): "LABPROT", "INR" in the last 72 hours. ABG No results for input(s): "PHART", "HCO3" in the last 72 hours.  Invalid input(s): "PCO2", "PO2"  Studies/Results: No results found.  Anti-infectives: Anti-infectives (From admission, onward)    Start     Dose/Rate Route Frequency Ordered Stop   11/04/22 1000  fluconazole (DIFLUCAN) IVPB 400 mg        400 mg 100 mL/hr over 120 Minutes Intravenous Every 24 hours 11/03/22 0801     11/03/22 0845  fluconazole (DIFLUCAN) IVPB 800 mg        800 mg 100 mL/hr over 240 Minutes Intravenous  Once 11/03/22 0759 11/03/22 1327   10/28/22 1000  azithromycin (ZITHROMAX) tablet 250 mg  Status:  Discontinued       See Hyperspace for full Linked Orders Report.   250 mg Oral Daily 10/27/22 0738 10/27/22 1653   10/27/22 1700   piperacillin-tazobactam (ZOSYN) IVPB 3.375 g        3.375 g 12.5 mL/hr over 240 Minutes Intravenous Every 8 hours 10/27/22 1653     10/27/22 1000  azithromycin (ZITHROMAX) tablet 500 mg       See Hyperspace for full Linked Orders Report.   500 mg Oral Daily 10/27/22 0738 10/27/22 0946   10/23/22 1054  ceFAZolin (ANCEF) 2-4 GM/100ML-% IVPB       Note to Pharmacy: Augustine Radar R: cabinet override      10/23/22 1054 10/23/22 2259   10/23/22 0600  ceFAZolin (ANCEF) IVPB 3g/100 mL premix        3 g 200 mL/hr over 30 Minutes Intravenous On call to O.R. 10/23/22 0540 10/23/22 1213       Assessment/Plan: Kaitlyn Bray underwent difficult robotic recurrent incisional hernia repair with mesh, bilateral posterior rectus myofascial release, bilateral transversus abdominis release and removal of previous polypropylene mesh with prolonged lyses of adhesions on 10/23/22. This was complicated by bowel perforation, possible missed enterotomy during dissection of small intestine off mesh, requiring return to the operating room with washout, small bowel resection and ABTHERA placement on 10/29/22.  Returned to the OR for wash out 6/18 and 6/20  ID consult for polymicrobial infection Possible return to the OR tomorrow to attempt closure Continue antibiotics  Appreciate CCM and ID involvement  Kaitlyn Bray  Magnus Ivan MD 11/03/2022

## 2022-11-03 NOTE — Progress Notes (Signed)
Nutrition Follow-up  DOCUMENTATION CODES:   Morbid obesity  INTERVENTION:  - TPN to increase tonight to 12mL/hr per pharmacy. Will provide 65g protein, 173g dextrose, 360 [place] kcals = 1206 kcals - TPN management per Pharmacy. TPN goal is 140mL/hr.   - Monitor magnesium, potassium, and phosphorus for at least 3 days.   - Daily weights while on TPN   NUTRITION DIAGNOSIS:   Inadequate oral intake related to inability to eat as evidenced by NPO status. *ongoing  GOAL:   Patient will meet greater than or equal to 90% of their needs *progressing, on TPN  MONITOR:   Vent status, Labs, Weight trends  REASON FOR ASSESSMENT:   Consult New TPN/TNA  ASSESSMENT:   57 y.o. female with PMH HTN and Diabetes who presented for robotic hernia repair now complicated by bowel perforation.  6/10 Admit, s/p hernia repair with lysis of adhesions 6/16 patient went to bathroom and had large amount of foul smelling fluid gush from abdominal wall; taken to surgery emergently -> found to have small bowel injury with perforation and contamination of abdominal cavity s/p ex-lap, resection of small bowel perf, ABThera wound VAC placed with open abdomen 6/18 ex-lap, washout of abdomen, ABThera wound VAC placement 6/20 repeat ex-lap, washout of abdomen, ABThera wound VAC placement   Patient intubated and sedated at time of visit. No family at bedside. NGT remains to LIS. TPN infusing at 40mL/hr, plan to increase to 58mL/hr this evening. Goal TPN per pharmacy is 123mL/hr.   Patient remains with open abdomen. Surgery planning to return to the OR again tomorrow to attempt closure. Will likely remain intubated until abdomen able to be closed.   Admit weight: 275# (reported?) -> 252# Current weight: 236# There is a 25# weight difference in the weight from yesterday to today, question accuracy.  Medications reviewed and include: Miralax Fentanyl Versed  Labs reviewed:  HA1C 8.5 Blood Glucose  117-143 x24 hours   Diet Order:   Diet Order             Diet NPO time specified  Diet effective midnight                   EDUCATION NEEDS:  Not appropriate for education at this time  Skin:  Skin Assessment: Reviewed RN Assessment Skin Integrity Issues:: Incisions Incisions: Abdomen  Last BM:  6/15  Height:  Ht Readings from Last 1 Encounters:  10/29/22 5\' 5"  (1.651 m)   Weight:  Wt Readings from Last 1 Encounters:  11/03/22 107.2 kg   Ideal Body Weight:  56.82 kg  BMI:  Body mass index is 39.33 kg/m.  Estimated Nutritional Needs:  Kcal:  1900-2200 kcals Protein:  115-145 grams Fluid:  >/= 1.9L    Shelle Iron RD, LDN For contact information, refer to [facility].

## 2022-11-03 NOTE — Progress Notes (Signed)
Pharmacy Antibiotic Note  Kaitlyn Bray is a 57 y.o. female admitted on 10/23/2022 with recurrent ventral hernia requiring repair then complicated by a bowel perforation requiring return to OR.  Has been on Zosyn since 6/14 for peritonitis.  Today, Pharmacy has been consulted for fluconazole dosing for candidemia.  Plan: Fluconazole 800 mg IV x 1 today then Fluconazole 400 mg IV every 24 hours With no dosing adjustments anticipated, Pharmacy will sign of fluconazole consult and continue to monitor clinical progress, renal function, C&S, LOT   Height: 5\' 5"  (165.1 cm) Weight: 107.2 kg (236 lb 5.3 oz) IBW/kg (Calculated) : 57  Temp (24hrs), Avg:100.3 F (37.9 C), Min:99.1 F (37.3 C), Max:101.8 F (38.8 C)  Recent Labs  Lab 10/29/22 2249 10/30/22 0303 10/30/22 1447 10/31/22 0346 11/01/22 0500 11/01/22 0606 11/02/22 0605 11/03/22 0246 11/03/22 0250  WBC 12.9* 19.3*  --  16.3* 17.6*  --  18.9* 19.3*  --   CREATININE 0.67 0.66   < > 0.74  --  0.80 0.71 0.79 0.86  LATICACIDVEN 1.5  --   --   --   --   --   --   --   --    < > = values in this interval not displayed.    Estimated Creatinine Clearance: 88.9 mL/min (by C-G formula based on SCr of 0.86 mg/dL).    Allergies  Allergen Reactions   Codeine Other (See Comments)    Fast heart rate   Latex Other (See Comments)    Redness from bandaids   Penicillins Rash   Sulfa Drugs Cross Reactors Rash    Antimicrobials this admission: 6/14 azithromycin x 1 6/10 cefazolin for surgical ppx 6/14 Zosyn >>  6/21 fluconazole >>   Microbiology results: 6/16 abdomen fluid: mod streptococcus group G, moderate actinomyces species, abundant bacteroides ovatus, moderate candida albucans 6/16 MRSA PCR: not detected  Thank you for allowing pharmacy to be a part of this patient's care.  Lynden Ang, PharmD, BCPS 11/03/2022 8:26 AM

## 2022-11-03 NOTE — Progress Notes (Signed)
PHARMACY - TOTAL PARENTERAL NUTRITION CONSULT NOTE   Indication: Prolonged ileus  Patient Measurements: Height: 5\' 5"  (165.1 cm) Weight: 107.2 kg (236 lb 5.3 oz) IBW/kg (Calculated) : 57 TPN AdjBW (KG): 71.4 Body mass index is 39.33 kg/m.  Assessment:  Pharmacy consulted to start TPN on a 57 yo pt with complicated robotic recurrent incisional hernia repair with mesh, bilateral posterior rectus myofascial release, bilateral transversus abdominis release and removal of previous polypropylene mesh with prolonged lyses of adhesions on 10/23/22. This was complicated by a bowel perforation requiring return to OR on 6/16, 6/18, and 6/20.   Glucose / Insulin:  - A1c on 6/3 is 8.5 - Pt on mSSI q4h  - CBGs have ranged 118-148 in past 24 hours ( target <150) - previous 24 hour insulin use: 8 units    Electrolytes:  - Magnesium remains elevated but steady at same level today at 2.7.   - Potassium has dropped to low end of normal limits - Remaining electrolytes WNL,including corrected calcium at 9.24 and Phosphorus which initially dropped after starting TPN but holding steady today at 2.8. Renal:  - Scr < 1 - BUN 21  Hepatic:  - WNL other than albumin low at 1.7 Intake / Output; MIVF:  - Not on mIVF - Urine output: 3105, drain: 550 - I/O Net: - 2236.9 GI Imaging: - 6/16 DG Abd for OG tube placement GI Surgeries / Procedures:  - 6/10 Robotic incisional hernia repair - 6/16 exploratory laparotomy, repair of small bowel perforation  - 6/18 exploratory laparotomy, wash out of abdomen and wound vac placement  - 6/20 exploratory laparotomy, wound wash out of abdomen and wound vac change    Central access: PICC  TPN start date: 11/01/22  Nutritional Goals: Goal TPN rate is 100 mL/hr (provides 108 g of protein and 2011 kcals per day)  RD Assessment: Estimated Needs Total Energy Estimated Needs: 1900-2200 kcals Total Protein Estimated Needs: 115-145 grams Total Fluid Estimated Needs: >/=  1.9L  Current Nutrition:  NPO  Plan:  Increase TPN to 60 mL/hr at 1800 Electrolytes in TPN:  Na 50 mEq/L K 40 mEq/L - increase Ca 5 mEq/L  Mg 0 mEq/L - continue to remove Phos 15 mmol/L Cl:Ac 1:1 Add standard MVI and trace elements to TPN Continue Moderate q4h SSI and adjust as needed  Not on maintenance fluids  BMP, magnesium, phosphorus with AM labs  Monitor TPN labs on Mon/Thurs   Thank you for allowing pharmacy to be a part of this patient's care.  Selinda Eon, PharmD, BCPS Clinical Pharmacist [facility] 11/03/2022 7:28 AM

## 2022-11-03 NOTE — TOC Progression Note (Addendum)
Transition of Care [facility]) - Progression Note    Patient Details  Name: Kaitlyn Bray MRN: [id number] Date of Birth: 10/15/1965  [facility] CM/SW Contact  Lavenia Atlas, RN Phone Number: 11/03/2022, 6:12 PM  Clinical Narrative:   Per chart review patient remains intubated, post op EXPLORATORY LAPAROTOMY WASH OUT OF ABDOMEN ABTHERA WOUND VAC PLACEMENT. No current TOC needs.  Following for discharge needs.    Barriers to Discharge: Continued Medical Work up  Expected Discharge Plan and Services    unknown                                           Social Determinants of Health (SDOH) Interventions SDOH Screenings   Food Insecurity: No Food Insecurity (10/23/2022)  Housing: Low Risk  (10/23/2022)  Transportation Needs: No Transportation Needs (10/23/2022)  Utilities: Not At Risk (10/23/2022)  Depression (PHQ2-9): Low Risk  (10/10/2022)  Tobacco Use: Low Risk  (11/03/2022)    Readmission Risk Interventions     No data to display

## 2022-11-03 NOTE — Anesthesia Postprocedure Evaluation (Signed)
Anesthesia Post Note  Patient: Kaitlyn Bray  Procedure(s) Performed: EXPLORATORY LAPAROTOMY - WASH OUT OF ABDOMEN AND WOUND VAC PLACEMENT     Patient location during evaluation: SICU Anesthesia Type: General Level of consciousness: sedated Pain management: pain level controlled Vital Signs Assessment: post-procedure vital signs reviewed and stable Respiratory status: patient remains intubated per anesthesia plan Cardiovascular status: stable Postop Assessment: no apparent nausea or vomiting Anesthetic complications: no   No notable events documented.  Last Vitals:  Vitals:   11/03/22 1200 11/03/22 1228  BP: (!) 109/53   Pulse: 66   Resp: 20   Temp: (!) 38.2 C   SpO2: 100% 100%    Last Pain:  Vitals:   11/03/22 1200  TempSrc: Axillary  PainSc:                  Kennieth Rad

## 2022-11-04 ENCOUNTER — Encounter (HOSPITAL_COMMUNITY)
Admission: RE | Disposition: A | Discharge status: Rehabilitation Facility | Payer: Self-pay | Source: Home / Self Care | Attending: Surgery

## 2022-11-04 ENCOUNTER — Other Ambulatory Visit: Payer: Self-pay

## 2022-11-04 ENCOUNTER — Inpatient Hospital Stay (HOSPITAL_COMMUNITY): Payer: 59 | Admitting: Certified Registered Nurse Anesthetist

## 2022-11-04 DIAGNOSIS — J45909 Unspecified asthma, uncomplicated: Secondary | ICD-10-CM

## 2022-11-04 DIAGNOSIS — Z7984 Long term (current) use of oral hypoglycemic drugs: Secondary | ICD-10-CM

## 2022-11-04 DIAGNOSIS — T8149XA Infection following a procedure, other surgical site, initial encounter: Secondary | ICD-10-CM | POA: Diagnosis not present

## 2022-11-04 DIAGNOSIS — I10 Essential (primary) hypertension: Secondary | ICD-10-CM

## 2022-11-04 DIAGNOSIS — K432 Incisional hernia without obstruction or gangrene: Secondary | ICD-10-CM | POA: Diagnosis not present

## 2022-11-04 DIAGNOSIS — E1165 Type 2 diabetes mellitus with hyperglycemia: Secondary | ICD-10-CM | POA: Diagnosis not present

## 2022-11-04 HISTORY — PX: DEBRIDEMENT AND CLOSURE WOUND: SHX5614

## 2022-11-04 HISTORY — PX: APPLICATION OF WOUND VAC: SHX5189

## 2022-11-04 LAB — CBC WITH DIFFERENTIAL/PLATELET
Abs Immature Granulocytes: 1.1 10*3/uL — ABNORMAL HIGH (ref 0.00–0.07)
Basophils Absolute: 0 10*3/uL (ref 0.0–0.1)
Basophils Relative: 0 %
Eosinophils Absolute: 0.8 10*3/uL — ABNORMAL HIGH (ref 0.0–0.5)
Eosinophils Relative: 3 %
HCT: 32.7 % — ABNORMAL LOW (ref 36.0–46.0)
Hemoglobin: 9.4 g/dL — ABNORMAL LOW (ref 12.0–15.0)
Lymphocytes Relative: 12 %
Lymphs Abs: 3.2 10*3/uL (ref 0.7–4.0)
MCH: 27.7 pg (ref 26.0–34.0)
MCHC: 28.7 g/dL — ABNORMAL LOW (ref 30.0–36.0)
MCV: 96.5 fL (ref 80.0–100.0)
Monocytes Absolute: 0.3 10*3/uL (ref 0.1–1.0)
Monocytes Relative: 1 %
Myelocytes: 4 %
Neutro Abs: 21.4 10*3/uL — ABNORMAL HIGH (ref 1.7–7.7)
Neutrophils Relative %: 80 %
Platelets: 438 10*3/uL — ABNORMAL HIGH (ref 150–400)
RBC: 3.39 MIL/uL — ABNORMAL LOW (ref 3.87–5.11)
RDW: 15.4 % (ref 11.5–15.5)
WBC: 26.7 10*3/uL — ABNORMAL HIGH (ref 4.0–10.5)
nRBC: 0.2 % (ref 0.0–0.2)

## 2022-11-04 LAB — PHOSPHORUS: Phosphorus: 6.1 mg/dL — ABNORMAL HIGH (ref 2.5–4.6)

## 2022-11-04 LAB — BASIC METABOLIC PANEL
Anion gap: 10 (ref 5–15)
BUN: 21 mg/dL — ABNORMAL HIGH (ref 6–20)
CO2: 28 mmol/L (ref 22–32)
Calcium: 6.9 mg/dL — ABNORMAL LOW (ref 8.9–10.3)
Chloride: 105 mmol/L (ref 98–111)
Creatinine, Ser: 0.78 mg/dL (ref 0.44–1.00)
GFR, Estimated: >60 mL/min (ref >60)
Glucose, Bld: 124 mg/dL — ABNORMAL HIGH (ref 70–99)
Potassium: 3.6 mmol/L (ref 3.5–5.1)
Sodium: 143 mmol/L (ref 135–145)

## 2022-11-04 LAB — MAGNESIUM: Magnesium: 2.3 mg/dL (ref 1.7–2.4)

## 2022-11-04 LAB — PREPARE RBC (CROSSMATCH)

## 2022-11-04 LAB — GLUCOSE, CAPILLARY
Glucose-Capillary: 129 mg/dL — ABNORMAL HIGH (ref 70–99)
Glucose-Capillary: 142 mg/dL — ABNORMAL HIGH (ref 70–99)
Glucose-Capillary: 172 mg/dL — ABNORMAL HIGH (ref 70–99)
Glucose-Capillary: 193 mg/dL — ABNORMAL HIGH (ref 70–99)
Glucose-Capillary: 206 mg/dL — ABNORMAL HIGH (ref 70–99)
Glucose-Capillary: 235 mg/dL — ABNORMAL HIGH (ref 70–99)

## 2022-11-04 LAB — BPAM RBC
Blood Product Expiration Date: 202407142359
Unit Type and Rh: 6200

## 2022-11-04 LAB — TYPE AND SCREEN: Antibody Screen: NEGATIVE

## 2022-11-04 LAB — CULTURE, BLOOD (ROUTINE X 2)

## 2022-11-04 SURGERY — DEBRIDEMENT, WOUND, WITH CLOSURE
Anesthesia: General

## 2022-11-04 MED ORDER — VANCOMYCIN HCL 2000 MG/400ML IV SOLN
2000.0000 mg | INTRAVENOUS | Status: AC
  Administered 2022-11-04: 2000 mg via INTRAVENOUS
  Filled 2022-11-04: qty 400

## 2022-11-04 MED ORDER — TRAVASOL 10 % IV SOLN
INTRAVENOUS | Status: AC
  Filled 2022-11-04: qty 648

## 2022-11-04 MED ORDER — VANCOMYCIN HCL 1750 MG/350ML IV SOLN
1750.0000 mg | INTRAVENOUS | Status: DC
  Administered 2022-11-05 – 2022-11-07 (×3): 1750 mg via INTRAVENOUS
  Filled 2022-11-04 (×4): qty 350

## 2022-11-04 MED ORDER — ALBUMIN HUMAN 5 % IV SOLN: INTRAVENOUS | Status: DC | PRN

## 2022-11-04 MED ORDER — FENTANYL CITRATE (PF) 250 MCG/5ML IJ SOLN
INTRAMUSCULAR | Status: AC
  Filled 2022-11-04: qty 5

## 2022-11-04 MED ORDER — SODIUM CHLORIDE 0.9 % IV SOLN
400.0000 mg | Freq: Once | INTRAVENOUS | Status: AC
  Administered 2022-11-04: 400 mg via INTRAVENOUS
  Filled 2022-11-04: qty 4

## 2022-11-04 MED ORDER — LIDOCAINE 2% (20 MG/ML) 5 ML SYRINGE: INTRAMUSCULAR | Status: DC | PRN

## 2022-11-04 MED ORDER — PHENYLEPHRINE HCL-NACL 20-0.9 MG/250ML-% IV SOLN
INTRAVENOUS | Status: DC | PRN
  Administered 2022-11-04: 30 ug/min via INTRAVENOUS

## 2022-11-04 MED ORDER — ROCURONIUM BROMIDE 10 MG/ML (PF) SYRINGE
PREFILLED_SYRINGE | INTRAVENOUS | Status: DC | PRN
  Administered 2022-11-04: 70 mg via INTRAVENOUS
  Administered 2022-11-04: 50 mg via INTRAVENOUS
  Administered 2022-11-04 (×2): 30 mg via INTRAVENOUS

## 2022-11-04 MED ORDER — FENTANYL CITRATE (PF) 250 MCG/5ML IJ SOLN
INTRAMUSCULAR | Status: DC | PRN
  Administered 2022-11-04 (×2): 50 ug via INTRAVENOUS

## 2022-11-04 MED ORDER — FUROSEMIDE 10 MG/ML IJ SOLN
40.0000 mg | Freq: Once | INTRAMUSCULAR | Status: AC
  Administered 2022-11-04: 40 mg via INTRAVENOUS
  Filled 2022-11-04: qty 4

## 2022-11-04 MED ORDER — POTASSIUM CHLORIDE 10 MEQ/50ML IV SOLN: 10.0000 meq | INTRAVENOUS | Status: DC

## 2022-11-04 MED ORDER — 0.9 % SODIUM CHLORIDE (POUR BTL) OPTIME
TOPICAL | Status: DC | PRN
  Administered 2022-11-04 (×2): 2000 mL

## 2022-11-04 MED ORDER — POTASSIUM CHLORIDE 10 MEQ/50ML IV SOLN
10.0000 meq | INTRAVENOUS | Status: AC
  Administered 2022-11-04 (×4): 10 meq via INTRAVENOUS
  Filled 2022-11-04 (×4): qty 50

## 2022-11-04 MED ORDER — LACTATED RINGERS IV SOLN: INTRAVENOUS | Status: DC | PRN

## 2022-11-04 SURGICAL SUPPLY — 46 items
APL PRP STRL LF DISP 70% ISPRP (MISCELLANEOUS)
BAG COUNTER SPONGE SURGICOUNT (BAG) IMPLANT
BAG SPNG CNTER NS LX DISP (BAG)
CANISTER WOUND CARE 500ML ATS (WOUND CARE) IMPLANT
CHLORAPREP W/TINT 26 (MISCELLANEOUS) IMPLANT
COVER MAYO STAND STRL (DRAPES) ×1 IMPLANT
COVER SURGICAL LIGHT HANDLE (MISCELLANEOUS) ×1 IMPLANT
DRAIN CHANNEL 19F RND (DRAIN) IMPLANT
DRAPE LAPAROSCOPIC ABDOMINAL (DRAPES) ×1 IMPLANT
DRSG OPSITE POSTOP 4X10 (GAUZE/BANDAGES/DRESSINGS) IMPLANT
DRSG OPSITE POSTOP 4X6 (GAUZE/BANDAGES/DRESSINGS) IMPLANT
DRSG OPSITE POSTOP 4X8 (GAUZE/BANDAGES/DRESSINGS) IMPLANT
DRSG VAC GRANUFOAM LG (GAUZE/BANDAGES/DRESSINGS) IMPLANT
DRSG VERSA FOAM LRG 10X15 (GAUZE/BANDAGES/DRESSINGS) IMPLANT
ELECT REM PT RETURN 15FT ADLT (MISCELLANEOUS) ×1 IMPLANT
EVACUATOR SILICONE 100CC (DRAIN) IMPLANT
GLOVE BIO SURGEON STRL SZ 6 (GLOVE) ×2 IMPLANT
GLOVE INDICATOR 6.5 STRL GRN (GLOVE) ×2 IMPLANT
GOWN STRL REUS W/ TWL XL LVL3 (GOWN DISPOSABLE) ×1 IMPLANT
GOWN STRL REUS W/TWL XL LVL3 (GOWN DISPOSABLE) ×1
GRASPER SUT TROCAR 14GX15 (MISCELLANEOUS) IMPLANT
KIT TURNOVER KIT A (KITS) IMPLANT
MESH PHASIX ST 30X35 (Mesh General) IMPLANT
MESH VICRYL KNITTED 12X12 (Mesh General) IMPLANT
NDL MAYO 6 CRC TAPER PT (NEEDLE) IMPLANT
NEEDLE MAYO 6 CRC TAPER PT (NEEDLE) ×1 IMPLANT
PACK GENERAL/GYN (CUSTOM PROCEDURE TRAY) ×1 IMPLANT
STAPLER VISISTAT 35W (STAPLE) IMPLANT
SUT ETHILON 2 0 PS N (SUTURE) IMPLANT
SUT ETHILON 3 0 PS 1 (SUTURE) IMPLANT
SUT MNCRL AB 4-0 PS2 18 (SUTURE) IMPLANT
SUT NOVA T20/GS 25 (SUTURE) IMPLANT
SUT SILK 2 0 (SUTURE) ×1
SUT SILK 2 0 SH CR/8 (SUTURE) ×1 IMPLANT
SUT SILK 2-0 18XBRD TIE 12 (SUTURE) ×1 IMPLANT
SUT SILK 3 0 (SUTURE) ×1
SUT SILK 3 0 SH CR/8 (SUTURE) ×1 IMPLANT
SUT SILK 3-0 18XBRD TIE 12 (SUTURE) ×1 IMPLANT
SUT VIC AB 0 CT1 27 (SUTURE) ×13
SUT VIC AB 0 CT1 27XBRD ANBCTR (SUTURE) IMPLANT
SUT VIC AB 2-0 SH 27 (SUTURE) ×5
SUT VIC AB 2-0 SH 27XBRD (SUTURE) IMPLANT
TOWEL OR 17X26 10 PK STRL BLUE (TOWEL DISPOSABLE) IMPLANT
TOWEL OR NON WOVEN STRL DISP B (DISPOSABLE) ×1 IMPLANT
TRAY FOLEY MTR SLVR 14FR STAT (SET/KITS/TRAYS/PACK) ×1 IMPLANT
TRAY FOLEY MTR SLVR 16FR STAT (SET/KITS/TRAYS/PACK) ×1 IMPLANT

## 2022-11-04 NOTE — Op Note (Signed)
Patient: Kaitlyn Bray (09/29/1965, [id number])  Date of Surgery: 11/04/22  Preoperative Diagnosis: Open abdomen   Postoperative Diagnosis: Open abdomen   Surgical Procedure:  RETROMUSCULAR ABSORBABLE MESH BRIDGING CLOSURE OF ABDOMEN:  APPLICATION OF WOUND TO VAC 25CM X 15 CM X 4 CM LOWER MIDLINE ABDOMINAL WOUND  Operative Team Members:  Surgeon(s) and Role:    * Taeler Winning, Hyman Hopes, MD - Primary    * Abigail Miyamoto, MD - Assisting   Anesthesiologist: [name], MD CRNA: Ponciano Ort, CRNA   Anesthesia: General   Fluids:  Total I/O In: 850 [I.V.:600; IV Piggyback:250] Out: 410 [Urine:160; Blood:250]  Complications: None  Drains:   19 Fr JP drain entering RUQ and draining right pericolic gutter 19 Fr JP drain entering the left upper quadrant and draining the left pericolic gutter   Specimen: None  Disposition:  PACU - hemodynamically stable.  Plan of Care:  Continue ICU care, okay for extubation when clinically ready    Indications for Procedure: Kaitlyn Bray is a 57 y.o. female who presented with a recurrent incisional hernia.  She underwent difficult robotic recurrent incisional hernia repair with removal of previous intraperitoneal mesh, bilateral transversus abdominis release, bilateral posterior rectus myofascial release, and Vicryl mesh recreation of the peritoneal sac and retrorectus Bard soft bridging mesh placement for abdominal wall reconstruction.  She likely suffered a partial-thickness bowel injury during the lysis of the small intestine off the intraperitoneal mesh.  This developed into peritonitis during her postoperative stay and she returned to the operating room for exploration.  A bowel injury was identified, and a small bowel resection was performed.  Mesh was partially removed at that time, but the abdomen was left open for second look.  She returned the operating room multiple times for abdominal washout, and after discussion with the  husband and informed consent obtained we recommend return to the operating room today for abdominal washout, possible bridged absorbable mesh closure of the abdomen.  I had a full discussion with the patient's husband and all questions were answered and we proceed to the operating room.  Findings: The remainder of the Bard soft mesh was removed from the abdomen, the bowel anastomosis appeared to be intact and there appeared to be no significant undrained fluid collections or other pelvis injury within the abdomen.  The abdomen was washed out and the abdomen was closed using Vicryl mesh to recreate the peritoneal sac and closed the posterior rectus fascia in the midline, and a 35 cm x 30 cm piece of phasic's mesh in the retromuscular space to bridge the abdomen closed with a wound VAC to close the 25 x 15 cm wound with 4 pieces of white sponge overlying the mesh 1 piece of black sponge over the white sponge and 125 mmHg of suction.   Description of Procedure:   On the date see above the patient taken operating suite.  General anesthesia was induced.  Antibiotics were given a scheduled fashion.  Patient then was prepped draped in usual sterile fashion.  The ABThera temporary abdominal closure device was removed.  The abdomen was inspected.  There were inflamed loops of intestine adherent to the underside of the VAC.  Wound finger fracture through this carefully to inspect the anastomosis.  It appeared intact, the mesenteric closure appeared intact.  The small bowel was run for short distance and appeared intact.  Identified the remainder of the Bard soft mesh.  This was divided from its transfascial suture fixation and removed  from the abdomen in total.  Fragment of the previously placed Vicryl mesh was still placed beneath this and had curled up on the edges of the peritoneal closure.  It did not appear to be adherent to the intestine and was able to be incorporated into the peritoneal closure again.  The  abdomen was irrigated in all 4 quadrants and suctioned clean.  2 JP drains were brought in through the right and left upper quadrants.  These were 19 [place] JP drains and they were placed in the right and left paracolic gutter drain straight down toward the pelvis.  The visceral sac was then recreated by 73 cm x 3 cm mesh loosely to the edge of the posterior rectus muscle bilaterally and the peritoneal edge superiorly and inferiorly.  This effectively created the visceral sac and retracted the bowel out of the way as we placed the phasic's mesh in the retromuscular space.  12 transfascial sutures were placed circumferentially through the lateral abdominal wall musculature, along the subcostal margin, and to Cooper's ligament bilaterally.  These were used parachuting a piece of 35 cm long by 30 cm wide mesh into the retromuscular space.  The mesh was turned on side to lengthen the superior to inferior distance of the mesh, placing one quarter at the subxiphoid region and the opposite corner at the Cooper's ligament.  The mesh felt well fixated within the abdomen bridging the large fascial defect closed in the retromuscular position.  A wound VAC was then applied over this.  The wound measured 25 cm tall by 15 cm wide by 4 cm deep.  4 pieces of white sponge were used to cover the mesh so as to not apply suction straight through the mesh to the intestines.  A black sponge was placed over this.  The Tegaderm was placed over this and suction was applied.  The [facility] held to seal well.  The patient was transferred to the ICU intubated for ongoing care.  At the end of the case we reviewed the infection status of the case. Patient:  Private patient urgent case Case: Urgent Infection Present At Time Of Surgery (PATOS):  Contamination of the abdomen and subcutaneous space from bowel perforation with evacuation through the skin and temporary abdominal closure with multiple previous washouts.  Kaitlyn Drape,  MD General, Bariatric, & Minimally Invasive Surgery [facility], [place]

## 2022-11-04 NOTE — Progress Notes (Signed)
2 Days Post-Op   Subjective/Chief Complaint: On vent Fevers overnight   Objective: Vital signs in last 24 hours: Temp:  [99.7 F (37.6 C)-103.6 F (39.8 C)] 102 F (38.9 C) (06/22 0635) Pulse Rate:  [65-86] 70 (06/22 0606) Resp:  [12-32] 23 (06/22 0606) BP: (101-127)/(43-59) 112/52 (06/22 0606) SpO2:  [94 %-100 %] 99 % (06/22 0606) FiO2 (%):  [28 %-30 %] 30 % (06/22 0323) Weight:  [105 kg] 105 kg (06/22 0500) Last BM Date : 10/28/22  Intake/Output from previous day: 06/21 0701 - 06/22 0700 In: 2188.7 [I.V.:1735.1; IV Piggyback:453.5] Out: 3775 [Urine:2800; Emesis/NG output:300; Drains:675] Intake/Output this shift: Total I/O In: 1214.9 [I.V.:1033.7; IV Piggyback:181.2] Out: 1925 [Urine:1500; Emesis/NG output:200; Drains:225]  Exam: Intubated and sedated Abdomen soft, VAC in place  Lab Results:  Recent Labs    11/02/22 0605 11/03/22 0246  WBC 18.9* 19.3*  HGB 8.2* 8.4*  HCT 27.8* 29.2*  PLT 413* 353    BMET Recent Labs    11/03/22 0246 11/03/22 0250  NA 145 145  K 3.9 3.9  CL 107 107  CO2 30 29  GLUCOSE 139* 140*  BUN 21* 21*  CREATININE 0.79 0.86  CALCIUM 7.4* 7.4*    PT/INR No results for input(s): "LABPROT", "INR" in the last 72 hours. ABG No results for input(s): "PHART", "HCO3" in the last 72 hours.  Invalid input(s): "PCO2", "PO2"  Studies/Results: No results found.  Anti-infectives: Anti-infectives (From admission, onward)    Start     Dose/Rate Route Frequency Ordered Stop   11/04/22 1000  fluconazole (DIFLUCAN) IVPB 400 mg        400 mg 100 mL/hr over 120 Minutes Intravenous Every 24 hours 11/03/22 0801     11/03/22 0845  fluconazole (DIFLUCAN) IVPB 800 mg        800 mg 100 mL/hr over 240 Minutes Intravenous  Once 11/03/22 0759 11/03/22 1327   10/28/22 1000  azithromycin (ZITHROMAX) tablet 250 mg  Status:  Discontinued       See Hyperspace for full Linked Orders Report.   250 mg Oral Daily 10/27/22 0738 10/27/22 1653    10/27/22 1700  piperacillin-tazobactam (ZOSYN) IVPB 3.375 g        3.375 g 12.5 mL/hr over 240 Minutes Intravenous Every 8 hours 10/27/22 1653     10/27/22 1000  azithromycin (ZITHROMAX) tablet 500 mg       See Hyperspace for full Linked Orders Report.   500 mg Oral Daily 10/27/22 0738 10/27/22 0946   10/23/22 1054  ceFAZolin (ANCEF) 2-4 GM/100ML-% IVPB       Note to Pharmacy: Augustine Radar R: cabinet override      10/23/22 1054 10/23/22 2259   10/23/22 0600  ceFAZolin (ANCEF) IVPB 3g/100 mL premix        3 g 200 mL/hr over 30 Minutes Intravenous On call to O.R. 10/23/22 0540 10/23/22 1213       Assessment/Plan: Ms. Rando underwent difficult robotic recurrent incisional hernia repair with mesh, bilateral posterior rectus myofascial release, bilateral transversus abdominis release and removal of previous polypropylene mesh with prolonged lyses of adhesions on 10/23/22. This was complicated by bowel perforation, possible missed enterotomy during dissection of small intestine off mesh, requiring return to the operating room with washout, small bowel resection and ABTHERA placement on 10/29/22.  Returned to the OR for wash out 6/18 and 6/20  ID consult for polymicrobial infection Return to OR today to evaluate temporary abdominal closure, condition of the abdominal cavity and consider  abdominal closure Continue antibiotics  Appreciate CCM and ID involvement  [name], MD  11/04/2022

## 2022-11-04 NOTE — Progress Notes (Signed)
ID Brief note  Continues to have fevers, T max 103.6 Blood cx 6/21 NG in less than 24 hrs  No CBC available and will order   Plan for OR today noted and recommend getting additional cultures as appropriate to guide antibiotics.  Will order Vancomycin, pharmacy to dose pending blood cx results   Odette Fraction, MD Infectious Disease Physician [facility] [address] [place], [place] [address] Phone: [phone]  Fax: [phone]

## 2022-11-04 NOTE — Anesthesia Postprocedure Evaluation (Signed)
Anesthesia Post Note  Patient: Kaitlyn Bray  Procedure(s) Performed: ABSORBABLE MESH CLOSURE OF ABDOMEN APPLICATION OF WOUND VAC     Patient location during evaluation: ICU Anesthesia Type: General Level of consciousness: sedated and patient remains intubated per anesthesia plan Pain management: pain level controlled Vital Signs Assessment: post-procedure vital signs reviewed and stable Respiratory status: patient on ventilator - see flowsheet for VS and patient remains intubated per anesthesia plan Cardiovascular status: stable Anesthetic complications: no   No notable events documented.  Last Vitals:  Vitals:   11/04/22 1215 11/04/22 1230  BP:    Pulse: 87 86  Resp: 20 20  Temp:    SpO2: 96% 96%    Last Pain:  Vitals:   11/04/22 1200  TempSrc: Axillary  PainSc:                  Kaitlyn Bray

## 2022-11-04 NOTE — Progress Notes (Signed)
PHARMACY - TOTAL PARENTERAL NUTRITION CONSULT NOTE   Indication: Prolonged ileus  Patient Measurements: Height: 5\' 5"  (165.1 cm) Weight: 105 kg (231 lb 7.7 oz) IBW/kg (Calculated) : 57 TPN AdjBW (KG): 71.4 Body mass index is 38.52 kg/m.  Assessment:  Pharmacy consulted to start TPN on a 57 yo pt with complicated robotic recurrent incisional hernia repair with mesh, bilateral posterior rectus myofascial release, bilateral transversus abdominis release and removal of previous polypropylene mesh with prolonged lyses of adhesions on 10/23/22. This was complicated by a bowel perforation requiring return to OR on 6/16, 6/18, and 6/20.   Glucose / Insulin:  - A1c on 6/3 is 8.5 - Pt on mSSI q4h  - CBGs have ranged 124-142 in past 24 hours ( target <150) - previous 24 hour insulin use: 12 units    Electrolytes:  - Potassium low at 3.6-provider already ordered supplementation this am of KCl 10 mEq q1h x 4 - Magnesium now WNL (removed from TPN on 6/20 & 6/21) - Phosphorus elevated at 6.1 - TPN rate increased from 40 to 60 ml/hr yesterday, other explanation might be acute phosphate load from tissue breakdown - Remaining electrolytes WNL,including corrected calcium at 9.06 Renal:  - Scr < 1 - BUN steady at 21  Hepatic:  - WNL other than albumin low at 1.7 Intake / Output; MIVF:  - Not on mIVF - Urine output: 2920, drain: 575 - I/O Net: - 1629.8 GI Imaging: - 6/16 DG Abd for OG tube placement GI Surgeries / Procedures:  - 6/10 Robotic incisional hernia repair - 6/16 exploratory laparotomy, repair of small bowel perforation  - 6/18 exploratory laparotomy, wash out of abdomen and wound vac placement  - 6/20 exploratory laparotomy, wound wash out of abdomen and wound vac change  -6/22 absorbable mesh closure of abdomen, application of wound vac   Central access: PICC  TPN start date: 11/01/22  Nutritional Goals: Goal TPN rate is 100 mL/hr (provides 108 g of protein and 2011 kcals per  day)  RD Assessment: Estimated Needs Total Energy Estimated Needs: 1900-2200 kcals Total Protein Estimated Needs: 115-145 grams Total Fluid Estimated Needs: >/= 1.9L  Current Nutrition:  NPO  Plan:  Continue TPN at 60 mL/hr at 1800 Electrolytes in TPN:  Na 50 mEq/L K 50 mEq/L - increase Ca 5 mEq/L  Mg 0 mEq/L - continue to remove Phos 0 mmol/L - remove Cl:Ac 1:1 Add standard MVI and trace elements to TPN Continue Moderate q4h SSI and adjust as needed  Not on maintenance fluids  Monitor TPN labs tomorrow and on Mon/Thurs   Thank you for allowing pharmacy to be a part of this patient's care.  Selinda Eon, PharmD, BCPS Clinical Pharmacist [facility] 11/04/2022 7:52 AM

## 2022-11-04 NOTE — Progress Notes (Signed)
Pharmacy Antibiotic Note  Kaitlyn Bray is a 57 y.o. female admitted on 10/23/2022 with  Peritonitis, Possible PICC infection .  Pharmacy has been consulted for Vanco  dosing.  ID: Peritonitis due to bowel perforation. Possible PICC line infection. F/u cultures - Tmax 103.6>>102 currently. WBC 19.3, Scr 0.78  6/10 Cefazolin x 2 doses 6/14 Azithro x 1 6/14 Zosyn >> 6/21 fluconazole >> 6/22 Vanco>>  6/16 abdomen fluid  Cx: moderate Strep group G, actionmyces 6/21: BC x 2>> 6/16 MRSA PCR: neg  Plan: Vanco 2g IV x 1 load then Vancomycin 1750 mg IV Q 24 hrs. Goal AUC 400-550. Expected AUC: 528 SCr used: 0.8    Height: 5\' 5"  (165.1 cm) Weight: 105 kg (231 lb 7.7 oz) IBW/kg (Calculated) : 57  Temp (24hrs), Avg:101.1 F (38.4 C), Min:99.7 F (37.6 C), Max:103.6 F (39.8 C)  Recent Labs  Lab 10/29/22 2249 10/30/22 0303 10/30/22 1447 10/31/22 0346 11/01/22 0500 11/01/22 0606 11/02/22 0605 11/03/22 0246 11/03/22 0250 11/04/22 0635  WBC 12.9* 19.3*  --  16.3* 17.6*  --  18.9* 19.3*  --   --   CREATININE 0.67 0.66   < > 0.74  --  0.80 0.71 0.79 0.86 0.78  LATICACIDVEN 1.5  --   --   --   --   --   --   --   --   --    < > = values in this interval not displayed.    Estimated Creatinine Clearance: 94.5 mL/min (by C-G formula based on SCr of 0.78 mg/dL).    Allergies  Allergen Reactions   Codeine Other (See Comments)    Fast heart rate   Latex Other (See Comments)    Redness from bandaids   Penicillins Rash   Sulfa Drugs Cross Reactors Rash    Christine Schiefelbein S. Merilynn Finland, PharmD, BCPS Clinical Staff Pharmacist [email or web address] Pasty Spillers 11/04/2022 9:46 AM

## 2022-11-04 NOTE — Progress Notes (Signed)
eLink Physician-Brief Progress Note Patient Name: Kaitlyn Bray DOB: [birth year] MRN: [id number]   Date of Service  11/04/2022  HPI/Events of Note  Temp 103.6, had blood cultures yesterday and has also had wound cultures, temp not controlled by rectal tylenol.  eICU Interventions  IV Ibuprofen + cooling blanket ordered,        Kaitlyn Bray U Kaitlyn Bray 11/04/2022, 5:09 AM

## 2022-11-04 NOTE — Transfer of Care (Signed)
Immediate Anesthesia Transfer of Care Note  Patient: Kaitlyn Bray  Procedure(s) Performed: ABSORBABLE MESH CLOSURE OF ABDOMEN APPLICATION OF WOUND VAC  Patient Location: ICU  Anesthesia Type:General  Level of Consciousness: Patient remains intubated per anesthesia plan  Airway & Oxygen Therapy: Patient remains intubated per anesthesia plan and Patient placed on Ventilator (see vital sign flow sheet for setting)  Post-op Assessment: Report given to RN and Post -op Vital signs reviewed and stable  Post vital signs: Reviewed and stable  Last Vitals:  Vitals Value Taken Time  BP    Temp    Pulse    Resp    SpO2 100 % 11/04/22 1101    Last Pain:  Vitals:   11/04/22 0635  TempSrc: Rectal  PainSc:       Patients Stated Pain Goal: 0 (10/29/22 1000)  Complications: No notable events documented.

## 2022-11-04 NOTE — Progress Notes (Signed)
NAME:  Kaitlyn Bray, MRN:  [id number], DOB:  07-18-1965, LOS: 12 ADMISSION DATE:  10/23/2022, CONSULTATION DATE:  6/16 REFERRING MD:  Cornett- surgery, CHIEF COMPLAINT:  post-op vent   History of Present Illness:  Kaitlyn Bray is a 57 y/o woman with a history of a ventral hernia with recent robotic incisional hernia repair on 6/10 who had been progressing well until she developed fever and drainage of foul-smelling fluid from her lower abdominal incision. She returned to the OR for exploratory ex-lap 6/16, found to have bowel contents throughout her abdomen. She required excision of the mesh and identification of her site of perforation, which was repaired then resected with end-to-end anastamosis. Her abdomen was unable to be closed and despite extensive irrigation, she will require additional washout. She had cultures collected intraoperatively. Zosyn was given in the OR.  She was brought to the ICU intubated, sedated, paralyzed post-op. She will remain intubated until she is able to return to the OR to be closed.   She has a complicated abdominal surgery history with previous hysterectomy, multiple incisional hernias in the past. She has had post-op ileus complicating several previous surgeries.   Pertinent  Medical History  Obesity Diabetes Hypertension  Significant Hospital Events: Including procedures, antibiotic start and stop dates in addition to other pertinent events   6/10 hernia repair with mesh 6/14 Urine output decreased overnight 10  6/16 ex lap resection of small bowel perf. Gross contamination of space. Left open and intubated. Pccm consult   6/18 ex lap washout. Significantly dilated bowel, turbid fluid. Left open  6/19 started TPN 6/20 plan for OR today for washout  6/21 adding fluc. ID consult   Interim History / Subjective:   Intermittently febrile.  Fluconazole added yesterday with yeast in fluid culture.  Returning to the OR today for potential closure and  washout.  Objective   Blood pressure (!) 112/52, pulse 70, temperature (!) 102 F (38.9 C), temperature source Rectal, resp. rate (!) 23, height 5\' 5"  (1.651 m), weight 105 kg, SpO2 99 %. CVP:  [4 mmHg-6 mmHg] 4 mmHg  Vent Mode: PRVC FiO2 (%):  [28 %-30 %] 30 % Set Rate:  [20 bmp] 20 bmp Vt Set:  [460 mL] 460 mL PEEP:  [5 cmH20] 5 cmH20 Plateau Pressure:  [14 cmH20-15 cmH20] 15 cmH20   Intake/Output Summary (Last 24 hours) at 11/04/2022 0746 Last data filed at 11/04/2022 0617 Gross per 24 hour  Intake 2188.66 ml  Output 3775 ml  Net -1586.34 ml    Filed Weights   11/02/22 0700 11/03/22 0500 11/04/22 0500  Weight: 118.4 kg 107.2 kg 105 kg   Examination: General:  Critically ill middle aged F intubated sedated  HEENT: NCAT ETT secure anicteric sclera  Neuro: Sedate. Awakens to voice and follows commands  CV: rr cap refill < 3 sec PULM:  Mechanically ventilated Symmetrical chest expansion  GI:  midline surgical site w wound vac  Extremities: no acute joint deformity  Skin: c/d/w pale   Resolved Hospital Problem list    Hyperkalemia,  Hyponatremia  Assessment & Plan:   Post operative ventilator management: Due to open abdomen primarily. -- PRVC, minimize tidal volumes as able --VAP bundle, stress ulcer prophylaxis -- Not a candidate for SBT while open abdomen -- PAD with fentanyl, midazolam gtt -- Lasix IV on return from OR in effort to optimize respiratory status   Peritonitis: Due to bowel perforation, ongoing purulent material in the abdomen on takeback 6/20.  Intraoperative  cultures are polymicrobial. -- Continue Zosyn -- Fluconazole added 6/21 for yeast in culture -- ID consult   Bowel perforation status post repair: -- Appreciate surgery assistance, abdomen remains open -- Gastric tube to low intermittent wall suction -- TPN    Diabetes: -- SSI   Postoperative pain control: -- Fentanyl gtt for now, unable to use gut so holding home medications   Fever:  Suspect related to postop, also with peritonitis, infection --Ice packs, Tylenol as needed  Best Practice (right click and "Reselect all SmartList Selections" daily)   Diet/type: NPO;  TPN DVT prophylaxis: LMWH GI prophylaxis: PPI Lines: Central line/ PICC RUE Foley:  Yes, and it is still needed Code Status:  full code Last date of multidisciplinary goals of care discussion- pt updated daily  CRITICAL CARE Performed by: Lesia Sago Jamiah Recore   Total critical care time: 32 minutes  Critical care time was exclusive of separately billable procedures and treating other patients. Critical care was necessary to treat or prevent imminent or life-threatening deterioration.  Critical care was time spent personally by me on the following activities: development of treatment plan with patient and/or surrogate as well as nursing, discussions with consultants, evaluation of patient's response to treatment, examination of patient, obtaining history from patient or surrogate, ordering and performing treatments and interventions, ordering and review of laboratory studies, ordering and review of radiographic studies, pulse oximetry and re-evaluation of patient's condition.  Karren Burly, MD [facility] Pulmonary/Critical Care Medicine Amion for contact 11/04/2022, 7:46 AM

## 2022-11-05 DIAGNOSIS — K432 Incisional hernia without obstruction or gangrene: Secondary | ICD-10-CM | POA: Diagnosis not present

## 2022-11-05 LAB — GLUCOSE, CAPILLARY
Glucose-Capillary: 150 mg/dL — ABNORMAL HIGH (ref 70–99)
Glucose-Capillary: 168 mg/dL — ABNORMAL HIGH (ref 70–99)
Glucose-Capillary: 147 mg/dL — ABNORMAL HIGH (ref 70–99)
Glucose-Capillary: 131 mg/dL — ABNORMAL HIGH (ref 70–99)
Glucose-Capillary: 152 mg/dL — ABNORMAL HIGH (ref 70–99)
Glucose-Capillary: 156 mg/dL — ABNORMAL HIGH (ref 70–99)

## 2022-11-05 LAB — CBC
HCT: 36 % (ref 36.0–46.0)
Hemoglobin: 10.3 g/dL — ABNORMAL LOW (ref 12.0–15.0)
MCH: 27.5 pg (ref 26.0–34.0)
MCHC: 28.6 g/dL — ABNORMAL LOW (ref 30.0–36.0)
MCV: 96 fL (ref 80.0–100.0)
Platelets: 365 10*3/uL (ref 150–400)
RBC: 3.75 MIL/uL — ABNORMAL LOW (ref 3.87–5.11)
RDW: 15.1 % (ref 11.5–15.5)
WBC: 23.2 10*3/uL — ABNORMAL HIGH (ref 4.0–10.5)
nRBC: 0.3 % — ABNORMAL HIGH (ref 0.0–0.2)

## 2022-11-05 LAB — CULTURE, BLOOD (ROUTINE X 2)

## 2022-11-05 LAB — COMPREHENSIVE METABOLIC PANEL
ALT: 15 U/L (ref 0–44)
AST: 17 U/L (ref 15–41)
Albumin: 1.7 g/dL — ABNORMAL LOW (ref 3.5–5.0)
Alkaline Phosphatase: 51 U/L (ref 38–126)
Anion gap: 17 — ABNORMAL HIGH (ref 5–15)
BUN: 26 mg/dL — ABNORMAL HIGH (ref 6–20)
CO2: 25 mmol/L (ref 22–32)
Calcium: 7.1 mg/dL — ABNORMAL LOW (ref 8.9–10.3)
Chloride: 101 mmol/L (ref 98–111)
Creatinine, Ser: 0.74 mg/dL (ref 0.44–1.00)
GFR, Estimated: >60 mL/min (ref >60)
Glucose, Bld: 198 mg/dL — ABNORMAL HIGH (ref 70–99)
Potassium: 3.7 mmol/L (ref 3.5–5.1)
Sodium: 143 mmol/L (ref 135–145)
Total Bilirubin: 0.3 mg/dL (ref 0.3–1.2)
Total Protein: 5.8 g/dL — ABNORMAL LOW (ref 6.5–8.1)

## 2022-11-05 LAB — PHOSPHORUS: Phosphorus: 2.6 mg/dL (ref 2.5–4.6)

## 2022-11-05 LAB — MAGNESIUM: Magnesium: 2.3 mg/dL (ref 1.7–2.4)

## 2022-11-05 MED ORDER — POTASSIUM CHLORIDE 10 MEQ/50ML IV SOLN
10.0000 meq | INTRAVENOUS | Status: AC
  Administered 2022-11-05 (×4): 10 meq via INTRAVENOUS
  Filled 2022-11-05 (×4): qty 50

## 2022-11-05 MED ORDER — TRAVASOL 10 % IV SOLN
INTRAVENOUS | Status: AC
  Filled 2022-11-05: qty 864

## 2022-11-05 MED ORDER — FUROSEMIDE 10 MG/ML IJ SOLN
40.0000 mg | Freq: Once | INTRAMUSCULAR | Status: AC
  Administered 2022-11-05: 40 mg via INTRAVENOUS
  Filled 2022-11-05: qty 4

## 2022-11-05 NOTE — Progress Notes (Signed)
NAME:  Kaitlyn Bray, MRN:  [id number], DOB:  01-19-1966, LOS: 13 ADMISSION DATE:  10/23/2022, CONSULTATION DATE:  6/16 REFERRING MD:  Cornett- surgery, CHIEF COMPLAINT:  post-op vent   History of Present Illness:  [name] is a 57 y/o woman with a history of a ventral hernia with recent robotic incisional hernia repair on 6/10 who had been progressing well until she developed fever and drainage of foul-smelling fluid from her lower abdominal incision. She returned to the OR for exploratory ex-lap 6/16, found to have bowel contents throughout her abdomen. She required excision of the mesh and identification of her site of perforation, which was repaired then resected with end-to-end anastamosis. Her abdomen was unable to be closed and despite extensive irrigation, she will require additional washout. She had cultures collected intraoperatively. Zosyn was given in the OR.  She was brought to the ICU intubated, sedated, paralyzed post-op. She will remain intubated until she is able to return to the OR to be closed.   She has a complicated abdominal surgery history with previous hysterectomy, multiple incisional hernias in the past. She has had post-op ileus complicating several previous surgeries.   Pertinent  Medical History  Obesity Diabetes Hypertension  Significant Hospital Events: Including procedures, antibiotic start and stop dates in addition to other pertinent events   6/10 hernia repair with mesh 6/14 Urine output decreased overnight 10  6/16 ex lap resection of small bowel perf. Gross contamination of space. Left open and intubated. Pccm consult   6/18 ex lap washout. Significantly dilated bowel, turbid fluid. Left open  6/19 started TPN 6/20 plan for OR today for washout  6/21 adding fluc. ID consult  6/2 to return the OR, abdomen closed, large wound VAC in place  Interim History / Subjective:   Return to the OR yesterday, abdomen was closed.  Fever curve improving.   Remains on antibiotics.  Attempt to wean sedation, SBT/SAT today.  Objective   Blood pressure 109/69, pulse 75, temperature 98.4 F (36.9 C), temperature source Rectal, resp. rate (!) 22, height 5\' 5"  (1.651 m), weight 107.2 kg, SpO2 100 %. CVP:  [6 mmHg-12 mmHg] 12 mmHg  Vent Mode: PRVC FiO2 (%):  [30 %] 30 % Set Rate:  [20 bmp] 20 bmp Vt Set:  [460 mL] 460 mL PEEP:  [5 cmH20] 5 cmH20 Plateau Pressure:  [15 cmH20-19 cmH20] 15 cmH20   Intake/Output Summary (Last 24 hours) at 11/05/2022 0846 Last data filed at 11/05/2022 0600 Gross per 24 hour  Intake 2385.77 ml  Output 2630 ml  Net -244.23 ml    Filed Weights   11/03/22 0500 11/04/22 0500 11/05/22 0705  Weight: 107.2 kg 105 kg 107.2 kg   Examination: General:  Critically ill middle aged F intubated sedated  HEENT: NCAT ETT secure anicteric sclera  Neuro: Sedate. Awakens to voice and follows commands  CV: rr cap refill < 3 sec PULM:  Mechanically ventilated Symmetrical chest expansion  GI:  midline surgical site w wound vac  Extremities: no acute joint deformity  Skin: c/d/w pale   Resolved Hospital Problem list    Hyperkalemia,  Hyponatremia  Assessment & Plan:   Post operative ventilator management: Due to open abdomen primarily. -- PRVC, minimize tidal volumes as able --VAP bundle, stress ulcer prophylaxis -- PAD with fentanyl, midazolam gtt, weaning sedation, pressure support trials versus SBT and extubation if able -- Lasix IV    Peritonitis: Due to bowel perforation, ongoing purulent material in the abdomen on takeback  6/20.  Intraoperative cultures are polymicrobial. -- Continue Zosyn -- Fluconazole added 6/21 for yeast in culture -- ID consult   Bowel perforation status post repair: -- Appreciate surgery assistance, abdomen close 6/22 -- Gastric tube to low intermittent wall suction -- TPN    Diabetes: -- SSI   Postoperative pain control: -- Fentanyl gtt for now, unable to use gut so holding home  medications   Fever: Suspect related to postop, also with peritonitis, infection --Ice packs, Tylenol as needed  Best Practice (right click and "Reselect all SmartList Selections" daily)   Diet/type: NPO;  TPN DVT prophylaxis: LMWH GI prophylaxis: PPI Lines: Central line/ PICC RUE Foley:  Yes, and it is still needed Code Status:  full code Last date of multidisciplinary goals of care discussion- pt updated daily  CRITICAL CARE Performed by: Lesia Sago Meric Joye   Total critical care time: 31 minutes  Critical care time was exclusive of separately billable procedures and treating other patients. Critical care was necessary to treat or prevent imminent or life-threatening deterioration.  Critical care was time spent personally by me on the following activities: development of treatment plan with patient and/or surrogate as well as nursing, discussions with consultants, evaluation of patient's response to treatment, examination of patient, obtaining history from patient or surrogate, ordering and performing treatments and interventions, ordering and review of laboratory studies, ordering and review of radiographic studies, pulse oximetry and re-evaluation of patient's condition.  Karren Burly, MD [facility] Pulmonary/Critical Care Medicine Amion for contact 11/05/2022, 8:46 AM

## 2022-11-05 NOTE — Progress Notes (Addendum)
Pt has a decrease in urine output. Dr. Judeth Horn notified. Bladder scan showed 480cc. Tried to flush foley but had resistance. Advanced cathter. Urine output increased. Got 700cc of urine

## 2022-11-05 NOTE — Progress Notes (Signed)
PHARMACY - TOTAL PARENTERAL NUTRITION CONSULT NOTE   Indication: Prolonged ileus  Patient Measurements: Height: 5\' 5"  (165.1 cm) Weight: 107.2 kg (236 lb 5.3 oz) IBW/kg (Calculated) : 57 TPN AdjBW (KG): 71.4 Body mass index is 39.33 kg/m.  Assessment:  Pharmacy consulted to start TPN on a 57 yo pt with complicated robotic recurrent incisional hernia repair with mesh, bilateral posterior rectus myofascial release, bilateral transversus abdominis release and removal of previous polypropylene mesh with prolonged lyses of adhesions on 10/23/22. This was complicated by a bowel perforation requiring return to OR on 6/16, 6/18, 6/20, and 6/22.   Glucose / Insulin:  - A1c on 6/3 is 8.5 - Pt on mSSI q4h  - CBGs have ranged 124-235 in past 24 hours ( target <150) - previous 24 hour insulin use: 18 units    Electrolytes:  - Potassium low at 3.7-provider already ordered KCl 10 mEq q1h x 4 this am - Magnesium remains WNL (removed from TPN on 6/20-6/22) - Phosphorus back down to WNL from 6.1 yesterday (removed from TPN yesterday) - Remaining electrolytes WNL,including corrected calcium at 9.26 Renal:  - Scr < 1 - BUN up to 26 Hepatic:  - WNL other than albumin low at 1.7 Intake / Output; MIVF:  - Not on mIVF - Urine output: down from yesterday at 1575, drain: 805 - I/O Net: - 244.2 GI Imaging: - 6/16 DG Abd for OG tube placement GI Surgeries / Procedures:  - 6/10 Robotic incisional hernia repair - 6/16 exploratory laparotomy, repair of small bowel perforation  - 6/18 exploratory laparotomy, wash out of abdomen and wound vac placement  - 6/20 exploratory laparotomy, wound wash out of abdomen and wound vac change  -6/22 absorbable mesh closure of abdomen, application of wound vac   Central access: PICC  TPN start date: 11/01/22  Nutritional Goals: Goal TPN rate is 100 mL/hr (provides 108 g of protein and 2011 kcals per day)  RD Assessment: Estimated Needs Total Energy Estimated  Needs: 1900-2200 kcals Total Protein Estimated Needs: 115-145 grams Total Fluid Estimated Needs: >/= 1.9L  Current Nutrition:  NPO  Plan:  Increase TPN to 80 mL/hr at 1800 Electrolytes in TPN:  Na 50 mEq/L K 70 mEq/L - increase Ca 5 mEq/L  Mg 0 mEq/L - continue to remove Phos 5 mmol/L - increased Cl:Ac 1:1 Add standard MVI and trace elements to TPN Continue Moderate q4h SSI and adjust as needed  Not on maintenance fluids  Monitor TPN labs tomorrow and on Mon/Thurs   Thank you for allowing pharmacy to be a part of this patient's care.  Selinda Eon, PharmD, BCPS Clinical Pharmacist [facility] 11/05/2022 8:22 AM

## 2022-11-05 NOTE — Progress Notes (Signed)
Called [phone] to update husband, no answer and mailbox is full so I couldn't leave a message.

## 2022-11-05 NOTE — Progress Notes (Signed)
1 Day Post-Op   Subjective/Chief Complaint: Fever curve improving    Objective: Vital signs in last 24 hours: Temp:  [98.3 F (36.8 C)-100.9 F (38.3 C)] 98.4 F (36.9 C) (06/23 0740) Pulse Rate:  [60-93] 75 (06/23 0600) Resp:  [20-38] 22 (06/23 0600) BP: (104-123)/(52-75) 109/69 (06/23 0600) SpO2:  [95 %-100 %] 100 % (06/23 0600) FiO2 (%):  [30 %] 30 % (06/23 0400) Weight:  [107.2 kg] 107.2 kg (06/23 0705) Last BM Date : 10/28/22  Intake/Output from previous day: 06/22 0701 - 06/23 0700 In: 2385.8 [I.V.:1687.9; IV Piggyback:697.9] Out: 2630 [Urine:1575; Drains:805; Blood:250] Intake/Output this shift: No intake/output data recorded.  Exam: Intubated and sedated Abdomen soft, VAC in place  Lab Results:  Recent Labs    11/03/22 0246 11/04/22 1221  WBC 19.3* 26.7*  HGB 8.4* 9.4*  HCT 29.2* 32.7*  PLT 353 438*    BMET Recent Labs    11/04/22 0635 11/05/22 0550  NA 143 143  K 3.6 3.7  CL 105 101  CO2 28 25  GLUCOSE 124* 198*  BUN 21* 26*  CREATININE 0.78 0.74  CALCIUM 6.9* 7.1*    PT/INR No results for input(s): "LABPROT", "INR" in the last 72 hours. ABG No results for input(s): "PHART", "HCO3" in the last 72 hours.  Invalid input(s): "PCO2", "PO2"  Studies/Results: No results found.  Anti-infectives: Anti-infectives (From admission, onward)    Start     Dose/Rate Route Frequency Ordered Stop   11/05/22 1215  vancomycin (VANCOREADY) IVPB 1750 mg/350 mL        1,750 mg 175 mL/hr over 120 Minutes Intravenous Every 24 hours 11/04/22 1125     11/04/22 1215  vancomycin (VANCOREADY) IVPB 2000 mg/400 mL        2,000 mg 200 mL/hr over 120 Minutes Intravenous STAT 11/04/22 1125 11/04/22 1402   11/04/22 1000  fluconazole (DIFLUCAN) IVPB 400 mg        400 mg 100 mL/hr over 120 Minutes Intravenous Every 24 hours 11/03/22 0801     11/03/22 0845  fluconazole (DIFLUCAN) IVPB 800 mg        800 mg 100 mL/hr over 240 Minutes Intravenous  Once 11/03/22 0759  11/03/22 1327   10/28/22 1000  azithromycin (ZITHROMAX) tablet 250 mg  Status:  Discontinued       See Hyperspace for full Linked Orders Report.   250 mg Oral Daily 10/27/22 0738 10/27/22 1653   10/27/22 1700  piperacillin-tazobactam (ZOSYN) IVPB 3.375 g        3.375 g 12.5 mL/hr over 240 Minutes Intravenous Every 8 hours 10/27/22 1653     10/27/22 1000  azithromycin (ZITHROMAX) tablet 500 mg       See Hyperspace for full Linked Orders Report.   500 mg Oral Daily 10/27/22 0738 10/27/22 0946   10/23/22 1054  ceFAZolin (ANCEF) 2-4 GM/100ML-% IVPB       Note to Pharmacy: Augustine Radar R: cabinet override      10/23/22 1054 10/23/22 2259   10/23/22 0600  ceFAZolin (ANCEF) IVPB 3g/100 mL premix        3 g 200 mL/hr over 30 Minutes Intravenous On call to O.R. 10/23/22 0540 10/23/22 1213       Assessment/Plan: Kaitlyn Bray underwent difficult robotic recurrent incisional hernia repair with mesh, bilateral posterior rectus myofascial release, bilateral transversus abdominis release and removal of previous polypropylene mesh with prolonged lyses of adhesions on 10/23/22. This was complicated by bowel perforation, possible missed enterotomy during dissection of  small intestine off mesh, requiring return to the operating room with washout, small bowel resection and ABTHERA placement on 10/29/22.  Returned to the OR for wash out 6/18 and 6/20.  Underwent bridging retromuscular Phasix mesh closure of the abdomen 11/04/22.  ID consult for polymicrobial infection - continue antibiotics I plan to change wound vac tomorrow at bedside on morning rounds, will need 4 white sponges as well as normal wound vac equipment Continue NPO till bowel movements.  Appreciate CCM and ID involvement  [name], MD  11/05/2022

## 2022-11-06 ENCOUNTER — Encounter (HOSPITAL_COMMUNITY): Payer: Self-pay | Admitting: Surgery

## 2022-11-06 ENCOUNTER — Inpatient Hospital Stay (HOSPITAL_COMMUNITY): Payer: 59

## 2022-11-06 DIAGNOSIS — K432 Incisional hernia without obstruction or gangrene: Secondary | ICD-10-CM | POA: Diagnosis not present

## 2022-11-06 LAB — TRIGLYCERIDES: Triglycerides: 406 mg/dL — ABNORMAL HIGH (ref <150)

## 2022-11-06 LAB — COMPREHENSIVE METABOLIC PANEL
ALT: 14 U/L (ref 0–44)
AST: 19 U/L (ref 15–41)
Albumin: 1.6 g/dL — ABNORMAL LOW (ref 3.5–5.0)
Alkaline Phosphatase: 53 U/L (ref 38–126)
Anion gap: 13 (ref 5–15)
BUN: 31 mg/dL — ABNORMAL HIGH (ref 6–20)
CO2: 26 mmol/L (ref 22–32)
Calcium: 7.4 mg/dL — ABNORMAL LOW (ref 8.9–10.3)
Chloride: 108 mmol/L (ref 98–111)
Creatinine, Ser: 0.68 mg/dL (ref 0.44–1.00)
GFR, Estimated: >60 mL/min (ref >60)
Glucose, Bld: 175 mg/dL — ABNORMAL HIGH (ref 70–99)
Potassium: 4.2 mmol/L (ref 3.5–5.1)
Sodium: 147 mmol/L — ABNORMAL HIGH (ref 135–145)
Total Bilirubin: 0.3 mg/dL (ref 0.3–1.2)
Total Protein: 5.3 g/dL — ABNORMAL LOW (ref 6.5–8.1)

## 2022-11-06 LAB — CBC WITH DIFFERENTIAL/PLATELET
Abs Immature Granulocytes: 1.7 10*3/uL — ABNORMAL HIGH (ref 0.00–0.07)
Basophils Absolute: 0.1 10*3/uL (ref 0.0–0.1)
Basophils Relative: 1 %
Eosinophils Absolute: 0.5 10*3/uL (ref 0.0–0.5)
Eosinophils Relative: 2 %
HCT: 28.7 % — ABNORMAL LOW (ref 36.0–46.0)
Hemoglobin: 8.1 g/dL — ABNORMAL LOW (ref 12.0–15.0)
Immature Granulocytes: 8 %
Lymphocytes Relative: 8 %
Lymphs Abs: 1.7 10*3/uL (ref 0.7–4.0)
MCH: 27.6 pg (ref 26.0–34.0)
MCHC: 28.2 g/dL — ABNORMAL LOW (ref 30.0–36.0)
MCV: 97.6 fL (ref 80.0–100.0)
Monocytes Absolute: 1.1 10*3/uL — ABNORMAL HIGH (ref 0.1–1.0)
Monocytes Relative: 5 %
Neutro Abs: 16.3 10*3/uL — ABNORMAL HIGH (ref 1.7–7.7)
Neutrophils Relative %: 76 %
Platelets: 436 10*3/uL — ABNORMAL HIGH (ref 150–400)
RBC: 2.94 MIL/uL — ABNORMAL LOW (ref 3.87–5.11)
RDW: 15.3 % (ref 11.5–15.5)
WBC: 21.4 10*3/uL — ABNORMAL HIGH (ref 4.0–10.5)
nRBC: 0.3 % — ABNORMAL HIGH (ref 0.0–0.2)

## 2022-11-06 LAB — GLUCOSE, CAPILLARY
Glucose-Capillary: 144 mg/dL — ABNORMAL HIGH (ref 70–99)
Glucose-Capillary: 149 mg/dL — ABNORMAL HIGH (ref 70–99)
Glucose-Capillary: 145 mg/dL — ABNORMAL HIGH (ref 70–99)
Glucose-Capillary: 137 mg/dL — ABNORMAL HIGH (ref 70–99)
Glucose-Capillary: 145 mg/dL — ABNORMAL HIGH (ref 70–99)

## 2022-11-06 LAB — MAGNESIUM: Magnesium: 2.2 mg/dL (ref 1.7–2.4)

## 2022-11-06 LAB — PHOSPHORUS: Phosphorus: 2 mg/dL — ABNORMAL LOW (ref 2.5–4.6)

## 2022-11-06 MED ORDER — DEXMEDETOMIDINE HCL IN NACL 200 MCG/50ML IV SOLN
0.0000 ug/kg/h | INTRAVENOUS | Status: DC
  Administered 2022-11-06: 0.4 ug/kg/h via INTRAVENOUS
  Filled 2022-11-06: qty 50

## 2022-11-06 MED ORDER — MIDAZOLAM HCL 2 MG/2ML IJ SOLN
1.0000 mg | INTRAMUSCULAR | Status: DC | PRN
  Administered 2022-11-07 – 2022-11-08 (×3): 1 mg via INTRAVENOUS
  Filled 2022-11-06 (×3): qty 2

## 2022-11-06 MED ORDER — SODIUM PHOSPHATES 45 MMOLE/15ML IV SOLN
30.0000 mmol | Freq: Once | INTRAVENOUS | Status: AC
  Administered 2022-11-06: 30 mmol via INTRAVENOUS
  Filled 2022-11-06: qty 10

## 2022-11-06 MED ORDER — TRAVASOL 10 % IV SOLN
INTRAVENOUS | Status: AC
  Filled 2022-11-06: qty 1305.6

## 2022-11-06 NOTE — Progress Notes (Addendum)
NAME:  Kaitlyn Bray, MRN:  [id number], DOB:  Feb 20, 1966, LOS: 14 ADMISSION DATE:  10/23/2022, CONSULTATION DATE:  6/16 REFERRING MD:  Cornett- surgery, CHIEF COMPLAINT:  post-op vent   History of Present Illness:  Kaitlyn Bray is a 57 y/o woman with a history of a ventral hernia with recent robotic incisional hernia repair on 6/10 who had been progressing well until she developed fever and drainage of foul-smelling fluid from her lower abdominal incision. She returned to the OR for exploratory ex-lap 6/16, found to have bowel contents throughout her abdomen. She required excision of the mesh and identification of her site of perforation, which was repaired then resected with end-to-end anastamosis. Her abdomen was unable to be closed and despite extensive irrigation, she will require additional washout. She had cultures collected intraoperatively. Zosyn was given in the OR.  She was brought to the ICU intubated, sedated, paralyzed post-op. She will remain intubated until she is able to return to the OR to be closed.   She has a complicated abdominal surgery history with previous hysterectomy, multiple incisional hernias in the past. She has had post-op ileus complicating several previous surgeries.   Pertinent  Medical History  Obesity Diabetes Hypertension  Significant Hospital Events: Including procedures, antibiotic start and stop dates in addition to other pertinent events   6/10 hernia repair with mesh 6/14 Urine output decreased overnight 10  6/16 ex lap resection of small bowel perf. Gross contamination of space. Left open and intubated. Pccm consult   6/18 ex lap washout. Significantly dilated bowel, turbid fluid. Left open  6/19 started TPN 6/20 plan for OR today for washout  6/21 adding fluc. ID consult  6/22 to return the OR, abdomen closed, large wound VAC in place 6/24 continued Vent weaning, continued ABX, wean sedation as able. CXR repeat shows mild R atelectasis, Increasing  PEEP to 8 and attempting to wean down/shut off versed gtt in preparation for extubation when able  Interim History / Subjective:   Surgical service pending wound vac change today, pt follows some simple commands, remains with fevers up to 100.8 overnight. Repeat CXR pending  Objective   Blood pressure 108/60, pulse 67, temperature 99 F (37.2 C), temperature source Rectal, resp. rate (!) 23, height 5\' 5"  (1.651 m), weight 107.2 kg, SpO2 100 %. CVP:  [10 mmHg-15 mmHg] 13 mmHg  Vent Mode: PRVC FiO2 (%):  [30 %] 30 % Set Rate:  [20 bmp] 20 bmp Vt Set:  [460 mL] 460 mL PEEP:  [5 cmH20] 5 cmH20 Pressure Support:  [8 cmH20-12 cmH20] 12 cmH20 Plateau Pressure:  [16 cmH20-17 cmH20] 16 cmH20   Intake/Output Summary (Last 24 hours) at 11/06/2022 0745 Last data filed at 11/06/2022 0600 Gross per 24 hour  Intake 3133.77 ml  Output 2955 ml  Net 178.77 ml   Filed Weights   11/03/22 0500 11/04/22 0500 11/05/22 0705  Weight: 107.2 kg 105 kg 107.2 kg   Examination: General:  Critically ill female pt intubated and sedated. HEENT: MM pink/moist Neuro: Intubated on Ventilator and sedation, follows simple commands, PERRLA  CV: s1s2 RRR, no m/r/g PULM:  Mechanically vented, clear BL without any rhonchi or wheezing. Synchronous with vent with equal chest rise. Clear secretions GI: soft, bsx4 active  Extremities: warm/dry, no edema BL Skin: no rashes or lesions  Pt notably has uptrending NA at 147 and persistent leukocytosis which remains improved from prior draws at 23.2.   Resolved Hospital Problem list    Hyperkalemia,  Hyponatremia  Assessment & Plan:   Post operative ventilator management: Due to open abdomen primarily. -- PRVC, minimize tidal volumes as able --VAP bundle, stress ulcer prophylaxis -- PAD with fentanyl, midazolam gtt, weaning sedation, pressure support trials versus SBT and extubation if able -- Lasix IV     Peritonitis: Due to bowel perforation, ongoing purulent  material in the abdomen on takeback 6/20.  Intraoperative cultures are polymicrobial. Still with leukocytosis and fevers, continue to monitor and trend  -- Continue Zosyn -- Fluconazole added 6/21 for yeast in culture -- ID consult, appreciate recommendations   Bowel perforation status post repair: -- Appreciate surgery assistance, abdomen close 6/22- surgical team to change wound vac today 6/24 -- Gastric tube to low intermittent wall suction -- TPN    Diabetes: -- SSI   Hypophosphatemia: --Serum Phosphorous 2.0, replaced with sodium phos IVF. Continue to monitor with AM labs   Postoperative pain control: -- Fentanyl gtt for now- wean as tolerated, unable to use gut so holding home medications   Fever: Suspect related to postop, also with peritonitis, infection --continue cooling blanket, Tylenol as needed  Best Practice (right click and "Reselect all SmartList Selections" daily)   Diet/type: NPO;  TPN DVT prophylaxis: LMWH GI prophylaxis: PPI Lines: Central line/ PICC RUE Foley:  Yes, and it is still needed Code Status:  full code Last date of multidisciplinary goals of care discussion- pt updated daily  CRITICAL CARE Performed by: Janeece Riggers   Total critical care time: 31 minutes  Critical care time was exclusive of separately billable procedures and treating other patients. Critical care was necessary to treat or prevent imminent or life-threatening deterioration.  Critical care was time spent personally by me on the following activities: development of treatment plan with patient and/or surrogate as well as nursing, discussions with consultants, evaluation of patient's response to treatment, examination of patient, obtaining history from patient or surrogate, ordering and performing treatments and interventions, ordering and review of laboratory studies, ordering and review of radiographic studies, pulse oximetry and re-evaluation of patient's condition.  Janeece Riggers, NP [facility] Pulmonary/Critical Care Medicine Amion for contact 11/06/2022, 7:45 AM

## 2022-11-06 NOTE — Consult Note (Addendum)
WOC consulted to change therapy type on NPWT device from ABTHERA to traditional NPWT setting. Dr. Dossie Der to change dressing Wednesday 6/26, WOC nursing to change dressing Friday 6/28. Dressing supplies ordered for patient's room (6) white foam dressings and (3) large black foam dressing   WOC nursing team aware of need for dressing change and coordination with surgeon.    Kaitlyn Bray [facility] MSN, RN,CWOCN, CNS, CWON-AP [phone])

## 2022-11-06 NOTE — Progress Notes (Addendum)
PHARMACY - TOTAL PARENTERAL NUTRITION CONSULT NOTE   Indication: Prolonged ileus  Patient Measurements: Height: 5\' 5"  (165.1 cm) Weight: 107.2 kg (236 lb 5.3 oz) IBW/kg (Calculated) : 57 TPN AdjBW (KG): 71.4 Body mass index is 39.33 kg/m.  Assessment:  Pharmacy consulted to start TPN on a 57 yo pt with complicated robotic recurrent incisional hernia repair with mesh, bilateral posterior rectus myofascial release, bilateral transversus abdominis release and removal of previous polypropylene mesh with prolonged lyses of adhesions on 10/23/22. This was complicated by a bowel perforation requiring return to OR on 6/16, 6/18, 6/20, and 6/22.   Glucose / Insulin:  -A1c on 6/3 is 8.5 -Pt on mSSI q4h  -CBGs have ranged 131-168 in past 24 hours (target <150) -Previous 24 hour insulin use: 17 units  Electrolytes: Na 147, phos 2, corrected Ca 9.32 Renal: SCr stable, BUN trending up Hepatic:  -WNL, albumin remains low -TG 406 Intake / Output; MIVF:  -UOP ~ 2 L -Drains 635 mL GI Imaging: NA GI Surgeries / Procedures:  - 6/10 Robotic incisional hernia repair - 6/16 exploratory laparotomy, repair of small bowel perforation  - 6/18 exploratory laparotomy, wash out of abdomen and wound vac placement  - 6/20 exploratory laparotomy, wound wash out of abdomen and wound vac change  - 6/22 absorbable mesh closure of abdomen, application of wound vac   Central access: PICC  TPN start date: 11/01/22  Nutritional Goals: Goal TPN rate is 100 mL/hr  RD Assessment: Estimated Needs Total Energy Estimated Needs: 1900-2200 kcals Total Protein Estimated Needs: 115-145 grams Total Fluid Estimated Needs: >/= 1.9L  Current Nutrition:  NPO  Plan:  -Na phos 30 mmol IV -Continue TPN at 80 mL/hr -TG 406 - remove lipids from TPN for now. Increase protein to 130 g and dextrose to 307 g - total kcal 1566 which is approximately 80% of needs. Will consider further increase in dextrose tomorrow pending  glucose trend in order to better meet needs. -Recheck TG on Thursday -Electrolytes in TPN: Na 40 mEq/L, K 60 mEq/L, Ca 5 mEq/L, Mg 2 mEq/L, Phos 10 mmol/L. Cl:Ac 1:1 -Add standard MVI and trace elements to TPN -Continue Moderate q4h SSI and adjust as needed  -Monitor TPN labs on Mon/Thurs at minimum, recheck tomorrow morning given ongoing adjustments   Thank you for allowing pharmacy to be a part of this patient's care.  Pricilla Riffle, PharmD, BCPS Clinical Pharmacist 11/06/2022 1:14 PM

## 2022-11-06 NOTE — Progress Notes (Signed)
2 Days Post-Op   Subjective/Chief Complaint: Working towards extubation    Objective: Vital signs in last 24 hours: Temp:  [98.6 F (37 C)-100.8 F (38.2 C)] 99 F (37.2 C) (06/23 2000) Pulse Rate:  [64-98] 67 (06/24 0700) Resp:  [15-27] 23 (06/24 0700) BP: (88-134)/(48-69) 108/60 (06/24 0700) SpO2:  [96 %-100 %] 100 % (06/24 0700) FiO2 (%):  [30 %] 30 % (06/24 0310) Last BM Date : 10/28/22  Intake/Output from previous day: 06/23 0701 - 06/24 0700 In: 3133.8 [I.V.:2215.2; IV Piggyback:918.6] Out: 2955 [Urine:1880; Emesis/NG output:530; Drains:545] Intake/Output this shift: No intake/output data recorded.  Exam: Intubated and sedated Abdomen soft, Wound vac changed at bedside  Lab Results:  Recent Labs    11/04/22 1221 11/05/22 0939  WBC 26.7* 23.2*  HGB 9.4* 10.3*  HCT 32.7* 36.0  PLT 438* 365    BMET Recent Labs    11/05/22 0550 11/06/22 0630  NA 143 147*  K 3.7 4.2  CL 101 108  CO2 25 26  GLUCOSE 198* 175*  BUN 26* 31*  CREATININE 0.74 0.68  CALCIUM 7.1* 7.4*    PT/INR No results for input(s): "LABPROT", "INR" in the last 72 hours. ABG No results for input(s): "PHART", "HCO3" in the last 72 hours.  Invalid input(s): "PCO2", "PO2"  Studies/Results: DG Chest 1 View  Result Date: 11/06/2022 CLINICAL DATA:  Difficulty weaning from ventilator. EXAM: CHEST  1 VIEW COMPARISON:  Chest radiograph 10/29/2022. FINDINGS: The endotracheal tube tip is proximally 2.4 cm from the carina. The right upper extremity PICC tip is in the region of the cavoatrial junction. The enteric catheter tip is not included within the field of view. The cardiomediastinal silhouette is stable allowing for rightward patient rotation. There is probable mild atelectasis in the right lung base with slight asymmetric elevation of the right hemidiaphragm. Otherwise, there is no focal airspace opacity. There is no evidence of overt pulmonary edema. There is no significant pleural effusion.  There is no pneumothorax There is no acute osseous abnormality. IMPRESSION: 1. Endotracheal tube tip and right upper extremity PICC tip in appropriate position. 2. Probable mild right basilar atelectasis. Electronically Signed   By: Lesia Hausen M.D.   On: 11/06/2022 08:07    Anti-infectives: Anti-infectives (From admission, onward)    Start     Dose/Rate Route Frequency Ordered Stop   11/05/22 1215  vancomycin (VANCOREADY) IVPB 1750 mg/350 mL        1,750 mg 175 mL/hr over 120 Minutes Intravenous Every 24 hours 11/04/22 1125     11/04/22 1215  vancomycin (VANCOREADY) IVPB 2000 mg/400 mL        2,000 mg 200 mL/hr over 120 Minutes Intravenous STAT 11/04/22 1125 11/04/22 1402   11/04/22 1000  fluconazole (DIFLUCAN) IVPB 400 mg        400 mg 100 mL/hr over 120 Minutes Intravenous Every 24 hours 11/03/22 0801     11/03/22 0845  fluconazole (DIFLUCAN) IVPB 800 mg        800 mg 100 mL/hr over 240 Minutes Intravenous  Once 11/03/22 0759 11/03/22 1327   10/28/22 1000  azithromycin (ZITHROMAX) tablet 250 mg  Status:  Discontinued       See Hyperspace for full Linked Orders Report.   250 mg Oral Daily 10/27/22 0738 10/27/22 1653   10/27/22 1700  piperacillin-tazobactam (ZOSYN) IVPB 3.375 g        3.375 g 12.5 mL/hr over 240 Minutes Intravenous Every 8 hours 10/27/22 1653  10/27/22 1000  azithromycin (ZITHROMAX) tablet 500 mg       See Hyperspace for full Linked Orders Report.   500 mg Oral Daily 10/27/22 0738 10/27/22 0946   10/23/22 1054  ceFAZolin (ANCEF) 2-4 GM/100ML-% IVPB       Note to Pharmacy: Augustine Radar R: cabinet override      10/23/22 1054 10/23/22 2259   10/23/22 0600  ceFAZolin (ANCEF) IVPB 3g/100 mL premix        3 g 200 mL/hr over 30 Minutes Intravenous On call to O.R. 10/23/22 0540 10/23/22 1213       Assessment/Plan: Ms. Sparr underwent difficult robotic recurrent incisional hernia repair with mesh, bilateral posterior rectus myofascial release, bilateral  transversus abdominis release and removal of previous polypropylene mesh with prolonged lyses of adhesions on 10/23/22. This was complicated by bowel perforation, possible missed enterotomy during dissection of small intestine off mesh, requiring return to the operating room with washout, small bowel resection and ABTHERA placement on 10/29/22.  Returned to the OR for wash out 6/18 and 6/20.  Underwent bridging retromuscular Phasix mesh closure of the abdomen 11/04/22.  ID consult for polymicrobial infection - continue antibiotics I changed wound vac at bedside.  I can't get this vac to change off of ABTHERA setting to normal vac setting.  Okay to be on 125 mmhg of suction.  Will consult wound ostomy nurse to check on vac today and will eventually ask them to take over wound vac changes.  Wound is 22cm tall by 15 cm wide and about 2-4 cm deep.  I placed three pieces of white sponge over the mesh then a black sponge.  Holding suction without leak. Continue NPO till bowel movements. Still high risk for fistula formation Critical care team working towards extubation.  Appreciate CCM and ID involvement  [name], MD  11/06/2022

## 2022-11-06 NOTE — Progress Notes (Signed)
eLink Physician-Brief Progress Note Patient Name: Kaitlyn Bray DOB: 1965/08/11 MRN: [id number]   Date of Service  11/06/2022  HPI/Events of Note  Received request to change versed PRN order as patient is no longer on a versed gtt.   Pt remains intubated.  eICU Interventions  Versed IV 1mg  q1hr PRN ordered.     Intervention Category Minor Interventions: Routine modifications to care plan (e.g. PRN medications for pain, fever)  Larinda Buttery 11/06/2022, 8:16 PM

## 2022-11-06 NOTE — Progress Notes (Signed)
[facility] Infectious Diseases Follow Up Note  Patient Identification: Patient Name: Kaitlyn Bray MRN: [id number] Admit Date: 10/23/2022  5:34 AM Age: 57 y.o.Today's Date: 11/06/2022  Reason for Visit: Complicated intra-abdominal infection, feculent peritonitis  Principal Problem:   Recurrent ventral hernia Active Problems:   Peritonitis (HCC)   Endotracheally intubated   Small bowel perforation (HCC)  Antibiotics:  Pip-tazo 6/14-c Azithromycin 6/14 Vancomycin 6/22-c   Lines/Hardware: PICC right arm  Interval Events: Tmax 100.8, leukocytosis is downtrending S/p closure of abdomen 6/22 6/21 blood cx NGTD  Assessment 57 YO female with Type 2 DM and multiple abdominal surgeries admitted with    # Post surgical complicated intraabdominal infection/Feculent peritonitis  - most recently had robotic incisional hernia repair with mesh ( vicryl mesh patch of posterior layer of repair) , removal of previous intraperitoneal mesh and lysis of adhesions, bilateral transversus abdominis and bilateral posterior rectus myofascial release on 6/10 who had been progressing well until started developing fever and drainage of foul-smelling fluid from her lower abdominal incision site - underwent OR for exploratory ex lap with resection of small bowel perforation and primary anastomosis and excision of mesh with placement of ABThera wound VAC and open abdomen to complete loss of domain and peritonitis with significant gross contamination of the peritoneal and subcutaneous space( faeculent peritonitis) on 6/16, found to have bowel contents throughout her abdomen. Cultures polymicrobial ( group G streptococci, actinomyecs, bacteroides ovatus beta lactamase positive and candida albicans).  Her abdomen was unable to be closed - underwent repeat OR on 6/18 for ex lap, washout of abdomen and ABThera wound VAC placement then  - 6/20 lap with washout of  abdomen and ABThera wound VAC placement  - 6/22 retromuscular absorbable mesh bridging closure of abdomen and application of wound VAC. D/w Dr Dossie Der, all prior mesh was completely remove and absorbable mesh placed which may take 9 months to absorb  # Fevers/leukocytosis  - fevers and WBC count downtrending, 6/21 blood cx NG in 3 days. PICC does not appear to be infected. Likely all related to burden of abdominal infection and post op status   Recommendations DC Vancomycin 6/25 if blood cx stay negative  Continue zosyn and fluconazole  Monitor CBC and CMP on abtx  Post op care per surgery  Vent management per PCCM Will follow intermittently this week  Rest of the management as per the primary team. Thank you for the consult. Please page with pertinent questions or concerns.  ______________________________________________________________________ Subjective patient seen and examined at the bedside. Intubated   Vitals BP (!) 118/57   Pulse 70   Temp 98.8 F (37.1 C) (Rectal)   Resp 16   Ht 5\' 5"  (1.651 m)   Wt 107.2 kg   SpO2 100%   BMI 39.33 kg/m     Physical Exam Constitutional: Critically ill morbidly obese female lying in the bed    Comments: Opens eyes to verbal stimuli  Cardiovascular:     Rate and Rhythm: Normal rate and regular rhythm.     Heart sounds:   Pulmonary:     Effort: Pulmonary effort is normal on vent     Comments: Coarse breath sounds bilaterally  Abdominal:     Palpations: Abdomen is soft.     Tenderness: Midline wound VAC no surrounding cellulitis or leak, LUQ and RUQ drain   Musculoskeletal:        General: No swelling or tenderness in peripheral joints   Skin:    Comments: no rashes  Neurological:     General: opens eyes to verbal stimuli   Pertinent Microbiology Results for orders placed or performed during the hospital encounter of 10/23/22  MRSA Next Gen by PCR, Nasal     Status: None   Collection Time: 10/29/22  4:49 PM    Specimen: Nasal Mucosa; Nasal Swab  Result Value Ref Range Status   MRSA by PCR Next Gen NOT DETECTED NOT DETECTED Final    Comment: (NOTE) The GeneXpert MRSA Assay (FDA approved for NASAL specimens only), is one component of a comprehensive MRSA colonization surveillance program. It is not intended to diagnose MRSA infection nor to guide or monitor treatment for MRSA infections. Test performance is not FDA approved in patients less than 61 years old. Performed at [facility], [address], [place], [place] [address]   Aerobic/Anaerobic Culture w Gram Stain (surgical/deep wound)     Status: None   Collection Time: 10/29/22  6:33 PM   Specimen: Path fluid; Body Fluid  Result Value Ref Range Status   Specimen Description FLUID ABDOMEN  Final   Special Requests SWAB ID A  Final   Gram Stain   Final    NO WBC SEEN FEW GRAM NEGATIVE RODS RARE GRAM POSITIVE RODS RARE GRAM POSITIVE COCCI Performed at [facility], [address] [place], [place] [address]    Culture   Final    MODERATE STREPTOCOCCUS GROUP G Beta hemolytic streptococci are predictably susceptible to penicillin and other beta lactams. Susceptibility testing not routinely performed. MODERATE ACTINOMYCES SPECIES Standardized susceptibility testing for this organism is not available. ABUNDANT BACTEROIDES OVATUS BETA LACTAMASE POSITIVE MODERATE CANDIDA ALBICANS    Report Status 11/02/2022 FINAL  Final  Culture, blood (Routine X 2) w Reflex to ID Panel     Status: None (Preliminary result)   Collection Time: 11/03/22 10:18 AM   Specimen: BLOOD LEFT ARM  Result Value Ref Range Status   Specimen Description   Final    BLOOD LEFT ARM Performed at [facility], [address], [place], [place] [address]    Special Requests   Final    BOTTLES DRAWN AEROBIC ONLY Blood Culture adequate volume Performed at [facility], [address] [place], [place]  [address]    Culture   Final    NO GROWTH 3 DAYS Performed at [facility], [address], [place], [place] [address]    Report Status PENDING  Incomplete  Culture, blood (Routine X 2) w Reflex to ID Panel     Status: None (Preliminary result)   Collection Time: 11/03/22 10:20 AM   Specimen: BLOOD LEFT HAND  Result Value Ref Range Status   Specimen Description   Final    BLOOD LEFT HAND Performed at [facility], [address], [place], [place] [address]    Special Requests   Final    BOTTLES DRAWN AEROBIC ONLY Blood Culture results may not be optimal due to an inadequate volume of blood received in culture bottles Performed at [facility], [address], [place], [place] [address]    Culture   Final    NO GROWTH 3 DAYS Performed at [facility], [address], [place], [place] [address]    Report Status PENDING  Incomplete    Pertinent Lab.    Latest Ref Rng & Units 11/06/2022    8:15 AM 11/05/2022    9:39 AM 11/04/2022   12:21  PM  CBC  WBC 4.0 - 10.5 K/uL 21.4  23.2  26.7   Hemoglobin 12.0 - 15.0 g/dL 8.1  29.5  9.4   Hematocrit 36.0 - 46.0 % 28.7  36.0  32.7   Platelets 150 - 400 K/uL 436  365  438       Latest Ref Rng & Units 11/06/2022    6:30 AM 11/05/2022    5:50 AM 11/04/2022    6:35 AM  CMP  Glucose 70 - 99 mg/dL 188  416  606   BUN 6 - 20 mg/dL 31  26  21    Creatinine 0.44 - 1.00 mg/dL 3.01  6.01  0.93   Sodium 135 - 145 mmol/L 147  143  143   Potassium 3.5 - 5.1 mmol/L 4.2  3.7  3.6   Chloride 98 - 111 mmol/L 108  101  105   CO2 22 - 32 mmol/L 26  25  28    Calcium 8.9 - 10.3 mg/dL 7.4  7.1  6.9   Total Protein 6.5 - 8.1 g/dL 5.3  5.8    Total Bilirubin 0.3 - 1.2 mg/dL 0.3  0.3    Alkaline Phos 38 - 126 U/L 53  51    AST 15 - 41 U/L 19  17    ALT 0 - 44 U/L 14  15       Pertinent Imaging today Plain films and CT images have been personally visualized and interpreted; radiology reports have been  reviewed. Decision making incorporated into the Impression /   DG Chest 1 View  Result Date: 11/06/2022 CLINICAL DATA:  Difficulty weaning from ventilator. EXAM: CHEST  1 VIEW COMPARISON:  Chest radiograph 10/29/2022. FINDINGS: The endotracheal tube tip is proximally 2.4 cm from the carina. The right upper extremity PICC tip is in the region of the cavoatrial junction. The enteric catheter tip is not included within the field of view. The cardiomediastinal silhouette is stable allowing for rightward patient rotation. There is probable mild atelectasis in the right lung base with slight asymmetric elevation of the right hemidiaphragm. Otherwise, there is no focal airspace opacity. There is no evidence of overt pulmonary edema. There is no significant pleural effusion. There is no pneumothorax There is no acute osseous abnormality. IMPRESSION: 1. Endotracheal tube tip and right upper extremity PICC tip in appropriate position. 2. Probable mild right basilar atelectasis. Electronically Signed   By: Lesia Hausen M.D.   On: 11/06/2022 08:07   DG Abd 1 View  Result Date: 10/29/2022 CLINICAL DATA:  OG tube EXAM: ABDOMEN - 1 VIEW COMPARISON:  None Available. FINDINGS: Orogastric tube tip is seen in the proximal stomach with sidehole at the level of the gastroesophageal junction. No dilated bowel loops are seen. There is left body wall soft tissue air. IMPRESSION: 1. Orogastric tube tip is seen in the proximal stomach with sidehole at the level of the gastroesophageal junction. Consider advancing tube 5 cm. 2. Left body wall soft tissue air of uncertain etiology. Please correlate clinically. Electronically Signed   By: Darliss Cheney M.D.   On: 10/29/2022 20:29   DG Chest Port 1 View  Result Date: 10/29/2022 CLINICAL DATA:  Intubated EXAM: PORTABLE CHEST 1 VIEW COMPARISON:  Chest x-ray 10/27/2022 FINDINGS: The endotracheal tube is 1.9 cm above the carina. Enteric tube extends below the diaphragm. The  cardiomediastinal silhouette is within normal limits for projection. There is left basilar atelectasis. The lungs are otherwise clear. There is no pleural effusion or  pneumothorax. No acute fractures are seen. IMPRESSION: 1. Endotracheal tube 1.9 cm above the carina. 2. Left basilar atelectasis. Electronically Signed   By: Darliss Cheney M.D.   On: 10/29/2022 20:26   [place] EKG SITE RITE  Result Date: 10/29/2022 If Site Rite image not attached, placement could not be confirmed due to current cardiac rhythm.  DG Chest Port 1 View  Result Date: 10/27/2022 CLINICAL DATA:  Fever EXAM: PORTABLE CHEST 1 VIEW COMPARISON:  06/12/2021 FINDINGS: Transverse diameter of heart is increased. There are no signs of pulmonary edema or focal pulmonary consolidation. There is no significant pleural effusion or pneumothorax. IMPRESSION: Cardiomegaly. There are no signs of pulmonary edema or focal pulmonary consolidation. Electronically Signed   By: Ernie Avena M.D.   On: 10/27/2022 16:18    I have personally spent 51  minutes involved in face-to-face and non-face-to-face activities for this patient on the day of the visit. Professional time spent includes the following activities: Preparing to see the patient (review of tests), Obtaining and/or reviewing separately obtained history (admission/discharge record), Performing a medically appropriate examination and/or evaluation , Ordering medications/tests/procedures, referring and communicating with other health care professionals, Documenting clinical information in the EMR, Independently interpreting results (not separately reported), Communicating results to the patient/family/caregiver, Counseling and educating the patient/family/caregiver and Care coordination (not separately reported).   Plan d/w requesting provider as well as ID pharm D  Note: This document was prepared using dragon voice recognition software and may include unintentional dictation errors.    Electronically signed by:   Odette Fraction, MD Infectious Disease Physician [facility] for Infectious Disease Pager: [phone]

## 2022-11-07 DIAGNOSIS — K432 Incisional hernia without obstruction or gangrene: Secondary | ICD-10-CM | POA: Diagnosis not present

## 2022-11-07 LAB — GLUCOSE, CAPILLARY
Glucose-Capillary: 152 mg/dL — ABNORMAL HIGH (ref 70–99)
Glucose-Capillary: 162 mg/dL — ABNORMAL HIGH (ref 70–99)

## 2022-11-07 LAB — BASIC METABOLIC PANEL
Anion gap: 10 (ref 5–15)
BUN: 29 mg/dL — ABNORMAL HIGH (ref 6–20)
CO2: 25 mmol/L (ref 22–32)
Calcium: 7.7 mg/dL — ABNORMAL LOW (ref 8.9–10.3)
Chloride: 113 mmol/L — ABNORMAL HIGH (ref 98–111)
Creatinine, Ser: 0.7 mg/dL (ref 0.44–1.00)
GFR, Estimated: >60 mL/min (ref >60)
Glucose, Bld: 162 mg/dL — ABNORMAL HIGH (ref 70–99)
Potassium: 4.2 mmol/L (ref 3.5–5.1)
Sodium: 148 mmol/L — ABNORMAL HIGH (ref 135–145)

## 2022-11-07 LAB — CULTURE, BLOOD (ROUTINE X 2): Special Requests: ADEQUATE

## 2022-11-07 LAB — CBC
HCT: 28 % — ABNORMAL LOW (ref 36.0–46.0)
Hemoglobin: 8 g/dL — ABNORMAL LOW (ref 12.0–15.0)
MCH: 27.8 pg (ref 26.0–34.0)
MCHC: 28.6 g/dL — ABNORMAL LOW (ref 30.0–36.0)
MCV: 97.2 fL (ref 80.0–100.0)
Platelets: 446 10*3/uL — ABNORMAL HIGH (ref 150–400)
RBC: 2.88 MIL/uL — ABNORMAL LOW (ref 3.87–5.11)
RDW: 15.4 % (ref 11.5–15.5)
WBC: 20.4 10*3/uL — ABNORMAL HIGH (ref 4.0–10.5)
nRBC: 0.3 % — ABNORMAL HIGH (ref 0.0–0.2)

## 2022-11-07 LAB — PHOSPHORUS: Phosphorus: 2.5 mg/dL (ref 2.5–4.6)

## 2022-11-07 LAB — MAGNESIUM: Magnesium: 2.5 mg/dL — ABNORMAL HIGH (ref 1.7–2.4)

## 2022-11-07 MED ORDER — DEXTROSE 5 % IV SOLN: INTRAVENOUS | Status: AC

## 2022-11-07 MED ORDER — ACETAMINOPHEN 650 MG RE SUPP: 650.0000 mg | Freq: Four times a day (QID) | RECTAL | Status: DC | PRN

## 2022-11-07 MED ORDER — ACETAMINOPHEN 10 MG/ML IV SOLN
1000.0000 mg | Freq: Four times a day (QID) | INTRAVENOUS | Status: AC
  Administered 2022-11-07 – 2022-11-08 (×4): 1000 mg via INTRAVENOUS
  Filled 2022-11-07 (×4): qty 100

## 2022-11-07 MED ORDER — ALBUTEROL SULFATE (2.5 MG/3ML) 0.083% IN NEBU: 2.5000 mg | INHALATION_SOLUTION | RESPIRATORY_TRACT | Status: DC | PRN

## 2022-11-07 MED ORDER — TRAVASOL 10 % IV SOLN
INTRAVENOUS | Status: AC
  Filled 2022-11-07: qty 1305.6

## 2022-11-07 MED ORDER — NYSTATIN 100000 UNIT/GM EX POWD
Freq: Two times a day (BID) | CUTANEOUS | Status: DC
  Filled 2022-11-07 (×2): qty 15

## 2022-11-07 MED ORDER — PHENYLEPHRINE HCL-NACL 20-0.9 MG/250ML-% IV SOLN
INTRAVENOUS | Status: AC
  Filled 2022-11-07: qty 250

## 2022-11-07 NOTE — Progress Notes (Addendum)
NAME:  Kaitlyn Bray, MRN:  [id number], DOB:  06-Feb-1966, LOS: 15 ADMISSION DATE:  10/23/2022, CONSULTATION DATE:  6/16 REFERRING MD:  Cornett- surgery, CHIEF COMPLAINT:  post-op vent   History of Present Illness:  Kaitlyn Bray is a 57 y/o woman with a history of a ventral hernia with recent robotic incisional hernia repair on 6/10 who had been progressing well until she developed fever and drainage of foul-smelling fluid from her lower abdominal incision. She returned to the OR for exploratory ex-lap 6/16, found to have bowel contents throughout her abdomen. She required excision of the mesh and identification of her site of perforation, which was repaired then resected with end-to-end anastamosis. Her abdomen was unable to be closed and despite extensive irrigation, she will require additional washout. She had cultures collected intraoperatively. Zosyn was given in the OR.  She was brought to the ICU intubated, sedated, paralyzed post-op. She will remain intubated until she is able to return to the OR to be closed.   She has a complicated abdominal surgery history with previous hysterectomy, multiple incisional hernias in the past. She has had post-op ileus complicating several previous surgeries.   Pertinent  Medical History  Obesity Diabetes Hypertension  Significant Hospital Events: Including procedures, antibiotic start and stop dates in addition to other pertinent events   6/10 hernia repair with mesh 6/14 Urine output decreased overnight 10  6/16 ex lap resection of small bowel perf. Gross contamination of space. Left open and intubated. Pccm consult   6/18 ex lap washout. Significantly dilated bowel, turbid fluid. Left open  6/19 started TPN 6/20 plan for OR today for washout  6/21 adding fluc. ID consult  6/22 to return the OR, abdomen closed, large wound VAC in place 6/24 continued Vent weaning, continued ABX, wean sedation as able. CXR repeat shows mild R atelectasis, Increasing  PEEP to 8 and attempting to wean down/shut off versed gtt in preparation for extubation when able 6/25 trial SBT today, pt remains on fentanyl gtt however versed gtt off and transitioned to PRN pushes. Follows some simple commands  Interim History / Subjective:  Remains on fentanyl gtt at , versed transitioned to PRN pushes, gtt off. Pt follows some simple commands and signals with arms that she wants the breathing tube removed. When asked if she wanted tube out, she nods head signaling yes.   Objective   Blood pressure (!) 147/64, pulse 96, temperature (!) 100.4 F (38 C), resp. rate (!) 27, height 5\' 5"  (1.651 m), weight 105.8 kg, SpO2 99 %. CVP:  [13 mmHg-14 mmHg] 14 mmHg  Vent Mode: PRVC FiO2 (%):  [28 %-30 %] 30 % Set Rate:  [20 bmp] 20 bmp Vt Set:  [460 mL] 460 mL PEEP:  [5 cmH20-8 cmH20] 8 cmH20 Pressure Support:  [12 cmH20] 12 cmH20 Plateau Pressure:  [16 cmH20-18 cmH20] 17 cmH20   Intake/Output Summary (Last 24 hours) at 11/07/2022 0738 Last data filed at 11/07/2022 0600 Gross per 24 hour  Intake 2831 ml  Output 2490 ml  Net 341 ml   Filed Weights   11/04/22 0500 11/05/22 0705 11/07/22 0500  Weight: 105 kg 107.2 kg 105.8 kg   Examination: General:  Critically ill female pt intubated and sedated. HEENT: MM pink/moist Neuro: Intubated on Ventilator and sedation, follows simple commands, PERRLA  CV: s1s2 RRR, no m/r/g PULM:  Mechanically vented, clear BL without any rhonchi or wheezing. Synchronous with vent with equal chest rise. Clear secretions GI: soft, bsx4 active  Extremities: warm/dry, no edema BL Skin: no rashes or lesions  Pt notably has uptrending NA at 147 and persistent leukocytosis which remains improved from prior draws at 23.2.   Resolved Hospital Problem list    Hyperkalemia,  Hyponatremia  Assessment & Plan:   Post operative ventilator management: Due to open abdomen primarily. -- PRVC, minimize tidal volumes as able --VAP bundle,  stress ulcer prophylaxis -- PAD with fentanyl, midazolam gtt, weaning sedation, pressure support trials versus SBT and extubation if able -- Consider further Lasix IV PRN    Peritonitis: Due to bowel perforation, ongoing purulent material in the abdomen on takeback 6/20.  Intraoperative cultures are polymicrobial. Still with leukocytosis and fevers, continue to monitor and trend  -- Continue Zosyn -- Fluconazole added 6/21 for yeast in culture -- ID consult, appreciate recommendations   Bowel perforation status post repair: -- Appreciate surgery assistance, abdomen close 6/22- surgical team last changed wound vac 6/24 -- Gastric tube to low intermittent wall suction -- TPN    Diabetes: -- SSI   Hypophosphatemia- (resolved) --Replaced with sodium phos IVF, latest serum phos 2.5 this AM --Continue to monitor with AM labs   Postoperative pain control: -- Fentanyl gtt for now- wean as tolerated, unable to use gut so holding home medications   Fever: Suspect related to postop, also with peritonitis, infection Tmax overnight appears to be 101.17F, remains on cooling blanket. ID Following and believes to be related to burden of abdominal infection and post op status --continue cooling blanket, Tylenol as needed  Best Practice (right click and "Reselect all SmartList Selections" daily)   Diet/type: NPO;  TPN DVT prophylaxis: LMWH GI prophylaxis: PPI Lines: Central line/ PICC RUE Foley:  Yes, and it is still needed Code Status:  full code Last date of multidisciplinary goals of care discussion- pt updated daily  CRITICAL CARE Performed by: Janeece Riggers   Total critical care time: 35 minutes  Critical care time was exclusive of separately billable procedures and treating other patients. Critical care was necessary to treat or prevent imminent or life-threatening deterioration.  Critical care was time spent personally by me on the following activities: development of treatment  plan with patient and/or surrogate as well as nursing, discussions with consultants, evaluation of patient's response to treatment, examination of patient, obtaining history from patient or surrogate, ordering and performing treatments and interventions, ordering and review of laboratory studies, ordering and review of radiographic studies, pulse oximetry and re-evaluation of patient's condition.  Janeece Riggers, NP [facility] Pulmonary/Critical Care Medicine Amion for contact 11/07/2022, 7:38 AM

## 2022-11-07 NOTE — Progress Notes (Addendum)
PHARMACY - TOTAL PARENTERAL NUTRITION CONSULT NOTE   Indication: Prolonged ileus  Patient Measurements: Height: 5\' 5"  (165.1 cm) Weight: 105.8 kg (233 lb 4 oz) IBW/kg (Calculated) : 57 TPN AdjBW (KG): 71.4 Body mass index is 38.81 kg/m.  Assessment:  Pharmacy consulted to start TPN on a 57 yo pt with complicated robotic recurrent incisional hernia repair with mesh, bilateral posterior rectus myofascial release, bilateral transversus abdominis release and removal of previous polypropylene mesh with prolonged lyses of adhesions on 10/23/22. This was complicated by a bowel perforation requiring return to OR on 6/16, 6/18, 6/20, and 6/22.   Glucose / Insulin:  -A1c on 6/3 is 8.5 -Pt on mSSI q4h  -CBGs have ranged 137-162 in past 24 hours (target <150) -Previous 24 hour insulin use: 16 units  Electrolytes: Na 148, corrected Ca 9.62, Mg 2.5 Renal: SCr stable, BUN slightly up Hepatic:  -WNL, albumin remains low -TG 406 - lipids out of TPN for now Intake / Output; MIVF:  -UOP > 2 L -Drains 290 mL -Emesis/NG 200 mL GI Imaging: NA GI Surgeries / Procedures:  - 6/10 Robotic incisional hernia repair - 6/16 exploratory laparotomy, repair of small bowel perforation  - 6/18 exploratory laparotomy, wash out of abdomen and wound vac placement  - 6/20 exploratory laparotomy, wound wash out of abdomen and wound vac change  - 6/22 absorbable mesh closure of abdomen, application of wound vac   Central access: PICC  TPN start date: 11/01/22  Nutritional Goals: Goal TPN rate is 100 mL/hr  RD Assessment: Estimated Needs Total Energy Estimated Needs: 1900-2200 kcals Total Protein Estimated Needs: 115-145 grams Total Fluid Estimated Needs: >/= 1.9L  Current Nutrition:  NPO  Plan:  -D5 per MD for hypernatremia -Continue TPN at 80 mL/hr -Keep lipids out of TPN for now. Protein at 130 g and dextrose at 307 g - total kcal 1566 which is approximately 80% of needs. -Recheck TG on  Thursday -Electrolytes in TPN: Na 40 mEq/L, K 60 mEq/L, Ca 5 mEq/L, Mg 0 mEq/L, Phos 10 mmol/L. Cl:Ac 1:2 -Add standard MVI and trace elements to TPN -Continue Moderate q4h SSI and adjust as needed  -Monitor TPN labs on Mon/Thurs at minimum   Thank you for allowing pharmacy to be a part of this patient's care.  Pricilla Riffle, PharmD, BCPS Clinical Pharmacist 11/07/2022 9:24 AM

## 2022-11-07 NOTE — Progress Notes (Signed)
3 Days Post-Op   Subjective/Chief Complaint: Still having fevers.  Difficult to level sedation to wean to extubated.  Objective: Vital signs in last 24 hours: Temp:  [99.8 F (37.7 C)-101.3 F (38.5 C)] 100.4 F (38 C) (06/25 1000) Pulse Rate:  [65-110] 97 (06/25 1000) Resp:  [15-36] 32 (06/25 1000) BP: (94-161)/(35-90) 160/86 (06/25 1000) SpO2:  [95 %-100 %] 98 % (06/25 1114) FiO2 (%):  [28 %-30 %] 28 % (06/25 1114) Weight:  [105.8 kg] 105.8 kg (06/25 0500) Last BM Date : 10/28/22  Intake/Output from previous day: 06/24 0701 - 06/25 0700 In: 2831 [I.V.:2212.3; IV Piggyback:618.7] Out: 2490 [Urine:1950; Emesis/NG output:200; Drains:340] Intake/Output this shift: Total I/O In: 1003.4 [I.V.:749.3; IV Piggyback:254] Out: 600 [Urine:600]  Exam: Intubated and sedated Abdomen soft, Wound vac changed at bedside  Lab Results:  Recent Labs    11/06/22 0815 11/07/22 0545  WBC 21.4* 20.4*  HGB 8.1* 8.0*  HCT 28.7* 28.0*  PLT 436* 446*    BMET Recent Labs    11/06/22 0630 11/07/22 0545  NA 147* 148*  K 4.2 4.2  CL 108 113*  CO2 26 25  GLUCOSE 175* 162*  BUN 31* 29*  CREATININE 0.68 0.70  CALCIUM 7.4* 7.7*    PT/INR No results for input(s): "LABPROT", "INR" in the last 72 hours. ABG No results for input(s): "PHART", "HCO3" in the last 72 hours.  Invalid input(s): "PCO2", "PO2"  Studies/Results: DG Chest 1 View  Result Date: 11/06/2022 CLINICAL DATA:  Difficulty weaning from ventilator. EXAM: CHEST  1 VIEW COMPARISON:  Chest radiograph 10/29/2022. FINDINGS: The endotracheal tube tip is proximally 2.4 cm from the carina. The right upper extremity PICC tip is in the region of the cavoatrial junction. The enteric catheter tip is not included within the field of view. The cardiomediastinal silhouette is stable allowing for rightward patient rotation. There is probable mild atelectasis in the right lung base with slight asymmetric elevation of the right hemidiaphragm.  Otherwise, there is no focal airspace opacity. There is no evidence of overt pulmonary edema. There is no significant pleural effusion. There is no pneumothorax There is no acute osseous abnormality. IMPRESSION: 1. Endotracheal tube tip and right upper extremity PICC tip in appropriate position. 2. Probable mild right basilar atelectasis. Electronically Signed   By: Lesia Hausen M.D.   On: 11/06/2022 08:07    Anti-infectives: Anti-infectives (From admission, onward)    Start     Dose/Rate Route Frequency Ordered Stop   11/05/22 1215  vancomycin (VANCOREADY) IVPB 1750 mg/350 mL        1,750 mg 175 mL/hr over 120 Minutes Intravenous Every 24 hours 11/04/22 1125     11/04/22 1215  vancomycin (VANCOREADY) IVPB 2000 mg/400 mL        2,000 mg 200 mL/hr over 120 Minutes Intravenous STAT 11/04/22 1125 11/04/22 1402   11/04/22 1000  fluconazole (DIFLUCAN) IVPB 400 mg        400 mg 100 mL/hr over 120 Minutes Intravenous Every 24 hours 11/03/22 0801     11/03/22 0845  fluconazole (DIFLUCAN) IVPB 800 mg        800 mg 100 mL/hr over 240 Minutes Intravenous  Once 11/03/22 0759 11/03/22 1327   10/28/22 1000  azithromycin (ZITHROMAX) tablet 250 mg  Status:  Discontinued       See Hyperspace for full Linked Orders Report.   250 mg Oral Daily 10/27/22 0738 10/27/22 1653   10/27/22 1700  piperacillin-tazobactam (ZOSYN) IVPB 3.375 g  3.375 g 12.5 mL/hr over 240 Minutes Intravenous Every 8 hours 10/27/22 1653     10/27/22 1000  azithromycin (ZITHROMAX) tablet 500 mg       See Hyperspace for full Linked Orders Report.   500 mg Oral Daily 10/27/22 0738 10/27/22 0946   10/23/22 1054  ceFAZolin (ANCEF) 2-4 GM/100ML-% IVPB       Note to Pharmacy: Augustine Radar R: cabinet override      10/23/22 1054 10/23/22 2259   10/23/22 0600  ceFAZolin (ANCEF) IVPB 3g/100 mL premix        3 g 200 mL/hr over 30 Minutes Intravenous On call to O.R. 10/23/22 0540 10/23/22 1213       Assessment/Plan: Ms. Rettinger  underwent difficult robotic recurrent incisional hernia repair with mesh, bilateral posterior rectus myofascial release, bilateral transversus abdominis release and removal of previous polypropylene mesh with prolonged lyses of adhesions on 10/23/22. This was complicated by bowel perforation, possible missed enterotomy during dissection of small intestine off mesh, requiring return to the operating room with washout, small bowel resection and ABTHERA placement on 10/29/22.  Returned to the OR for wash out 6/18 and 6/20.  Underwent bridging retromuscular Phasix mesh closure of the abdomen 11/04/22.  ID consult for polymicrobial infection - continue antibiotics Fevers may be related to phasix and vicryl mesh placed in a contaminated field.  Will need long term antibiotics.  Plan for CT scan 5-7 days post op to evaluate for undrained fluid collection but vac should be draining retromuscular space and hopefully Jps draining intra-abdominal space I will change wound vac tomorrow morning, appreciate wound/ostomy team assistance. Continue NPO till bowel movements. Still high risk for fistula formation Critical care team working towards extubation.  Appreciate CCM and ID involvement  [name], MD  11/07/2022

## 2022-11-07 NOTE — Progress Notes (Signed)
Nutrition Follow-up  DOCUMENTATION CODES:   Morbid obesity  INTERVENTION:  - Plan to continue TPN without lipids at this time due to elevated triglycerides: TPN at 21mL/hr and provides 130g protein and 307g dextrose = 1566kcal/day - TPN management per Pharmacy. TPN goal is 151mL/hr.   - Monitor magnesium, potassium, and phosphorus.   - Daily weights while on TPN   NUTRITION DIAGNOSIS:   Inadequate oral intake related to inability to eat as evidenced by NPO status. *ongoing  GOAL:   Patient will meet greater than or equal to 90% of their needs *progressing, on TPN  MONITOR:   Vent status, Labs, Weight trends  REASON FOR ASSESSMENT:   Consult New TPN/TNA  ASSESSMENT:   57 y.o. female with PMH HTN and Diabetes who presented for robotic hernia repair now complicated by bowel perforation.  6/10 Admit, s/p hernia repair with lysis of adhesions 6/16 patient went to bathroom and had large amount of foul smelling fluid gush from abdominal wall; taken to surgery emergently -> found to have small bowel injury with perforation and contamination of abdominal cavity s/p ex-lap, resection of small bowel perf, ABThera wound VAC placed with open abdomen 6/18 ex-lap, washout of abdomen, ABThera wound VAC placement 6/20 repeat ex-lap, washout of abdomen, ABThera wound VAC placement 6/22 closure of abdomen, wound vac to incision  Patient remains intubated. Plan for SBT today with hopeful extubation. NGT remains to LIS.  Remains on TPN. Triglycerides came back at 406 yesterday so lipids removed from TPN. Per pharmacy, plan to continue TPN without lipids for now.   Admit weight: 252# Current weight: 233# Possible weight loss since admit, will monitor. Patient has not been able to reach goal TPN since starting on 6/19.  Medications reviewed and include: Insulin, Miralax  Labs reviewed:  Na 148 Magnesium 2.5 HA1C 8.5 Blood Glucose 137-162 x24 hours Triglycerides 406  (6/24)   Diet Order:   Diet Order             Diet NPO time specified  Diet effective midnight                   EDUCATION NEEDS:  Not appropriate for education at this time  Skin:  Skin Assessment: Reviewed RN Assessment Skin Integrity Issues:: Incisions Incisions: Abdomen  Last BM:  6/15  Height:  Ht Readings from Last 1 Encounters:  11/07/22 5\' 5"  (1.651 m)   Weight:  Wt Readings from Last 1 Encounters:  11/07/22 105.8 kg   Ideal Body Weight:  56.82 kg  BMI:  Body mass index is 38.81 kg/m.  Estimated Nutritional Needs:  Kcal:  1900-2200 kcals Protein:  115-145 grams Fluid:  >/= 1.9L    Shelle Iron RD, LDN For contact information, refer to [facility].

## 2022-11-08 DIAGNOSIS — J9600 Acute respiratory failure, unspecified whether with hypoxia or hypercapnia: Secondary | ICD-10-CM | POA: Diagnosis not present

## 2022-11-08 DIAGNOSIS — K432 Incisional hernia without obstruction or gangrene: Secondary | ICD-10-CM | POA: Diagnosis not present

## 2022-11-08 LAB — GLUCOSE, CAPILLARY
Glucose-Capillary: 134 mg/dL — ABNORMAL HIGH (ref 70–99)
Glucose-Capillary: 147 mg/dL — ABNORMAL HIGH (ref 70–99)
Glucose-Capillary: 145 mg/dL — ABNORMAL HIGH (ref 70–99)
Glucose-Capillary: 164 mg/dL — ABNORMAL HIGH (ref 70–99)
Glucose-Capillary: 146 mg/dL — ABNORMAL HIGH (ref 70–99)
Glucose-Capillary: 152 mg/dL — ABNORMAL HIGH (ref 70–99)
Glucose-Capillary: 134 mg/dL — ABNORMAL HIGH (ref 70–99)
Glucose-Capillary: 127 mg/dL — ABNORMAL HIGH (ref 70–99)
Glucose-Capillary: 146 mg/dL — ABNORMAL HIGH (ref 70–99)

## 2022-11-08 LAB — TYPE AND SCREEN
ABO/RH(D): A POS
Unit division: 0
Unit division: 0

## 2022-11-08 LAB — CULTURE, BLOOD (ROUTINE X 2)
Culture: NO GROWTH
Culture: NO GROWTH

## 2022-11-08 LAB — BPAM RBC
Blood Product Expiration Date: 202407142359
Unit Type and Rh: 6200

## 2022-11-08 LAB — BASIC METABOLIC PANEL
Anion gap: 4 — ABNORMAL LOW (ref 5–15)
BUN: 22 mg/dL — ABNORMAL HIGH (ref 6–20)
CO2: 25 mmol/L (ref 22–32)
Calcium: 7.5 mg/dL — ABNORMAL LOW (ref 8.9–10.3)
Chloride: 113 mmol/L — ABNORMAL HIGH (ref 98–111)
Creatinine, Ser: 0.57 mg/dL (ref 0.44–1.00)
GFR, Estimated: >60 mL/min (ref >60)
Glucose, Bld: 138 mg/dL — ABNORMAL HIGH (ref 70–99)
Potassium: 4 mmol/L (ref 3.5–5.1)
Sodium: 142 mmol/L (ref 135–145)

## 2022-11-08 MED ORDER — SODIUM CHLORIDE 0.9% FLUSH: 9.0000 mL | INTRAVENOUS | Status: DC | PRN

## 2022-11-08 MED ORDER — NALOXONE HCL 0.4 MG/ML IJ SOLN: 0.4000 mg | INTRAMUSCULAR | Status: DC | PRN

## 2022-11-08 MED ORDER — ONDANSETRON HCL 4 MG/2ML IJ SOLN: 4.0000 mg | Freq: Four times a day (QID) | INTRAMUSCULAR | Status: DC | PRN

## 2022-11-08 MED ORDER — LIDOCAINE HCL 4 % EX SOLN
Freq: Every day | CUTANEOUS | Status: DC | PRN
  Filled 2022-11-08: qty 50

## 2022-11-08 MED ORDER — LIDOCAINE HCL 4 % EX SOLN: Freq: Every day | CUTANEOUS | Status: DC | PRN

## 2022-11-08 MED ORDER — DIPHENHYDRAMINE HCL 50 MG/ML IJ SOLN: 12.5000 mg | Freq: Four times a day (QID) | INTRAMUSCULAR | Status: DC | PRN

## 2022-11-08 MED ORDER — DIPHENHYDRAMINE HCL 12.5 MG/5ML PO ELIX: 12.5000 mg | ORAL_SOLUTION | Freq: Four times a day (QID) | ORAL | Status: DC | PRN

## 2022-11-08 MED ORDER — FENTANYL 50 MCG/ML IV PCA SOLN
INTRAVENOUS | Status: DC
  Administered 2022-11-08: 40 ug via INTRAVENOUS
  Administered 2022-11-08: 20 ug via INTRAVENOUS
  Administered 2022-11-08: 40 ug via INTRAVENOUS
  Filled 2022-11-08: qty 25

## 2022-11-08 MED ORDER — TRAVASOL 10 % IV SOLN
INTRAVENOUS | Status: AC
  Filled 2022-11-08: qty 1305.6

## 2022-11-08 MED ORDER — FENTANYL BOLUS VIA INFUSION: 50.0000 ug | INTRAVENOUS | Status: DC | PRN

## 2022-11-08 MED ORDER — HYDROCORTISONE-ACETIC ACID 1-2 % OT SOLN
4.0000 [drp] | Freq: Four times a day (QID) | OTIC | Status: AC
  Administered 2022-11-08 – 2022-11-11 (×11): 4 [drp] via OTIC
  Filled 2022-11-08 (×3): qty 10

## 2022-11-08 NOTE — Progress Notes (Signed)
OT Cancellation Note  Patient Details Name: Kaitlyn Bray MRN: [id number] DOB: [birth year]   Cancelled Treatment:    Reason Eval/Treat Not Completed: Other (comment) Patient extubated this AM with patient fatigued this afternoon having just finished washing up with nursing. OT to continue to follow and check back as schedule will allow on 6/27.  Rosalio Loud, MS Acute Rehabilitation Department Office# [phone]  11/08/2022, 2:39 PM

## 2022-11-08 NOTE — Progress Notes (Signed)
NAME:  Kaitlyn Bray, MRN:  [id number], DOB:  March 25, 1966, LOS: 16 ADMISSION DATE:  10/23/2022, CONSULTATION DATE:  6/16 REFERRING MD:  Cornett- surgery, CHIEF COMPLAINT:  post-op vent   History of Present Illness:  Kaitlyn Bray is a 57 y/o woman with a history of a ventral hernia with recent robotic incisional hernia repair on 6/10 who had been progressing well until she developed fever and drainage of foul-smelling fluid from her lower abdominal incision. She returned to the OR for exploratory ex-lap 6/16, found to have bowel contents throughout her abdomen. She required excision of the mesh and identification of her site of perforation, which was repaired then resected with end-to-end anastamosis. Her abdomen was unable to be closed and despite extensive irrigation, she will require additional washout. She had cultures collected intraoperatively. Zosyn was given in the OR.  She was brought to the ICU intubated, sedated, paralyzed post-op. She will remain intubated until she is able to return to the OR to be closed.   She has a complicated abdominal surgery history with previous hysterectomy, multiple incisional hernias in the past. She has had post-op ileus complicating several previous surgeries.   Pertinent  Medical History  Obesity Diabetes Hypertension  Significant Hospital Events: Including procedures, antibiotic start and stop dates in addition to other pertinent events   6/10 hernia repair with mesh 6/14 Urine output decreased overnight 10  6/16 ex lap resection of small bowel perf. Gross contamination of space. Left open and intubated. Pccm consult   6/18 ex lap washout. Significantly dilated bowel, turbid fluid. Left open  6/19 started TPN 6/20 plan for OR today for washout  6/21 adding fluc. ID consult  6/22 to return the OR, abdomen closed, large wound VAC in place 6/24 continued Vent weaning, continued ABX, wean sedation as able. CXR repeat shows mild R atelectasis, Increasing  PEEP to 8 and attempting to wean down/shut off versed gtt in preparation for extubation when able 6/25 trial SBT today, pt remains on fentanyl gtt however versed gtt off and transitioned to PRN pushes. Follows some simple commands 6/26 remains intubated, on Fentanyl gtt for sedation, able to follow commands. Trial SBT and possibly extubate if able  Interim History / Subjective:  Remains on fentanyl gtt at . Pt follows some simple commands and signals with arms that she wants the breathing tube removed. When asked if she wanted tube out, she nods head signaling yes. Will trial SBT today and extubate if able  Objective   Blood pressure 120/89, pulse 83, temperature 98.4 F (36.9 C), temperature source Core, resp. rate 20, height 5\' 5"  (1.651 m), weight 110.2 kg, SpO2 100 %. CVP:  [13 mmHg] 13 mmHg  Vent Mode: PSV;CPAP FiO2 (%):  [28 %] 28 % Set Rate:  [20 bmp] 20 bmp Vt Set:  [460 mL] 460 mL PEEP:  [5 cmH20-8 cmH20] 5 cmH20 Pressure Support:  [5 cmH20-8 cmH20] 8 cmH20 Plateau Pressure:  [16 cmH20-19 cmH20] 18 cmH20   Intake/Output Summary (Last 24 hours) at 11/08/2022 0801 Last data filed at 11/08/2022 0600 Gross per 24 hour  Intake 4666.88 ml  Output 3290 ml  Net 1376.88 ml   Filed Weights   11/05/22 0705 11/07/22 0500 11/08/22 0500  Weight: 107.2 kg 105.8 kg 110.2 kg   Examination: General:  Critically ill female pt intubated and sedated. HEENT: MM pink/moist Neuro: Intubated on Ventilator and sedation, follows simple commands, PERRLA  CV: s1s2 RRR, no m/r/g PULM:  Mechanically vented, clear  BL without any rhonchi or wheezing. Synchronous with vent with equal chest rise. Clear secretions GI: soft, bsx4 active  Extremities: warm/dry, no edema BL Skin: no rashes or lesions   Resolved Hospital Problem list    Hyperkalemia,  Hyponatremia  Assessment & Plan:   Post operative ventilator management: Due to open abdomen primarily. -- PRVC, minimize tidal  volumes as able --VAP bundle, stress ulcer prophylaxis -- PAD with fentanyl, weaning sedation, pressure support trials versus SBT and extubation if able -- Consider further Lasix IV PRN    Peritonitis: Due to bowel perforation, ongoing purulent material in the abdomen on takeback 6/20.  Intraoperative cultures are polymicrobial. Still with leukocytosis and fevers, continue to monitor and trend  -- Continue Zosyn -- Fluconazole added 6/21 for yeast in culture -- ID consult, appreciate recommendations   Bowel perforation status post repair: -- Appreciate surgery assistance, abdomen close 6/22- surgical team last changed wound vac 6/24, likely will undergo vac change 6/27  -- Gastric tube to low intermittent wall suction -- TPN    Diabetes: -- SSI   Hypophosphatemia- (resolved) --Replaced with sodium phos IVF --Continue to monitor with AM labs   Postoperative pain control: -- Fentanyl gtt for now- wean as tolerated, unable to use gut so holding home medications   Fever: Suspect related to postop, also with peritonitis, infection Tmax overnight appears to be 100.31F, remains on cooling blanket. ID Following and believes to be related to burden of abdominal infection and post op status --continue cooling blanket, Tylenol scheduled  Best Practice (right click and "Reselect all SmartList Selections" daily)   Diet/type: NPO;  TPN DVT prophylaxis: LMWH GI prophylaxis: PPI Lines: Central line/ PICC RUE Foley:  Yes, and it is still needed Code Status:  full code Last date of multidisciplinary goals of care discussion- pt updated daily  CRITICAL CARE Performed by: Janeece Riggers   Total critical care time: 35 minutes  Critical care time was exclusive of separately billable procedures and treating other patients. Critical care was necessary to treat or prevent imminent or life-threatening deterioration.  Critical care was time spent personally by me on the following activities:  development of treatment plan with patient and/or surrogate as well as nursing, discussions with consultants, evaluation of patient's response to treatment, examination of patient, obtaining history from patient or surrogate, ordering and performing treatments and interventions, ordering and review of laboratory studies, ordering and review of radiographic studies, pulse oximetry and re-evaluation of patient's condition.  Janeece Riggers, NP [facility] Pulmonary/Critical Care Medicine Amion for contact 11/08/2022, 8:01 AM

## 2022-11-08 NOTE — Procedures (Signed)
Extubation Procedure Note  Patient Details:   Name: Kaitlyn Bray DOB: 1966/02/10 MRN: [id number]   Airway Documentation:    Vent end date: 11/08/22 Vent end time: 0848   Evaluation  O2 sats: stable throughout Complications: No apparent complications Patient did tolerate procedure well. Bilateral Breath Sounds: Clear, Diminished   Yes  Pt was extubated to 2L [place] per CCM-NP order. Pt tolerated well with saturations of 100%. Pt was suctioned and had a positive cuff leak prior to extubation. Pt was able to talk and had a good cough afterwards. No stridor is heard at this time. RN at bedside.   Loriann Bosserman A Jalen Oberry 11/08/2022, 8:49 AM

## 2022-11-08 NOTE — Progress Notes (Signed)
4 Days Post-Op   Subjective/Chief Complaint: Very awake this morning and all night  Objective: Vital signs in last 24 hours: Temp:  [98.4 F (36.9 C)-100.4 F (38 C)] 98.4 F (36.9 C) (06/26 0400) Pulse Rate:  [58-115] 83 (06/26 0700) Resp:  [12-62] 20 (06/26 0700) BP: (109-178)/(60-90) 120/89 (06/26 0700) SpO2:  [97 %-100 %] 98 % (06/26 0700) FiO2 (%):  [28 %] 28 % (06/26 0318) Weight:  [110.2 kg] 110.2 kg (06/26 0500) Last BM Date : 10/28/22  Intake/Output from previous day: 06/25 0701 - 06/26 0700 In: 4824 [I.V.:3772.4; IV Piggyback:1051.6] Out: 2790 [Urine:2500; Drains:290] Intake/Output this shift: No intake/output data recorded.  Exam: Intubated and sedated Abdomen soft, Wound vac changed at bedside  Lab Results:  Recent Labs    11/06/22 0815 11/07/22 0545  WBC 21.4* 20.4*  HGB 8.1* 8.0*  HCT 28.7* 28.0*  PLT 436* 446*    BMET Recent Labs    11/07/22 0545 11/08/22 0449  NA 148* 142  K 4.2 4.0  CL 113* 113*  CO2 25 25  GLUCOSE 162* 138*  BUN 29* 22*  CREATININE 0.70 0.57  CALCIUM 7.7* 7.5*    PT/INR No results for input(s): "LABPROT", "INR" in the last 72 hours. ABG No results for input(s): "PHART", "HCO3" in the last 72 hours.  Invalid input(s): "PCO2", "PO2"  Studies/Results: DG Chest 1 View  Result Date: 11/06/2022 CLINICAL DATA:  Difficulty weaning from ventilator. EXAM: CHEST  1 VIEW COMPARISON:  Chest radiograph 10/29/2022. FINDINGS: The endotracheal tube tip is proximally 2.4 cm from the carina. The right upper extremity PICC tip is in the region of the cavoatrial junction. The enteric catheter tip is not included within the field of view. The cardiomediastinal silhouette is stable allowing for rightward patient rotation. There is probable mild atelectasis in the right lung base with slight asymmetric elevation of the right hemidiaphragm. Otherwise, there is no focal airspace opacity. There is no evidence of overt pulmonary edema. There is  no significant pleural effusion. There is no pneumothorax There is no acute osseous abnormality. IMPRESSION: 1. Endotracheal tube tip and right upper extremity PICC tip in appropriate position. 2. Probable mild right basilar atelectasis. Electronically Signed   By: Lesia Hausen M.D.   On: 11/06/2022 08:07    Anti-infectives: Anti-infectives (From admission, onward)    Start     Dose/Rate Route Frequency Ordered Stop   11/05/22 1215  vancomycin (VANCOREADY) IVPB 1750 mg/350 mL        1,750 mg 175 mL/hr over 120 Minutes Intravenous Every 24 hours 11/04/22 1125     11/04/22 1215  vancomycin (VANCOREADY) IVPB 2000 mg/400 mL        2,000 mg 200 mL/hr over 120 Minutes Intravenous STAT 11/04/22 1125 11/04/22 1402   11/04/22 1000  fluconazole (DIFLUCAN) IVPB 400 mg        400 mg 100 mL/hr over 120 Minutes Intravenous Every 24 hours 11/03/22 0801     11/03/22 0845  fluconazole (DIFLUCAN) IVPB 800 mg        800 mg 100 mL/hr over 240 Minutes Intravenous  Once 11/03/22 0759 11/03/22 1327   10/28/22 1000  azithromycin (ZITHROMAX) tablet 250 mg  Status:  Discontinued       See Hyperspace for full Linked Orders Report.   250 mg Oral Daily 10/27/22 0738 10/27/22 1653   10/27/22 1700  piperacillin-tazobactam (ZOSYN) IVPB 3.375 g        3.375 g 12.5 mL/hr over 240 Minutes Intravenous Every  8 hours 10/27/22 1653     10/27/22 1000  azithromycin (ZITHROMAX) tablet 500 mg       See Hyperspace for full Linked Orders Report.   500 mg Oral Daily 10/27/22 0738 10/27/22 0946   10/23/22 1054  ceFAZolin (ANCEF) 2-4 GM/100ML-% IVPB       Note to Pharmacy: Augustine Radar R: cabinet override      10/23/22 1054 10/23/22 2259   10/23/22 0600  ceFAZolin (ANCEF) IVPB 3g/100 mL premix        3 g 200 mL/hr over 30 Minutes Intravenous On call to O.R. 10/23/22 0540 10/23/22 1213       Assessment/Plan: Ms. Mertens underwent difficult robotic recurrent incisional hernia repair with mesh, bilateral posterior rectus  myofascial release, bilateral transversus abdominis release and removal of previous polypropylene mesh with prolonged lyses of adhesions on 10/23/22. This was complicated by bowel perforation, possible missed enterotomy during dissection of small intestine off mesh, requiring return to the operating room with washout, small bowel resection and ABTHERA placement on 10/29/22.  Returned to the OR for wash out 6/18 and 6/20.  Underwent bridging retromuscular Phasix mesh closure of the abdomen 11/04/22.  ID consult for polymicrobial infection - continue antibiotics Fevers may be related to phasix and vicryl mesh placed in a contaminated field.  Will need long term antibiotics.  Plan for CT scan 5-7 days post op to evaluate for undrained fluid collection but vac should be draining retromuscular space and hopefully Jps draining intra-abdominal space I will hold off on wound vac change till tomorrow as the patient appears able to be extubated this morning, and I wouldn't want a wound vac change getting in the way of removing the ET tube.  Pain should be able to be controlled more easily once ET tube is removed.   Continue NPO till bowel movements. Still high risk for fistula formation Critical care team working towards extubation.  Appreciate CCM and ID involvement  [name], MD  11/08/2022

## 2022-11-08 NOTE — Progress Notes (Signed)
[facility] Infectious Diseases Follow Up Note  Patient Identification: Patient Name: Kaitlyn Bray MRN: [id number] Admit Date: 10/23/2022  5:34 AM Age: 57 y.o.Today's Date: 11/08/2022  Reason for Visit: Complicated intra-abdominal infection, feculent peritonitis  Principal Problem:   Recurrent ventral hernia Active Problems:   Peritonitis (HCC)   Endotracheally intubated   Small bowel perforation (HCC)  Antibiotics:  Pip-tazo 6/14-c Azithromycin 6/14 Vancomycin 6/22-c   Lines/Hardware: PICC right arm  Interval Events: Low grade fevers, downtrending, afebrile in the last 24 hrs  Labs with downtrending leukocytosis S/p extubation this morning   Assessment 57 YO female with Type 2 DM and multiple abdominal surgeries admitted with    # Post surgical complicated intraabdominal infection/Feculent peritonitis  - most recently had robotic incisional hernia repair with mesh ( vicryl mesh patch of posterior layer of repair) , removal of previous intraperitoneal mesh and lysis of adhesions, bilateral transversus abdominis and bilateral posterior rectus myofascial release on 6/10 who had been progressing well until started developing fever and drainage of foul-smelling fluid from her lower abdominal incision site - underwent OR for exploratory ex lap with resection of small bowel perforation and primary anastomosis and excision of mesh with placement of ABThera wound VAC and open abdomen to complete loss of domain and peritonitis with significant gross contamination of the peritoneal and subcutaneous space( faeculent peritonitis) on 6/16, found to have bowel contents throughout her abdomen. Cultures polymicrobial ( group G streptococci, actinomyecs, bacteroides ovatus beta lactamase positive and candida albicans).  Her abdomen was unable to be closed - underwent repeat OR on 6/18 for ex lap, washout of abdomen and ABThera wound VAC placement  then  - 6/20 lap with washout of abdomen and ABThera wound VAC placement  - 6/22 retromuscular absorbable mesh bridging closure of abdomen and application of wound VAC. D/w Dr Dossie Der, all prior mesh was completely removed and absorbable mesh placed which may take 9 months to absorb  # Fevers/leukocytosis  - fevers and WBC count downtrending, 6/21 blood cx NG in 5 days. PICC does not appear to be infected. Likely all related to burden of abdominal infection and post op status   Recommendations Will DC Vancomycin as low suspicion for MRSA  Continue zosyn and fluconazole  Monitor CBC and CMP on abtx  Post op care per surgery , plan for repeat  CT in 5 to 7 days postop to evaluate for undrained fluid collection noted Will follow intermittently this week, please call with questions   Rest of the management as per the primary team. Thank you for the consult. Please page with pertinent questions or concerns.  ______________________________________________________________________ Subjective patient seen and examined at the bedside. More awake and follows commands s/p extubation   Vitals BP 135/66 (BP Location: Left Arm)   Pulse 70   Temp 98.8 F (37.1 C) (Core)   Resp 16   Ht 5\' 5"  (1.651 m)   Wt 110.2 kg   SpO2 99%   BMI 40.43 kg/m     Physical Exam Constitutional: morbidly obese female lying in the bed    Comments: Opens eyes and follows commands   Cardiovascular:     Rate and Rhythm: Normal rate and regular rhythm.     Heart sounds:   Pulmonary:     Effort: Pulmonary effort is normal on [place]     Comments: Coarse breath sounds bilaterally  Abdominal:     Palpations: Abdomen is soft.     Tenderness: Midline wound VAC no surrounding cellulitis  or leak, LUQ and RUQ drain   Musculoskeletal:        General: No swelling or tenderness in peripheral joints   Skin:    Comments: no rashes   Pertinent Microbiology Results for orders placed or performed during the hospital  encounter of 10/23/22  MRSA Next Gen by PCR, Nasal     Status: None   Collection Time: 10/29/22  4:49 PM   Specimen: Nasal Mucosa; Nasal Swab  Result Value Ref Range Status   MRSA by PCR Next Gen NOT DETECTED NOT DETECTED Final    Comment: (NOTE) The GeneXpert MRSA Assay (FDA approved for NASAL specimens only), is one component of a comprehensive MRSA colonization surveillance program. It is not intended to diagnose MRSA infection nor to guide or monitor treatment for MRSA infections. Test performance is not FDA approved in patients less than 58 years old. Performed at [facility], [address], [place], [place] [address]   Aerobic/Anaerobic Culture w Gram Stain (surgical/deep wound)     Status: None   Collection Time: 10/29/22  6:33 PM   Specimen: Path fluid; Body Fluid  Result Value Ref Range Status   Specimen Description FLUID ABDOMEN  Final   Special Requests SWAB ID A  Final   Gram Stain   Final    NO WBC SEEN FEW GRAM NEGATIVE RODS RARE GRAM POSITIVE RODS RARE GRAM POSITIVE COCCI Performed at [facility], [address] [place], [place] [address]    Culture   Final    MODERATE STREPTOCOCCUS GROUP G Beta hemolytic streptococci are predictably susceptible to penicillin and other beta lactams. Susceptibility testing not routinely performed. MODERATE ACTINOMYCES SPECIES Standardized susceptibility testing for this organism is not available. ABUNDANT BACTEROIDES OVATUS BETA LACTAMASE POSITIVE MODERATE CANDIDA ALBICANS    Report Status 11/02/2022 FINAL  Final  Culture, blood (Routine X 2) w Reflex to ID Panel     Status: None (Preliminary result)   Collection Time: 11/03/22 10:18 AM   Specimen: BLOOD LEFT ARM  Result Value Ref Range Status   Specimen Description   Final    BLOOD LEFT ARM Performed at [facility], [address], [place], [place] [address]    Special Requests   Final    BOTTLES DRAWN AEROBIC ONLY Blood  Culture adequate volume Performed at [facility], [address] [place], [place] [address]    Culture   Final    NO GROWTH 4 DAYS Performed at [facility], [address] [place], [place] [address]    Report Status PENDING  Incomplete  Culture, blood (Routine X 2) w Reflex to ID Panel     Status: None (Preliminary result)   Collection Time: 11/03/22 10:20 AM   Specimen: BLOOD LEFT HAND  Result Value Ref Range Status   Specimen Description   Final    BLOOD LEFT HAND Performed at [facility], [address], [place], [place] [address]    Special Requests   Final    BOTTLES DRAWN AEROBIC ONLY Blood Culture results may not be optimal due to an inadequate volume of blood received in culture bottles Performed at [facility], [address] [place], [place] [address]    Culture   Final    NO GROWTH 4 DAYS Performed at [facility], [address], [place], [place] [address]    Report Status PENDING  Incomplete    Pertinent Lab.  Latest Ref Rng & Units 11/07/2022    5:45 AM 11/06/2022    8:15 AM 11/05/2022    9:39 AM  CBC  WBC 4.0 - 10.5 K/uL 20.4  21.4  23.2   Hemoglobin 12.0 - 15.0 g/dL 8.0  8.1  16.1   Hematocrit 36.0 - 46.0 % 28.0  28.7  36.0   Platelets 150 - 400 K/uL 446  436  365       Latest Ref Rng & Units 11/08/2022    4:49 AM 11/07/2022    5:45 AM 11/06/2022    6:30 AM  CMP  Glucose 70 - 99 mg/dL 096  045  409   BUN 6 - 20 mg/dL 22  29  31    Creatinine 0.44 - 1.00 mg/dL 8.11  9.14  7.82   Sodium 135 - 145 mmol/L 142  148  147   Potassium 3.5 - 5.1 mmol/L 4.0  4.2  4.2   Chloride 98 - 111 mmol/L 113  113  108   CO2 22 - 32 mmol/L 25  25  26    Calcium 8.9 - 10.3 mg/dL 7.5  7.7  7.4   Total Protein 6.5 - 8.1 g/dL   5.3   Total Bilirubin 0.3 - 1.2 mg/dL   0.3   Alkaline Phos 38 - 126 U/L   53   AST 15 - 41 U/L   19   ALT 0 - 44 U/L   14      Pertinent Imaging today Plain films and CT  images have been personally visualized and interpreted; radiology reports have been reviewed. Decision making incorporated into the Impression /   No results found.  I have personally spent 50 minutes involved in face-to-face and non-face-to-face activities for this patient on the day of the visit. Professional time spent includes the following activities: Preparing to see the patient (review of tests), Obtaining and/or reviewing separately obtained history (admission/discharge record), Performing a medically appropriate examination and/or evaluation , Ordering medications/tests/procedures, referring and communicating with other health care professionals, Documenting clinical information in the EMR, Independently interpreting results (not separately reported), Communicating results to the patient/family/caregiver, Counseling and educating the patient/family/caregiver and Care coordination (not separately reported).   Plan d/w requesting provider as well as ID pharm D  Note: This document was prepared using dragon voice recognition software and may include unintentional dictation errors.   Electronically signed by:   Odette Fraction, MD Infectious Disease Physician [facility] Pager: [phone]

## 2022-11-08 NOTE — Progress Notes (Signed)
PHARMACY - TOTAL PARENTERAL NUTRITION CONSULT NOTE   Indication: Prolonged ileus  Patient Measurements: Height: 5\' 5"  (165.1 cm) Weight: 110.2 kg (242 lb 15.2 oz) IBW/kg (Calculated) : 57 TPN AdjBW (KG): 71.4 Body mass index is 40.43 kg/m.  Assessment:  Pharmacy consulted to start TPN on a 57 yo pt with complicated robotic recurrent incisional hernia repair with mesh, bilateral posterior rectus myofascial release, bilateral transversus abdominis release and removal of previous polypropylene mesh with prolonged lyses of adhesions on 10/23/22. This was complicated by a bowel perforation requiring return to OR on 6/16, 6/18, 6/20, and 6/22.   Glucose / Insulin:  -A1c on 6/3 is 8.5 and takes PTA Metformin -Pt on mSSI q4h and D5 for hypernatremia -CBGs have ranged 152-162 in past 24 hours (target <150) -Previous 24 hour insulin use: 14 units  Electrolytes: Na improved to 142, Cl remains elevated at 113. corrected Ca 9.42 Renal: SCr stable, BUN slightly elevated Hepatic: WNL, albumin remains low -TG 406 (on 6/24) - lipids out of TPN on 6/24 Intake / Output; MIVF:  -UOP 2.6 L -Drains 390 mL -Emesis/NG 250 mL GI Imaging: NA GI Surgeries / Procedures:  - 6/10 Robotic incisional hernia repair - 6/16 exploratory laparotomy, repair of small bowel perforation  - 6/18 exploratory laparotomy, wash out of abdomen and wound vac placement  - 6/20 exploratory laparotomy, wound wash out of abdomen and wound vac change  - 6/22 absorbable mesh closure of abdomen, application of wound vac   Central access: PICC  TPN start date: 11/01/22  Nutritional Goals: Goal TPN rate is 100 mL/hr  RD Assessment: Estimated Needs Total Energy Estimated Needs: 1900-2200 kcals Total Protein Estimated Needs: 115-145 grams Total Fluid Estimated Needs: >/= 1.9L  Current Nutrition:  NPO Per surgery, Continue NPO until BM, still high risk for fistula formation. Gastric tube to low intermittent wall  suction.  Plan:  Continue TPN at 80 mL/hr - Keep lipids out of TPN for now. Protein at 130 g and dextrose at 307 g - total kcal 1566 which is approximately 80% of needs. Follow up re-estimation of needs if able to be extubated today.   - Electrolytes in TPN: Na 0 mEq/L, K 60 mEq/L, Ca 5 mEq/L, Mg 2 mEq/L, Phos 10 mmol/L. Cl:Ac max Ac - Add standard MVI and trace elements to TPN - Continue Moderate q4h SSI and adjust as needed  - IVF: D5 per MD x24 hrs, now off.  - Monitor TPN labs on Mon/Thurs at minimum.  Recheck TG Thursday.   Thank you for allowing pharmacy to be a part of this patient's care.  Lynann Beaver PharmD, BCPS [facility] main pharmacy [phone] 11/08/2022 7:36 AM

## 2022-11-09 DIAGNOSIS — K659 Peritonitis, unspecified: Secondary | ICD-10-CM | POA: Diagnosis not present

## 2022-11-09 DIAGNOSIS — G8918 Other acute postprocedural pain: Secondary | ICD-10-CM

## 2022-11-09 DIAGNOSIS — B377 Candidal sepsis: Secondary | ICD-10-CM

## 2022-11-09 LAB — MAGNESIUM: Magnesium: 2.2 mg/dL (ref 1.7–2.4)

## 2022-11-09 LAB — GLUCOSE, CAPILLARY
Glucose-Capillary: 158 mg/dL — ABNORMAL HIGH (ref 70–99)
Glucose-Capillary: 133 mg/dL — ABNORMAL HIGH (ref 70–99)
Glucose-Capillary: 144 mg/dL — ABNORMAL HIGH (ref 70–99)
Glucose-Capillary: 150 mg/dL — ABNORMAL HIGH (ref 70–99)
Glucose-Capillary: 169 mg/dL — ABNORMAL HIGH (ref 70–99)
Glucose-Capillary: 180 mg/dL — ABNORMAL HIGH (ref 70–99)
Glucose-Capillary: 163 mg/dL — ABNORMAL HIGH (ref 70–99)
Glucose-Capillary: 152 mg/dL — ABNORMAL HIGH (ref 70–99)

## 2022-11-09 LAB — CBC
HCT: 28.8 % — ABNORMAL LOW (ref 36.0–46.0)
Hemoglobin: 8.6 g/dL — ABNORMAL LOW (ref 12.0–15.0)
MCH: 27.5 pg (ref 26.0–34.0)
MCHC: 29.9 g/dL — ABNORMAL LOW (ref 30.0–36.0)
MCV: 92 fL (ref 80.0–100.0)
Platelets: 458 10*3/uL — ABNORMAL HIGH (ref 150–400)
RBC: 3.13 MIL/uL — ABNORMAL LOW (ref 3.87–5.11)
RDW: 14.6 % (ref 11.5–15.5)
WBC: 19.5 10*3/uL — ABNORMAL HIGH (ref 4.0–10.5)
nRBC: 0.2 % (ref 0.0–0.2)

## 2022-11-09 LAB — COMPREHENSIVE METABOLIC PANEL
ALT: 18 U/L (ref 0–44)
AST: 26 U/L (ref 15–41)
Albumin: 1.8 g/dL — ABNORMAL LOW (ref 3.5–5.0)
Alkaline Phosphatase: 77 U/L (ref 38–126)
Anion gap: 8 (ref 5–15)
BUN: 18 mg/dL (ref 6–20)
CO2: 27 mmol/L (ref 22–32)
Calcium: 8.1 mg/dL — ABNORMAL LOW (ref 8.9–10.3)
Chloride: 103 mmol/L (ref 98–111)
Creatinine, Ser: 0.47 mg/dL (ref 0.44–1.00)
GFR, Estimated: >60 mL/min (ref >60)
Glucose, Bld: 144 mg/dL — ABNORMAL HIGH (ref 70–99)
Potassium: 3.9 mmol/L (ref 3.5–5.1)
Sodium: 138 mmol/L (ref 135–145)
Total Bilirubin: 0.4 mg/dL (ref 0.3–1.2)
Total Protein: 6.1 g/dL — ABNORMAL LOW (ref 6.5–8.1)

## 2022-11-09 LAB — PHOSPHORUS: Phosphorus: 2 mg/dL — ABNORMAL LOW (ref 2.5–4.6)

## 2022-11-09 LAB — TRIGLYCERIDES: Triglycerides: 388 mg/dL — ABNORMAL HIGH (ref <150)

## 2022-11-09 MED ORDER — POTASSIUM PHOSPHATES 15 MMOLE/5ML IV SOLN
20.0000 mmol | Freq: Once | INTRAVENOUS | Status: AC
  Administered 2022-11-09: 20 mmol via INTRAVENOUS
  Filled 2022-11-09: qty 6.67

## 2022-11-09 MED ORDER — TRAVASOL 10 % IV SOLN
INTRAVENOUS | Status: AC
  Filled 2022-11-09: qty 1440

## 2022-11-09 MED ORDER — SALINE SPRAY 0.65 % NA SOLN
1.0000 | NASAL | Status: DC | PRN
  Administered 2022-11-09: 1 via NASAL
  Filled 2022-11-09: qty 44

## 2022-11-09 NOTE — Progress Notes (Signed)
NAME:  Kaitlyn Bray, MRN:  [id number], DOB:  July 02, 1965, LOS: 17 ADMISSION DATE:  10/23/2022, CONSULTATION DATE:  6/16 REFERRING MD:  Cornett- surgery, CHIEF COMPLAINT:  post-op vent   History of Present Illness:  [name] is a 57 y/o woman with a history of a ventral hernia with recent robotic incisional hernia repair on 6/10 who had been progressing well until she developed fever and drainage of foul-smelling fluid from her lower abdominal incision. She returned to the OR for exploratory ex-lap 6/16, found to have bowel contents throughout her abdomen. She required excision of the mesh and identification of her site of perforation, which was repaired then resected with end-to-end anastamosis. Her abdomen was unable to be closed and despite extensive irrigation, she will require additional washout. She had cultures collected intraoperatively. Zosyn was given in the OR.  She was brought to the ICU intubated, sedated, paralyzed post-op. She will remain intubated until she is able to return to the OR to be closed.   She has a complicated abdominal surgery history with previous hysterectomy, multiple incisional hernias in the past. She has had post-op ileus complicating several previous surgeries.   Pertinent  Medical History  Obesity Diabetes Hypertension  Significant Hospital Events: Including procedures, antibiotic start and stop dates in addition to other pertinent events   6/10 hernia repair with mesh 6/14 Urine output decreased overnight 10  6/16 ex lap resection of small bowel perf. Gross contamination of space. Left open and intubated. Pccm consult   6/18 ex lap washout. Significantly dilated bowel, turbid fluid. Left open  6/19 started TPN 6/20 plan for OR today for washout  6/21 adding fluc. ID consult  6/22 to return the OR, abdomen closed, large wound VAC in place 6/24 continued Vent weaning, continued ABX, wean sedation as able. CXR repeat shows mild R atelectasis, Increasing  PEEP to 8 and attempting to wean down/shut off versed gtt in preparation for extubation when able 6/26 extubated, fent PCA   6/27 having BMs  Interim History / Subjective:  Having Bms, is incontinent of stool  Pain is pretty well controlled w fent PCA   Objective   Blood pressure (!) 152/75, pulse 83, temperature 99.2 F (37.3 C), temperature source Rectal, resp. rate 20, height 5\' 5"  (1.651 m), weight 110.2 kg, SpO2 100 %. CVP:  [10 mmHg-12 mmHg] 10 mmHg  FiO2 (%):  [28 %] 28 %   Intake/Output Summary (Last 24 hours) at 11/09/2022 0824 Last data filed at 11/09/2022 0600 Gross per 24 hour  Intake 1898.39 ml  Output 4035 ml  Net -2136.61 ml   Filed Weights   11/05/22 0705 11/07/22 0500 11/08/22 0500  Weight: 107.2 kg 105.8 kg 110.2 kg   Examination: General:  Ill appearing middle aged F NAD  HEENT: NCAT pink mm anicteric sclera NGT in place Neuro: Drowsy but Ox4 following commands. Some generalized wkness, no focal def  CV: rrr cap refill < 3 sec  PULM:  CTAb, shallow respirations   GI: Midline surgical site with wound vac. Abd w generalized tenderness. Hypoactive bowel sounds  Extremities: cool to touch, no acute joint deformity. RUE PICC  Skin: pale c/d   Resolved Hospital Problem list   Hyperkalemia,  Hyponatremia Shock Endotracheally intubated   Assessment & Plan:   S/p complex robotic hernia repair c/b small bowel perf  Septic shock (improved shock) due to polymicrobial peritonitis in setting of above Post-op pain  -hernia repair 6/10, ex lap SB resection 6/16, ex lap  washout 6/18, ex lap washout 6/20, washout closure 6/22, vac change 6/24  -abd fluid: moderate group G strep, moderate actinomyces, abundant bacteroides ovatus beta lactamase pos, moderate candida  P -post op per CCS -- likely vac change 6/27  -starting to have BMS -- might be at a point where we can trial trickle EN. Until able to d/w CCS, cont NPO and TPN  -zosyn fluc -- anticipate prolonged  antimicrobial course  -ID following  -repeat CT a/p in a few days (target 6/27-6/29) to eval for possible remaining fluid collection -fent PCA   DM w hyperglycemia  P -SSI   Anemia  Thrombocytosis -hgb fairly stable, in setting of critical illness and iatrogenic losses. Did not have significant op losses  -thrombocytosis is likely reactive  P -follow CBC   Physical deconditioning  P -PT/OT -Delirium precautions   Stool incontinence P -FMS or pouch   L/T/D -maintain PICC -- req TPN -maintain NGT -adding FMS vs pouch -cont foley for now w stool incontinence. Hope is w FMS to change to purewick   Best Practice (right click and "Reselect all SmartList Selections" daily)   Diet/type: NPO;  TPN DVT prophylaxis: LMWH GI prophylaxis: PPI Lines: Central line/ PICC RUE Foley:  Yes, and it is still needed Code Status:  full code Last date of multidisciplinary goals of care discussion- pt updated daily  CRITICAL CARE Performed by: Lanier Clam   Total critical care time: 38 minutes  Critical care time was exclusive of separately billable procedures and treating other patients. Critical care was necessary to treat or prevent imminent or life-threatening deterioration.  Critical care was time spent personally by me on the following activities: development of treatment plan with patient and/or surrogate as well as nursing, discussions with consultants, evaluation of patient's response to treatment, examination of patient, obtaining history from patient or surrogate, ordering and performing treatments and interventions, ordering and review of laboratory studies, ordering and review of radiographic studies, pulse oximetry and re-evaluation of patient's condition.  Tessie Fass MSN, AGACNP-BC [facility] Pulmonary/Critical Care Medicine Amion for pager 11/09/2022, 8:24 AM

## 2022-11-09 NOTE — Progress Notes (Signed)
   11/09/22 1700  Pre-Screen Questions- If "YES" to any of the following questions, STOP the screen, keep NPO, and place order for SLP eval and treat   Home diet required thickened liquids No  Trach tube present No  Radiation to Head/Neck No  Patient Readiness for Screen - If "YES" to any of the following questions, WAIT to screen, keep NPO  Is patient lethargic or unable to stay alert/awake? No  HOB restricted to <30 degrees No  NPO for planned procedure No  Brief Cognitive Screen- Aspiration Risk Assessment  Is patient oriented to name, place, or year? Yes  Able to open mouth, stick out tongue, or smile Yes  Oral Mechanism Exam  Able to seal lips Yes  Able to move tongue from side to side Yes  Face is symmetric Yes  Mandatory Oral Care Performed   Mandatory oral care performed Yes  3 oz Water Swallow Challenge  Does the patient stop drinking? (!) Yes  Does the patient cough/choke? No  Screening Result (!) Failed   Dr. Delton Coombes aware of results. SLP consult planned for tomorrow. Pt to stay NPO this evening.

## 2022-11-09 NOTE — Progress Notes (Signed)
OT Cancellation Note  Patient Details Name: Kaitlyn Bray MRN: [id number] DOB: [birth year]   Cancelled Treatment:    Reason Eval/Treat Not Completed: Medical issues which prohibited therapy (pt with frequent loose stools and issues with flexiseal). Will continue to follow.   Evern Bio 11/09/2022, 12:37 PM Berna Spare, OTR/L Acute Rehabilitation Services Office: [phone]

## 2022-11-09 NOTE — Progress Notes (Signed)
PT Cancellation Note  Patient Details Name: Kaitlyn Bray MRN: [id number] DOB: 09/24/1965   Cancelled Treatment:    Reason Eval/Treat Not Completed: Medical issues which prohibited therapy, issues with BM/Flexiseal. Check back another time. Blanchard Kelch PT Acute Rehabilitation Services Office [phone] Weekend pager-[phone]    Rada Hay 11/09/2022, 10:05 AM

## 2022-11-09 NOTE — Progress Notes (Addendum)
PHARMACY - TOTAL PARENTERAL NUTRITION CONSULT NOTE   Indication: Prolonged ileus  Patient Measurements: Height: 5\' 5"  (165.1 cm) Weight: 110.2 kg (242 lb 15.2 oz) IBW/kg (Calculated) : 57 TPN AdjBW (KG): 71.4 Body mass index is 40.43 kg/m.  Assessment:  Pharmacy consulted to start TPN on a 57 yo pt with complicated robotic recurrent incisional hernia repair with mesh, bilateral posterior rectus myofascial release, bilateral transversus abdominis release and removal of previous polypropylene mesh with prolonged lyses of adhesions on 10/23/22. This was complicated by a bowel perforation requiring return to OR on 6/16, 6/18, 6/20, and 6/22.   Glucose / Insulin:  - A1c 8.5 (6/3) and takes PTA Metformin - Pt on mSSI q4h; Previous 24 hour insulin use: 13 units  - CBGs have ranged 127-158 in past 24 hours (target <150) Electrolytes: Phos low at 2.  Other WNL including Mag, Na down to 138 (off D5, Na removed from TPN), Cl/CO2 ratio WNL, corrected Ca 9.86 Renal: SCr stable, BUN WNL Hepatic: WNL, albumin remains low -TG 406 (6/24) - lipids out of TPN on 6/24 - TG 388 (6/27) remain significantly elevated but improved.  Continue to hold lipids Intake / Output; MIVF: no mIVF - UOP 3.1 L - Drains 410 mL - Emesis/NG 475 mL - Stool: x4 occurrence GI Imaging: NA GI Surgeries / Procedures:  - 6/10 Robotic incisional hernia repair - 6/16 exploratory laparotomy, repair of small bowel perforation  - 6/18 exploratory laparotomy, wash out of abdomen and wound vac placement  - 6/20 exploratory laparotomy, wound wash out of abdomen and wound vac change  - 6/22 absorbable mesh closure of abdomen, application of wound vac   Central access: PICC  TPN start date: 11/01/22  Nutritional Goals: TPN rate is 100 mL/hr - meeting protein and kcal goals without lipids   kcal %  Protein 60 g/L, 144g 576 29.34  Dextrose 17%, 408g 1,387.2 70.66  Lipids  -- --  Total  1,963.2     RD Assessment: Estimated  Needs Total Energy Estimated Needs: 1900-2200 kcals Total Protein Estimated Needs: 115-145 grams Total Fluid Estimated Needs: >/= 1.9L  Current Nutrition:  NPO Per surgery, Continue NPO until BM, still high risk for fistula formation. Gastric tube to low intermittent wall suction.  Plan:  Now: KPhos 20 mmol IV x1 At 18:00 Increase to TPN at 100 mL/hr - Continue NO lipids in TPN.  When TG > 400 mg/dL, IV fat emulsion should be limited to the provision of essential fatty acids once weekly  - Electrolytes in TPN: Na 20 mEq/L, K 60 mEq/L, Ca 5 mEq/L, Mg 2 mEq/L, Phos 15 mmol/L. Cl:Ac 1:1 - Add standard MVI and trace elements to TPN - Continue Moderate q4h SSI and adjust as needed  - Monitor TPN labs on Mon/Thurs at minimum. Recheck TG on 7/1 - Follow up plans for enteral feedings now that patient is having multiple BMs.     Thank you for allowing pharmacy to be a part of this patient's care.  Lynann Beaver PharmD, BCPS [facility] main pharmacy [phone] 11/09/2022 7:50 AM

## 2022-11-09 NOTE — Progress Notes (Signed)
5 Days Post-Op   Subjective/Chief Complaint: Extubated!  Stool around rectal tube.  Vac and drains no change  Objective: Vital signs in last 24 hours: Temp:  [98.3 F (36.8 C)-99.6 F (37.6 C)] 98.8 F (37.1 C) (06/27 1133) Pulse Rate:  [74-87] 83 (06/27 0600) Resp:  [14-32] 26 (06/27 1214) BP: (149-175)/(67-91) 152/75 (06/27 0600) SpO2:  [98 %-100 %] 100 % (06/27 1214) FiO2 (%):  [28 %] 28 % (06/27 1214) Last BM Date : 11/09/22  Intake/Output from previous day: 06/26 0701 - 06/27 0700 In: 2151.9 [I.V.:1901.8; IV Piggyback:250.1] Out: 4035 [Urine:3150; Emesis/NG output:475; Drains:410] Intake/Output this shift: Total I/O In: 20 [I.V.:20] Out: 600 [Urine:600]  Exam: Intubated and sedated Abdomen soft, Wound vac changed at bedside - three pieces of white foam with one piece of black foam  Lab Results:  Recent Labs    11/07/22 0545 11/09/22 0613  WBC 20.4* 19.5*  HGB 8.0* 8.6*  HCT 28.0* 28.8*  PLT 446* 458*    BMET Recent Labs    11/08/22 0449 11/09/22 0613  NA 142 138  K 4.0 3.9  CL 113* 103  CO2 25 27  GLUCOSE 138* 144*  BUN 22* 18  CREATININE 0.57 0.47  CALCIUM 7.5* 8.1*    PT/INR No results for input(s): "LABPROT", "INR" in the last 72 hours. ABG No results for input(s): "PHART", "HCO3" in the last 72 hours.  Invalid input(s): "PCO2", "PO2"  Studies/Results: No results found.  Anti-infectives: Anti-infectives (From admission, onward)    Start     Dose/Rate Route Frequency Ordered Stop   11/05/22 1215  vancomycin (VANCOREADY) IVPB 1750 mg/350 mL  Status:  Discontinued        1,750 mg 175 mL/hr over 120 Minutes Intravenous Every 24 hours 11/04/22 1125 11/08/22 1023   11/04/22 1215  vancomycin (VANCOREADY) IVPB 2000 mg/400 mL        2,000 mg 200 mL/hr over 120 Minutes Intravenous STAT 11/04/22 1125 11/04/22 1402   11/04/22 1000  fluconazole (DIFLUCAN) IVPB 400 mg        400 mg 100 mL/hr over 120 Minutes Intravenous Every 24 hours 11/03/22  0801     11/03/22 0845  fluconazole (DIFLUCAN) IVPB 800 mg        800 mg 100 mL/hr over 240 Minutes Intravenous  Once 11/03/22 0759 11/03/22 1327   10/28/22 1000  azithromycin (ZITHROMAX) tablet 250 mg  Status:  Discontinued       See Hyperspace for full Linked Orders Report.   250 mg Oral Daily 10/27/22 0738 10/27/22 1653   10/27/22 1700  piperacillin-tazobactam (ZOSYN) IVPB 3.375 g        3.375 g 12.5 mL/hr over 240 Minutes Intravenous Every 8 hours 10/27/22 1653     10/27/22 1000  azithromycin (ZITHROMAX) tablet 500 mg       See Hyperspace for full Linked Orders Report.   500 mg Oral Daily 10/27/22 0738 10/27/22 0946   10/23/22 1054  ceFAZolin (ANCEF) 2-4 GM/100ML-% IVPB       Note to Pharmacy: Augustine Radar R: cabinet override      10/23/22 1054 10/23/22 2259   10/23/22 0600  ceFAZolin (ANCEF) IVPB 3g/100 mL premix        3 g 200 mL/hr over 30 Minutes Intravenous On call to O.R. 10/23/22 0540 10/23/22 1213       Assessment/Plan: Ms. Picazo underwent difficult robotic recurrent incisional hernia repair with mesh, bilateral posterior rectus myofascial release, bilateral transversus abdominis release and removal of  previous polypropylene mesh with prolonged lyses of adhesions on 10/23/22. This was complicated by bowel perforation, possible missed enterotomy during dissection of small intestine off mesh, requiring return to the operating room with washout, small bowel resection and ABTHERA placement on 10/29/22.  Returned to the OR for wash out 6/18 and 6/20.  Underwent bridging retromuscular Phasix mesh closure of the abdomen 11/04/22.  ID consult for polymicrobial infection - continue antibiotics Fevers may be related to phasix and vicryl mesh placed in a contaminated field.  Will need long term antibiotics.  Plan for CT scan 5-7 days post op to evaluate for undrained fluid collection if fevers continue but vac should be draining retromuscular space and hopefully Jps draining  intra-abdominal space Vac changed today 6/27 with 3 pieces of white foam and 1 piece of black foam.  Holding seal.  Next vac change per wound ostomy team on Monday Clamp NG and try clear liquids, remove NG after 6 hours if no symptoms Remove rectal tube and remove foley and get patient up to bedside commode frequently  Appreciate CCM and ID involvement  Possibly out of ICU tomorrow if more mobile, tolerating diet, up to bathroom, etc.  PT/OT  [name], MD  11/09/2022

## 2022-11-09 NOTE — Progress Notes (Signed)
Nutrition Follow-up  DOCUMENTATION CODES:   Morbid obesity  INTERVENTION:  - Plan to continue TPN without lipids at this time due to elevated triglycerides: TPN at 16mL/hr and provides 144g protein and 408g dextrose = 1963kcal/day  - TPN management per pharmacy.   - Monitor triglycerides for ability to add lipids back to TPN.  - Clear Liquid diet. Will monitor for further diet progression.  - Consider nutrition supplements (Boost Breeze vs Ensure Plus High Protein) once medically appropriate.   - Daily weights while on TPN   NUTRITION DIAGNOSIS:   Inadequate oral intake related to inability to eat as evidenced by NPO status. *ongoing  GOAL:   Patient will meet greater than or equal to 90% of their needs *progressing, on TPN  MONITOR:   Vent status, Labs, Weight trends  REASON FOR ASSESSMENT:   Consult New TPN/TNA  ASSESSMENT:   57 y.o. female with PMH HTN and Diabetes who presented for robotic hernia repair now complicated by bowel perforation.  6/10 Admit, s/p hernia repair with lysis of adhesions 6/16 patient went to bathroom and had large amount of foul smelling fluid gush from abdominal wall; taken to surgery emergently -> found to have small bowel injury with perforation and contamination of abdominal cavity s/p ex-lap, resection of small bowel perf, ABThera wound VAC placed with open abdomen 6/18 ex-lap, washout of abdomen, ABThera wound VAC placement 6/20 repeat ex-lap, washout of abdomen, ABThera wound VAC placement 6/22 closure of abdomen, wound vac to incision 6/26 Extubated  Patient awake and alert at time of visit this AM. Noted to be a little confused.   Discussed that patient remains on TPN although continues to not receive lipids due to elevated triglycerides. Patient endorsed understanding. States she has been having a lot of bowel movements. Asking when she would be able to drink something.  Discussed this would be dependent on how she progressed  and would be up to Surgery and CCM MD's.   Patient then asking about what happened to her since admit. Per RN who presented to bedside, patient continues to ask the same questions over and over and has been a little confused.   After visit, diet advanced to clear liquids per Surgery. Plan to clamp NGT and remove if no symptoms.  Patient discussed with pharmacy. Plan to continue to keep lipids out of TPN until triglycerides decrease.     Medications reviewed and include: Fentanyl, Miralax  Labs reviewed:  Phosphorus 2.0 HA1C 8.5 Blood Glucose 127-158 x24 hours   Latest Reference Range & Units 10/30/22 08:13 11/06/22 06:30 11/09/22 06:13  Triglycerides <150 mg/dL 914 (H) 782 (H) 956 (H)  (H): Data is abnormally high  Diet Order:   Diet Order             Diet clear liquid Room service appropriate? Yes; Fluid consistency: Thin  Diet effective now                   EDUCATION NEEDS:  Not appropriate for education at this time  Skin:  Skin Assessment: Reviewed RN Assessment Skin Integrity Issues:: Incisions Incisions: Abdomen  Last BM:  6/27 - rectal tube  Height:  Ht Readings from Last 1 Encounters:  11/07/22 5\' 5"  (1.651 m)   Weight:  Wt Readings from Last 1 Encounters:  11/08/22 110.2 kg   Ideal Body Weight:  56.82 kg  BMI:  Body mass index is 40.43 kg/m.  Estimated Nutritional Needs:  Kcal:  1900-2200 kcals Protein:  115-145  grams Fluid:  >/= 1.9L    Shelle Iron RD, LDN For contact information, refer to [facility].

## 2022-11-09 NOTE — Consult Note (Signed)
WOC Nurse Consult Note: Dr. Dossie Der to change NPWT today. Next change to be by [facility] Nursing team on Monday, July 1. Changes to be M/W/F thereafter by [facility] Nursing team.   WOC nursing team will follow, and will remain available to this patient, the nursing and medical teams.    Thank you for inviting [place] to participate in this patient's Plan of Care.  Ladona Mow, MSN, RN, CNS, GNP, Leda Min, [employer], [employer] phone:  [phone]

## 2022-11-10 DIAGNOSIS — R5381 Other malaise: Secondary | ICD-10-CM

## 2022-11-10 DIAGNOSIS — K432 Incisional hernia without obstruction or gangrene: Secondary | ICD-10-CM | POA: Diagnosis not present

## 2022-11-10 LAB — GLUCOSE, CAPILLARY
Glucose-Capillary: 163 mg/dL — ABNORMAL HIGH (ref 70–99)
Glucose-Capillary: 173 mg/dL — ABNORMAL HIGH (ref 70–99)
Glucose-Capillary: 173 mg/dL — ABNORMAL HIGH (ref 70–99)
Glucose-Capillary: 105 mg/dL — ABNORMAL HIGH (ref 70–99)
Glucose-Capillary: 178 mg/dL — ABNORMAL HIGH (ref 70–99)
Glucose-Capillary: 144 mg/dL — ABNORMAL HIGH (ref 70–99)

## 2022-11-10 LAB — BASIC METABOLIC PANEL
Anion gap: 9 (ref 5–15)
BUN: 18 mg/dL (ref 6–20)
CO2: 26 mmol/L (ref 22–32)
Calcium: 8.3 mg/dL — ABNORMAL LOW (ref 8.9–10.3)
Chloride: 104 mmol/L (ref 98–111)
Creatinine, Ser: 0.52 mg/dL (ref 0.44–1.00)
GFR, Estimated: >60 mL/min (ref >60)
Glucose, Bld: 161 mg/dL — ABNORMAL HIGH (ref 70–99)
Potassium: 4.1 mmol/L (ref 3.5–5.1)
Sodium: 139 mmol/L (ref 135–145)

## 2022-11-10 LAB — MAGNESIUM: Magnesium: 2.2 mg/dL (ref 1.7–2.4)

## 2022-11-10 LAB — PHOSPHORUS: Phosphorus: 2.8 mg/dL (ref 2.5–4.6)

## 2022-11-10 MED ORDER — OXYCODONE HCL 5 MG PO TABS
5.0000 mg | ORAL_TABLET | ORAL | Status: DC | PRN
  Administered 2022-11-11: 5 mg via ORAL
  Filled 2022-11-10: qty 1

## 2022-11-10 MED ORDER — HYDROMORPHONE HCL 1 MG/ML IJ SOLN
0.5000 mg | INTRAMUSCULAR | Status: DC | PRN
  Administered 2022-11-10: 1 mg via INTRAVENOUS
  Filled 2022-11-10: qty 1

## 2022-11-10 MED ORDER — TRAVASOL 10 % IV SOLN
INTRAVENOUS | Status: AC
  Filled 2022-11-10: qty 1440

## 2022-11-10 MED ORDER — METHOCARBAMOL 1000 MG/10ML IJ SOLN: 500.0000 mg | Freq: Four times a day (QID) | INTRAVENOUS | Status: DC | PRN

## 2022-11-10 MED ORDER — TRAVASOL 10 % IV SOLN: INTRAVENOUS | Status: DC

## 2022-11-10 MED ORDER — OXYCODONE HCL 5 MG PO TABS: 10.0000 mg | ORAL_TABLET | ORAL | Status: DC | PRN

## 2022-11-10 MED ORDER — GABAPENTIN 300 MG PO CAPS
300.0000 mg | ORAL_CAPSULE | Freq: Three times a day (TID) | ORAL | Status: DC
  Administered 2022-11-10 (×3): 300 mg via ORAL
  Filled 2022-11-10 (×4): qty 1

## 2022-11-10 MED ORDER — ACETAMINOPHEN 325 MG PO TABS
650.0000 mg | ORAL_TABLET | Freq: Four times a day (QID) | ORAL | Status: DC
  Administered 2022-11-10 – 2022-11-11 (×3): 650 mg via ORAL
  Filled 2022-11-10 (×3): qty 2

## 2022-11-10 NOTE — Progress Notes (Signed)
PHARMACY - TOTAL PARENTERAL NUTRITION CONSULT NOTE   Indication: Prolonged ileus  Patient Measurements: Height: 5\' 5"  (165.1 cm) Weight: 107.2 kg (236 lb 5.3 oz) IBW/kg (Calculated) : 57 TPN AdjBW (KG): 71.4 Body mass index is 39.33 kg/m.  Assessment:  Pharmacy consulted to start TPN on a 57 yo pt with complicated robotic recurrent incisional hernia repair with mesh, bilateral posterior rectus myofascial release, bilateral transversus abdominis release and removal of previous polypropylene mesh with prolonged lyses of adhesions on 10/23/22. This was complicated by a bowel perforation requiring return to OR on 6/16, 6/18, 6/20, and 6/22.   Glucose / Insulin:  - A1c 8.5 (6/3) and takes PTA Metformin - Pt on mSSI q4h; Previous 24 hour insulin use: 17 units  - CBGs have ranged 150-180 in past 24 hours (target <150) Electrolytes:  lytes WNL, corrected Ca 10.06 Renal: SCr stable, BUN WNL Hepatic: WNL, albumin remains low -TG 406 (6/24) - lipids out of TPN on 6/24 - TG 388 (6/27) remain significantly elevated but improved.  Continue to hold lipids Intake / Output; MIVF: no mIVF From 6/27 7am to 6/28 7am: - UOP + 10x unmeasured urine occurrences - Drains 50 mL - Stool: x3 occurrence - NG removed  GI Imaging: NA GI Surgeries / Procedures:  - 6/10 Robotic incisional hernia repair - 6/16 exploratory laparotomy, repair of small bowel perforation  - 6/18 exploratory laparotomy, wash out of abdomen and wound vac placement  - 6/20 exploratory laparotomy, wound wash out of abdomen and wound vac change  - 6/22 absorbable mesh closure of abdomen, application of wound vac   Central access: PICC  TPN start date: 11/01/22  Nutritional Goals: TPN rate is 100 mL/hr - meeting protein and kcal goals without lipids   kcal %  Protein 60 g/L, 144g 576 29.34  Dextrose 17%, 408g 1387.2 70.66  Lipids  -- --  Total  1963.2     RD Assessment: Estimated Needs Total Energy Estimated Needs:  1900-2200 kcals Total Protein Estimated Needs: 115-145 grams Total Fluid Estimated Needs: >/= 1.9L  Current Nutrition:  - 6/27: Clear liquid diet ordered - TPN  Plan:  - Continue TPN at goal rate 100 mL/hr - Continue NO lipids in TPN.  When TG > 400 mg/dL, IV fat emulsion should be limited to the provision of essential fatty acids once weekly  - Electrolytes in TPN:  Na 20 mEq/L  K 42 mEq/L  Ca 4 mEq/L  Mg 1.5 mEq/L  Phos 15 mmol/L  Cl:Ac 1:1 - Reduced K, Ca, Mg in TPN to account for increase made 6/27 in total fluid volume. - Add standard MVI and trace elements to TPN - Continue Moderate q4h SSI and adjust as needed  - Monitor TPN labs on Mon/Thurs at minimum. Recheck TG on 7/1 - Follow up plans for enteral feedings and further diet progression  Thank you for allowing pharmacy to be a part of this patient's care.  Tacy Learn, PharmD Clinical Pharmacist 11/10/2022 7:53 AM

## 2022-11-10 NOTE — Progress Notes (Cosign Needed)
15ml of Fentanyl from PCA pump witnessed by [employer], wasted in steri-cycle.

## 2022-11-10 NOTE — Evaluation (Signed)
Occupational Therapy Evaluation Patient Details Name: Kaitlyn Bray MRN: [id number] DOB: 13-Nov-1965 Today's Date: 11/10/2022   History of Present Illness Patient is a 57 year old female who underwent ventral hernia s/p robotic incisional repair on 6/10 complicated by bowel perforation, sepsis, and peritonitis s/p ex lap on 6/16 and wash out on 6/18, 6/20 with closing of the abdomen on 6/22. Patient was extubated on 6/26. PMH: obesity, diabetes, HTN   Clinical Impression   PTA pt lives at home independently with her husband  and works as a Building surveyor. PTA she was walking @ 1 mile/day. Currently requires max A +2 for functional transfers and Max to total A with ADL tasks due to below listed deficits. Unable to stand bedside and required use of Stedy today to mobilize to chair. Good participation and VSS during session. Patient will benefit from intensive inpatient follow up therapy, >3 hours/day. Pt very appreciative. Acute OT to follow.      Recommendations for follow up therapy are one component of a multi-disciplinary discharge planning process, led by the attending physician.  Recommendations may be updated based on patient status, additional functional criteria and insurance authorization.   Assistance Recommended at Discharge Frequent or constant Supervision/Assistance  Patient can return home with the following  (NA)    Functional Status Assessment  Patient has had a recent decline in their functional status and demonstrates the ability to make significant improvements in function in a reasonable and predictable amount of time.  Equipment Recommendations  BSC/3in1 (RW)    Recommendations for Other Services Rehab consult     Precautions / Restrictions Precautions Precautions: Fall;Other (comment) Precaution Comments: jp drains; abdominal wound vac; abdominal precautions      Mobility Bed Mobility Overal bed mobility: Needs Assistance Bed Mobility: Rolling, Sidelying to  Sit Rolling: Mod assist Sidelying to sit: Mod assist, +2 for physical assistance    Began education on need to roll before sitting for abdominal precautions        Transfers Overall transfer level: Needs assistance   Transfers: Sit to/from Stand, Bed to chair/wheelchair/BSC Sit to Stand: Max assist, +2 safety/equipment, From elevated surface           General transfer comment: attempted to stand form bed with RW however unable to lift buttocks from bed; required use of Stedy      Balance Overall balance assessment: Needs assistance Sitting-balance support: Feet supported Sitting balance-Leahy Scale: Fair       Standing balance-Leahy Scale: Poor                             ADL either performed or assessed with clinical judgement   ADL Overall ADL's : Needs assistance/impaired Eating/Feeding: NPO   Grooming: Minimal assistance;Sitting   Upper Body Bathing: Minimal assistance;Sitting   Lower Body Bathing: Maximal assistance;Sit to/from stand   Upper Body Dressing : Moderate assistance;Sitting   Lower Body Dressing: Total assistance       Toileting- Clothing Manipulation and Hygiene: Total assistance Toileting - Clothing Manipulation Details (indicate cue type and reason): incontinenet BM    Assisted nsg to clean pt use from large liquid incontinent episode Functional mobility during ADLs: Maximal assistance;+2 for physical assistance       Vision Baseline Vision/History: 1 Wears glasses Vision Assessment?: Vision impaired- to be further tested in functional context Additional Comments: strabismus; poor depth perception     Perception     Praxis  Pertinent Vitals/Pain Pain Assessment Pain Assessment: Faces Faces Pain Scale: Hurts a little bit Pain Location: abdomen Pain Descriptors / Indicators: Discomfort Pain Intervention(s): Limited activity within patient's tolerance     Hand Dominance Right   Extremity/Trunk Assessment  Upper Extremity Assessment Upper Extremity Assessment: Generalized weakness (BUE AROM generally WFL)   Lower Extremity Assessment Lower Extremity Assessment: Defer to PT evaluation   Cervical / Trunk Assessment Cervical / Trunk Assessment: Other exceptions Cervical / Trunk Exceptions: abdominal vac; increased body habitus   Communication     Cognition Arousal/Alertness: Awake/alert Behavior During Therapy: Flat affect Overall Cognitive Status: Impaired/Different from baseline Area of Impairment: Attention, Safety/judgement, Awareness                   Current Attention Level: Selective       Awareness: Emergent   General Comments: slow processing     General Comments       Exercises Exercises: Other exercises Other Exercises Other Exercises: incentive spirometer x 10 - ableto pull 700 ml Other Exercises: encouraged general BUE AROM at chair level; has level 1 theraband on bed   Shoulder Instructions      Home Living Family/patient expects to be discharged to:: Private residence Living Arrangements: Spouse/significant other Available Help at Discharge: Family;Available 24 hours/day Type of Home: House Home Access: Ramped entrance     Home Layout: One level     Bathroom Shower/Tub: Tub/shower unit;Curtain   Firefighter: Standard Bathroom Accessibility: Yes How Accessible: Accessible via walker Home Equipment: Rollator (4 wheels);Shower seat          Prior Functioning/Environment Prior Level of Function : Independent/Modified Independent;Driving;Working/employed Scientific laboratory technician)                        OT Problem List: Decreased strength;Decreased range of motion;Decreased activity tolerance;Impaired balance (sitting and/or standing);Decreased safety awareness;Decreased knowledge of use of DME or AE;Cardiopulmonary status limiting activity;Obesity      OT Treatment/Interventions: Self-care/ADL training;Therapeutic exercise;Energy  conservation;DME and/or AE instruction;Therapeutic activities;Balance training;Patient/family education    OT Goals(Current goals can be found in the care plan section) Acute Rehab OT Goals Patient Stated Goal: to get rehab OT Goal Formulation: With patient Time For Goal Achievement: 11/24/22 Potential to Achieve Goals: Good  OT Frequency: Min 1X/week    Co-evaluation PT/OT/SLP Co-Evaluation/Treatment: Yes Reason for Co-Treatment: Complexity of the patient's impairments (multi-system involvement);For patient/therapist safety;To address functional/ADL transfers   OT goals addressed during session: ADL's and self-care      AM-PAC OT "6 Clicks" Daily Activity     Outcome Measure Help from another person eating meals?: Total (NPO) Help from another person taking care of personal grooming?: A Little Help from another person toileting, which includes using toliet, bedpan, or urinal?: Total Help from another person bathing (including washing, rinsing, drying)?: A Lot Help from another person to put on and taking off regular upper body clothing?: A Lot Help from another person to put on and taking off regular lower body clothing?: Total 6 Click Score: 10   End of Session Equipment Utilized During Treatment: Gait belt Nurse Communication: Mobility status;Need for lift equipment  Activity Tolerance: Patient tolerated treatment well Patient left: in chair;with call bell/phone within reach;with chair alarm set  OT Visit Diagnosis: Unsteadiness on feet (R26.81);Other abnormalities of gait and mobility (R26.89);Muscle weakness (generalized) (M62.81)                Time: 1610-9604 OT Time Calculation (min): 39 min  Charges:  OT General Charges $OT Visit: 1 Visit OT Evaluation $OT Eval High Complexity: 1 High  Rannie Craney, OT/L   Acute OT Clinical Specialist Acute Rehabilitation Services Pager [phone] Office [phone]   [facility] 11/10/2022, 11:19 AM

## 2022-11-10 NOTE — Progress Notes (Signed)
Inpatient Rehab Admissions Coordinator Note:   Per therapy recommendations patient was screened for CIR candidacy by Stephania Fragmin, PT. At this time, pt appears to be a potential candidate for CIR. I will place an order for rehab consult for full assessment, per our protocol.  Please contact me any with questions.Estill Dooms, PT, DPT [phone] 11/10/22 1:10 PM

## 2022-11-10 NOTE — TOC Initial Note (Addendum)
Transition of Care [facility]) - Initial/Assessment Note    Patient Details  Name: Kaitlyn Bray MRN: [id number] Date of Birth: 23-Mar-1966  Transition of Care [facility]) CM/SW Contact:    Lavenia Atlas, RN Phone Number: 11/10/2022, 6:12 PM  Clinical Narrative:   Per chart review patient currently in [facility] SDU for recurrent ventral hernia. Recent exploratory ex lap,  with ongoing washout and wound vac. PT recommends acute inpatient rehab, Caitlin w/ CIR is following patient. Unsuccessful call attempt made to patient's spouse. No current discharge needs  TOC will continue to follow for needs.                Expected Discharge Plan: IP Rehab Facility (Acute Inpatietn Rehab) Barriers to Discharge: Continued Medical Work up   Patient Goals and CMS Choice Patient states their goals for this hospitalization and ongoing recovery are:: to feel better CMS Medicare.gov Compare Post Acute Care list provided to:: Patient   Cole ownership interest in [facility].provided to:: Patient    Expected Discharge Plan and Services In-house Referral: NA Discharge Planning Services: CM Consult Post Acute Care Choice: IP Rehab Living arrangements for the past 2 months: Single Family Home                 DME Arranged: N/A DME Agency: NA       HH Arranged: NA HH Agency: NA        Prior Living Arrangements/Services Living arrangements for the past 2 months: Single Family Home Lives with:: Spouse Patient language and need for interpreter reviewed:: Yes Do you feel safe going back to the place where you live?: Yes      Need for Family Participation in Patient Care: No (Comment) Care giver support system in place?: Yes (comment) Current home services: Other (comment) Criminal Activity/Legal Involvement Pertinent to Current Situation/Hospitalization: No - Comment as needed  Activities of Daily Living Home Assistive Devices/Equipment: None ADL Screening (condition at time of  admission) Patient's cognitive ability adequate to safely complete daily activities?: Yes Is the patient deaf or have difficulty hearing?: No Does the patient have difficulty seeing, even when wearing glasses/contacts?: No Does the patient have difficulty concentrating, remembering, or making decisions?: No Patient able to express need for assistance with ADLs?: Yes Does the patient have difficulty dressing or bathing?: No Independently performs ADLs?: Yes (appropriate for developmental age) Does the patient have difficulty walking or climbing stairs?: No Weakness of Legs: None Weakness of Arms/Hands: None  Permission Sought/Granted Permission sought to share information with : Case Manager Permission granted to share information with : Yes, Verbal Permission Granted  Share Information with NAME: Case Manager           Emotional Assessment Appearance:: Appears stated age Attitude/Demeanor/Rapport: Unable to Assess Affect (typically observed): Unable to Assess Orientation: : Oriented to Self, Oriented to Place, Oriented to  Time Alcohol / Substance Use: Not Applicable Psych Involvement: No (comment)  Admission diagnosis:  Recurrent ventral hernia [K43.2] Peritonitis (HCC) [K65.9] Patient Active Problem List   Diagnosis Date Noted   Physical deconditioning 11/10/2022   Post-op pain 11/09/2022   Sepsis due to Candida species (HCC) 11/09/2022   Acute respiratory failure (HCC) 11/08/2022   Endotracheally intubated 11/02/2022   Small bowel perforation (HCC) 11/02/2022   Peritonitis (HCC) 10/29/2022   New onset type 2 diabetes mellitus (HCC) 09/15/2022   Aortic atherosclerosis (HCC) 01/17/2022   Morbid obesity (HCC) 01/17/2022   Other fatigue 01/17/2022   Hypocalcemia 01/17/2022  Polyarthralgia 01/17/2022   Muscle spasm 01/17/2022   Vitamin D deficiency 01/17/2022   Recurrent ventral hernia 09/15/2021   Urinary incontinence 06/23/2019   Constipation 06/23/2019   BPPV  (benign paroxysmal positional vertigo) 03/20/2019   HTN (hypertension) 04/02/2017   Asthma 04/02/2017   PCP:  Mort Sawyers, FNP Pharmacy:   [id number] [place], [place] - [address] [place] [address] Phone: [phone] Fax: [phone]     Social Determinants of Health (SDOH) Social History: SDOH Screenings   Food Insecurity: No Food Insecurity (10/23/2022)  Housing: Low Risk  (10/23/2022)  Transportation Needs: No Transportation Needs (10/23/2022)  Utilities: Not At Risk (10/23/2022)  Depression (PHQ2-9): Low Risk  (10/10/2022)  Tobacco Use: Low Risk  (11/06/2022)   SDOH Interventions:     Readmission Risk Interventions    11/10/2022    6:05 PM  Readmission Risk Prevention Plan  Transportation Screening Complete  PCP or Specialist Appt within 5-7 Days Complete  Home Care Screening Complete  Medication Review (RN CM) Complete

## 2022-11-10 NOTE — Evaluation (Signed)
Physical Therapy Evaluation Patient Details Name: Kaitlyn Bray MRN: [id number] DOB: 1966/02/20 Today's Date: 11/10/2022  History of Present Illness  Patient is a 57 year old female who underwent ventral hernia s/p robotic incisional repair on 6/10 complicated by bowel perforation, sepsis, and peritonitis s/p ex lap on 6/16 and wash out on 6/18, 6/20 with closing of the abdomen on 6/22. Patient was extubated on 6/26. PMH: obesity, diabetes, HTN  Clinical Impression  Pt admitted with above diagnosis. Pt currently with functional limitations due to the deficits listed below (see PT Problem List). Pt will benefit from acute skilled PT to increase their independence and safety with mobility to allow discharge.     The patient reports  independent PTA. Patient tolerated standing and transfer to recliner using STEY as patient could not stand and step today.  Patient's VSS. Patient benefit from post acute rehab for further  PT.      Recommendations for follow up therapy are one component of a multi-disciplinary discharge planning process, led by the attending physician.  Recommendations may be updated based on patient status, additional functional criteria and insurance authorization.  Follow Up Recommendations       Assistance Recommended at Discharge Frequent or constant Supervision/Assistance  Patient can return home with the following  Two people to help with walking and/or transfers;A lot of help with bathing/dressing/bathroom;Assistance with cooking/housework;Assist for transportation;Help with stairs or ramp for entrance    Equipment Recommendations None recommended by PT  Recommendations for Other Services       Functional Status Assessment Patient has had a recent decline in their functional status and demonstrates the ability to make significant improvements in function in a reasonable and predictable amount of time.     Precautions / Restrictions Precautions Precautions:  Fall;Other (comment) Precaution Comments: jp drains; abdominal wound vac; abdominal precautions Restrictions Weight Bearing Restrictions: No      Mobility  Bed Mobility                    Transfers                        Ambulation/Gait                  Stairs            Wheelchair Mobility    Modified Rankin (Stroke Patients Only)       Balance                                             Pertinent Vitals/Pain Pain Assessment Pain Assessment: Faces Faces Pain Scale: Hurts little more Pain Location: bottom Pain Descriptors / Indicators: Discomfort Pain Intervention(s): Limited activity within patient's tolerance, Monitored during session    Home Living Family/patient expects to be discharged to:: Private residence Living Arrangements: Spouse/significant other Available Help at Discharge: Family;Available 24 hours/day Type of Home: House Home Access: Ramped entrance       Home Layout: One level Home Equipment: Rollator (4 wheels);Shower seat      Prior Function Prior Level of Function : Independent/Modified Independent;Driving;Working/employed                     Hand Dominance   Dominant Hand: Right    Extremity/Trunk Assessment   Upper Extremity Assessment Upper Extremity Assessment: Generalized weakness (BUE  AROM generally WFL)    Lower Extremity Assessment Lower Extremity Assessment: Generalized weakness    Cervical / Trunk Assessment Cervical / Trunk Assessment: Other exceptions Cervical / Trunk Exceptions: abdominal vac; increased body habitus  Communication   Communication: No difficulties  Cognition                                                General Comments      Exercises     Assessment/Plan    PT Assessment Patient needs continued PT services  PT Problem List Decreased strength;Decreased activity tolerance;Decreased mobility;Decreased knowledge  of use of DME;Decreased skin integrity       PT Treatment Interventions DME instruction;Therapeutic activities;Functional mobility training;Therapeutic exercise;Patient/family education    PT Goals (Current goals can be found in the Care Plan section)  Acute Rehab PT Goals Patient Stated Goal: go home PT Goal Formulation: With patient Time For Goal Achievement: 11/24/22 Potential to Achieve Goals: Good    Frequency Min 1X/week     Co-evaluation   Reason for Co-Treatment: Complexity of the patient's impairments (multi-system involvement);For patient/therapist safety;To address functional/ADL transfers   OT goals addressed during session: ADL's and self-care       AM-PAC PT "6 Clicks" Mobility  Outcome Measure Help needed turning from your back to your side while in a flat bed without using bedrails?: A Lot Help needed moving from lying on your back to sitting on the side of a flat bed without using bedrails?: A Lot Help needed moving to and from a bed to a chair (including a wheelchair)?: Total Help needed standing up from a chair using your arms (e.g., wheelchair or bedside chair)?: Total Help needed to walk in hospital room?: Total Help needed climbing 3-5 steps with a railing? : Total 6 Click Score: 8    End of Session Equipment Utilized During Treatment: Gait belt Activity Tolerance: Patient tolerated treatment well;Patient limited by fatigue Patient left: in chair;with call bell/phone within reach;with chair alarm set Nurse Communication: Mobility status;Need for lift equipment PT Visit Diagnosis: Unsteadiness on feet (R26.81)    Time: 1610-9604 PT Time Calculation (min) (ACUTE ONLY): 45 min   Charges:   PT Evaluation $PT Eval Moderate Complexity: 1 Mod          Blanchard Kelch PT Acute Rehabilitation Services Office [phone] Weekend [phone]     Rada Hay 11/10/2022, 1:03 PM

## 2022-11-10 NOTE — Evaluation (Signed)
Clinical/Bedside Swallow Evaluation Patient Details  Name: NYAZIA LENIUS MRN: [id number] Date of Birth: October 22, 1965  Today's Date: 11/10/2022 Time: SLP Start Time (ACUTE ONLY): 1025 SLP Stop Time (ACUTE ONLY): 1039 SLP Time Calculation (min) (ACUTE ONLY): 14 min  Past Medical History:  Past Medical History:  Diagnosis Date   Asthma    Complication of anesthesia    Heart murmur    Hernia    Hypertension    New onset type 2 diabetes mellitus (HCC) 09/15/2022   PONV (postoperative nausea and vomiting)    Pre-diabetes    S/P hernia repair 08/26/2021   SBO (small bowel obstruction) (HCC) 09/15/2021   Past Surgical History:  Past Surgical History:  Procedure Laterality Date   ABDOMINAL HYSTERECTOMY     one ovary stlil in place. removed uterus cervix and pt thinks fallopian tubes   APPENDECTOMY     APPLICATION OF WOUND VAC N/A 11/04/2022   Procedure: APPLICATION OF WOUND VAC;  Surgeon: [name], MD;  Location: [facility];  Service: General;  Laterality: N/A;   CESAREAN SECTION     DEBRIDEMENT AND CLOSURE WOUND N/A 11/04/2022   Procedure: ABSORBABLE MESH CLOSURE OF ABDOMEN;  Surgeon: [name], MD;  Location: [facility];  Service: General;  Laterality: N/A;   INCISIONAL HERNIA REPAIR N/A 08/26/2021   Procedure: INCISIONAL HERNIA REPAIR;  Surgeon: [name], MD;  Location: [facility] OR;  Service: General;  Laterality: N/A;   INSERTION OF MESH N/A 08/26/2021   Procedure: INSERTION OF MESH;  Surgeon: [name], MD;  Location: [facility] OR;  Service: General;  Laterality: N/A;   LAPAROTOMY N/A 07/29/2020   Procedure: EXPLORATORY LAPAROTOMY, REPAIR OF VENTRAL HERNIA;  Surgeon: [name], MD;  Location: [facility] OR;  Service: General;  Laterality: N/A;   LAPAROTOMY N/A 10/29/2022   Procedure: EXPLORATORY LAPAROTOMY WITH REPAIR OF SMALL BOWEL PERFORATION, AND PLACEMENT OF WOUND VAC;  Surgeon: Harriette Bouillon, MD;  Location: [facility];  Service: General;  Laterality: N/A;   LAPAROTOMY N/A  10/31/2022   Procedure: EXPLORATORY LAPAROTOMY - WASH OUT OF ABDOMEN AND WOUND VAC PLACEMENT;  Surgeon: Abigail Miyamoto, MD;  Location: [facility];  Service: General;  Laterality: N/A;   LAPAROTOMY N/A 11/02/2022   Procedure: EXPLORATORY LAPAROTOMY Wound wash out of abdomen and wound vac change;  Surgeon: Abigail Miyamoto, MD;  Location: [facility];  Service: General;  Laterality: N/A;   MYOMECTOMY     XI ROBOTIC ASSISTED VENTRAL HERNIA N/A 10/23/2022   Procedure: ROBOTIC RECURRENT INCISIONAL HERNIA REPAIR WITH MESH WITH BILATERAL TRANVERSUS ABDOMINUS MYOFACIAL RELEASE AND BILATERAL POSTERIOR RECTUS MYOFACIAL RELEASE;  Surgeon: [name], MD;  Location: [facility];  Service: General;  Laterality: N/A;   HPI:  29F with ventral hernia s/p robotic incisional repair on 6/10 complicated by bowel perforation and peritonitis s/p ex lap on 6/16 and wash out on 6/18, 6/20 with closing of the abdomen on 6/22. 6/16-6/26 intubated, CXR recent Probable mild right basilar atelectasis.    Assessment / Plan / Recommendation  Clinical Impression  Pt presents with functional oropharyngeal swallow ability based on clinical swallow evaluation. She passed [facility] 3 ounce water challenge and observed with adequate timing of swallow and respirations.  Voice is not quite as strong as normal per pt/family however it is clear.  No focal CN deficits apprent.  Pt reports minimal baseline dysphagia due to her "hernias". Recommend diet as per surgery - Advised pt to monitor work of breathing with intake and assure taking rest breaks as  needed.  No SLP follow up needed. Family and pt agreeable to plan. SLP Visit Diagnosis: Dysphagia, unspecified (R13.10)    Aspiration Risk  Mild aspiration risk    Diet Recommendation Thin liquid (defer to md)    Liquid Administration via: Cup Medication Administration: Whole meds with liquid Supervision: Patient able to self feed Compensations: Slow rate;Small sips/bites Postural Changes:  Seated upright at 90 degrees    Other  Recommendations Oral Care Recommendations: Oral care BID    Recommendations for follow up therapy are one component of a multi-disciplinary discharge planning process, led by the attending physician.  Recommendations may be updated based on patient status, additional functional criteria and insurance authorization.  Follow up Recommendations No SLP follow up      Assistance Recommended at Discharge  N/a  Functional Status Assessment Patient has not had a recent decline in their functional status  Frequency and Duration     N/a       Prognosis     N/a   Swallow Study   General Date of Onset: 11/10/22 HPI: 43F with ventral hernia s/p robotic incisional repair on 6/10 complicated by bowel perforation and peritonitis s/p ex lap on 6/16 and wash out on 6/18, 6/20 with closing of the abdomen on 6/22. 6/16-6/26 intubated, CXR recent Probable mild right basilar atelectasis. Previous Swallow Assessment: none Diet Prior to this Study: Thin liquids (Level 0) (clears) Temperature Spikes Noted: No Respiratory Status: Nasal cannula History of Recent Intubation: Yes Total duration of intubation (days): 10 days Date extubated: 11/08/22 Behavior/Cognition: Alert Oral Cavity Assessment: Within Functional Limits Oral Care Completed by SLP: Recent completion by staff Oral Cavity - Dentition: Adequate natural dentition Vision: Functional for self-feeding Self-Feeding Abilities: Able to feed self Patient Positioning: Upright in chair Baseline Vocal Quality: Normal Volitional Swallow: Able to elicit    Oral/Motor/Sensory Function Overall Oral Motor/Sensory Function: Within functional limits   Ice Chips Ice chips: Not tested   Thin Liquid Thin Liquid: Within functional limits Presentation: Cup;Self Fed    Nectar Thick Nectar Thick Liquid: Not tested   Honey Thick Honey Thick Liquid: Not tested   Puree Puree: Impaired Presentation: Self Fed;Spoon Other  Comments: pt took too large of a bolus, requiring time to clear; occasional throat clearing observed but pt has been coughing up secretions -   Solid     Solid: Not tested      Chales Abrahams 11/10/2022,11:29 AM   Rolena Infante, MS [facility] SLP Acute Rehab Services Office [phone]

## 2022-11-10 NOTE — Progress Notes (Signed)
NAME:  SHRADHA FERRARE, MRN:  [id number], DOB:  11/05/1965, LOS: 18 ADMISSION DATE:  10/23/2022, CONSULTATION DATE:  6/16 REFERRING MD:  Cornett- surgery, CHIEF COMPLAINT:  post-op vent   History of Present Illness:  Ms. Eck is a 57 y/o woman with a history of a ventral hernia with recent robotic incisional hernia repair on 6/10 who had been progressing well until she developed fever and drainage of foul-smelling fluid from her lower abdominal incision. She returned to the OR for exploratory ex-lap 6/16, found to have bowel contents throughout her abdomen. She required excision of the mesh and identification of her site of perforation, which was repaired then resected with end-to-end anastamosis. Her abdomen was unable to be closed and despite extensive irrigation, she will require additional washout. She had cultures collected intraoperatively. Zosyn was given in the OR.  She was brought to the ICU intubated, sedated, paralyzed post-op. She will remain intubated until she is able to return to the OR to be closed.   She has a complicated abdominal surgery history with previous hysterectomy, multiple incisional hernias in the past. She has had post-op ileus complicating several previous surgeries.   Pertinent  Medical History  Obesity Diabetes Hypertension  Significant Hospital Events: Including procedures, antibiotic start and stop dates in addition to other pertinent events   6/10 hernia repair with mesh 6/14 Urine output decreased overnight 10  6/16 ex lap resection of small bowel perf. Gross contamination of space. Left open and intubated. Pccm consult   6/18 ex lap washout. Significantly dilated bowel, turbid fluid. Left open  6/19 started TPN 6/20 plan for OR today for washout  6/21 adding fluc. ID consult  6/22 to return the OR, abdomen closed, large wound VAC in place 6/24 continued Vent weaning, continued ABX, wean sedation as able. CXR repeat shows mild R atelectasis, Increasing  PEEP to 8 and attempting to wean down/shut off versed gtt in preparation for extubation when able 6/26 extubated, fent PCA   6/27 having Bms 6/28 SDU status. Dc PCA   Interim History / Subjective:   Continues to be incontinent of stool   Objective   Blood pressure (!) 160/91, pulse 88, temperature 97.7 F (36.5 C), temperature source Oral, resp. rate (!) 27, height 5\' 5"  (1.651 m), weight 107.2 kg, SpO2 99 %.    FiO2 (%):  [28 %] 28 %   Intake/Output Summary (Last 24 hours) at 11/10/2022 4098 Last data filed at 11/10/2022 0600 Gross per 24 hour  Intake 2883.09 ml  Output 750 ml  Net 2133.09 ml   Filed Weights   11/07/22 0500 11/08/22 0500 11/10/22 0500  Weight: 105.8 kg 110.2 kg 107.2 kg   Examination: General:  Ill appearing middle aged F NAD  HEENT: NCAT pink mm  Neuro: Generalized wkness. AAOx3   CV: rr cap refill < 3 sec  PULM:  Shallow respirations, CTA  GI: Midline wound vac w good seal. Mild tenderness proximal to vac site. + bowel sounds  Extremities: RUE PICC  Skin: pale c/d cool    Resolved Hospital Problem list   Hyperkalemia,  Hyponatremia Shock Endotracheally intubated   Assessment & Plan:   S/p complex robotic hernia repair c/b small bowel perf Sepsis due to polymicrobial peritonitis in setting of above Post op pain  -hernia repair 6/10, ex lap SB resection 6/16, ex lap washout 6/18, ex lap washout 6/20, washout closure 6/22, vac change 6/24  -abd fluid: moderate group G strep, moderate actinomyces, abundant bacteroides ovatus  beta lactamase pos, moderate candida  P -post op per CCS  -swallow study today then hopefully clears  -cont on TPN for now  -zosyn, fluc  -- anticipate prolonged course -ID following  -repeat CT a/p in a few days (target 6/27-6/29) to eval for possible remaining fluid collection -dc fent PCA for PO PRNs -IS, mobility   DM w hyperglycemia  P -SSI   Anemia Thrombocytosis -hgb stable, in setting of critical illness and  iatrogenic losses. Did not have significant op losses  -thrombocytosis is likely reactive  P -PRN CBC  Physical deconditioning  ICU delirium  P -PT/OT -Delirium precautions   L/T/D -maintain PICC -- req TPN   D/w primary. Plan to de-escalate pt to stepdown status. PCCM will sign off. Please re-engage if we can be of further assistance or it pt clinical status changes   Best Practice (right click and "Reselect all SmartList Selections" daily)   Diet/type: Clears when ok w SLP;  TPN DVT prophylaxis: LMWH GI prophylaxis: PPI Lines: Central line/ PICC RUE Foley:  N/A Code Status:  full code Last date of multidisciplinary goals of care discussion- pt updated daily  CCT n/a   Tessie Fass MSN, AGACNP-BC [facility] Pulmonary/Critical Care Medicine Amion for pager  11/10/2022, 9:23 AM

## 2022-11-10 NOTE — Progress Notes (Addendum)
[facility] Infectious Diseases Follow Up Note  Patient Identification: Patient Name: Kaitlyn Bray MRN: [id number] Admit Date: 10/23/2022  5:34 AM Age: 57 y.o.Today's Date: 11/10/2022  Reason for Visit: Complicated intra-abdominal infection, feculent peritonitis  Principal Problem:   Recurrent ventral hernia Active Problems:   Peritonitis (HCC)   Endotracheally intubated   Small bowel perforation (HCC)   Acute respiratory failure (HCC)   Post-op pain   Sepsis due to Candida species [facility])   Physical deconditioning  Antibiotics/Antifungals Fluconazole 6/21-c Pip-tazo 6/14-c Azithromycin 6/14 Vancomycin 6/22-6/25   Lines/Hardware: PICC right arm  Interval Events: afebrile for more than 48 hrs, leukocytosis downtrending  Assessment 57 YO female with Type 2 DM and multiple abdominal surgeries admitted with    # Post surgical complicated intraabdominal infection/Feculent peritonitis  - most recently had robotic incisional hernia repair with mesh ( vicryl mesh patch of posterior layer of repair) , removal of previous intraperitoneal mesh and lysis of adhesions, bilateral transversus abdominis and bilateral posterior rectus myofascial release on 6/10 who had been progressing well until started developing fever and drainage of foul-smelling fluid from her lower abdominal incision site - underwent OR for exploratory ex lap with resection of small bowel perforation and primary anastomosis and excision of mesh with placement of ABThera wound VAC and open abdomen to complete loss of domain and peritonitis with significant gross contamination of the peritoneal and subcutaneous space( faeculent peritonitis) on 6/16, found to have bowel contents throughout her abdomen. Cultures polymicrobial ( group G streptococci, actinomyecs, bacteroides ovatus beta lactamase positive and candida albicans).  Her abdomen was unable to be closed - underwent  repeat OR on 6/18 for ex lap, washout of abdomen and ABThera wound VAC placement then  - 6/20 lap with washout of abdomen and ABThera wound VAC placement  - 6/22 retromuscular absorbable mesh bridging closure of abdomen and application of wound VAC. D/w Dr Dossie Der, all prior mesh was completely removed and absorbable mesh placed which may take 9 months to absorb  # Fevers resolved/leukocytosis - improving   Recommendations Continue zosyn and fluconazole pending repeat CT planned by surgery to evaluate for undrained fluid collection Monitor CBC and CMP on abtx  Expect will be able to transition to PO abtx for dispo, duration to be determined and may need long duration pending clinical progress   Dr Thedore Mins covering this weekend with questions. New ID team to fu starting Monday   Rest of the management as per the primary team. Thank you for the consult. Please page with pertinent questions or concerns.  ______________________________________________________________________ Subjective patient seen and examined at the bedside. More awake and follows commands. Reports passing small flatus and stool  Vitals BP (!) 152/83   Pulse 85   Temp 97.7 F (36.5 C) (Oral)   Resp (!) 29   Ht 5\' 5"  (1.651 m)   Wt 107.2 kg   SpO2 99%   BMI 39.33 kg/m     Physical Exam Constitutional: morbidly obese female lying in the bed    Comments: Opens eyes and follows commands   Cardiovascular:     Rate and Rhythm: Normal rate and regular rhythm.     Heart sounds:   Pulmonary:     Effort: Pulmonary effort is normal on [place]     Comments: Coarse breath sounds bilaterally  Abdominal:     Palpations: Abdomen is soft.     Tenderness: Midline wound VAC no surrounding cellulitis or leak, LUQ and RUQ drain   Musculoskeletal:  General: No swelling or tenderness in peripheral joints   Skin:    Comments: no rashes. PICC with no concerns  Pertinent Microbiology Results for orders placed or  performed during the hospital encounter of 10/23/22  MRSA Next Gen by PCR, Nasal     Status: None   Collection Time: 10/29/22  4:49 PM   Specimen: Nasal Mucosa; Nasal Swab  Result Value Ref Range Status   MRSA by PCR Next Gen NOT DETECTED NOT DETECTED Final    Comment: (NOTE) The GeneXpert MRSA Assay (FDA approved for NASAL specimens only), is one component of a comprehensive MRSA colonization surveillance program. It is not intended to diagnose MRSA infection nor to guide or monitor treatment for MRSA infections. Test performance is not FDA approved in patients less than 84 years old. Performed at [facility], [address], [place], [place] [address]   Aerobic/Anaerobic Culture w Gram Stain (surgical/deep wound)     Status: None   Collection Time: 10/29/22  6:33 PM   Specimen: Path fluid; Body Fluid  Result Value Ref Range Status   Specimen Description FLUID ABDOMEN  Final   Special Requests SWAB ID A  Final   Gram Stain   Final    NO WBC SEEN FEW GRAM NEGATIVE RODS RARE GRAM POSITIVE RODS RARE GRAM POSITIVE COCCI Performed at [facility], [address] [place], [place] [address]    Culture   Final    MODERATE STREPTOCOCCUS GROUP G Beta hemolytic streptococci are predictably susceptible to penicillin and other beta lactams. Susceptibility testing not routinely performed. MODERATE ACTINOMYCES SPECIES Standardized susceptibility testing for this organism is not available. ABUNDANT BACTEROIDES OVATUS BETA LACTAMASE POSITIVE MODERATE CANDIDA ALBICANS    Report Status 11/02/2022 FINAL  Final  Culture, blood (Routine X 2) w Reflex to ID Panel     Status: None   Collection Time: 11/03/22 10:18 AM   Specimen: BLOOD LEFT ARM  Result Value Ref Range Status   Specimen Description   Final    BLOOD LEFT ARM Performed at [facility], [address] [place], [place] [address]    Special Requests   Final    BOTTLES DRAWN AEROBIC ONLY  Blood Culture adequate volume Performed at [facility], [address] [place], [place] [address]    Culture   Final    NO GROWTH 5 DAYS Performed at [facility], [address] [place], [place] [address]    Report Status 11/08/2022 FINAL  Final  Culture, blood (Routine X 2) w Reflex to ID Panel     Status: None   Collection Time: 11/03/22 10:20 AM   Specimen: BLOOD LEFT HAND  Result Value Ref Range Status   Specimen Description   Final    BLOOD LEFT HAND Performed at [facility], [address], [place], [place] [address]    Special Requests   Final    BOTTLES DRAWN AEROBIC ONLY Blood Culture results may not be optimal due to an inadequate volume of blood received in culture bottles Performed at [facility], [address] [place], [place] [address]    Culture   Final    NO GROWTH 5 DAYS Performed at [facility], [address] [place], [place] [address]    Report Status 11/08/2022 FINAL  Final    Pertinent Lab.    Latest Ref Rng & Units 11/09/2022    6:13 AM 11/07/2022  5:45 AM 11/06/2022    8:15 AM  CBC  WBC 4.0 - 10.5 K/uL 19.5  20.4  21.4   Hemoglobin 12.0 - 15.0 g/dL 8.6  8.0  8.1   Hematocrit 36.0 - 46.0 % 28.8  28.0  28.7   Platelets 150 - 400 K/uL 458  446  436       Latest Ref Rng & Units 11/10/2022    5:22 AM 11/09/2022    6:13 AM 11/08/2022    4:49 AM  CMP  Glucose 70 - 99 mg/dL 161  096  045   BUN 6 - 20 mg/dL 18  18  22    Creatinine 0.44 - 1.00 mg/dL 4.09  8.11  9.14   Sodium 135 - 145 mmol/L 139  138  142   Potassium 3.5 - 5.1 mmol/L 4.1  3.9  4.0   Chloride 98 - 111 mmol/L 104  103  113   CO2 22 - 32 mmol/L 26  27  25    Calcium 8.9 - 10.3 mg/dL 8.3  8.1  7.5   Total Protein 6.5 - 8.1 g/dL  6.1    Total Bilirubin 0.3 - 1.2 mg/dL  0.4    Alkaline Phos 38 - 126 U/L  77    AST 15 - 41 U/L  26    ALT 0 - 44 U/L  18       Pertinent Imaging today Plain films and CT images  have been personally visualized and interpreted; radiology reports have been reviewed. Decision making incorporated into the Impression /   No results found.  I have personally spent 52 minutes involved in face-to-face and non-face-to-face activities for this patient on the day of the visit. Professional time spent includes the following activities: Preparing to see the patient (review of tests), Obtaining and/or reviewing separately obtained history (admission/discharge record), Performing a medically appropriate examination and/or evaluation , Ordering medications/tests/procedures, referring and communicating with other health care professionals, Documenting clinical information in the EMR, Independently interpreting results (not separately reported), Communicating results to the patient/family/caregiver, Counseling and educating the patient/family/caregiver and Care coordination (not separately reported).   Plan d/w requesting provider as well as ID pharm D  Note: This document was prepared using dragon voice recognition software and may include unintentional dictation errors.   Electronically signed by:   Odette Fraction, MD Infectious Disease Physician [facility] Pager: [phone]

## 2022-11-10 NOTE — Progress Notes (Signed)
6 Days Post-Op   Subjective/Chief Complaint: Urinating, having bowel movements.  Weak.  Difficulty swallowing.  Objective: Vital signs in last 24 hours: Temp:  [98.3 F (36.8 C)-99 F (37.2 C)] 98.3 F (36.8 C) (06/28 0449) Pulse Rate:  [73-96] 73 (06/28 0700) Resp:  [20-31] 25 (06/28 0748) BP: (149-184)/(72-102) 164/92 (06/28 0700) SpO2:  [98 %-100 %] 100 % (06/28 0748) FiO2 (%):  [28 %] 28 % (06/27 1600) Weight:  [107.2 kg] 107.2 kg (06/28 0500) Last BM Date : 11/09/22  Intake/Output from previous day: 06/27 0701 - 06/28 0700 In: 2883.1 [I.V.:2028.8; IV Piggyback:854.3] Out: 950 [Urine:900; Drains:50] Intake/Output this shift: No intake/output data recorded.  Exam: Intubated and sedated Abdomen soft, Wound vac holding suction with serosanguinous drainage  Lab Results:  Recent Labs    11/09/22 0613  WBC 19.5*  HGB 8.6*  HCT 28.8*  PLT 458*    BMET Recent Labs    11/09/22 0613 11/10/22 0522  NA 138 139  K 3.9 4.1  CL 103 104  CO2 27 26  GLUCOSE 144* 161*  BUN 18 18  CREATININE 0.47 0.52  CALCIUM 8.1* 8.3*    PT/INR No results for input(s): "LABPROT", "INR" in the last 72 hours. ABG No results for input(s): "PHART", "HCO3" in the last 72 hours.  Invalid input(s): "PCO2", "PO2"  Studies/Results: No results found.  Anti-infectives: Anti-infectives (From admission, onward)    Start     Dose/Rate Route Frequency Ordered Stop   11/05/22 1215  vancomycin (VANCOREADY) IVPB 1750 mg/350 mL  Status:  Discontinued        1,750 mg 175 mL/hr over 120 Minutes Intravenous Every 24 hours 11/04/22 1125 11/08/22 1023   11/04/22 1215  vancomycin (VANCOREADY) IVPB 2000 mg/400 mL        2,000 mg 200 mL/hr over 120 Minutes Intravenous STAT 11/04/22 1125 11/04/22 1402   11/04/22 1000  fluconazole (DIFLUCAN) IVPB 400 mg        400 mg 100 mL/hr over 120 Minutes Intravenous Every 24 hours 11/03/22 0801     11/03/22 0845  fluconazole (DIFLUCAN) IVPB 800 mg         800 mg 100 mL/hr over 240 Minutes Intravenous  Once 11/03/22 0759 11/03/22 1327   10/28/22 1000  azithromycin (ZITHROMAX) tablet 250 mg  Status:  Discontinued       See Hyperspace for full Linked Orders Report.   250 mg Oral Daily 10/27/22 0738 10/27/22 1653   10/27/22 1700  piperacillin-tazobactam (ZOSYN) IVPB 3.375 g        3.375 g 12.5 mL/hr over 240 Minutes Intravenous Every 8 hours 10/27/22 1653     10/27/22 1000  azithromycin (ZITHROMAX) tablet 500 mg       See Hyperspace for full Linked Orders Report.   500 mg Oral Daily 10/27/22 0738 10/27/22 0946   10/23/22 1054  ceFAZolin (ANCEF) 2-4 GM/100ML-% IVPB       Note to Pharmacy: Augustine Radar R: cabinet override      10/23/22 1054 10/23/22 2259   10/23/22 0600  ceFAZolin (ANCEF) IVPB 3g/100 mL premix        3 g 200 mL/hr over 30 Minutes Intravenous On call to O.R. 10/23/22 0540 10/23/22 1213       Assessment/Plan: Kaitlyn Bray underwent difficult robotic recurrent incisional hernia repair with mesh, bilateral posterior rectus myofascial release, bilateral transversus abdominis release and removal of previous polypropylene mesh with prolonged lyses of adhesions on 10/23/22. This was complicated by bowel perforation, possible  missed enterotomy during dissection of small intestine off mesh, requiring return to the operating room with washout, small bowel resection and ABTHERA placement on 10/29/22.  Returned to the OR for wash out 6/18 and 6/20.  Underwent bridging retromuscular Phasix mesh closure of the abdomen 11/04/22.  ID consult for polymicrobial infection - continue antibiotics Fevers may be related to phasix and vicryl mesh placed in a contaminated field.  Will need long term antibiotics.  Plan for CT scan 5-7 days post op to evaluate for undrained fluid collection if fevers continue but vac should be draining retromuscular space and hopefully Jps draining intra-abdominal space - both without signs of infection Vac changed today 6/27  with 3 pieces of white foam and 1 piece of black foam.  Holding seal.  Next vac change per wound ostomy team on Monday NG removed Speech therapy eval - okay for diet advancement to regular diet once cleared by speech PT/OT Lower to step down level of care Critical care signing off, appreciate assistance ID following  Dispo plans for rehab likely, will need wound vac care, will need plan in place for antibiotics, will need significant therapy.  Hopefully she will come off TPN and be eating a regular diet prior to discharge pending improvement with speech therapy.  Anticipate the patient will be here through the weekend, possible discharge next weeks some time.  [name], MD  11/10/2022

## 2022-11-11 LAB — COMPREHENSIVE METABOLIC PANEL
ALT: 31 U/L (ref 0–44)
AST: 35 U/L (ref 15–41)
Albumin: 1.9 g/dL — ABNORMAL LOW (ref 3.5–5.0)
Alkaline Phosphatase: 90 U/L (ref 38–126)
Anion gap: 7 (ref 5–15)
BUN: 20 mg/dL (ref 6–20)
CO2: 26 mmol/L (ref 22–32)
Calcium: 7.9 mg/dL — ABNORMAL LOW (ref 8.9–10.3)
Chloride: 102 mmol/L (ref 98–111)
Creatinine, Ser: 0.56 mg/dL (ref 0.44–1.00)
GFR, Estimated: >60 mL/min (ref >60)
Glucose, Bld: 177 mg/dL — ABNORMAL HIGH (ref 70–99)
Potassium: 3.9 mmol/L (ref 3.5–5.1)
Sodium: 135 mmol/L (ref 135–145)
Total Bilirubin: 0.2 mg/dL — ABNORMAL LOW (ref 0.3–1.2)
Total Protein: 6.1 g/dL — ABNORMAL LOW (ref 6.5–8.1)

## 2022-11-11 LAB — GLUCOSE, CAPILLARY
Glucose-Capillary: 144 mg/dL — ABNORMAL HIGH (ref 70–99)
Glucose-Capillary: 150 mg/dL — ABNORMAL HIGH (ref 70–99)
Glucose-Capillary: 168 mg/dL — ABNORMAL HIGH (ref 70–99)
Glucose-Capillary: 170 mg/dL — ABNORMAL HIGH (ref 70–99)
Glucose-Capillary: 172 mg/dL — ABNORMAL HIGH (ref 70–99)

## 2022-11-11 LAB — MAGNESIUM: Magnesium: 2.1 mg/dL (ref 1.7–2.4)

## 2022-11-11 LAB — PHOSPHORUS: Phosphorus: 3 mg/dL (ref 2.5–4.6)

## 2022-11-11 MED ORDER — VITAMIN C 500 MG PO TABS
500.0000 mg | ORAL_TABLET | Freq: Every day | ORAL | Status: DC
  Administered 2022-11-11 – 2022-11-16 (×6): 500 mg via ORAL
  Filled 2022-11-11 (×6): qty 1

## 2022-11-11 MED ORDER — LOPERAMIDE HCL 2 MG PO CAPS
2.0000 mg | ORAL_CAPSULE | Freq: Four times a day (QID) | ORAL | Status: DC | PRN
  Administered 2022-11-11 – 2022-11-12 (×3): 2 mg via ORAL
  Filled 2022-11-11 (×3): qty 1

## 2022-11-11 MED ORDER — GABAPENTIN 400 MG PO CAPS
400.0000 mg | ORAL_CAPSULE | Freq: Three times a day (TID) | ORAL | Status: DC
  Administered 2022-11-11 – 2022-11-16 (×17): 400 mg via ORAL
  Filled 2022-11-11 (×17): qty 1

## 2022-11-11 MED ORDER — FAMOTIDINE 20 MG PO TABS
20.0000 mg | ORAL_TABLET | Freq: Two times a day (BID) | ORAL | Status: DC
  Administered 2022-11-11 – 2022-11-16 (×11): 20 mg via ORAL
  Filled 2022-11-11 (×11): qty 1

## 2022-11-11 MED ORDER — ALBUTEROL SULFATE HFA 108 (90 BASE) MCG/ACT IN AERS: 2.0000 | INHALATION_SPRAY | Freq: Four times a day (QID) | RESPIRATORY_TRACT | Status: DC | PRN

## 2022-11-11 MED ORDER — VITAMIN B-12 1000 MCG PO TABS
500.0000 ug | ORAL_TABLET | Freq: Every day | ORAL | Status: DC
  Administered 2022-11-11 – 2022-11-16 (×6): 500 ug via ORAL
  Filled 2022-11-11 (×6): qty 1

## 2022-11-11 MED ORDER — LOSARTAN POTASSIUM 50 MG PO TABS
25.0000 mg | ORAL_TABLET | Freq: Every day | ORAL | Status: DC
  Administered 2022-11-11 – 2022-11-16 (×6): 50 mg via ORAL
  Filled 2022-11-11 (×6): qty 1

## 2022-11-11 MED ORDER — ACETAMINOPHEN 500 MG PO TABS
1000.0000 mg | ORAL_TABLET | Freq: Four times a day (QID) | ORAL | Status: DC
  Administered 2022-11-11 – 2022-11-16 (×20): 1000 mg via ORAL
  Filled 2022-11-11 (×22): qty 2

## 2022-11-11 MED ORDER — MECLIZINE HCL 25 MG PO TABS: 25.0000 mg | ORAL_TABLET | Freq: Three times a day (TID) | ORAL | Status: DC | PRN

## 2022-11-11 MED ORDER — B-12 500 MCG PO TABS: 500.0000 ug | ORAL_TABLET | Freq: Every day | ORAL | Status: DC

## 2022-11-11 MED ORDER — HYDROCHLOROTHIAZIDE 12.5 MG PO TABS
6.2500 mg | ORAL_TABLET | Freq: Every day | ORAL | Status: DC
  Administered 2022-11-11 – 2022-11-16 (×6): 12.5 mg via ORAL
  Filled 2022-11-11 (×6): qty 1

## 2022-11-11 MED ORDER — ALBUTEROL SULFATE (2.5 MG/3ML) 0.083% IN NEBU: 2.5000 mg | INHALATION_SOLUTION | Freq: Four times a day (QID) | RESPIRATORY_TRACT | Status: DC | PRN

## 2022-11-11 MED ORDER — LOSARTAN POTASSIUM-HCTZ 50-12.5 MG PO TABS: 1.0000 | ORAL_TABLET | Freq: Every day | ORAL | Status: DC

## 2022-11-11 MED ORDER — TRAVASOL 10 % IV SOLN
INTRAVENOUS | Status: AC
  Filled 2022-11-11: qty 1440

## 2022-11-11 NOTE — Progress Notes (Signed)
Inpatient Rehab Admissions:  Inpatient Rehab Consult received.  I spoke with patient on the telephone for rehabilitation assessment and to discuss goals and expectations of an inpatient rehab admission.  Discussed average length of stay, insurance authorization requirement, discharge home after completion of CIR. Pt acknowledged understanding. Pt interested in pursuing CIR. Pt gave permission to contact husband and friend Arlene. Spoke with Arlene on the telephone. She also acknowledged understanding of CIR goals and expectations. She is supportive of pt pursuing CIR. She confirmed that pt will discharge to her house and she will be able to provide 24/7 support after discharge. Attempted to contact pt's husband Molly Maduro. Received no answer. Unable to leave a message. Will continue to follow.  Signed: Wolfgang Phoenix, MS, CCC-SLP Admissions Coordinator [phone]

## 2022-11-11 NOTE — Progress Notes (Addendum)
11/11/2022  Kaitlyn Bray [id number] 1965-09-19  CARE TEAM: PCP: Mort Sawyers, FNP  Outpatient Care Team: Patient Care Team: Mort Sawyers, FNP as PCP - General (Family Medicine) C[facility], Md, MD (General Surgery)  Inpatient Treatment Team: Treatment Team: Attending Provider: Stechschulte, Hyman Hopes, MD; WOC Nurse: Vassie Loll, RN; Technician: Macarthur Critchley, NT; Registered Nurse: Sherri Rad, RN; eLink Nurse: Darrick Grinder, RN; Technician: Tenna Delaine, NT; Registered Nurse: Nanci Pina, RN; Utilization Review: Clydia Llano, RN   Problem List:   Principal Problem:   Recurrent ventral hernia Active Problems:   Peritonitis (HCC)   Endotracheally intubated   Small bowel perforation (HCC)   Acute respiratory failure (HCC)   Post-op pain   Sepsis due to Candida species [facility])   Physical deconditioning   11/04/2022  Procedure(s): ABSORBABLE MESH CLOSURE OF ABDOMEN APPLICATION OF WOUND VAC    Assessment [facility] Stay = 19 days) 7 Days Post-Op    10/23/2022 Postoperative Diagnosis: RECURRENT VENTRAL HERNIA              13cm tall by 13 cm wide based on preoperative CT   Surgical Procedure:  ROBOTIC RECURRENT INCISIONAL HERNIA REPAIR WITH MESH  BILATERAL TRANVERSUS ABDOMINUS MYOFACIAL RELEASE  BILATERAL POSTERIOR RECTUS MYOFACIAL RELEASE REMOVAL OF PREVIOUS INTRAPERITONEAL MESH TWO HOUR ROBOTIC LYSIS OF ADHESIONS   Operative Team Members: [name], MD - Primary   10/29/2022   Preoperative diagnosis: Status post robotic complex ventral hernia repair with component separation with mesh with drainage from abdominal wall foul-smelling and tachycardia   Postoperative diagnosis: Small bowel injury with perforation and Berman Grainger contamination of abdominal cavity as well as subcutaneous space and mesh   Procedure: Exploratory laparotomy with resection of small bowel perforation and primary anastomosis and excision of mesh with  placement of ABThera wound VAC and open abdomen due to complete loss of domain and peritonitis with significant Tykeisha Peer contamination of the peritoneal and subcutaneous space   Surgeon: Harriette Bouillon, MD   10/31/2022  Pre-op Diagnosis: OPEN ABDOMEN     Post-op Diagnosis: SAME   Procedure(s): EXPLORATORY LAPAROTOMY  WASH OUT OF ABDOMEN  ABTHERA WOUND VAC PLACEMENT   Surgeon(s): Abigail Miyamoto, MD  11/02/2022  Pre-op Diagnosis: OPEN ABDOMEN     Post-op Diagnosis: SAME   Procedure(s): EXPLORATORY LAPAROTOMY  WASH OUT OF ABDOMEN ABTHERA WOUND VAC PLACEMENT   Surgeon(s): Abigail Miyamoto, MD  11/04/2022  Postoperative Diagnosis: Open abdomen    Surgical Procedure:  RETROMUSCULAR ABSORBABLE MESH BRIDGING CLOSURE OF ABDOMEN:  APPLICATION OF WOUND TO VAC 25CM X 15 CM X 4 CM LOWER MIDLINE ABDOMINAL WOUND   Operative Team Members:  Surgeon(s) and Role:    * Stechschulte, Hyman Hopes, MD - Primary    * Abigail Miyamoto, MD - Assisting   Findings: The remainder of the Bard soft mesh was removed from the abdomen, the bowel anastomosis appeared to be intact and there appeared to be no significant undrained fluid collections or other pelvis injury within the abdomen.  The abdomen was washed out and the abdomen was closed using Vicryl mesh to recreate the peritoneal sac and closed the posterior rectus fascia in the midline, and a 35 cm x 30 cm piece of phasic's mesh in the retromuscular space to bridge the abdomen closed with a wound VAC to close the 25 x 15 cm wound with 4 pieces of white sponge overlying the mesh 1 piece of black sponge over the white sponge and 125 mmHg of suction.  Plan: Assessment/Plan: Numerous operations: difficult robotic recurrent incisional hernia repair with mesh, bilateral posterior rectus myofascial release, bilateral transversus abdominis release and removal of previous polypropylene mesh with prolonged lyses of adhesions on 10/23/22.   This was  complicated by delayed bowel perforation requiring return to the operating room with washout, small bowel resection and ABTHERA placement on 10/29/22.    Returned to the OR for wash out 6/18 and 6/20.    Underwent bridging retromuscular Phasix mesh closure of the abdomen 11/04/22.   ID  -ID consult for polymicrobial infection - continue antibiotics -Fevers resolving. -Having return of bowel function encouraging Most likely low threshold for repeat CAT scan Monday.  Check CBC and regroup.  For postoperative fluid collections given feculent peritonitis continue patient with delayed perforation   Wound -  Vac change 6/27 with 3 pieces of white foam and 1 piece of black foam.  Holding seal.   Next vac change per wound ostomy team on Monday  FEN -on full TPN nutrition.  Holding lipids.  Ileus resolving.  Cleared by speech therapy for some liquids.  Well advance to dysphagia 1 diet and regroup   PT/OT  Transition from intravenous to oral medications especially pain control.  Lower to step down level of care  Critical care signing off, appreciate assistance  Diabetes.  Placed [place] some for now.  Transition Back to Oral Hypoglycemics and.  Hypertension.  Chronically Elevated.  Restarted Losartan/Hydrochlorothiazide with Blood Pressure with a Hold.  History of Some Heartburn Reflux.  Famotidine for Now and Regroup.  Asthma.  Inhalers following.  Pulmonary toilet. Follow sats.   Dispo plans for rehab likely -evaluation done yesterday and tentatively a candidate for inpatient rehab., will need wound vac care, will need plan in place for antibiotics, will need significant therapy. If can advance diet and wean off TPN, probably discharge next week.   -VTE prophylaxis- SCDs, etc  -mobilize as tolerated to help recovery  -Disposition:  Disposition:  The patient is from: Home Anticipate discharge to:  Inpatient Rehab ([facility]IR) Anticipated Date of Discharge is:  July 2,2024    Barriers to discharge:  Consultant clearance & sign off  , Social/Financial Barriers, Therapy assessment & Recommendations pending, Need for inpatient procedure/study, and Pending Clinical improvement (more likely than not)  Patient currently is NOT MEDICALLY STABLE for discharge from the hospital from a surgery standpoint.      I reviewed nursing notes, Consultant ID notes, hospitalist notes, last 24 h vitals and pain scores, last 48 h intake and output, last 24 h labs and trends, and last 24 h imaging results.  I have reviewed this patient's available data, including medical history, events of note, test results, etc as part of my evaluation.   A significant portion of that time was spent in counseling. Care during the described time interval was provided by me.  This care required high  level of medical decision making.  11/11/2022    Subjective: (Chief complaint)  Patient without any nausea or vomiting.  Thirsty/hungry.  Therapies and rehab evaluation.  Possible candidate for inpatient rehab.    Events yesterday.  Objective:  Vital signs:  Vitals:   11/11/22 0300 11/11/22 0400 11/11/22 0500 11/11/22 0600  BP: (!) 145/87 (!) 163/79 (!) 147/81 (!) 165/81  Pulse: 76 78 85 82  Resp: 19 (!) 22 (!) 21 (!) 29  Temp: 98.7 F (37.1 C)     TempSrc: Oral     SpO2: 100% 99% 100% 91%  Weight:  107.2 kg   Height:        Last BM Date : 11/10/22  Intake/Output   Yesterday:  06/28 0701 - 06/29 0700 In: 1666 [P.O.:840; I.V.:571.9; IV Piggyback:254] Out: 45 [Drains:45] This shift:  No intake/output data recorded.  Bowel function:  Flatus: YES  BM:  YES  Drain: (No drain)   Physical Exam:  General: Pt awake/alert in no acute distress nontoxic.  Asking numerous appropriate questions Eyes: PERRL, normal EOM.  Sclera clear.  No icterus Neuro: CN II-XII intact w/o focal sensory/motor deficits. Lymph: No head/neck/groin lymphadenopathy Psych:  No  delerium/psychosis/paranoia.  Oriented x 4 HENT: Normocephalic, Mucus membranes moist.  No thrush Neck: Supple, No tracheal deviation.  No obvious thyromegaly Chest: No pain to chest wall compression.  Good respiratory excursion.  No audible wheezing CV:  Pulses intact.  Regular rhythm.  No major extremity edema MS: Normal AROM mjr joints.  No obvious deformity  Abdomen: Soft.  Nondistended.  Mildly tender at incisions only.  Large wound VAC in place with serosanguineous drainage.  No evidence of peritonitis.  No incarcerated hernias.  Ext:   No deformity.  No mjr edema.  No cyanosis Skin: No petechiae / purpurea.  No major sores.  Warm and dry    Results:   Cultures: Recent Results (from the past 720 hour(s))  MRSA Next Gen by PCR, Nasal     Status: None   Collection Time: 10/29/22  4:49 PM   Specimen: Nasal Mucosa; Nasal Swab  Result Value Ref Range Status   MRSA by PCR Next Gen NOT DETECTED NOT DETECTED Final    Comment: (NOTE) The GeneXpert MRSA Assay (FDA approved for NASAL specimens only), is one component of a comprehensive MRSA colonization surveillance program. It is not intended to diagnose MRSA infection nor to guide or monitor treatment for MRSA infections. Test performance is not FDA approved in patients less than 78 years old. Performed at [facility], [address] [place], [place] [address]   Aerobic/Anaerobic Culture w Gram Stain (surgical/deep wound)     Status: None   Collection Time: 10/29/22  6:33 PM   Specimen: Path fluid; Body Fluid  Result Value Ref Range Status   Specimen Description FLUID ABDOMEN  Final   Special Requests SWAB ID A  Final   Gram Stain   Final    NO WBC SEEN FEW GRAM NEGATIVE RODS RARE GRAM POSITIVE RODS RARE GRAM POSITIVE COCCI Performed at [facility], [address], [place], [place] [address]    Culture   Final    MODERATE STREPTOCOCCUS GROUP G Beta hemolytic streptococci are predictably  susceptible to penicillin and other beta lactams. Susceptibility testing not routinely performed. MODERATE ACTINOMYCES SPECIES Standardized susceptibility testing for this organism is not available. ABUNDANT BACTEROIDES OVATUS BETA LACTAMASE POSITIVE MODERATE CANDIDA ALBICANS    Report Status 11/02/2022 FINAL  Final  Culture, blood (Routine X 2) w Reflex to ID Panel     Status: None   Collection Time: 11/03/22 10:18 AM   Specimen: BLOOD LEFT ARM  Result Value Ref Range Status   Specimen Description   Final    BLOOD LEFT ARM Performed at [facility], [address] [place], [place] [address]    Special Requests   Final    BOTTLES DRAWN AEROBIC ONLY Blood Culture adequate volume Performed at [facility], [address], [place], [place] [address]    Culture   Final  NO GROWTH 5 DAYS Performed at [facility], [address] [place], [place] [address]    Report Status 11/08/2022 FINAL  Final  Culture, blood (Routine X 2) w Reflex to ID Panel     Status: None   Collection Time: 11/03/22 10:20 AM   Specimen: BLOOD LEFT HAND  Result Value Ref Range Status   Specimen Description   Final    BLOOD LEFT HAND Performed at [facility], [address], [place], [place] [address]    Special Requests   Final    BOTTLES DRAWN AEROBIC ONLY Blood Culture results may not be optimal due to an inadequate volume of blood received in culture bottles Performed at [facility], [address], [place], [place] [address]    Culture   Final    NO GROWTH 5 DAYS Performed at [facility], [address] [place], [place] [address]    Report Status 11/08/2022 FINAL  Final    Labs: Results for orders placed or performed during the hospital encounter of 10/23/22 (from the past 48 hour(s))  Glucose, capillary     Status: Abnormal   Collection Time: 11/09/22  7:38 AM  Result Value Ref Range    Glucose-Capillary 150 (H) 70 - 99 mg/dL    Comment: Glucose reference range applies only to samples taken after fasting for at least 8 hours.  Glucose, capillary     Status: Abnormal   Collection Time: 11/09/22 11:09 AM  Result Value Ref Range   Glucose-Capillary 169 (H) 70 - 99 mg/dL    Comment: Glucose reference range applies only to samples taken after fasting for at least 8 hours.  Glucose, capillary     Status: Abnormal   Collection Time: 11/09/22  4:05 PM  Result Value Ref Range   Glucose-Capillary 180 (H) 70 - 99 mg/dL    Comment: Glucose reference range applies only to samples taken after fasting for at least 8 hours.  Glucose, capillary     Status: Abnormal   Collection Time: 11/09/22  7:31 PM  Result Value Ref Range   Glucose-Capillary 163 (H) 70 - 99 mg/dL    Comment: Glucose reference range applies only to samples taken after fasting for at least 8 hours.   Comment 1 Notify RN    Comment 2 Document in Chart   Glucose, capillary     Status: Abnormal   Collection Time: 11/09/22 11:20 PM  Result Value Ref Range   Glucose-Capillary 152 (H) 70 - 99 mg/dL    Comment: Glucose reference range applies only to samples taken after fasting for at least 8 hours.   Comment 1 Notify RN    Comment 2 Document in Chart   Glucose, capillary     Status: Abnormal   Collection Time: 11/10/22  3:24 AM  Result Value Ref Range   Glucose-Capillary 163 (H) 70 - 99 mg/dL    Comment: Glucose reference range applies only to samples taken after fasting for at least 8 hours.   Comment 1 Notify RN    Comment 2 Document in Chart   Basic metabolic panel     Status: Abnormal   Collection Time: 11/10/22  5:22 AM  Result Value Ref Range   Sodium 139 135 - 145 mmol/L   Potassium 4.1 3.5 - 5.1 mmol/L   Chloride 104 98 - 111 mmol/L   CO2 26 22 - 32 mmol/L   Glucose, Bld 161 (H) 70 - 99 mg/dL  Comment: Glucose reference range applies only to samples taken after fasting for at least 8 hours.   BUN 18 6  - 20 mg/dL   Creatinine, Ser 9.81 0.44 - 1.00 mg/dL   Calcium 8.3 (L) 8.9 - 10.3 mg/dL   GFR, Estimated >19 >14 mL/min    Comment: (NOTE) Calculated using the CKD-EPI Creatinine Equation (2021)    Anion gap 9 5 - 15    Comment: Performed at [facility], [address], [place], [place] [address]  Magnesium     Status: None   Collection Time: 11/10/22  5:22 AM  Result Value Ref Range   Magnesium 2.2 1.7 - 2.4 mg/dL    Comment: Performed at [facility], [address] [place], [place] [address]  Phosphorus     Status: None   Collection Time: 11/10/22  5:22 AM  Result Value Ref Range   Phosphorus 2.8 2.5 - 4.6 mg/dL    Comment: Performed at [facility], [address] [place], [place] [address]  Glucose, capillary     Status: Abnormal   Collection Time: 11/10/22  7:29 AM  Result Value Ref Range   Glucose-Capillary 173 (H) 70 - 99 mg/dL    Comment: Glucose reference range applies only to samples taken after fasting for at least 8 hours.  Glucose, capillary     Status: Abnormal   Collection Time: 11/10/22 11:46 AM  Result Value Ref Range   Glucose-Capillary 173 (H) 70 - 99 mg/dL    Comment: Glucose reference range applies only to samples taken after fasting for at least 8 hours.   Comment 1 Notify RN    Comment 2 Document in Chart   Glucose, capillary     Status: Abnormal   Collection Time: 11/10/22  3:49 PM  Result Value Ref Range   Glucose-Capillary 105 (H) 70 - 99 mg/dL    Comment: Glucose reference range applies only to samples taken after fasting for at least 8 hours.   Comment 1 Notify RN    Comment 2 Document in Chart   Glucose, capillary     Status: Abnormal   Collection Time: 11/10/22  7:33 PM  Result Value Ref Range   Glucose-Capillary 178 (H) 70 - 99 mg/dL    Comment: Glucose reference range applies only to samples taken after fasting for at least 8 hours.  Glucose, capillary     Status: Abnormal    Collection Time: 11/10/22 11:19 PM  Result Value Ref Range   Glucose-Capillary 144 (H) 70 - 99 mg/dL    Comment: Glucose reference range applies only to samples taken after fasting for at least 8 hours.  Comprehensive metabolic panel     Status: Abnormal   Collection Time: 11/11/22  5:00 AM  Result Value Ref Range   Sodium 135 135 - 145 mmol/L   Potassium 3.9 3.5 - 5.1 mmol/L   Chloride 102 98 - 111 mmol/L   CO2 26 22 - 32 mmol/L   Glucose, Bld 177 (H) 70 - 99 mg/dL    Comment: Glucose reference range applies only to samples taken after fasting for at least 8 hours.   BUN 20 6 - 20 mg/dL   Creatinine, Ser 4.69 0.44 - 1.00 mg/dL   Calcium 7.9 (L) 8.9 - 10.3 mg/dL   Total Protein 6.1 (L) 6.5 - 8.1 g/dL   Albumin 1.9 (L) 3.5 - 5.0 g/dL   AST 35 15 - 41 U/L   ALT 31  0 - 44 U/L   Alkaline Phosphatase 90 38 - 126 U/L   Total Bilirubin 0.2 (L) 0.3 - 1.2 mg/dL   GFR, Estimated >16 >10 mL/min    Comment: (NOTE) Calculated using the CKD-EPI Creatinine Equation (2021)    Anion gap 7 5 - 15    Comment: Performed at [facility], [address] [place], [place] [address]  Magnesium     Status: None   Collection Time: 11/11/22  5:00 AM  Result Value Ref Range   Magnesium 2.1 1.7 - 2.4 mg/dL    Comment: Performed at [facility], [address], [place], [place] [address]  Phosphorus     Status: None   Collection Time: 11/11/22  5:00 AM  Result Value Ref Range   Phosphorus 3.0 2.5 - 4.6 mg/dL    Comment: Performed at [facility], [address] [place], [place] [address]    Imaging / Studies: No results found.  Medications / Allergies: per chart  Antibiotics: Anti-infectives (From admission, onward)    Start     Dose/Rate Route Frequency Ordered Stop   11/05/22 1215  vancomycin (VANCOREADY) IVPB 1750 mg/350 mL  Status:  Discontinued        1,750 mg 175 mL/hr over 120 Minutes Intravenous Every 24 hours 11/04/22 1125  11/08/22 1023   11/04/22 1215  vancomycin (VANCOREADY) IVPB 2000 mg/400 mL        2,000 mg 200 mL/hr over 120 Minutes Intravenous STAT 11/04/22 1125 11/04/22 1402   11/04/22 1000  fluconazole (DIFLUCAN) IVPB 400 mg        400 mg 100 mL/hr over 120 Minutes Intravenous Every 24 hours 11/03/22 0801     11/03/22 0845  fluconazole (DIFLUCAN) IVPB 800 mg        800 mg 100 mL/hr over 240 Minutes Intravenous  Once 11/03/22 0759 11/03/22 1327   10/28/22 1000  azithromycin (ZITHROMAX) tablet 250 mg  Status:  Discontinued       See Hyperspace for full Linked Orders Report.   250 mg Oral Daily 10/27/22 0738 10/27/22 1653   10/27/22 1700  piperacillin-tazobactam (ZOSYN) IVPB 3.375 g        3.375 g 12.5 mL/hr over 240 Minutes Intravenous Every 8 hours 10/27/22 1653     10/27/22 1000  azithromycin (ZITHROMAX) tablet 500 mg       See Hyperspace for full Linked Orders Report.   500 mg Oral Daily 10/27/22 0738 10/27/22 0946   10/23/22 1054  ceFAZolin (ANCEF) 2-4 GM/100ML-% IVPB       Note to Pharmacy: Augustine Radar R: cabinet override      10/23/22 1054 10/23/22 2259   10/23/22 0600  ceFAZolin (ANCEF) IVPB 3g/100 mL premix        3 g 200 mL/hr over 30 Minutes Intravenous On call to O.R. 10/23/22 0540 10/23/22 1213         Note: Portions of this report may have been transcribed using voice recognition software. Every effort was made to ensure accuracy; however, inadvertent computerized transcription errors may be present.   Any transcriptional errors that result from this process are unintentional.    Ardeth Sportsman, MD, FACS, MASCRS Esophageal, Gastrointestinal & Colorectal Surgery Robotic and Minimally Invasive Surgery  [facility] [address] [place], [place] [address] [phone] Fax [phone] Main  CONTACT INFORMATION:  Weekday (9AM-5PM): Call [facility] main office at [phone]  Weeknight (  5PM-9AM) or  Weekend/Holiday: Check [email or web address] (password " [email or web address]") for General Surgery [facility] coverage  (Please, do not use SecureChat as it is not reliable communication to reach operating surgeons for immediate patient care given surgeries/outpatient duties/clinic/cross-coverage/off post-call which would lead to a delay in care.  Epic staff messaging available for outptient concerns, but may not be answered for 48 hours or more).     11/11/2022  7:34 AM

## 2022-11-11 NOTE — Progress Notes (Addendum)
PHARMACY - TOTAL PARENTERAL NUTRITION CONSULT NOTE   Indication: Prolonged ileus  Patient Measurements: Height: 5\' 5"  (165.1 cm) Weight: 107.2 kg (236 lb 5.3 oz) IBW/kg (Calculated) : 57 TPN AdjBW (KG): 71.4 Body mass index is 39.33 kg/m.  Assessment:  Pharmacy consulted to start TPN on a 57 yo pt with complicated robotic recurrent incisional hernia repair with mesh, bilateral posterior rectus myofascial release, bilateral transversus abdominis release and removal of previous polypropylene mesh with prolonged lyses of adhesions on 10/23/22. This was complicated by a bowel perforation requiring return to OR on 6/16, 6/18, 6/20, and 6/22.   Glucose / Insulin:  - A1c 8.5 (6/3) and takes PTA Metformin - Pt on mSSI q4h; Previous 24 hour insulin use: 9 units  (documented as not given twice, nursing unsure why) - CBGs have ranged 105-178 in past 24 hours (target <150) Electrolytes:  lytes WNL, corrected Ca 9.58 Renal: SCr stable, BUN WNL Hepatic: WNL, albumin remains low - TG 406 (6/24) - lipids out of TPN on 6/24 - TG 388 (6/27) remain significantly elevated but improved.  Continue to hold lipids Intake / Output; MIVF: no mIVF From 6/28 7am to 6/29 7am: - UOP: x7 unmeasured urine occurrences - Drains 45 mL - Stool: x3 occurrences - NG removed 6/27 GI Imaging: NA GI Surgeries / Procedures:  - 6/10 Robotic incisional hernia repair - 6/16 exploratory laparotomy, repair of small bowel perforation  - 6/18 exploratory laparotomy, wash out of abdomen and wound vac placement  - 6/20 exploratory laparotomy, wound wash out of abdomen and wound vac change  - 6/22 absorbable mesh closure of abdomen, application of wound vac   Central access: PICC  TPN start date: 11/01/22  Nutritional Goals: TPN rate is 100 mL/hr - meeting protein and kcal goals without lipids   kcal %  Protein 60 g/L, 144g 576 29.34  Dextrose 17%, 408g 1387.2 70.66  Lipids  -- --  Total  1963.2     RD  Assessment: Estimated Needs Total Energy Estimated Needs: 1900-2200 kcals Total Protein Estimated Needs: 115-145 grams Total Fluid Estimated Needs: >/= 1.9L  Current Nutrition:  - TPN - 6/27: Clear liquid diet ordered - 6/28: advance to dysphagia 1 diet  Plan:  - Continue TPN at goal rate 100 mL/hr - Continue NO lipids in TPN.  When TG > 400 mg/dL, IV fat emulsion should be limited to the provision of essential fatty acids once weekly.  - Electrolytes in TPN: no changes from previous day Na 20 mEq/L  K 42 mEq/L  Ca 4 mEq/L  Mg 1.5 mEq/L  Phos 15 mmol/L  Cl:Ac 1:1 - Add standard MVI and trace elements to TPN - Continue Moderate q4h SSI and adjust as needed  - Monitor TPN labs on Mon/Thurs at minimum.  Recheck TG on 7/1 - Follow up further diet progression  Thank you for allowing pharmacy to be a part of this patient's care.  Tacy Learn, PharmD Clinical Pharmacist 11/11/2022 7:46 AM

## 2022-11-11 NOTE — PMR Pre-admission (Signed)
PMR Admission Coordinator Pre-Admission Assessment  Patient: Kaitlyn Bray is an 57 y.o., female MRN: [id number] DOB: 05/13/1966 Height: 5\' 5"  (165.1 cm) Weight: 100.6 kg  Insurance Information HMO: yes    PPO:      PCP:      IPA:      80/20:      OTHER:  PRIMARY: [employer] [facility] Byars      Policy#: [id number]      Subscriber: patient CM Name: Alexia Freestone      Phone#: [phone]     Fax#: [phone] Pre-Cert#: [id number] auth for CIR admit 7/3 from [facility] with [employer] with updates due to fax listed above on 11/28/22      Employer:  Benefits:  Phone #:      Name:  Eff. Date: 05/15/22     Deduct: $0      Out of Pocket Max: $9400 (met $2290.18)      Life Max: n/a CIR: $2500/days 1-3      SNF: $2500/days 1-3 Outpatient:      Co-Pay: $80/visit Home Health:       Co-Pay: $80/visit DME: 50%     Co-Pay: 50% Providers: in-network SECONDARY:       Policy#:      Phone#:   Artist:       Phone#:   The Data processing manager" for patients in Inpatient Rehabilitation Facilities with attached "Privacy Act Statement-Health Care Records" was provided and verbally reviewed with: N/A  Emergency Contact Information Contact Information     Name Relation Home Work Mobile   [place] Spouse [phone]  [phone]   Meela, Manalili [phone]  [phone]   Sherryl Manges   [phone]   Redgie Grayer   [phone]       Current Medical History  Patient Admitting Diagnosis: debility d/t small bowel perforation History of Present Illness: Pt is a 57 year old female with medical hx significant for: obesity, HTN, diabetes. Pt presented to [facility] on 10/23/22 for planned robotic hernia repair by Dr. Dossie Der due to recurrent ventral hernia. Pt had fever and placed on 2L O2 on 6/11. Upgraded to regular diet on 6/13. Fevers returned on 6/13; started on Zosyn for possible UA. Copious dark brown, foul-smelling fluid burst through abdominal  wall and pt sent to OR emergently for exploratory laparotomy on 10/29/22. Pt required resection of small bowel perforation and primary anastomosis and excision of mesh with placement of wound VAC; abdomen left open. Pt remained intubated. Pt returned to OR for washout of abdomen and wound VAC change by Dr. Magnus Ivan on 10/31/22. Pt remained intubated. TPN started on 6/19. Pt returned to OR for washout of abdomen by Dr. Magnus Ivan on 11/02/22. Pt remained intubated. Infectious Disease consulted for polymicrobial infection (group G streptococci, actinomyecs, bacteroids ovatus beta lactamase positive and candida albicans). Pt started on Fluconazole. Pt returned to OR for closure of abdomen and application of wound VAC by Dr. Dossie Der on 11/04/22. Pt extubated to 2L nasal cannula on 11/08/22. Started on PO diet on 6/28. Therapy evaluations completed and CIR recommended d/t pt's deficits in functional mobility and inability to complete ADLs independently.     Patient's medical record from [facility] has been reviewed by the rehabilitation admission coordinator and physician.  Past Medical History  Past Medical History:  Diagnosis Date   Asthma    Complication of anesthesia    Heart murmur    Hernia    Hypertension    New onset type 2  diabetes mellitus (HCC) 09/15/2022   PONV (postoperative nausea and vomiting)    Pre-diabetes    S/P hernia repair 08/26/2021   SBO (small bowel obstruction) (HCC) 09/15/2021    Has the patient had major surgery during 100 days prior to admission? Yes  Family History   family history includes Alzheimer's disease in her maternal grandfather; COPD in her father; Dementia in her mother; Heart disease in her maternal grandfather, maternal grandmother, and mother; Hyperlipidemia in her mother; Hypertension in her mother; Mental illness in her father; Rheum arthritis in her father; Stroke in her mother; Uterine cancer in her paternal grandmother.  Current  Medications  Current Facility-Administered Medications:    0.9 %  sodium chloride infusion, 250 mL, Intravenous, Continuous, Abigail Miyamoto, MD, Last Rate: 10 mL/hr at 11/13/22 1600, Infusion Verify at 11/13/22 1600   acetaminophen (TYLENOL) tablet 1,000 mg, 1,000 mg, Oral, Q6H, Gross, Viviann Spare, MD, 1,000 mg at 11/16/22 0804   albuterol (PROVENTIL) (2.5 MG/3ML) 0.083% nebulizer solution 2.5 mg, 2.5 mg, Nebulization, Q6H PRN, Stechschulte, Hyman Hopes, MD   amoxicillin-clavulanate (AUGMENTIN) 875-125 MG per tablet 1 tablet, 1 tablet, Oral, Q12H, Daiva Eves, Lisette Grinder, MD, 1 tablet at 11/16/22 1610   ascorbic acid (VITAMIN C) tablet 500 mg, 500 mg, Oral, Daily, Gross, Viviann Spare, MD, 500 mg at 11/16/22 0804   bismuth subsalicylate (PEPTO BISMOL) 262 MG/15ML suspension 30 mL, 30 mL, Oral, Q8H PRN, Karie Soda, MD   Chlorhexidine Gluconate Cloth 2 % PADS 6 each, 6 each, Topical, Daily, Abigail Miyamoto, MD, 6 each at 11/15/22 0957   cyanocobalamin (VITAMIN B12) tablet 500 mcg, 500 mcg, Oral, Daily, Stechschulte, Hyman Hopes, MD, 500 mcg at 11/16/22 0802   enoxaparin (LOVENOX) injection 40 mg, 40 mg, Subcutaneous, Q24H, Cornett, Thomas, MD, 40 mg at 11/16/22 0801   famotidine (PEPCID) tablet 20 mg, 20 mg, Oral, BID, Karie Soda, MD, 20 mg at 11/16/22 0805   feeding supplement (ENSURE ENLIVE / ENSURE PLUS) liquid 237 mL, 237 mL, Oral, BID BM, Stechschulte, Hyman Hopes, MD, 237 mL at 11/15/22 1414   fluconazole (DIFLUCAN) tablet 400 mg, 400 mg, Oral, Daily, Daiva Eves, Lisette Grinder, MD, 400 mg at 11/16/22 9604   gabapentin (NEURONTIN) capsule 400 mg, 400 mg, Oral, TID, Karie Soda, MD, 400 mg at 11/16/22 0803   losartan (COZAAR) tablet 25-50 mg, 25-50 mg, Oral, Daily, 50 mg at 11/16/22 0802 **AND** hydrochlorothiazide (HYDRODIURIL) tablet 6.25-12.5 mg, 6.25-12.5 mg, Oral, Daily, Stechschulte, Hyman Hopes, MD, 12.5 mg at 11/16/22 5409   HYDROmorphone (DILAUDID) injection 0.5-2 mg, 0.5-2 mg, Intravenous, Q3H PRN, Karie Soda,  MD, 1 mg at 11/15/22 8119   insulin aspart (novoLOG) injection 0-15 Units, 0-15 Units, Subcutaneous, TID AC & HS, Stechschulte, Hyman Hopes, MD, 2 Units at 11/16/22 0805   lidocaine (XYLOCAINE) 4 % external solution, , Topical, Daily PRN, Stechschulte, Hyman Hopes, MD, Given at 11/08/22 1719   liver oil-zinc oxide (DESITIN) 40 % ointment, , Topical, BID, Karie Soda, MD, Given at 11/16/22 0806   loperamide (IMODIUM) capsule 2 mg, 2 mg, Oral, Once, Karie Soda, MD   meclizine (ANTIVERT) tablet 25 mg, 25 mg, Oral, TID PRN, Karie Soda, MD   methocarbamol (ROBAXIN) 500 mg in dextrose 5 % 50 mL IVPB, 500 mg, Intravenous, Q6H PRN, Stechschulte, Hyman Hopes, MD   nystatin (MYCOSTATIN/NYSTOP) topical powder, , Topical, BID, Janeece Riggers, NP, Given at 11/16/22 0807   ondansetron (ZOFRAN) injection 4 mg, 4 mg, Intravenous, Q6H PRN, Harriette Bouillon, MD, 4 mg at 10/30/22 [phone]  oxyCODONE (Oxy IR/ROXICODONE) immediate release tablet 10 mg, 10 mg, Oral, Q4H PRN, Stechschulte, Hyman Hopes, MD   oxyCODONE (Oxy IR/ROXICODONE) immediate release tablet 5 mg, 5 mg, Oral, Q4H PRN, Stechschulte, Hyman Hopes, MD, 5 mg at 11/11/22 0920   polycarbophil (FIBERCON) tablet 625 mg, 625 mg, Oral, BID, Karie Soda, MD, 625 mg at 11/16/22 0804   prochlorperazine (COMPAZINE) injection 10 mg, 10 mg, Intravenous, Q4H PRN, Cornett, Thomas, MD, 10 mg at 10/30/22 0507   simethicone (MYLICON) chewable tablet 80 mg, 80 mg, Per Tube, QID PRN, Abigail Miyamoto, MD   sodium chloride (OCEAN) 0.65 % nasal spray 1 spray, 1 spray, Each Nare, PRN, Henry Russel, MD, 1 spray at 11/09/22 2310   sodium chloride flush (NS) 0.9 % injection 10-40 mL, 10-40 mL, Intracatheter, Q12H, Abigail Miyamoto, MD, 10 mL at 11/15/22 2110   sodium chloride flush (NS) 0.9 % injection 10-40 mL, 10-40 mL, Intracatheter, PRN, Abigail Miyamoto, MD  Patients Current Diet:  Diet Order             Diet - low sodium heart healthy           Diet regular Room service appropriate?  Yes; Fluid consistency: Thin  Diet effective now                   Precautions / Restrictions Precautions Precautions: Fall, Other (comment) Precaution Comments: 2 jp drains; abdominal wound vac; abdominal precautions Restrictions Weight Bearing Restrictions: No   Has the patient had 2 or more falls or a fall with injury in the past year? No  Prior Activity Level Community (5-7x/wk): drives; gets out of house often  Prior Functional Level Self Care: Did the patient need help bathing, dressing, using the toilet or eating? Independent  Indoor Mobility: Did the patient need assistance with walking from room to room (with or without device)? Independent  Stairs: Did the patient need assistance with internal or external stairs (with or without device)? Independent  Functional Cognition: Did the patient need help planning regular tasks such as shopping or remembering to take medications? Independent  Patient Information Are you of Hispanic, Latino/a,or Spanish origin?: A. No, not of Hispanic, Latino/a, or Spanish origin What is your race?: A. White Do you need or want an interpreter to communicate with a doctor or health care staff?: 0. No  Patient's Response To:  Health Literacy and Transportation Is the patient able to respond to health literacy and transportation needs?: Yes Health Literacy - How often do you need to have someone help you when you read instructions, pamphlets, or other written material from your doctor or pharmacy?: Never In the past 12 months, has lack of transportation kept you from medical appointments or from getting medications?: No In the past 12 months, has lack of transportation kept you from meetings, work, or from getting things needed for daily living?: No  Journalist, newspaper / Equipment Home Assistive Devices/Equipment: None Home Equipment: Rollator (4 wheels), Shower seat  Prior Device Use: Indicate devices/aids used by the patient prior to  current illness, exacerbation or injury? None of the above  Current Functional Level Cognition  Overall Cognitive Status: Within Functional Limits for tasks assessed Current Attention Level: Selective Orientation Level: Oriented X4 General Comments: slow processing    Extremity Assessment (includes Sensation/Coordination)  Upper Extremity Assessment: Generalized weakness  Lower Extremity Assessment: Defer to PT evaluation    ADLs  Overall ADL's : Needs assistance/impaired Eating/Feeding: NPO Grooming: Wash/dry hands, Wash/dry face, Oral care,  Brushing hair, Set up, Supervision/safety, Sitting Upper Body Bathing: Minimal assistance, Sitting Lower Body Bathing: Maximal assistance, Sit to/from stand Upper Body Dressing : Minimal assistance, Sitting Lower Body Dressing: Total assistance Toilet Transfer: Moderate assistance, +2 for safety/equipment, Stand-pivot Toilet Transfer Details (indicate cue type and reason): simulated to recliner Toileting- Clothing Manipulation and Hygiene: Maximal assistance, Sit to/from stand Toileting - Clothing Manipulation Details (indicate cue type and reason): incontinenet BM Functional mobility during ADLs: Moderate assistance, +2 for physical assistance, Rolling walker (2 wheels) General ADL Comments: no BSC in present this session, requested BSC from nursing staff and infomred RN that pt can be assisted to [facility] with SPT mod A    Mobility  Overal bed mobility: Needs Assistance Bed Mobility: Supine to Sit Rolling: Min assist Sidelying to sit: Min assist, HOB elevated (increased time) General bed mobility comments: cues for log roll technique to maintain abdominal precuations    Transfers  Overall transfer level: Needs assistance Equipment used:  (STEADY) Transfers: Sit to/from Stand, Bed to chair/wheelchair/BSC Sit to Stand: +2 safety/equipment, From elevated surface, Min assist Bed to/from chair/wheelchair/BSC transfer type:: Step pivot Step  pivot transfers: Mod assist General transfer comment: pt able to pull to stand from EOB on STEADY lift with min A x 1 and min guard x 1 for safety, pt demonstrated ability to maintain static standing on lift for 2;47 with close S and min cues. pt tolerated transfer well to recliner and able to follow commands for safe and proper technique. PT provided nursing ed on use of STEADY lift to return to bed    Ambulation / Gait / Stairs / Wheelchair Mobility  Ambulation/Gait General Gait Details: NT    Posture / Balance Balance Overall balance assessment: Needs assistance Sitting-balance support: Feet supported Sitting balance-Leahy Scale: Fair Standing balance support: Bilateral upper extremity supported, Reliant on assistive device for balance, During functional activity Standing balance-Leahy Scale: Poor Standing balance comment: reliant on UE support.    Special needs/care consideration Continuous Drip IV  039% sodium chloride infusion, Oxygen 2L nasal cannula, Wound Vac abdomen/medial, Skin Surgical incision: abdomen; JP drains; Weeping: abdomen/right, lower, Ecchymosis: abdomen/bilateral; Erythema/Redness; back, buttocks/bilateral; Petechiae: back, leg/bilateral, Bladder incontinence, External urinary catheter and Diabetic management Novolog 0-15 units every 4 hours   Previous Home Environment (from acute therapy documentation) Living Arrangements: Spouse/significant other  Lives With: Spouse Available Help at Discharge: Family, Friend(s), Available 24 hours/day Type of Home: House Home Layout: One level Home Access: Ramped entrance Bathroom Shower/Tub: Engineer, manufacturing systems: Handicapped height Bathroom Accessibility: Yes How Accessible: Accessible via walker Home Care Services: No  Discharge Living Setting Plans for Discharge Living Setting: House (friend Arlene's house) Type of Home at Discharge: House Discharge Home Layout: One level Discharge Home Access: Stairs to  enter Entrance Stairs-Rails: Right, Left, Can reach both (Back steps have rails and front steps have no rails) Entrance Stairs-Number of Steps: 8 in back, 3 in front Discharge Bathroom Shower/Tub: Tub/shower unit Discharge Bathroom Toilet: Standard Discharge Bathroom Accessibility: Yes How Accessible: Accessible via walker Does the patient have any problems obtaining your medications?: No  Social/Family/Support Systems Anticipated Caregiver: Cira Rue (friend), Molly Maduro (husband) Anticipated Industrial/product designer Information: Arlene: [phone] Maduro: [phone] Caregiver Availability: 24/7 Discharge Plan Discussed with Primary Caregiver: Yes Is Caregiver In Agreement with Plan?: Yes Does Caregiver/Family have Issues with Lodging/Transportation while Pt is in Rehab?: No  Goals Patient/Family Goal for Rehab: PT/OT supervision household level mobility/ADLs, SLP n/a Expected length of stay: 14-18 days Additional Information: Discharge plan:  home to Arlene's house with 24/7 supervision from her.  Has supportive spouse, children, and in-laws as well Pt/Family Agrees to Admission and willing to participate: Yes Program Orientation Provided & Reviewed with Pt/Caregiver Including Roles  & Responsibilities: Yes  Barriers to Discharge: Insurance for SNF coverage, Decreased caregiver support  Decrease burden of Care through IP rehab admission: NA  Possible need for SNF placement upon discharge: Not anticipated  Patient Condition: I have reviewed medical records from [facility], spoken with CM, and friend Arlene. I discussed via phone for inpatient rehabilitation assessment.  Patient will benefit from ongoing PT and OT, can actively participate in 3 hours of therapy a day 5 days of the week, and can make measurable gains during the admission.  Patient will also benefit from the coordinated team approach during an Inpatient Acute Rehabilitation admission.  The patient will receive intensive  therapy as well as Rehabilitation physician, nursing, social worker, and care management interventions.  Due to bladder management, safety, skin/wound care, disease management, medication administration, pain management, and patient education the patient requires 24 hour a day rehabilitation nursing.  The patient is currently mod +2 with mobility and basic ADLs.  Discharge setting and therapy post discharge at home with home health is anticipated.  Patient has agreed to participate in the Acute Inpatient Rehabilitation Program and will admit today.  Preadmission Screen Completed By:  Domingo Pulse, 11/16/2022 9:11 AM ______________________________________________________________________   Discussed status with Dr. Wynn Banker on 11/16/22  at 9:11 AM  and received approval for admission today.  Admission Coordinator:  Domingo Pulse, CCC-SLP, time 9:11 AM Dorna Bloom 11/16/22    Assessment/Plan: Diagnosis:  Debility due to peritonitis  Does the need for close, 24 hr/day Medical supervision in concert with the patient's rehab needs make it unreasonable for this patient to be served in a less intensive setting? Yes Co-Morbidities requiring supervision/potential complications: morbid obesity . Diabetes  Due to bladder management, bowel management, safety, skin/wound care, disease management, medication administration, pain management, and patient education, does the patient require 24 hr/day rehab nursing? Yes Does the patient require coordinated care of a physician, rehab nurse, PT, OT, and SLP to address physical and functional deficits in the context of the above medical diagnosis(es)? Yes Addressing deficits in the following areas: balance, endurance, locomotion, strength, transferring, bowel/bladder control, bathing, dressing, feeding, grooming, toileting, and psychosocial support Can the patient actively participate in an intensive therapy program of at least 3 hrs of therapy 5 days a week?  Yes The potential for patient to make measurable gains while on inpatient rehab is good Anticipated functional outcomes upon discharge from inpatient rehab: supervision PT, supervision OT, n/a SLP Estimated rehab length of stay to reach the above functional goals is: 14-18d Anticipated discharge destination: Home 10. Overall Rehab/Functional Prognosis: good   MD Signature: Erick Colace M.D. [facility] Fellow Am Acad of Phys Med and Rehab Diplomate Am Board of Electrodiagnostic Med Fellow Am Board of Interventional Pain

## 2022-11-12 DIAGNOSIS — E440 Moderate protein-calorie malnutrition: Secondary | ICD-10-CM | POA: Insufficient documentation

## 2022-11-12 LAB — GLUCOSE, CAPILLARY
Glucose-Capillary: 146 mg/dL — ABNORMAL HIGH (ref 70–99)
Glucose-Capillary: 166 mg/dL — ABNORMAL HIGH (ref 70–99)
Glucose-Capillary: 153 mg/dL — ABNORMAL HIGH (ref 70–99)
Glucose-Capillary: 158 mg/dL — ABNORMAL HIGH (ref 70–99)
Glucose-Capillary: 152 mg/dL — ABNORMAL HIGH (ref 70–99)
Glucose-Capillary: 126 mg/dL — ABNORMAL HIGH (ref 70–99)

## 2022-11-12 LAB — COMPREHENSIVE METABOLIC PANEL
ALT: 33 U/L (ref 0–44)
AST: 30 U/L (ref 15–41)
Albumin: 2 g/dL — ABNORMAL LOW (ref 3.5–5.0)
Alkaline Phosphatase: 96 U/L (ref 38–126)
Anion gap: 6 (ref 5–15)
BUN: 21 mg/dL — ABNORMAL HIGH (ref 6–20)
CO2: 26 mmol/L (ref 22–32)
Calcium: 8.1 mg/dL — ABNORMAL LOW (ref 8.9–10.3)
Chloride: 106 mmol/L (ref 98–111)
Creatinine, Ser: 0.57 mg/dL (ref 0.44–1.00)
GFR, Estimated: >60 mL/min (ref >60)
Glucose, Bld: 146 mg/dL — ABNORMAL HIGH (ref 70–99)
Potassium: 3.8 mmol/L (ref 3.5–5.1)
Sodium: 138 mmol/L (ref 135–145)
Total Bilirubin: 0.3 mg/dL (ref 0.3–1.2)
Total Protein: 6.5 g/dL (ref 6.5–8.1)

## 2022-11-12 LAB — CBC
HCT: 27.7 % — ABNORMAL LOW (ref 36.0–46.0)
Hemoglobin: 8.2 g/dL — ABNORMAL LOW (ref 12.0–15.0)
MCH: 27.4 pg (ref 26.0–34.0)
MCHC: 29.6 g/dL — ABNORMAL LOW (ref 30.0–36.0)
MCV: 92.6 fL (ref 80.0–100.0)
Platelets: 479 10*3/uL — ABNORMAL HIGH (ref 150–400)
RBC: 2.99 MIL/uL — ABNORMAL LOW (ref 3.87–5.11)
RDW: 15.3 % (ref 11.5–15.5)
WBC: 14.2 10*3/uL — ABNORMAL HIGH (ref 4.0–10.5)
nRBC: 0.1 % (ref 0.0–0.2)

## 2022-11-12 LAB — MAGNESIUM: Magnesium: 2.3 mg/dL (ref 1.7–2.4)

## 2022-11-12 LAB — PHOSPHORUS: Phosphorus: 2.8 mg/dL (ref 2.5–4.6)

## 2022-11-12 LAB — PREALBUMIN: Prealbumin: 19 mg/dL (ref 18–38)

## 2022-11-12 MED ORDER — ZINC OXIDE 40 % EX OINT
TOPICAL_OINTMENT | Freq: Two times a day (BID) | CUTANEOUS | Status: DC
  Administered 2022-11-14: 1 via TOPICAL
  Filled 2022-11-12: qty 57

## 2022-11-12 MED ORDER — BISMUTH SUBSALICYLATE 262 MG/15ML PO SUSP
30.0000 mL | Freq: Three times a day (TID) | ORAL | Status: DC | PRN
  Filled 2022-11-12: qty 236

## 2022-11-12 MED ORDER — LOPERAMIDE HCL 2 MG PO CAPS: 2.0000 mg | ORAL_CAPSULE | Freq: Once | ORAL | Status: DC

## 2022-11-12 MED ORDER — LOPERAMIDE HCL 2 MG PO CAPS: 2.0000 mg | ORAL_CAPSULE | Freq: Three times a day (TID) | ORAL | Status: AC | PRN

## 2022-11-12 MED ORDER — CALCIUM POLYCARBOPHIL 625 MG PO TABS
625.0000 mg | ORAL_TABLET | Freq: Two times a day (BID) | ORAL | Status: DC
  Administered 2022-11-12 – 2022-11-16 (×9): 625 mg via ORAL
  Filled 2022-11-12 (×9): qty 1

## 2022-11-12 MED ORDER — HYDROMORPHONE HCL 1 MG/ML IJ SOLN
0.5000 mg | INTRAMUSCULAR | Status: DC | PRN
  Administered 2022-11-15: 1 mg via INTRAVENOUS
  Filled 2022-11-12: qty 1

## 2022-11-12 MED ORDER — TRAVASOL 10 % IV SOLN
INTRAVENOUS | Status: AC
  Filled 2022-11-12: qty 1440

## 2022-11-12 MED ORDER — LOPERAMIDE HCL 2 MG PO CAPS: 2.0000 mg | ORAL_CAPSULE | Freq: Every day | ORAL | Status: DC

## 2022-11-12 NOTE — Progress Notes (Signed)
PHARMACY - TOTAL PARENTERAL NUTRITION CONSULT NOTE   Indication: Prolonged ileus  Patient Measurements: Height: 5\' 5"  (165.1 cm) Weight: 107.2 kg (236 lb 5.3 oz) IBW/kg (Calculated) : 57 TPN AdjBW (KG): 71.4 Body mass index is 39.33 kg/m.  Assessment:  Pharmacy consulted to start TPN on a 57 yo pt with complicated robotic recurrent incisional hernia repair with mesh, bilateral posterior rectus myofascial release, bilateral transversus abdominis release and removal of previous polypropylene mesh with prolonged lyses of adhesions on 10/23/22. This was complicated by a bowel perforation requiring return to OR on 6/16, 6/18, 6/20, and 6/22.   Glucose / Insulin:  - A1c 8.5 (6/3) and takes PTA Metformin - Pt on mSSI q4h; Previous 24 hour insulin use: 15 units  - CBGs have ranged 144-172 in past 24 hours (target <150) - AM Glucose= 146 Electrolytes:  lytes WNL, note Magnesium trending up.  Corrected Ca= 9.7 Renal: SCr stable, BUN slightly elevated Hepatic: WNL, albumin remains low - TG 406 (6/24) - lipids out of TPN on 6/24 - TG 388 (6/27) remain significantly elevated but improved.  Continue to hold lipids Intake / Output; MIVF: no mIVF From 6/29 7am to 6/30 7am: - UOP: and x3 unmeasured urine occurrences - Drains 530 mL - Stool: x2 occurrences - NG removed 6/27 GI Imaging: NA GI Surgeries / Procedures:  - 6/10 Robotic incisional hernia repair - 6/16 exploratory laparotomy, repair of small bowel perforation  - 6/18 exploratory laparotomy, wash out of abdomen and wound vac placement  - 6/20 exploratory laparotomy, wound wash out of abdomen and wound vac change  - 6/22 absorbable mesh closure of abdomen, application of wound vac   Central access: PICC  TPN start date: 11/01/22  Nutritional Goals: TPN rate is 100 mL/hr - meeting protein and kcal goals without lipids   kcal %  Protein 60 g/L, 144g 576 29.34  Dextrose 17%, 408g 1387.2 70.66  Lipids  -- --  Total  1963.2      RD Assessment: Estimated Needs Total Energy Estimated Needs: 1900-2200 kcals Total Protein Estimated Needs: 115-145 grams Total Fluid Estimated Needs: >/= 1.9L  Current Nutrition:  - TPN - 6/27: Clear liquid diet ordered - 6/28: advance to dysphagia 1 diet (2 meals documented yesterday; 25-50% of meal)  Plan:  - Continue TPN at goal rate 100 mL/hr - Continue NO lipids in TPN today.  When TG > 400 mg/dL, IV fat emulsion should be limited to the provision of essential fatty acids once weekly. Re-evaluate TG 7/1. - Electrolytes in TPN:  Na 20 mEq/L  K 42 mEq/L  Ca 4 mEq/L  Mg 0 mEq/L  Phos 15 mmol/L  Cl:Ac 1:1 - Leave out Magnesium, due to upward trend, and past trends of mag. - Add standard MVI and trace elements to TPN - Continue Moderate q4h SSI and adjust as needed  - Monitor TPN labs on Mon/Thurs at minimum.  Recheck TG on 7/1 - Follow up further diet progression  Thank you for allowing pharmacy to be a part of this patient's care.  Tacy Learn, PharmD Clinical Pharmacist 11/12/2022 7:27 AM

## 2022-11-12 NOTE — Progress Notes (Signed)
11/12/2022  PABLA NEDD [id number] Jun 18, 1965  CARE TEAM: PCP: Mort Sawyers, FNP  Outpatient Care Team: Patient Care Team: Mort Sawyers, FNP as PCP - General (Family Medicine) Ccs, Md, MD (General Surgery)  Inpatient Treatment Team: Treatment Team: Attending Provider: Stechschulte, Hyman Hopes, MD; WOC Nurse: Vassie Loll, RN; Case Manager: Theodoro Kos, [name] P, CCC-SLP; Technician: Tenna Delaine, NT; Registered Nurse: Ulis Rias, RN; Utilization Review: Clydia Llano, RN; Registered Nurse: Duane Boston, RN; Social Worker: Anselm Pancoast, [place]   Problem List:   Principal Problem:   Small bowel perforation [facility]) Active Problems:   HTN (hypertension)   Asthma   Recurrent ventral hernia   Morbid obesity (HCC)   Polyarthralgia   New onset type 2 diabetes mellitus (HCC)   Peritonitis (HCC)   Endotracheally intubated   Acute respiratory failure (HCC)   Post-op pain   Sepsis due to Candida species [facility])   Physical deconditioning   11/04/2022  Procedure(s): ABSORBABLE MESH CLOSURE OF ABDOMEN APPLICATION OF WOUND VAC    Assessment [facility] Stay = 20 days) 8 Days Post-Op    10/23/2022 Postoperative Diagnosis: RECURRENT VENTRAL HERNIA              13cm tall by 13 cm wide based on preoperative CT   Surgical Procedure:  ROBOTIC RECURRENT INCISIONAL HERNIA REPAIR WITH MESH  BILATERAL TRANVERSUS ABDOMINUS MYOFACIAL RELEASE  BILATERAL POSTERIOR RECTUS MYOFACIAL RELEASE REMOVAL OF PREVIOUS INTRAPERITONEAL MESH TWO HOUR ROBOTIC LYSIS OF ADHESIONS   Operative Team Members: [name], MD - Primary   10/29/2022   Preoperative diagnosis: Status post robotic complex ventral hernia repair with component separation with mesh with drainage from abdominal wall foul-smelling and tachycardia   Postoperative diagnosis: Small bowel injury with perforation and Ory Elting contamination of abdominal cavity as well as subcutaneous space and mesh   Procedure:  Exploratory laparotomy with resection of small bowel perforation and primary anastomosis and excision of mesh with placement of ABThera wound VAC and open abdomen due to complete loss of domain and peritonitis with significant Jezebelle Ledwell contamination of the peritoneal and subcutaneous space   Surgeon: Harriette Bouillon, MD   10/31/2022  Pre-op Diagnosis: OPEN ABDOMEN     Post-op Diagnosis: SAME   Procedure(s): EXPLORATORY LAPAROTOMY  WASH OUT OF ABDOMEN  ABTHERA WOUND VAC PLACEMENT   Surgeon(s): Abigail Miyamoto, MD  11/02/2022  Pre-op Diagnosis: OPEN ABDOMEN     Post-op Diagnosis: SAME   Procedure(s): EXPLORATORY LAPAROTOMY  WASH OUT OF ABDOMEN ABTHERA WOUND VAC PLACEMENT   Surgeon(s): Abigail Miyamoto, MD  11/04/2022  Postoperative Diagnosis: Open abdomen    Surgical Procedure:  RETROMUSCULAR ABSORBABLE MESH BRIDGING CLOSURE OF ABDOMEN:  APPLICATION OF WOUND TO VAC 25CM X 15 CM X 4 CM LOWER MIDLINE ABDOMINAL WOUND   Operative Team Members:  Surgeon(s) and Role:    * Stechschulte, Hyman Hopes, MD - Primary    * Abigail Miyamoto, MD - Assisting   Findings: The remainder of the Bard soft mesh was removed from the abdomen, the bowel anastomosis appeared to be intact and there appeared to be no significant undrained fluid collections or other pelvis injury within the abdomen.  The abdomen was washed out and the abdomen was closed using Vicryl mesh to recreate the peritoneal sac and closed the posterior rectus fascia in the midline, and a 35 cm x 30 cm piece of phasic's mesh in the retromuscular space to bridge the abdomen closed with a wound VAC to close the 25 x 15  cm wound with 4 pieces of white sponge overlying the mesh 1 piece of black sponge over the white sponge and 125 mmHg of suction.   Plan: Assessment/Plan: Numerous operations: difficult robotic recurrent incisional hernia repair with mesh, bilateral posterior rectus myofascial release, bilateral transversus abdominis  release and removal of previous polypropylene mesh with prolonged lyses of adhesions on 10/23/22.   This was complicated by delayed bowel perforation requiring return to the operating room with washout, small bowel resection and ABTHERA placement on 10/29/22.    Returned to the OR for wash out 6/18 and 6/20.    Underwent bridging retromuscular Phasix mesh closure of the abdomen 11/04/22.   ID  -ID consult for polymicrobial infection - continue antibiotics -Fevers resolving. -Follow surgical drains.  Output tapering off.  Perhaps can remove later.  May be CAT scan first. -Having return of bowel function encouraging  low threshold for repeat CAT scan at discharge.  Higher risk for postoperative fluid collections given feculent peritonitis with reoperation but clinically improving and ileus resolving an encouraging sign.  Defer to Dr. Royanne Foots  Wound -  Vac change 6/27 with 3 pieces of white foam and 1 piece of black foam.  Holding seal.   Next vac change per wound ostomy team on Monday  FEN - on full TPN nutrition.  Holding lipids.   Cleared by speech therapy Tolerating dysphagia 1 diet.  Advance to solid diet.   Calorie counts.   See if can start tapering off TPN over the next 48 hours.   With loose Mobolaji diarrhea, most likely post ileus/obstructive and should improve.  FiberCon twice daily.  Imodium times months.  Pepto PRN  PT/OT  Transition from intravenous to oral medications especially pain control.  Lower to step down level of care.  Transfer to floor tomorrow if improving.  Diabetes.  Placed on a sliding scale insulin Transition Back to Oral Hypoglycemics and.  Hypertension.  Chronically Elevated.  Restarted Losartan/Hydrochlorothiazide Remmers to hold if gets hypotensive.  History of Some Heartburn Reflux.  Famotidine for Now and Regroup.  Asthma.  Inhalers following.  Pulmonary toilet. Follow sats.   Dispo plans for rehab likely -evaluation done yesterday and  tentatively a candidate for inpatient rehab., will need wound vac care, will need plan in place for antibiotics, will need significant therapy. If can advance diet and wean off TPN, probably discharge next week.   -VTE prophylaxis- SCDs, etc  -mobilize as tolerated to help recovery  -Disposition:  Disposition:  The patient is from: Home Anticipate discharge to:  Inpatient Rehab (CIR) Anticipated Date of Discharge is:  July 2,2024   Barriers to discharge:  Consultant clearance & sign off  , Social/Financial Barriers, Therapy assessment & Recommendations pending, Need for inpatient procedure/study, and Pending Clinical improvement (more likely than not)  Patient currently is NOT MEDICALLY STABLE for discharge from the hospital from a surgery standpoint.      I reviewed nursing notes, Consultant ID notes, hospitalist notes, last 24 h vitals and pain scores, last 48 h intake and output, last 24 h labs and trends, and last 24 h imaging results.  I have reviewed this patient's available data, including medical history, events of note, test results, etc as part of my evaluation.   A significant portion of that time was spent in counseling. Care during the described time interval was provided by me.  This care required high  level of medical decision making.  11/12/2022    Subjective: (Chief complaint)  Patient  with a lot of loose bowel movements and some maceration.  Tolerated thicker liquids.  No nausea or vomiting.  Pain better controlled.  Motivated to get up and does not work with rehab.  Objective:  Vital signs:  Vitals:   11/12/22 0500 11/12/22 0600 11/12/22 0700 11/12/22 0800  BP: (!) 143/71 138/84 134/65 132/69  Pulse: 77 84 86 81  Resp: (!) 21 17 (!) 24 19  Temp:    97.7 F (36.5 C)  TempSrc:    Oral  SpO2: 97% 97% 94% 97%  Weight: 107.2 kg     Height:        Last BM Date : 11/12/22  Intake/Output   Yesterday:  06/29 0701 - 06/30 0700 In: 4692.1  [P.O.:600; I.V.:3680.7; IV Piggyback:411.4] Out: 3080 [Urine:2550; Drains:530] This shift:  Total I/O In: 140 [I.V.:140] Out: -   Bowel function:  Flatus: YES  BM:  YES  Drain: Serosanguinous   Physical Exam:  General: Pt awake/alert in no acute distress nontoxic.  Not toxic nor sickly.   Eyes: PERRL, normal EOM.  Sclera clear.  No icterus Neuro: CN II-XII intact w/o focal sensory/motor deficits. Lymph: No head/neck/groin lymphadenopathy Psych:  No delerium/psychosis/paranoia.  Oriented x 4 HENT: Normocephalic, Mucus membranes moist.  No thrush Neck: Supple, No tracheal deviation.  No obvious thyromegaly Chest: No pain to chest wall compression.  Good respiratory excursion.  No audible wheezing CV:  Pulses intact.  Regular rhythm.  No major extremity edema MS: Normal AROM mjr joints.  No obvious deformity  Abdomen: Soft.  Nondistended.  Mildly tender at incisions only.  Large wound VAC in place with serosanguineous drainage.  No evidence of peritonitis.  No incarcerated hernias.  Ext:   No deformity.  No mjr edema.  No cyanosis Skin: No petechiae / purpurea.  No major sores.  Warm and dry    Results:   Cultures: Recent Results (from the past 720 hour(s))  MRSA Next Gen by PCR, Nasal     Status: None   Collection Time: 10/29/22  4:49 PM   Specimen: Nasal Mucosa; Nasal Swab  Result Value Ref Range Status   MRSA by PCR Next Gen NOT DETECTED NOT DETECTED Final    Comment: (NOTE) The GeneXpert MRSA Assay (FDA approved for NASAL specimens only), is one component of a comprehensive MRSA colonization surveillance program. It is not intended to diagnose MRSA infection nor to guide or monitor treatment for MRSA infections. Test performance is not FDA approved in patients less than 55 years old. Performed at [facility], [address], [place], [place] [address]   Aerobic/Anaerobic Culture w Gram Stain (surgical/deep wound)     Status: None   Collection  Time: 10/29/22  6:33 PM   Specimen: Path fluid; Body Fluid  Result Value Ref Range Status   Specimen Description FLUID ABDOMEN  Final   Special Requests SWAB ID A  Final   Gram Stain   Final    NO WBC SEEN FEW GRAM NEGATIVE RODS RARE GRAM POSITIVE RODS RARE GRAM POSITIVE COCCI Performed at [facility], [address] [place], [place] [address]    Culture   Final    MODERATE STREPTOCOCCUS GROUP G Beta hemolytic streptococci are predictably susceptible to penicillin and other beta lactams. Susceptibility testing not routinely performed. MODERATE ACTINOMYCES SPECIES Standardized susceptibility testing for this organism is not available. ABUNDANT BACTEROIDES OVATUS BETA LACTAMASE POSITIVE MODERATE CANDIDA ALBICANS    Report Status 11/02/2022 FINAL  Final  Culture, blood (Routine X 2) w Reflex to ID Panel     Status: None   Collection Time: 11/03/22 10:18 AM   Specimen: BLOOD LEFT ARM  Result Value Ref Range Status   Specimen Description   Final    BLOOD LEFT ARM Performed at [facility], [address], [place], [place] [address]    Special Requests   Final    BOTTLES DRAWN AEROBIC ONLY Blood Culture adequate volume Performed at [facility], [address] [place], [place] [address]    Culture   Final    NO GROWTH 5 DAYS Performed at [facility], [address] [place], [place] [address]    Report Status 11/08/2022 FINAL  Final  Culture, blood (Routine X 2) w Reflex to ID Panel     Status: None   Collection Time: 11/03/22 10:20 AM   Specimen: BLOOD LEFT HAND  Result Value Ref Range Status   Specimen Description   Final    BLOOD LEFT HAND Performed at [facility], [address], [place], [place] [address]    Special Requests   Final    BOTTLES DRAWN AEROBIC ONLY Blood Culture results may not be optimal due to an inadequate volume of blood received in culture bottles Performed at [facility], [address] [place], [place] [address]    Culture   Final    NO GROWTH 5 DAYS Performed at [facility], [address] [place], [place] [address]    Report Status 11/08/2022 FINAL  Final    Labs: Results for orders placed or performed during the hospital encounter of 10/23/22 (from the past 48 hour(s))  Glucose, capillary     Status: Abnormal   Collection Time: 11/10/22 11:46 AM  Result Value Ref Range   Glucose-Capillary 173 (H) 70 - 99 mg/dL    Comment: Glucose reference range applies only to samples taken after fasting for at least 8 hours.   Comment 1 Notify RN    Comment 2 Document in Chart   Glucose, capillary     Status: Abnormal   Collection Time: 11/10/22  3:49 PM  Result Value Ref Range   Glucose-Capillary 105 (H) 70 - 99 mg/dL    Comment: Glucose reference range applies only to samples taken after fasting for at least 8 hours.   Comment 1 Notify RN    Comment 2 Document in Chart   Glucose, capillary     Status: Abnormal   Collection Time: 11/10/22  7:33 PM  Result Value Ref Range   Glucose-Capillary 178 (H) 70 - 99 mg/dL    Comment: Glucose reference range applies only to samples taken after fasting for at least 8 hours.  Glucose, capillary     Status: Abnormal   Collection Time: 11/10/22 11:19 PM  Result Value Ref Range   Glucose-Capillary 144 (H) 70 - 99 mg/dL    Comment: Glucose reference range applies only to samples taken after fasting for at least 8 hours.  Comprehensive metabolic panel     Status: Abnormal   Collection Time: 11/11/22  5:00 AM  Result Value Ref Range   Sodium 135 135 - 145 mmol/L   Potassium 3.9 3.5 - 5.1 mmol/L   Chloride 102 98 - 111 mmol/L   CO2 26 22 - 32 mmol/L   Glucose, Bld 177 (H) 70 - 99 mg/dL    Comment: Glucose reference range applies only to samples taken  after fasting for at least 8 hours.   BUN 20 6 - 20 mg/dL   Creatinine, Ser 1.61 0.44 - 1.00 mg/dL   Calcium 7.9 (L) 8.9 - 10.3 mg/dL    Total Protein 6.1 (L) 6.5 - 8.1 g/dL   Albumin 1.9 (L) 3.5 - 5.0 g/dL   AST 35 15 - 41 U/L   ALT 31 0 - 44 U/L   Alkaline Phosphatase 90 38 - 126 U/L   Total Bilirubin 0.2 (L) 0.3 - 1.2 mg/dL   GFR, Estimated >09 >60 mL/min    Comment: (NOTE) Calculated using the CKD-EPI Creatinine Equation (2021)    Anion gap 7 5 - 15    Comment: Performed at [facility], [address] [place], [place] [address]  Magnesium     Status: None   Collection Time: 11/11/22  5:00 AM  Result Value Ref Range   Magnesium 2.1 1.7 - 2.4 mg/dL    Comment: Performed at [facility], [address] [place], [place] [address]  Phosphorus     Status: None   Collection Time: 11/11/22  5:00 AM  Result Value Ref Range   Phosphorus 3.0 2.5 - 4.6 mg/dL    Comment: Performed at [facility], [address] [place], [place] [address]  Glucose, capillary     Status: Abnormal   Collection Time: 11/11/22  7:56 AM  Result Value Ref Range   Glucose-Capillary 170 (H) 70 - 99 mg/dL    Comment: Glucose reference range applies only to samples taken after fasting for at least 8 hours.   Comment 1 Notify RN    Comment 2 Document in Chart   Glucose, capillary     Status: Abnormal   Collection Time: 11/11/22 12:41 PM  Result Value Ref Range   Glucose-Capillary 168 (H) 70 - 99 mg/dL    Comment: Glucose reference range applies only to samples taken after fasting for at least 8 hours.   Comment 1 Notify RN    Comment 2 Document in Chart   Glucose, capillary     Status: Abnormal   Collection Time: 11/11/22  3:50 PM  Result Value Ref Range   Glucose-Capillary 150 (H) 70 - 99 mg/dL    Comment: Glucose reference range applies only to samples taken after fasting for at least 8 hours.  Glucose, capillary     Status: Abnormal   Collection Time: 11/11/22  7:32 PM  Result Value Ref Range   Glucose-Capillary 172 (H) 70 - 99 mg/dL    Comment: Glucose reference range applies  only to samples taken after fasting for at least 8 hours.   Comment 1 Notify RN    Comment 2 Document in Chart   Glucose, capillary     Status: Abnormal   Collection Time: 11/11/22 11:30 PM  Result Value Ref Range   Glucose-Capillary 144 (H) 70 - 99 mg/dL    Comment: Glucose reference range applies only to samples taken after fasting for at least 8 hours.   Comment 1 Notify RN    Comment 2 Document in Chart   Glucose, capillary     Status: Abnormal   Collection Time: 11/12/22  3:37 AM  Result Value Ref Range   Glucose-Capillary 146 (H) 70 - 99 mg/dL    Comment: Glucose reference range applies only to samples taken after fasting for at least 8 hours.   Comment 1 Notify RN    Comment 2 Document in Chart  CBC     Status: Abnormal   Collection Time: 11/12/22  5:00 AM  Result Value Ref Range   WBC 14.2 (H) 4.0 - 10.5 K/uL   RBC 2.99 (L) 3.87 - 5.11 MIL/uL   Hemoglobin 8.2 (L) 12.0 - 15.0 g/dL   HCT 16.1 (L) 09.6 - 04.5 %   MCV 92.6 80.0 - 100.0 fL   MCH 27.4 26.0 - 34.0 pg   MCHC 29.6 (L) 30.0 - 36.0 g/dL   RDW 40.9 81.1 - 91.4 %   Platelets 579 (H) 150 - 400 K/uL   nRBC 0.1 0.0 - 0.2 %    Comment: Performed at [facility], [address] [place], [place] [address]  Magnesium     Status: None   Collection Time: 11/12/22  5:00 AM  Result Value Ref Range   Magnesium 2.3 1.7 - 2.4 mg/dL    Comment: Performed at [facility], [address], [place], [place] [address]  Phosphorus     Status: None   Collection Time: 11/12/22  5:00 AM  Result Value Ref Range   Phosphorus 2.8 2.5 - 4.6 mg/dL    Comment: Performed at [facility], [address], [place], [place] [address]  Comprehensive metabolic panel     Status: Abnormal   Collection Time: 11/12/22  5:00 AM  Result Value Ref Range   Sodium 138 135 - 145 mmol/L   Potassium 3.8 3.5 - 5.1 mmol/L   Chloride 106 98 - 111 mmol/L   CO2 26 22 - 32 mmol/L   Glucose, Bld 146 (H)  70 - 99 mg/dL    Comment: Glucose reference range applies only to samples taken after fasting for at least 8 hours.   BUN 21 (H) 6 - 20 mg/dL   Creatinine, Ser 4.69 0.44 - 1.00 mg/dL   Calcium 8.1 (L) 8.9 - 10.3 mg/dL   Total Protein 6.5 6.5 - 8.1 g/dL   Albumin 2.0 (L) 3.5 - 5.0 g/dL   AST 30 15 - 41 U/L   ALT 33 0 - 44 U/L   Alkaline Phosphatase 96 38 - 126 U/L   Total Bilirubin 0.3 0.3 - 1.2 mg/dL   GFR, Estimated >62 >95 mL/min    Comment: (NOTE) Calculated using the CKD-EPI Creatinine Equation (2021)    Anion gap 6 5 - 15    Comment: Performed at [facility], [address], [place], [place] [address]  Glucose, capillary     Status: Abnormal   Collection Time: 11/12/22  8:21 AM  Result Value Ref Range   Glucose-Capillary 166 (H) 70 - 99 mg/dL    Comment: Glucose reference range applies only to samples taken after fasting for at least 8 hours.    Imaging / Studies: No results found.  Medications / Allergies: per chart  Antibiotics: Anti-infectives (From admission, onward)    Start     Dose/Rate Route Frequency Ordered Stop   11/05/22 1215  vancomycin (VANCOREADY) IVPB 1750 mg/350 mL  Status:  Discontinued        1,750 mg 175 mL/hr over 120 Minutes Intravenous Every 24 hours 11/04/22 1125 11/08/22 1023   11/04/22 1215  vancomycin (VANCOREADY) IVPB 2000 mg/400 mL        2,000 mg 200 mL/hr over 120 Minutes Intravenous STAT 11/04/22 1125 11/04/22 1402   11/04/22 1000  fluconazole (DIFLUCAN) IVPB 400 mg        400 mg 100 mL/hr over 120 Minutes  Intravenous Every 24 hours 11/03/22 0801     11/03/22 0845  fluconazole (DIFLUCAN) IVPB 800 mg        800 mg 100 mL/hr over 240 Minutes Intravenous  Once 11/03/22 0759 11/03/22 1327   10/28/22 1000  azithromycin (ZITHROMAX) tablet 250 mg  Status:  Discontinued       See Hyperspace for full Linked Orders Report.   250 mg Oral Daily 10/27/22 0738 10/27/22 1653   10/27/22 1700  piperacillin-tazobactam (ZOSYN) IVPB  3.375 g        3.375 g 12.5 mL/hr over 240 Minutes Intravenous Every 8 hours 10/27/22 1653     10/27/22 1000  azithromycin (ZITHROMAX) tablet 500 mg       See Hyperspace for full Linked Orders Report.   500 mg Oral Daily 10/27/22 0738 10/27/22 0946   10/23/22 1054  ceFAZolin (ANCEF) 2-4 GM/100ML-% IVPB       Note to Pharmacy: Augustine Radar R: cabinet override      10/23/22 1054 10/23/22 2259   10/23/22 0600  ceFAZolin (ANCEF) IVPB 3g/100 mL premix        3 g 200 mL/hr over 30 Minutes Intravenous On call to O.R. 10/23/22 0540 10/23/22 1213         Note: Portions of this report may have been transcribed using voice recognition software. Every effort was made to ensure accuracy; however, inadvertent computerized transcription errors may be present.   Any transcriptional errors that result from this process are unintentional.    Ardeth Sportsman, MD, FACS, MASCRS Esophageal, Gastrointestinal & Colorectal Surgery Robotic and Minimally Invasive Surgery  [facility] [address], [address] [place], [place] [address] [phone] Fax [phone] Main  CONTACT INFORMATION:  Weekday (9AM-5PM): Call [facility] main office at [phone]  Weeknight (5PM-9AM) or Weekend/Holiday: Check [email or web address] (password " [email or web address]") for General Surgery [facility] coverage  (Please, do not use SecureChat as it is not reliable communication to reach operating surgeons for immediate patient care given surgeries/outpatient duties/clinic/cross-coverage/off post-call which would lead to a delay in care.  Epic staff messaging available for outptient concerns, but may not be answered for 48 hours or more).     11/12/2022  10:49 AM

## 2022-11-12 NOTE — Progress Notes (Signed)
Inpatient Rehab Admissions Coordinator:  Attempted to contact pt's husband. Received no answer. Not able to leave a message. Will continue to follow.   Wolfgang Phoenix, MS, CCC-SLP Admissions Coordinator [phone]

## 2022-11-13 DIAGNOSIS — M2550 Pain in unspecified joint: Secondary | ICD-10-CM

## 2022-11-13 DIAGNOSIS — Z794 Long term (current) use of insulin: Secondary | ICD-10-CM

## 2022-11-13 DIAGNOSIS — E119 Type 2 diabetes mellitus without complications: Secondary | ICD-10-CM

## 2022-11-13 DIAGNOSIS — T8143XA Infection following a procedure, organ and space surgical site, initial encounter: Secondary | ICD-10-CM

## 2022-11-13 DIAGNOSIS — Z7984 Long term (current) use of oral hypoglycemic drugs: Secondary | ICD-10-CM

## 2022-11-13 LAB — COMPREHENSIVE METABOLIC PANEL
ALT: 28 U/L (ref 0–44)
AST: 24 U/L (ref 15–41)
Albumin: 1.9 g/dL — ABNORMAL LOW (ref 3.5–5.0)
Alkaline Phosphatase: 101 U/L (ref 38–126)
Anion gap: 10 (ref 5–15)
BUN: 21 mg/dL — ABNORMAL HIGH (ref 6–20)
CO2: 24 mmol/L (ref 22–32)
Calcium: 8.4 mg/dL — ABNORMAL LOW (ref 8.9–10.3)
Chloride: 103 mmol/L (ref 98–111)
Creatinine, Ser: 0.52 mg/dL (ref 0.44–1.00)
GFR, Estimated: >60 mL/min (ref >60)
Glucose, Bld: 149 mg/dL — ABNORMAL HIGH (ref 70–99)
Potassium: 3.9 mmol/L (ref 3.5–5.1)
Sodium: 137 mmol/L (ref 135–145)
Total Bilirubin: 0.2 mg/dL — ABNORMAL LOW (ref 0.3–1.2)
Total Protein: 6.5 g/dL (ref 6.5–8.1)

## 2022-11-13 LAB — GLUCOSE, CAPILLARY
Glucose-Capillary: 153 mg/dL — ABNORMAL HIGH (ref 70–99)
Glucose-Capillary: 138 mg/dL — ABNORMAL HIGH (ref 70–99)
Glucose-Capillary: 149 mg/dL — ABNORMAL HIGH (ref 70–99)
Glucose-Capillary: 148 mg/dL — ABNORMAL HIGH (ref 70–99)
Glucose-Capillary: 151 mg/dL — ABNORMAL HIGH (ref 70–99)

## 2022-11-13 LAB — MAGNESIUM: Magnesium: 1.9 mg/dL (ref 1.7–2.4)

## 2022-11-13 LAB — TRIGLYCERIDES: Triglycerides: 302 mg/dL — ABNORMAL HIGH (ref <150)

## 2022-11-13 LAB — PHOSPHORUS: Phosphorus: 2.9 mg/dL (ref 2.5–4.6)

## 2022-11-13 MED ORDER — ENSURE ENLIVE PO LIQD
237.0000 mL | Freq: Two times a day (BID) | ORAL | Status: DC
  Administered 2022-11-13 – 2022-11-16 (×5): 237 mL via ORAL

## 2022-11-13 MED ORDER — TRAVASOL 10 % IV SOLN
INTRAVENOUS | Status: DC
  Filled 2022-11-13: qty 1440

## 2022-11-13 NOTE — Progress Notes (Signed)
PHARMACY - TOTAL PARENTERAL NUTRITION CONSULT NOTE   Indication: Prolonged ileus  Patient Measurements: Height: 5\' 5"  (165.1 cm) Weight: 107.2 kg (236 lb 5.3 oz) IBW/kg (Calculated) : 57 TPN AdjBW (KG): 71.4 Body mass index is 39.33 kg/m.  Assessment:  Pharmacy consulted to start TPN on a 57 yo pt with complicated robotic recurrent incisional hernia repair with mesh, bilateral posterior rectus myofascial release, bilateral transversus abdominis release and removal of previous polypropylene mesh with prolonged lyses of adhesions on 10/23/22. This was complicated by a bowel perforation requiring return to OR on 6/16, 6/18, 6/20, and 6/22.   Glucose / Insulin:  - A1c 8.5 (6/3) and takes PTA Metformin - Pt on mSSI q4h; Previous 24 hour insulin use: 15 units  - CBGs have ranged 126-166 in past 24 hours (target <150) Electrolytes: WNL, CorrCa 10.08 Renal: SCr stable, BUN slightly elevated Hepatic: WNL, Tbili low 0.2, albumin remains low - TG 406 (6/24) - lipids out of TPN on 6/24; TG 388 (6/27), TG decreased to 302 (7/1).  Resume lipids in TPN.  Intake / Output; MIVF: no mIVF - UOP: unmeasured.  Stool occurrences not documented but RN reports diarrhea is improving.  - Drain output not documented but MD notes output slowing.   GI Imaging: NA GI Surgeries / Procedures:  - 6/10 Robotic incisional hernia repair - 6/16 exploratory laparotomy, repair of small bowel perforation  - 6/18 exploratory laparotomy, wash out of abdomen and wound vac placement  - 6/20 exploratory laparotomy, wound wash out of abdomen and wound vac change  - 6/22 absorbable mesh closure of abdomen, application of wound vac   Central access: PICC  TPN start date: 11/01/22  Nutritional Goals: TPN rate is 100 mL/hr  Energy Contribution   kcal %  Protein 144g 576 25.26  Dextrose 15% 1,224 53.69  Lipids 20 g/L  480 21.05  Total 21.27 2,280      RD Assessment: Estimated Needs Total Energy Estimated Needs:  1900-2200 kcals Total Protein Estimated Needs: 115-145 grams Total Fluid Estimated Needs: >/= 1.9L  Current Nutrition:  - TPN - 6/30 advanced to soft diet (meal intake not recorded, but RN reports ~50% of 3 meals yesterday)  Plan:  - Continue TPN at goal rate 100 mL/hr, reformulate to resume lipids in TPN. - Electrolytes in TPN:  Na 20 mEq/L  K 45 mEq/L  Ca 4 mEq/L  Mg 2 mEq/L Phos 15 mmol/L  Cl:Ac 1:1 - Add standard MVI and trace elements to TPN - Continue Moderate q4h SSI and adjust as needed  - Monitor TPN labs on Mon/Thurs at minimum.   - Follow up further diet progression and plans to wean off TPN as intake improves.   Thank you for allowing pharmacy to be a part of this patient's care.  Lynann Beaver PharmD, BCPS [facility] main pharmacy [phone] 11/13/2022 7:14 AM

## 2022-11-13 NOTE — Consult Note (Signed)
WOC Nurse Consult Note: Reason for Consult: Routine NPWT dressing change over open abdominal wound with exposed mesh Wound type:surgical Pressure Injury POA: N/A Measurement: 18.5cm x 11.5cm x 2cm with undermining from 3-6 o'clock and from 7-11 o'clock measuring >7cm Wound bed: with white mesh closure of abdomen on 11/04/22 Drainage (amount, consistency, odor) serosanguinous in tubing and cannister Periwound: Intact Dressing procedure/placement/frequency: Dressing removed using medical adhesive remover spray. 1 piece of black foam and 3 pieces white foam removed.I am unable to visualize any remaining pieces of foam. Wound measurements taken and wound explored with cotton tipped applicator. Three pieces of white foam used to fill majority of defect and undermining, I do not completley fill dead space from 3-6 o'clock. One piece of black foam is placed over the white foam. Drape is used to seal dressing, dressing is attached to continuous negative pressure and an immediate seal is achieved. Patent tolerated procedure well and did not require premedication or post procedure medication.  I was assisted in this dressing change by the patient's Bedside RN and an orienting RN.  Their assistance is appreciated.  Supplies in the room for future dressing changes include 2 large and 1 Medium Black foam dressing kits, 4 packages of white foam.  Of note: that patient was complaining of itching proximal to the sacrum. Sacral dressing removed to assess sacrum and area of itching. No disruption in skin integrity is noted upon assessment. Patients skin is cleanse and dried and she is positioned off of the supine position. DermaTherapy bedlinen is in place.   Prevalon Boots are provided for use while in bed. A pressure redistribution chair cushion is provided for OOB use and is to be sent with patient at discharge.  WOC nursing team will follow, and will remain available to this patient, the nursing and  medical teams.   Thank you for inviting [place] to participate in this patient's Plan of Care.  Ladona Mow, MSN, RN, CNS, GNP, Leda Min, [employer], [employer] phone:  [phone]

## 2022-11-13 NOTE — Progress Notes (Signed)
Physical Therapy Treatment Patient Details Name: BREKYN OGLE MRN: [id number] DOB: 27-Apr-1966 Today's Date: 11/13/2022   History of Present Illness Patient is a 57 year old female who underwent ventral hernia s/p robotic incisional repair on 6/10 complicated by bowel perforation, sepsis, and peritonitis s/p ex lap on 6/16 and wash out on 6/18, 6/20 with closing of the abdomen on 6/22. Patient was extubated on 6/26. PMH: obesity, diabetes, HTN    PT Comments  The patient  demonstrates improved bed mobility and required less assistance to sit upright.  Patient reporting  Leg pain when standing and limited tolerance, Corene Cornea lift equipment used to safely transfer to recliner.  Patient will benefit from  further  PT at DC.     Assistance Recommended at Discharge Frequent or constant Supervision/Assistance  If plan is discharge home, recommend the following:  Can travel by private vehicle    Two people to help with walking and/or transfers;A lot of help with bathing/dressing/bathroom;Assistance with cooking/housework;Assist for transportation;Help with stairs or ramp for entrance      Equipment Recommendations       Recommendations for Other Services Rehab consult     Precautions / Restrictions Precautions Precautions: Fall;Other (comment) Precaution Comments: 2 jp drains; abdominal wound vac; abdominal precautions Restrictions Weight Bearing Restrictions: No     Mobility  Bed Mobility Overal bed mobility: Needs Assistance Bed Mobility: Supine to Sit, Rolling Rolling: Min assist         General bed mobility comments: assist trunk to rise, cues for abdominal precautions    Transfers Overall transfer level: Needs assistance   Transfers: Sit to/from Stand, Bed to chair/wheelchair/BSC Sit to Stand: Max assist, +2 safety/equipment, From elevated surface           General transfer comment: assist to stand from bed x 1, patient reporting legs painful. Attempted again,  able to stand for  STEDY flaps to drop, then stood with min asst  after rolled to recliner.    Ambulation/Gait                   Stairs             Wheelchair Mobility     Tilt Bed    Modified Rankin (Stroke Patients Only)       Balance Overall balance assessment: Needs assistance Sitting-balance support: Feet supported Sitting balance-Leahy Scale: Fair       Standing balance-Leahy Scale: Poor Standing balance comment: reliant on UE support.                            Cognition Arousal/Alertness: Awake/alert Behavior During Therapy: WFL for tasks assessed/performed Overall Cognitive Status: Within Functional Limits for tasks assessed                                          Exercises      General Comments        Pertinent Vitals/Pain Pain Assessment Faces Pain Scale: Hurts whole lot Pain Location: legs when standing Pain Descriptors / Indicators: Aching, Discomfort, Moaning, Guarding Pain Intervention(s): Monitored during session, Limited activity within patient's tolerance    Home Living                          Prior Function  PT Goals (current goals can now be found in the care plan section) Progress towards PT goals: Progressing toward goals    Frequency    Min 1X/week      PT Plan Current plan remains appropriate    Co-evaluation              AM-PAC PT "6 Clicks" Mobility   Outcome Measure  Help needed turning from your back to your side while in a flat bed without using bedrails?: A Little Help needed moving from lying on your back to sitting on the side of a flat bed without using bedrails?: A Little Help needed moving to and from a bed to a chair (including a wheelchair)?: A Lot Help needed standing up from a chair using your arms (e.g., wheelchair or bedside chair)?: A Lot Help needed to walk in hospital room?: Total Help needed climbing 3-5 steps with a  railing? : Total 6 Click Score: 12    End of Session Equipment Utilized During Treatment: Gait belt Activity Tolerance: Patient tolerated treatment well Patient left: in chair;with call bell/phone within reach;with chair alarm set Nurse Communication: Mobility status;Need for lift equipment PT Visit Diagnosis: Unsteadiness on feet (R26.81)     Time: 1610-9604 PT Time Calculation (min) (ACUTE ONLY): 25 min  Charges:    $Therapeutic Activity: 23-37 mins PT General Charges $$ ACUTE PT VISIT: 1 Visit                     Blanchard Kelch PT Acute Rehabilitation Services Office [phone] Weekend pager-[phone]    Rada Hay 11/13/2022, 12:53 PM

## 2022-11-13 NOTE — Progress Notes (Signed)
9 Days Post-Op   Subjective/Chief Complaint: Eating about half as much as needed Having bowel function and urinating without issue Up with therapy to chair today Wound vac change today  Objective: Vital signs in last 24 hours: Temp:  [98.1 F (36.7 C)-98.9 F (37.2 C)] 98.9 F (37.2 C) (07/01 1200) Pulse Rate:  [78-112] 112 (07/01 1200) Resp:  [20-28] 26 (07/01 1200) BP: (110-146)/(49-80) 140/79 (07/01 1200) SpO2:  [88 %-99 %] 97 % (07/01 1200) Weight:  [107.2 kg] 107.2 kg (07/01 0500) Last BM Date : 11/12/22  Intake/Output from previous day: 06/30 0701 - 07/01 0700 In: 1469.3 [I.V.:1199.7; IV Piggyback:269.6] Out: 2400 [Urine:2400] Intake/Output this shift: Total I/O In: 10 [I.V.:10] Out: -   Exam: Intubated and sedated Abdomen soft, Wound vac holding suction with serosanguinous drainage  Lab Results:  Recent Labs    11/12/22 0500  WBC 14.2*  HGB 8.2*  HCT 27.7*  PLT 479*    BMET Recent Labs    11/12/22 0500 11/13/22 0500  NA 138 137  K 3.8 3.9  CL 106 103  CO2 26 24  GLUCOSE 146* 149*  BUN 21* 21*  CREATININE 0.57 0.52  CALCIUM 8.1* 8.4*    PT/INR No results for input(s): "LABPROT", "INR" in the last 72 hours. ABG No results for input(s): "PHART", "HCO3" in the last 72 hours.  Invalid input(s): "PCO2", "PO2"  Studies/Results: No results found.  Anti-infectives: Anti-infectives (From admission, onward)    Start     Dose/Rate Route Frequency Ordered Stop   11/05/22 1215  vancomycin (VANCOREADY) IVPB 1750 mg/350 mL  Status:  Discontinued        1,750 mg 175 mL/hr over 120 Minutes Intravenous Every 24 hours 11/04/22 1125 11/08/22 1023   11/04/22 1215  vancomycin (VANCOREADY) IVPB 2000 mg/400 mL        2,000 mg 200 mL/hr over 120 Minutes Intravenous STAT 11/04/22 1125 11/04/22 1402   11/04/22 1000  fluconazole (DIFLUCAN) IVPB 400 mg        400 mg 100 mL/hr over 120 Minutes Intravenous Every 24 hours 11/03/22 0801     11/03/22 0845   fluconazole (DIFLUCAN) IVPB 800 mg        800 mg 100 mL/hr over 240 Minutes Intravenous  Once 11/03/22 0759 11/03/22 1327   10/28/22 1000  azithromycin (ZITHROMAX) tablet 250 mg  Status:  Discontinued       See Hyperspace for full Linked Orders Report.   250 mg Oral Daily 10/27/22 0738 10/27/22 1653   10/27/22 1700  piperacillin-tazobactam (ZOSYN) IVPB 3.375 g        3.375 g 12.5 mL/hr over 240 Minutes Intravenous Every 8 hours 10/27/22 1653     10/27/22 1000  azithromycin (ZITHROMAX) tablet 500 mg       See Hyperspace for full Linked Orders Report.   500 mg Oral Daily 10/27/22 0738 10/27/22 0946   10/23/22 1054  ceFAZolin (ANCEF) 2-4 GM/100ML-% IVPB       Note to Pharmacy: Augustine Radar R: cabinet override      10/23/22 1054 10/23/22 2259   10/23/22 0600  ceFAZolin (ANCEF) IVPB 3g/100 mL premix        3 g 200 mL/hr over 30 Minutes Intravenous On call to O.R. 10/23/22 0540 10/23/22 1213       Assessment/Plan: Ms. Berthelot underwent difficult robotic recurrent incisional hernia repair with mesh, bilateral posterior rectus myofascial release, bilateral transversus abdominis release and removal of previous polypropylene mesh with prolonged lyses of adhesions  on 10/23/22. This was complicated by bowel perforation, possible missed enterotomy during dissection of small intestine off mesh, requiring return to the operating room with washout, small bowel resection and ABTHERA placement on 10/29/22.  Returned to the OR for wash out 6/18 and 6/20.  Underwent bridging retromuscular Phasix mesh closure of the abdomen 11/04/22.  ID consult for polymicrobial infection - continue antibiotics - recheck CT abd/pel today to evaluate for undrained fluid collections, also may be able to remove JP drains if CT looks good as JP drains just serous at this point. Fevers may be related to phasix and vicryl mesh placed in a contaminated field.  Will need long term antibiotics.  Vac changed today 7/1 by WOC team -  appreciate assistance and care PT/OT Lower to med-surg level of care Half TPN today, possibly off tomorrow  Critical care signed off, appreciate assistance ID following  Dispo - possible inpatient rehab discharge this week?  Would like TPN off and CT complete prior to discharge.  Will need wound vac care, will need plan in place for antibiotics, will need significant therapy.    [name], MD  11/13/2022

## 2022-11-13 NOTE — Progress Notes (Signed)
Subjective: No new complaints   Antibiotics:  Anti-infectives (From admission, onward)    Start     Dose/Rate Route Frequency Ordered Stop   11/05/22 1215  vancomycin (VANCOREADY) IVPB 1750 mg/350 mL  Status:  Discontinued        1,750 mg 175 mL/hr over 120 Minutes Intravenous Every 24 hours 11/04/22 1125 11/08/22 1023   11/04/22 1215  vancomycin (VANCOREADY) IVPB 2000 mg/400 mL        2,000 mg 200 mL/hr over 120 Minutes Intravenous STAT 11/04/22 1125 11/04/22 1402   11/04/22 1000  fluconazole (DIFLUCAN) IVPB 400 mg        400 mg 100 mL/hr over 120 Minutes Intravenous Every 24 hours 11/03/22 0801     11/03/22 0845  fluconazole (DIFLUCAN) IVPB 800 mg        800 mg 100 mL/hr over 240 Minutes Intravenous  Once 11/03/22 0759 11/03/22 1327   10/28/22 1000  azithromycin (ZITHROMAX) tablet 250 mg  Status:  Discontinued       See Hyperspace for full Linked Orders Report.   250 mg Oral Daily 10/27/22 0738 10/27/22 1653   10/27/22 1700  piperacillin-tazobactam (ZOSYN) IVPB 3.375 g        3.375 g 12.5 mL/hr over 240 Minutes Intravenous Every 8 hours 10/27/22 1653     10/27/22 1000  azithromycin (ZITHROMAX) tablet 500 mg       See Hyperspace for full Linked Orders Report.   500 mg Oral Daily 10/27/22 0738 10/27/22 0946   10/23/22 1054  ceFAZolin (ANCEF) 2-4 GM/100ML-% IVPB       Note to Pharmacy: Augustine Radar R: cabinet override      10/23/22 1054 10/23/22 2259   10/23/22 0600  ceFAZolin (ANCEF) IVPB 3g/100 mL premix        3 g 200 mL/hr over 30 Minutes Intravenous On call to O.R. 10/23/22 0540 10/23/22 1213       Medications: Scheduled Meds:  acetaminophen  1,000 mg Oral Q6H   ascorbic acid  500 mg Oral Daily   Chlorhexidine Gluconate Cloth  6 each Topical Daily   vitamin B-12  500 mcg Oral Daily   enoxaparin (LOVENOX) injection  40 mg Subcutaneous Q24H   famotidine  20 mg Oral BID   feeding supplement  237 mL Oral BID BM   gabapentin  400 mg Oral TID   losartan   25-50 mg Oral Daily   And   hydrochlorothiazide  6.25-12.5 mg Oral Daily   insulin aspart  0-15 Units Subcutaneous Q4H   liver oil-zinc oxide   Topical BID   loperamide  2 mg Oral Once   nystatin   Topical BID   polycarbophil  625 mg Oral BID   sodium chloride flush  10-40 mL Intracatheter Q12H   Continuous Infusions:  sodium chloride 10 mL/hr at 11/12/22 1800   fluconazole (DIFLUCAN) IV 400 mg (11/13/22 1119)   methocarbamol (ROBAXIN) IV     piperacillin-tazobactam (ZOSYN)  IV 3.375 g (11/13/22 0943)   TPN ADULT (ION) 100 mL/hr at 11/12/22 1800   TPN ADULT (ION)     PRN Meds:.albuterol, bismuth subsalicylate, HYDROmorphone (DILAUDID) injection, lidocaine, loperamide, meclizine, methocarbamol (ROBAXIN) IV, ondansetron (ZOFRAN) IV, mouth rinse, oxyCODONE, oxyCODONE, prochlorperazine, simethicone, sodium chloride, sodium chloride flush    Objective: Weight change: 0 kg  Intake/Output Summary (Last 24 hours) at 11/13/2022 1400 Last data filed at 11/13/2022 0945 Gross per 24 hour  Intake 703.56 ml  Output 2400  ml  Net -1696.44 ml   Blood pressure (!) 140/79, pulse (!) 112, temperature 98.9 F (37.2 C), temperature source Oral, resp. rate (!) 26, height 5\' 5"  (1.651 m), weight 107.2 kg, SpO2 97 %. Temp:  [98.1 F (36.7 C)-98.9 F (37.2 C)] 98.9 F (37.2 C) (07/01 1200) Pulse Rate:  [78-112] 112 (07/01 1200) Resp:  [20-28] 26 (07/01 1200) BP: (110-146)/(49-80) 140/79 (07/01 1200) SpO2:  [88 %-99 %] 97 % (07/01 1200) Weight:  [107.2 kg] 107.2 kg (07/01 0500)  Physical Exam: Physical Exam Constitutional:      General: She is not in acute distress.    Appearance: She is well-developed. She is not diaphoretic.  HENT:     Head: Normocephalic and atraumatic.     Right Ear: External ear normal.     Left Ear: External ear normal.     Mouth/Throat:     Pharynx: No oropharyngeal exudate.  Eyes:     General: No scleral icterus.    Conjunctiva/sclera: Conjunctivae normal.      Pupils: Pupils are equal, round, and reactive to light.  Cardiovascular:     Rate and Rhythm: Normal rate and regular rhythm.  Pulmonary:     Effort: Pulmonary effort is normal. No respiratory distress.     Breath sounds: No wheezing.  Musculoskeletal:        General: No tenderness. Normal range of motion.  Lymphadenopathy:     Cervical: No cervical adenopathy.  Skin:    General: Skin is warm and dry.     Coloration: Skin is pale.     Findings: No erythema or rash.  Neurological:     General: No focal deficit present.     Mental Status: She is alert and oriented to person, place, and time.     Motor: No abnormal muscle tone.  Psychiatric:        Mood and Affect: Mood normal.        Behavior: Behavior normal.        Thought Content: Thought content normal.        Judgment: Judgment normal.     Vacuum dressing in place  2 JP drains with purulent material present  CBC:    BMET Recent Labs    11/12/22 0500 11/13/22 0500  NA 138 137  K 3.8 3.9  CL 106 103  CO2 26 24  GLUCOSE 146* 149*  BUN 21* 21*  CREATININE 0.57 0.52  CALCIUM 8.1* 8.4*     Liver Panel  Recent Labs    11/12/22 0500 11/13/22 0500  PROT 6.5 6.5  ALBUMIN 2.0* 1.9*  AST 30 24  ALT 33 28  ALKPHOS 96 101  BILITOT 0.3 0.2*       Sedimentation Rate No results for input(s): "ESRSEDRATE" in the last 72 hours. C-Reactive Protein No results for input(s): "CRP" in the last 72 hours.  Micro Results: Recent Results (from the past 720 hour(s))  MRSA Next Gen by PCR, Nasal     Status: None   Collection Time: 10/29/22  4:49 PM   Specimen: Nasal Mucosa; Nasal Swab  Result Value Ref Range Status   MRSA by PCR Next Gen NOT DETECTED NOT DETECTED Final    Comment: (NOTE) The GeneXpert MRSA Assay (FDA approved for NASAL specimens only), is one component of a comprehensive MRSA colonization surveillance program. It is not intended to diagnose MRSA infection nor to guide or monitor treatment for  MRSA infections. Test performance is not FDA approved in patients less  than 32 years old. Performed at [facility], [address], [place], [place] [address]   Aerobic/Anaerobic Culture w Gram Stain (surgical/deep wound)     Status: None   Collection Time: 10/29/22  6:33 PM   Specimen: Path fluid; Body Fluid  Result Value Ref Range Status   Specimen Description FLUID ABDOMEN  Final   Special Requests SWAB ID A  Final   Gram Stain   Final    NO WBC SEEN FEW GRAM NEGATIVE RODS RARE GRAM POSITIVE RODS RARE GRAM POSITIVE COCCI Performed at [facility], [address] [place], [place] [address]    Culture   Final    MODERATE STREPTOCOCCUS GROUP G Beta hemolytic streptococci are predictably susceptible to penicillin and other beta lactams. Susceptibility testing not routinely performed. MODERATE ACTINOMYCES SPECIES Standardized susceptibility testing for this organism is not available. ABUNDANT BACTEROIDES OVATUS BETA LACTAMASE POSITIVE MODERATE CANDIDA ALBICANS    Report Status 11/02/2022 FINAL  Final  Culture, blood (Routine X 2) w Reflex to ID Panel     Status: None   Collection Time: 11/03/22 10:18 AM   Specimen: BLOOD LEFT ARM  Result Value Ref Range Status   Specimen Description   Final    BLOOD LEFT ARM Performed at [facility], [address] [place], [place] [address]    Special Requests   Final    BOTTLES DRAWN AEROBIC ONLY Blood Culture adequate volume Performed at [facility], [address], [place], [place] [address]    Culture   Final    NO GROWTH 5 DAYS Performed at [facility], [address], [place], [place] [address]    Report Status 11/08/2022 FINAL  Final  Culture, blood (Routine X 2) w Reflex to ID Panel     Status: None   Collection Time: 11/03/22 10:20 AM   Specimen: BLOOD LEFT HAND  Result Value Ref Range Status   Specimen Description   Final    BLOOD LEFT HAND Performed  at [facility], [address] [place], [place] [address]    Special Requests   Final    BOTTLES DRAWN AEROBIC ONLY Blood Culture results may not be optimal due to an inadequate volume of blood received in culture bottles Performed at [facility], [address] [place], [place] [address]    Culture   Final    NO GROWTH 5 DAYS Performed at [facility], [address], [place], [place] [address]    Report Status 11/08/2022 FINAL  Final    Studies/Results: No results found.    Assessment/Plan:  INTERVAL HISTORY: patient is changed to stepdown status   Principal Problem:   Small bowel perforation (HCC) Active Problems:   HTN (hypertension)   Asthma   BPPV (benign paroxysmal positional vertigo)   Recurrent ventral hernia   Morbid obesity (HCC)   Polyarthralgia   New onset type 2 diabetes mellitus (HCC)   Peritonitis (HCC)   Sepsis due to Candida species (HCC)   Physical deconditioning   Protein-calorie malnutrition, moderate (HCC)    Kaitlyn Bray is a 57 y.o. female who developed post robotic incisional hernia repair, feculent peritonitis obotic incisional hernia repair with mesh ( vicryl mesh patch of posterior layer of repair) , removal of previous intraperitoneal mesh and lysis of adhesions, bilateral transversus abdominis and bilateral posterior rectus myofascial release on 6/10 who had been progressing well until started developing fever and  drainage of foul-smelling fluid from her lower abdominal incision site She underwent OR for exploratory ex lap with resection of small bowel perforation and primary anastomosis and excision of mesh with placement of ABThera wound VAC on 6/18  She went back on 11/04/2022 and all prior mesh removed and absorbable mesh placed  Cultures with group G streptococci, actinomyecs, bacteroides ovatus beta lactamase positive and candida albicans   I think it would be prudent to assess  currrent drains and to look for any additional intra-abdominal abscess that may have formed by obtaining CT abdomen and pelvis  We will switch from zosyn to unasyn given no organisms recovered that unasyn would nto cover, while continuing fluconazole  I have personally spent 54 minutes involved in face-to-face and non-face-to-face activities for this patient on the day of the visit. Professional time spent includes the following activities: Preparing to see the patient (review of tests), Obtaining and/or reviewing separately obtained history (admission/discharge record), Performing a medically appropriate examination and/or evaluation , Ordering medications/tests/procedures, referring and communicating with other health care professionals, Documenting clinical information in the EMR, Independently interpreting results (not separately reported), Communicating results to the patient/family/caregiver, Counseling and educating the patient/family/caregiver and Care coordination (not separately reported).       LOS: 21 days   Acey Lav 11/13/2022, 2:00 PM

## 2022-11-13 NOTE — Progress Notes (Signed)
Inpatient Rehab Admissions Coordinator:  Attempted to contact husband again. No one answered. Unable to leave a message. Will continue to follow.  Wolfgang Phoenix, MS, CCC-SLP Admissions Coordinator [phone]

## 2022-11-13 NOTE — Progress Notes (Signed)
Nutrition Follow-up  DOCUMENTATION CODES:   Obesity unspecified  INTERVENTION:  - 48 hour calorie count ordered, started yesterday to run through today (7/1).  - Please document % intake of all foods, drinks, and nutrition supplements patient consumes on meal tickets and place in envelope on patient's door.  - If patient skips/refuses meals please document 0% for that meal.  - Discussed with RN.  - Soft diet.  - Ensure Plus High Protein po BID, each supplement provides 350 kcal and 20 grams of protein. - Encourage intake at all meals and of supplements.   - TPN at 153mL/hr tonight, adding back in lipids per pharmacy. Provides 144g protein, 360g dextrose, and 480 IL kcals = 2280 kcal  - TPN management per pharmacy.   - Plan to monitor results of calorie count for ability to decrease/wean off TPN.   NUTRITION DIAGNOSIS:   Increased nutrient needs related to acute illness as evidenced by estimated needs.   GOAL:   Patient will meet greater than or equal to 90% of their needs *progressing, on TPN  MONITOR:   PO intake, Supplement acceptance, Diet advancement, Labs, Weight trends  REASON FOR ASSESSMENT:   Consult New TPN/TNA  ASSESSMENT:   57 y.o. female with PMH HTN and Diabetes who presented for robotic hernia repair now complicated by bowel perforation.  6/10 Admit, s/p hernia repair with lysis of adhesions 6/16 patient went to bathroom and had large amount of foul smelling fluid gush from abdominal wall; taken to surgery emergently -> found to have small bowel injury with perforation and contamination of abdominal cavity s/p ex-lap, resection of small bowel perf, ABThera wound VAC placed with open abdomen 6/18 ex-lap, washout of abdomen, ABThera wound VAC placement 6/19 TPN started 6/20 repeat ex-lap, washout of abdomen, ABThera wound VAC placement 6/22 closure of abdomen, wound vac to incision 6/24 lipids removed from TPN due to TG 406 6/26 Extubated 6/27 Clear  Liquids 6/29 DYS 1 diet 6/30 Soft diet, CC started  Patient sitting in bedside chair at time of visit, alert and oriented.  She reports her appetite is fair. Per RN, patient had 50% of all meals yesterday, calorie count results from yesterday as below: Day 1 6/30: Breakfast: 50% Lunch: 50% Dinner: 50% Supplements: - Total intake: 828 kcal (44% of minimum estimated needs)  24 protein (21% of minimum estimated needs)  RN reports patient did not want a breakfast this morning but lunch already ordered.  Stressed importance of ordering and having something at each meal as tolerated. Patient agreeable to try Ensure to support intake.   TPN continues at this time. Lipids added back in today as triglycerides now <400. TPN to start this evening will meet >100% of needs. Plan to monitor intake via calorie count for ability to wean TPN.    Admit weight: 252# Current weight: 236# Possible weight loss since admit. However, patient was NPO for several days after intubation and has not been able to reach goal TPN due to elevated triglycerides  Medications reviewed and include: 500mg  vitamin C, vitamin B12, Fibercon, Insulin  Labs reviewed:  HA1C 8.5 Blood Glucose 126-166 x24 hours  Latest Reference Range & Units 10/30/22 08:13 11/06/22 06:30 11/09/22 06:13 11/13/22 05:00  Triglycerides <150 mg/dL 161 (H) 096 (H) 045 (H) 302 (H)  (H): Data is abnormally high   Diet Order:   Diet Order             DIET SOFT Room service appropriate? Yes; Fluid consistency: Thin  Diet effective now                   EDUCATION NEEDS:  Education needs have been addressed  Skin:  Skin Assessment: Reviewed RN Assessment Skin Integrity Issues:: Incisions Incisions: Abdomen  Last BM:  6/30  Height:  Ht Readings from Last 1 Encounters:  11/07/22 5\' 5"  (1.651 m)   Weight:  Wt Readings from Last 1 Encounters:  11/13/22 107.2 kg   Ideal Body Weight:  56.82 kg  BMI:  Body mass index is  39.33 kg/m.  Estimated Nutritional Needs:  Kcal:  1900-2200 kcals Protein:  115-145 grams Fluid:  >/= 1.9L    Shelle Iron RD, LDN For contact information, refer to [facility].

## 2022-11-14 ENCOUNTER — Encounter (HOSPITAL_COMMUNITY): Payer: Self-pay | Admitting: Critical Care Medicine

## 2022-11-14 ENCOUNTER — Inpatient Hospital Stay (HOSPITAL_COMMUNITY): Payer: 59

## 2022-11-14 LAB — CBC
HCT: 27.7 % — ABNORMAL LOW (ref 36.0–46.0)
Hemoglobin: 8.1 g/dL — ABNORMAL LOW (ref 12.0–15.0)
MCH: 26.8 pg (ref 26.0–34.0)
MCHC: 29.2 g/dL — ABNORMAL LOW (ref 30.0–36.0)
MCV: 91.7 fL (ref 80.0–100.0)
Platelets: 434 10*3/uL — ABNORMAL HIGH (ref 150–400)
RBC: 3.02 MIL/uL — ABNORMAL LOW (ref 3.87–5.11)
RDW: 15.9 % — ABNORMAL HIGH (ref 11.5–15.5)
WBC: 13.1 10*3/uL — ABNORMAL HIGH (ref 4.0–10.5)
nRBC: 0.2 % (ref 0.0–0.2)

## 2022-11-14 LAB — BASIC METABOLIC PANEL
Anion gap: 9 (ref 5–15)
BUN: 24 mg/dL — ABNORMAL HIGH (ref 6–20)
CO2: 24 mmol/L (ref 22–32)
Calcium: 8.3 mg/dL — ABNORMAL LOW (ref 8.9–10.3)
Chloride: 102 mmol/L (ref 98–111)
Creatinine, Ser: 0.57 mg/dL (ref 0.44–1.00)
GFR, Estimated: >60 mL/min (ref >60)
Glucose, Bld: 132 mg/dL — ABNORMAL HIGH (ref 70–99)
Potassium: 3.8 mmol/L (ref 3.5–5.1)
Sodium: 135 mmol/L (ref 135–145)

## 2022-11-14 LAB — GLUCOSE, CAPILLARY
Glucose-Capillary: 101 mg/dL — ABNORMAL HIGH (ref 70–99)
Glucose-Capillary: 103 mg/dL — ABNORMAL HIGH (ref 70–99)
Glucose-Capillary: 112 mg/dL — ABNORMAL HIGH (ref 70–99)
Glucose-Capillary: 118 mg/dL — ABNORMAL HIGH (ref 70–99)
Glucose-Capillary: 127 mg/dL — ABNORMAL HIGH (ref 70–99)
Glucose-Capillary: 131 mg/dL — ABNORMAL HIGH (ref 70–99)

## 2022-11-14 MED ORDER — IOHEXOL 300 MG/ML  SOLN
100.0000 mL | Freq: Once | INTRAMUSCULAR | Status: AC | PRN
  Administered 2022-11-14: 100 mL via INTRAVENOUS

## 2022-11-14 MED ORDER — IOHEXOL 9 MG/ML PO SOLN
500.0000 mL | ORAL | Status: AC
  Administered 2022-11-14 (×2): 500 mL via ORAL

## 2022-11-14 MED ORDER — SODIUM CHLORIDE (PF) 0.9 % IJ SOLN
INTRAMUSCULAR | Status: AC
  Filled 2022-11-14: qty 50

## 2022-11-14 MED ORDER — IOHEXOL 9 MG/ML PO SOLN
ORAL | Status: AC
  Filled 2022-11-14: qty 1000

## 2022-11-14 MED ORDER — SODIUM CHLORIDE 0.9 % IV SOLN
3.0000 g | Freq: Four times a day (QID) | INTRAVENOUS | Status: DC
  Administered 2022-11-14 – 2022-11-15 (×5): 3 g via INTRAVENOUS
  Filled 2022-11-14 (×6): qty 8

## 2022-11-14 MED ORDER — TRAVASOL 10 % IV SOLN
INTRAVENOUS | Status: AC
  Filled 2022-11-14: qty 720

## 2022-11-14 NOTE — Progress Notes (Signed)
Physical Therapy Treatment Patient Details Name: Kaitlyn Bray MRN: [id number] DOB: Jun 23, 1965 Today's Date: 11/14/2022   History of Present Illness Patient is a 57 year old female who underwent ventral hernia s/p robotic incisional repair on 6/10 complicated by bowel perforation, sepsis, and peritonitis s/p ex lap on 6/16 and wash out on 6/18, 6/20 with closing of the abdomen on 6/22. Patient was extubated on 6/26. PMH: obesity, diabetes, HTN    PT Comments  The patient  able to stand and take steps to recliner using  RW with 2 persons. Patient  will benefit from post acute PT  at DC. Patient motivated, asking for  therabands and squeeze balls.     Assistance Recommended at Discharge Frequent or constant Supervision/Assistance  If plan is discharge home, recommend the following:  Can travel by private vehicle    Two people to help with walking and/or transfers;A lot of help with bathing/dressing/bathroom;Assistance with cooking/housework;Assist for transportation;Help with stairs or ramp for entrance      Equipment Recommendations  None recommended by PT    Recommendations for Other Services Rehab consult     Precautions / Restrictions Precautions Precautions: Fall;Other (comment) Precaution Comments: 2 jp drains; abdominal wound vac; abdominal precautions Restrictions Weight Bearing Restrictions: No     Mobility  Bed Mobility   Bed Mobility: Supine to Sit, Rolling Rolling: Min assist         General bed mobility comments: assist trunk to rise, cues for abdominal precautions, use of bed rails to assist    Transfers   Equipment used: Rolling walker (2 wheels) Transfers: Sit to/from Stand, Bed to chair/wheelchair/BSC Sit to Stand: Mod assist, +2 physical assistance, +2 safety/equipment, From elevated surface           General transfer comment: patient able to rise up from bed with mod assist at [facility], able to step  to recliner today using RW.     Ambulation/Gait                   Stairs             Wheelchair Mobility     Tilt Bed    Modified Rankin (Stroke Patients Only)       Balance Overall balance assessment: Needs assistance Sitting-balance support: Feet supported Sitting balance-Leahy Scale: Fair     Standing balance support: Bilateral upper extremity supported, Reliant on assistive device for balance, During functional activity Standing balance-Leahy Scale: Poor Standing balance comment: reliant on UE support.                            Cognition Arousal/Alertness: Awake/alert Behavior During Therapy: WFL for tasks assessed/performed, Anxious Overall Cognitive Status: Within Functional Limits for tasks assessed                                          Exercises      General Comments        Pertinent Vitals/Pain Pain Assessment Faces Pain Scale: Hurts even more Pain Location: legs when standing Pain Descriptors / Indicators: Aching, Discomfort, Moaning, Guarding Pain Intervention(s): Monitored during session, Limited activity within patient's tolerance    Home Living                          Prior Function  PT Goals (current goals can now be found in the care plan section) Progress towards PT goals: Progressing toward goals    Frequency    Min 1X/week      PT Plan Current plan remains appropriate    Co-evaluation PT/OT/SLP Co-Evaluation/Treatment: Yes Reason for Co-Treatment: Complexity of the patient's impairments (multi-system involvement);For patient/therapist safety;To address functional/ADL transfers   OT goals addressed during session: ADL's and self-care      AM-PAC PT "6 Clicks" Mobility   Outcome Measure  Help needed turning from your back to your side while in a flat bed without using bedrails?: A Little Help needed moving from lying on your back to sitting on the side of a flat bed without using  bedrails?: A Little Help needed moving to and from a bed to a chair (including a wheelchair)?: A Lot Help needed standing up from a chair using your arms (e.g., wheelchair or bedside chair)?: A Lot Help needed to walk in hospital room?: Total Help needed climbing 3-5 steps with a railing? : Total 6 Click Score: 12    End of Session Equipment Utilized During Treatment: Gait belt Activity Tolerance: Patient tolerated treatment well Patient left: in chair;with call bell/phone within reach Nurse Communication: Mobility status PT Visit Diagnosis: Unsteadiness on feet (R26.81)     Time: 1035-1050 PT Time Calculation (min) (ACUTE ONLY): 15 min  Charges:    $Therapeutic Activity: 8-22 mins PT General Charges $$ ACUTE PT VISIT: 1 Visit                     Blanchard Kelch PT Acute Rehabilitation Services Office [phone] Weekend [phone]    Rada Hay 11/14/2022, 2:07 PM

## 2022-11-14 NOTE — Progress Notes (Signed)
10 Days Post-Op   Subjective/Chief Complaint: Tolerating food.  No major changes.  Objective: Vital signs in last 24 hours: Temp:  [98.1 F (36.7 C)-98.9 F (37.2 C)] 98.1 F (36.7 C) (07/02 0441) Pulse Rate:  [73-112] 73 (07/02 0441) Resp:  [14-26] 18 (07/02 0441) BP: (107-140)/(67-80) 125/72 (07/02 0441) SpO2:  [92 %-98 %] 98 % (07/02 0441) Weight:  [106 kg] 106 kg (07/02 0621) Last BM Date : 11/14/22  Intake/Output from previous day: 07/01 0701 - 07/02 0700 In: 1816.7 [I.V.:1466.8; IV Piggyback:350] Out: 1056 [Urine:950; Drains:106] Intake/Output this shift: Total I/O In: 240 [P.O.:240] Out: 200 [Urine:200]  Exam: Abdomen soft, Wound vac holding suction with serosanguinous drainage  Lab Results:  Recent Labs    11/12/22 0500 11/14/22 0434  WBC 14.2* 13.1*  HGB 8.2* 8.1*  HCT 27.7* 27.7*  PLT 479* 434*    BMET Recent Labs    11/13/22 0500 11/14/22 0434  NA 137 135  K 3.9 3.8  CL 103 102  CO2 24 24  GLUCOSE 149* 132*  BUN 21* 24*  CREATININE 0.52 0.57  CALCIUM 8.4* 8.3*    PT/INR No results for input(s): "LABPROT", "INR" in the last 72 hours. ABG No results for input(s): "PHART", "HCO3" in the last 72 hours.  Invalid input(s): "PCO2", "PO2"  Studies/Results: No results found.  Anti-infectives: Anti-infectives (From admission, onward)    Start     Dose/Rate Route Frequency Ordered Stop   11/14/22 0830  Ampicillin-Sulbactam (UNASYN) 3 g in sodium chloride 0.9 % 100 mL IVPB        3 g 200 mL/hr over 30 Minutes Intravenous Every 6 hours 11/14/22 0740     11/05/22 1215  vancomycin (VANCOREADY) IVPB 1750 mg/350 mL  Status:  Discontinued        1,750 mg 175 mL/hr over 120 Minutes Intravenous Every 24 hours 11/04/22 1125 11/08/22 1023   11/04/22 1215  vancomycin (VANCOREADY) IVPB 2000 mg/400 mL        2,000 mg 200 mL/hr over 120 Minutes Intravenous STAT 11/04/22 1125 11/04/22 1402   11/04/22 1000  fluconazole (DIFLUCAN) IVPB 400 mg        400  mg 100 mL/hr over 120 Minutes Intravenous Every 24 hours 11/03/22 0801     11/03/22 0845  fluconazole (DIFLUCAN) IVPB 800 mg        800 mg 100 mL/hr over 240 Minutes Intravenous  Once 11/03/22 0759 11/03/22 1327   10/28/22 1000  azithromycin (ZITHROMAX) tablet 250 mg  Status:  Discontinued       See Hyperspace for full Linked Orders Report.   250 mg Oral Daily 10/27/22 0738 10/27/22 1653   10/27/22 1700  piperacillin-tazobactam (ZOSYN) IVPB 3.375 g  Status:  Discontinued        3.375 g 12.5 mL/hr over 240 Minutes Intravenous Every 8 hours 10/27/22 1653 11/14/22 0740   10/27/22 1000  azithromycin (ZITHROMAX) tablet 500 mg       See Hyperspace for full Linked Orders Report.   500 mg Oral Daily 10/27/22 0738 10/27/22 0946   10/23/22 1054  ceFAZolin (ANCEF) 2-4 GM/100ML-% IVPB       Note to Pharmacy: Augustine Radar R: cabinet override      10/23/22 1054 10/23/22 2259   10/23/22 0600  ceFAZolin (ANCEF) IVPB 3g/100 mL premix        3 g 200 mL/hr over 30 Minutes Intravenous On call to O.R. 10/23/22 0540 10/23/22 1213       Assessment/Plan: Kaitlyn Bray  underwent difficult robotic recurrent incisional hernia repair with mesh, bilateral posterior rectus myofascial release, bilateral transversus abdominis release and removal of previous polypropylene mesh with prolonged lyses of adhesions on 10/23/22. This was complicated by bowel perforation, possible missed enterotomy during dissection of small intestine off mesh, requiring return to the operating room with washout, small bowel resection and ABTHERA placement on 10/29/22.  Returned to the OR for wash out 6/18 and 6/20.  Underwent bridging retromuscular Phasix mesh closure of the abdomen 11/04/22.  ID consult for polymicrobial infection - continue antibiotics - recheck CT abd/pel today to evaluate for undrained fluid collections, also may be able to remove JP drains if CT looks good as JP drains just serous at this point. Antibiotic plan per ID Vac  changed today 7/1 by [facility] team - appreciate assistance and care PT/OT Stop TPN  Critical care signed off, appreciate assistance ID following  Dispo - possible inpatient rehab discharge after CT scan completed?  Will need wound vac care, will need plan in place for antibiotics, will need significant therapy.    [name], MD  11/14/2022

## 2022-11-14 NOTE — Progress Notes (Addendum)
PHARMACY - TOTAL PARENTERAL NUTRITION CONSULT NOTE   Indication: Prolonged ileus  Patient Measurements: Height: 5\' 5"  (165.1 cm) Weight: 106 kg (233 lb 11 oz) IBW/kg (Calculated) : 57 TPN AdjBW (KG): 71.4 Body mass index is 38.89 kg/m.  Assessment:  Pharmacy consulted to start TPN on a 57 yo pt with complicated robotic recurrent incisional hernia repair with mesh, bilateral posterior rectus myofascial release, bilateral transversus abdominis release and removal of previous polypropylene mesh with prolonged lyses of adhesions on 10/23/22. This was complicated by a bowel perforation requiring return to OR on 6/16, 6/18, 6/20, and 6/22.   Glucose / Insulin:  - A1c 8.5 (6/3) and takes PTA Metformin - Pt on mSSI q4h; Previous 24 hour insulin use: 15 units  - CBGs have ranged 126-166 in past 24 hours (target <150) Electrolytes: WNL, CorrCa 10.08 Renal: SCr stable, BUN slightly elevated Hepatic: WNL, Tbili low 0.2, albumin remains low - TG 406 (6/24) - lipids out of TPN on 6/24; TG 388 (6/27), TG decreased to 302 (7/1).  Resume lipids in TPN.  Intake / Output; MIVF: no mIVF - UOP: unmeasured.  Stool occurrences not documented but RN reports diarrhea is improving.  - Drain output not documented but MD notes output slowing.   GI Imaging: NA GI Surgeries / Procedures:  - 6/10 Robotic incisional hernia repair - 6/16 exploratory laparotomy, repair of small bowel perforation  - 6/18 exploratory laparotomy, wash out of abdomen and wound vac placement  - 6/20 exploratory laparotomy, wound wash out of abdomen and wound vac change  - 6/22 absorbable mesh closure of abdomen, application of wound vac   Central access: PICC  TPN start date: 11/01/22  Nutritional Goals: TPN rate is 50 mL/hr  Energy Contribution   kcal %  Protein 144g 576 25.26  Dextrose 15% 1,224 53.69  Lipids 20 g/L  480 21.05  Total 21.27 2,280      RD Assessment: Estimated Needs Total Energy Estimated Needs: 1900-2200  kcals Total Protein Estimated Needs: 115-145 grams Total Fluid Estimated Needs: >/= 1.9L  Current Nutrition:  - TPN - 6/30 advanced to soft diet (meal intake not recorded, but RN reports ~50% of 3 meals yesterday), TPN rate reduced to 50%  Today, 11/14/22 Currently, no percentage of meals documented, no new dietician notes, no new surgery notes or communication since yesterday  Plan:  - Continue TPN at 50 mL/hr with no changes in formulation - Electrolytes in TPN:  Na 20 mEq/L  K 45 mEq/L  Ca 4 mEq/L  Mg 2 mEq/L Phos 15 mmol/L  Cl:Ac 1:1 - Add standard MVI and trace elements to TPN - Continue Moderate q4h SSI and adjust as needed  - Monitor TPN labs on Mon/Thurs at minimum, BMP tomorrow   - Follow up further diet progression and plans to wean off TPN as intake improves.   Thank you for allowing pharmacy to be a part of this patient's care.  Selinda Eon, PharmD, BCPS Clinical Pharmacist [place] 11/14/2022 10:00 AM  Addendum: TPN discontinued by Surgery D/C TPN labs and associated orders  Thank you for allowing pharmacy to be a part of this patient's care.  Selinda Eon, PharmD, BCPS Clinical Pharmacist [place] 11/14/2022 11:27 AM

## 2022-11-14 NOTE — Progress Notes (Signed)
Inpatient Rehab Admissions Coordinator:   Following for my colleague, Wolfgang Phoenix.  We are awaiting transition off TPN to begin insurance auth, expect soon.  I will not have a bed for this patient to admit today.   Estill Dooms, PT, DPT Admissions Coordinator [phone] 11/14/22  10:34 AM

## 2022-11-14 NOTE — Progress Notes (Signed)
Calorie Count Note  48 hour calorie count ordered.  Diet: Regular Supplements: Ensure Plus High protein BID  Day 1 6/30: Breakfast: 50% Lunch: 50% Dinner: 50% Supplements: - Total intake: 828 kcal (44% of minimum estimated needs)  24 protein (21% of minimum estimated needs)  Day 2 (7/1): Breakfast: 0% Lunch: 75-100% Dinner: nothing documented, patient reports eating something but unsure what and how much Supplements: 1 Ensure Total intake: 873 kcal (46% of minimum estimated needs)  33 protein (29% of minimum estimated needs)  Subjective: Patient reports eating fair yesterday. Didn't have a breakfast but had most of her lunch. Also endorses having something at dinner but nothing documented In calorie count envelope or in flowsheets so unable to calculate.  Documented to have had breakfast today and had 400 calories and 14g of protein + 1/2 Ensure for a total of 575 calories and 24g protein.  Patient motivated to try and eat well to get off TPN and discharge. Encouraged patient to eat something at each meal and try and consume supplements to help meet estimated needs.  Patient discussed with Surgery and RN. Plan to discontinue TPN this evening and monitor intake.   Nutrition Dx: Increased nutrient needs related to acute illness as evidenced by estimated needs.   Goal: Patient will meet greater than or equal to 90% of their needs  Intervention:  - Plan to discontinue TPN today.   - Regular diet.  - Ensure Plus High Protein po BID, each supplement provides 350 kcal and 20 grams of protein. - Encourage intake at all meals and of supplements.     Shelle Iron RD, LDN For contact information, refer to [facility].

## 2022-11-14 NOTE — Progress Notes (Signed)
Occupational Therapy Treatment Patient Details Name: Kaitlyn Bray MRN: [id number] DOB: January 23, 1966 Today's Date: 11/14/2022   History of present illness Patient is a 57 year old female who underwent ventral hernia s/p robotic incisional repair on 6/10 complicated by bowel perforation, sepsis, and peritonitis s/p ex lap on 6/16 and wash out on 6/18, 6/20 with closing of the abdomen on 6/22. Patient was extubated on 6/26. PMH: obesity, diabetes, HTN   OT comments  Pt making good progress with functional goals. Pt stood at RW from EOB mod A +2 to SPT mod A to chair. Pt participated in grooming, hygiene and oral care with set up and Sup. Pt encouraged to participate in as much of her own selfcare as possible with nursing staff and to sit EOB while doing so vs supine in bed and to transfer to [facility] with nursing staff for toileting. No BSC present in room this session; requested a BSC from nursing staff. OT will continue to follow acutely to maximize level of function and safety   Recommendations for follow up therapy are one component of a multi-disciplinary discharge planning process, led by the attending physician.  Recommendations may be updated based on patient status, additional functional criteria and insurance authorization.    Assistance Recommended at Discharge Frequent or constant Supervision/Assistance  Patient can return home with the following  A lot of help with bathing/dressing/bathroom;A lot of help with walking and/or transfers;Assistance with cooking/housework;Help with stairs or ramp for entrance   Equipment Recommendations  BSC/3in1;Other (comment) (A/E kit)    Recommendations for Other Services      Precautions / Restrictions Precautions Precautions: Fall;Other (comment) Precaution Comments: 2 jp drains; abdominal wound vac; abdominal precautions Restrictions Weight Bearing Restrictions: No       Mobility Bed Mobility Overal bed mobility: Needs Assistance Bed Mobility:  Supine to Sit, Rolling Rolling: Min assist Sidelying to sit: Mod assist, +2 for physical assistance       General bed mobility comments: assist trunk to rise, cues for abdominal precautions, use of bed rails to assist    Transfers Overall transfer level: Needs assistance Equipment used: Rolling walker (2 wheels) Transfers: Sit to/from Stand, Bed to chair/wheelchair/BSC Sit to Stand: Mod assist, +2 physical assistance, +2 safety/equipment, From elevated surface     Step pivot transfers: Mod assist     General transfer comment: patient able to rise up from bed with mod assist at RW, able to step  to recliner today using RW.     Balance Overall balance assessment: Needs assistance Sitting-balance support: Feet supported Sitting balance-Leahy Scale: Fair     Standing balance support: Bilateral upper extremity supported, Reliant on assistive device for balance, During functional activity Standing balance-Leahy Scale: Poor                             ADL either performed or assessed with clinical judgement   ADL Overall ADL's : Needs assistance/impaired     Grooming: Wash/dry hands;Wash/dry face;Oral care;Brushing hair;Set up;Supervision/safety;Sitting           Upper Body Dressing : Minimal assistance;Sitting       Toilet Transfer: Moderate assistance;+2 for safety/equipment;Stand-pivot Toilet Transfer Details (indicate cue type and reason): simulated to recliner Toileting- Clothing Manipulation and Hygiene: Maximal assistance;Sit to/from stand       Functional mobility during ADLs: Moderate assistance;+2 for physical assistance;Rolling walker (2 wheels) General ADL Comments: no BSC in present this session, requested BSC from  nursing staff and infomred RN that pt can be assisted to [facility] with SPT mod A    Extremity/Trunk Assessment Upper Extremity Assessment Upper Extremity Assessment: Generalized weakness   Lower Extremity Assessment Lower Extremity  Assessment: Defer to PT evaluation   Cervical / Trunk Assessment Cervical / Trunk Assessment: Other exceptions Cervical / Trunk Exceptions: abdominal vac; increased body habitus    Vision Baseline Vision/History: 1 Wears glasses Ability to See in Adequate Light: 0 Adequate Patient Visual Report: No change from baseline     Perception     Praxis      Cognition Arousal/Alertness: Awake/alert Behavior During Therapy: WFL for tasks assessed/performed, Anxious Overall Cognitive Status: Within Functional Limits for tasks assessed                                          Exercises      Shoulder Instructions       General Comments      Pertinent Vitals/ Pain       Pain Assessment Pain Assessment: Faces Faces Pain Scale: Hurts even more Pain Location: LEs when standing Pain Descriptors / Indicators: Aching, Discomfort, Moaning, Guarding Pain Intervention(s): Monitored during session, Limited activity within patient's tolerance, Repositioned  Home Living                                          Prior Functioning/Environment              Frequency  Min 1X/week        Progress Toward Goals  OT Goals(current goals can now be found in the care plan section)  Progress towards OT goals: Progressing toward goals     Plan Discharge plan remains appropriate    Co-evaluation      Reason for Co-Treatment: Complexity of the patient's impairments (multi-system involvement);For patient/therapist safety;To address functional/ADL transfers   OT goals addressed during session: ADL's and self-care;Proper use of Adaptive equipment and DME      AM-PAC OT "6 Clicks" Daily Activity     Outcome Measure   Help from another person eating meals?: None Help from another person taking care of personal grooming?: A Little Help from another person toileting, which includes using toliet, bedpan, or urinal?: A Lot Help from another person  bathing (including washing, rinsing, drying)?: A Lot Help from another person to put on and taking off regular upper body clothing?: A Little Help from another person to put on and taking off regular lower body clothing?: Total 6 Click Score: 15    End of Session Equipment Utilized During Treatment: Gait belt;Rolling walker (2 wheels)  OT Visit Diagnosis: Unsteadiness on feet (R26.81);Other abnormalities of gait and mobility (R26.89);Muscle weakness (generalized) (M62.81)   Activity Tolerance Patient tolerated treatment well   Patient Left in chair;with call bell/phone within reach   Nurse Communication Mobility status;Other (comment) (can use BSC with staff)        Time: 1610-9604 OT Time Calculation (min): 24 min  Charges: OT General Charges $OT Visit: 1 Visit OT Treatments $Self Care/Home Management : 8-22 mins    Galen Manila 11/14/2022, 2:18 PM

## 2022-11-15 LAB — BASIC METABOLIC PANEL
Anion gap: 11 (ref 5–15)
BUN: 17 mg/dL (ref 6–20)
CO2: 25 mmol/L (ref 22–32)
Calcium: 8.4 mg/dL — ABNORMAL LOW (ref 8.9–10.3)
Chloride: 100 mmol/L (ref 98–111)
Creatinine, Ser: 0.5 mg/dL (ref 0.44–1.00)
GFR, Estimated: >60 mL/min (ref >60)
Glucose, Bld: 111 mg/dL — ABNORMAL HIGH (ref 70–99)
Potassium: 3.9 mmol/L (ref 3.5–5.1)
Sodium: 136 mmol/L (ref 135–145)

## 2022-11-15 LAB — GLUCOSE, CAPILLARY
Glucose-Capillary: 104 mg/dL — ABNORMAL HIGH (ref 70–99)
Glucose-Capillary: 107 mg/dL — ABNORMAL HIGH (ref 70–99)
Glucose-Capillary: 109 mg/dL — ABNORMAL HIGH (ref 70–99)
Glucose-Capillary: 116 mg/dL — ABNORMAL HIGH (ref 70–99)
Glucose-Capillary: 145 mg/dL — ABNORMAL HIGH (ref 70–99)
Glucose-Capillary: 98 mg/dL (ref 70–99)

## 2022-11-15 LAB — CBC
HCT: 27 % — ABNORMAL LOW (ref 36.0–46.0)
Hemoglobin: 8.1 g/dL — ABNORMAL LOW (ref 12.0–15.0)
MCH: 27.6 pg (ref 26.0–34.0)
MCHC: 30 g/dL (ref 30.0–36.0)
MCV: 91.8 fL (ref 80.0–100.0)
Platelets: 414 10*3/uL — ABNORMAL HIGH (ref 150–400)
RBC: 2.94 MIL/uL — ABNORMAL LOW (ref 3.87–5.11)
RDW: 15.8 % — ABNORMAL HIGH (ref 11.5–15.5)
WBC: 11.9 10*3/uL — ABNORMAL HIGH (ref 4.0–10.5)
nRBC: 0 % (ref 0.0–0.2)

## 2022-11-15 MED ORDER — INSULIN ASPART 100 UNIT/ML IJ SOLN
0.0000 [IU] | Freq: Three times a day (TID) | INTRAMUSCULAR | Status: DC
  Administered 2022-11-15 – 2022-11-16 (×2): 2 [IU] via SUBCUTANEOUS
  Administered 2022-11-16: 3 [IU] via SUBCUTANEOUS

## 2022-11-15 MED ORDER — FLUCONAZOLE 100 MG PO TABS
400.0000 mg | ORAL_TABLET | Freq: Every day | ORAL | Status: DC
  Administered 2022-11-16: 400 mg via ORAL
  Filled 2022-11-15: qty 4

## 2022-11-15 MED ORDER — AMOXICILLIN-POT CLAVULANATE 875-125 MG PO TABS
1.0000 | ORAL_TABLET | Freq: Two times a day (BID) | ORAL | Status: DC
  Administered 2022-11-15 – 2022-11-16 (×3): 1 via ORAL
  Filled 2022-11-15 (×3): qty 1

## 2022-11-15 NOTE — Progress Notes (Signed)
Physical Therapy Treatment Patient Details Name: Kaitlyn Bray MRN: [id number] DOB: 1966-05-03 Today's Date: 11/15/2022   History of Present Illness Patient is a 57 year old female who underwent ventral hernia s/p robotic incisional repair on 6/10 complicated by bowel perforation, sepsis, and peritonitis s/p ex lap on 6/16 and wash out on 6/18, 6/20 with closing of the abdomen on 6/22. Patient was extubated on 6/26. PMH: obesity, diabetes, HTN    PT Comments   Pt admitted with above diagnosis.  Pt currently with functional limitations due to the deficits listed below (see PT Problem List). Pt in bed when PT arrived. Pt agreeable to therapy and indicated she would like to get OOB with pt experiencing some back pain and stated she was getting sweaty. Pt required cues for abdominal precautions with supine to sit EOB. Pt demonstrated F seated balance with B LE support EOB. PT assessed safety with use of STEADY lift for increased ease and IND with nursing staff with transfer tasks. Pt demonstrated ability to follow directions for safe and proper use of lift and required min A x 1 and min guard x 1 for safety and management of lines/leads. Pt tolerated static standing on lift with B UE support and min guard for 2;47 with reports of fatigue and transferred to recliner. Pt left seated in recliner and all needs in place.  Pt will benefit from acute skilled PT to increase their independence and safety with mobility to allow discharge.       Assistance Recommended at Discharge Frequent or constant Supervision/Assistance  If plan is discharge home, recommend the following:  Can travel by private vehicle    Two people to help with walking and/or transfers;A lot of help with bathing/dressing/bathroom;Assistance with cooking/housework;Assist for transportation;Help with stairs or ramp for entrance      Equipment Recommendations  None recommended by PT    Recommendations for Other Services Rehab consult      Precautions / Restrictions Precautions Precautions: Fall;Other (comment) Precaution Comments: 2 jp drains; abdominal wound vac; abdominal precautions Restrictions Weight Bearing Restrictions: No     Mobility  Bed Mobility Overal bed mobility: Needs Assistance Bed Mobility: Supine to Sit   Sidelying to sit: Min assist, HOB elevated (increased time)       General bed mobility comments: cues for log roll technique to maintain abdominal precuations    Transfers Overall transfer level: Needs assistance Equipment used:  (STEADY) Transfers: Sit to/from Stand, Bed to chair/wheelchair/BSC Sit to Stand: +2 safety/equipment, From elevated surface, Min assist           General transfer comment: pt able to pull to stand from EOB on STEADY lift with min A x 1 and min guard x 1 for safety, pt demonstrated ability to maintain static standing on lift for 2;47 with close S and min cues. pt tolerated transfer well to recliner and able to follow commands for safe and proper technique. PT provided nursing ed on use of STEADY lift to return to bed    Ambulation/Gait               General Gait Details: NT   Stairs             Wheelchair Mobility     Tilt Bed    Modified Rankin (Stroke Patients Only)       Balance Overall balance assessment: Needs assistance Sitting-balance support: Feet supported Sitting balance-Leahy Scale: Fair     Standing balance support: Bilateral upper extremity supported,  Reliant on assistive device for balance, During functional activity Standing balance-Leahy Scale: Poor Standing balance comment: reliant on UE support.                            Cognition Arousal/Alertness: Awake/alert Behavior During Therapy: WFL for tasks assessed/performed, Anxious Overall Cognitive Status: Within Functional Limits for tasks assessed                                 General Comments: slow processing        Exercises       General Comments        Pertinent Vitals/Pain Pain Assessment Pain Assessment: Faces Faces Pain Scale: Hurts little more Breathing: normal Negative Vocalization: none Facial Expression: smiling or inexpressive Body Language: relaxed Consolability: no need to console PAINAD Score: 0 Pain Location: LEs when standing and back when in bed Pain Descriptors / Indicators: Aching, Discomfort, Moaning, Guarding Pain Intervention(s): Limited activity within patient's tolerance, Monitored during session, Repositioned    Home Living                          Prior Function            PT Goals (current goals can now be found in the care plan section) Acute Rehab PT Goals Patient Stated Goal: go home PT Goal Formulation: With patient Time For Goal Achievement: 11/24/22 Potential to Achieve Goals: Good Progress towards PT goals: Progressing toward goals    Frequency    Min 1X/week      PT Plan Current plan remains appropriate    Co-evaluation              AM-PAC PT "6 Clicks" Mobility   Outcome Measure  Help needed turning from your back to your side while in a flat bed without using bedrails?: A Little Help needed moving from lying on your back to sitting on the side of a flat bed without using bedrails?: A Little Help needed moving to and from a bed to a chair (including a wheelchair)?: A Lot Help needed standing up from a chair using your arms (e.g., wheelchair or bedside chair)?: A Lot Help needed to walk in hospital room?: Total Help needed climbing 3-5 steps with a railing? : Total 6 Click Score: 12    End of Session Equipment Utilized During Treatment: Gait belt Activity Tolerance: Patient tolerated treatment well Patient left: in chair;with call bell/phone within reach;with chair alarm set Nurse Communication: Mobility status;Need for lift equipment PT Visit Diagnosis: Unsteadiness on feet (R26.81)     Time: 1610-9604 PT Time Calculation  (min) (ACUTE ONLY): 28 min  Charges:    $Therapeutic Activity: 23-37 mins PT General Charges $$ ACUTE PT VISIT: 1 Visit                     Johnny Bridge, PT Acute Rehab    Jacqualyn Posey 11/15/2022, 5:45 PM

## 2022-11-15 NOTE — Progress Notes (Signed)
11 Days Post-Op   Subjective/Chief Complaint: Tolerating food.  No major changes.  Objective: Vital signs in last 24 hours: Temp:  [98.1 F (36.7 C)-98.7 F (37.1 C)] 98.6 F (37 C) (07/03 0529) Pulse Rate:  [79-91] 79 (07/03 0529) Resp:  [17-18] 18 (07/03 0529) BP: (134-152)/(79-85) 143/79 (07/03 0529) SpO2:  [96 %-100 %] 97 % (07/03 0529) Weight:  [100.6 kg] 100.6 kg (07/03 0500) Last BM Date : 11/14/22  Intake/Output from previous day: 07/02 0701 - 07/03 0700 In: 2777.8 [P.O.:1920; I.V.:357.8; IV Piggyback:500] Out: 3020 [Urine:2950; Drains:70] Intake/Output this shift: No intake/output data recorded.  Exam: Abdomen soft, Wound vac holding suction with serosanguinous drainage  Lab Results:  Recent Labs    11/14/22 0434 11/15/22 0339  WBC 13.1* 11.9*  HGB 8.1* 8.1*  HCT 27.7* 27.0*  PLT 434* 414*    BMET Recent Labs    11/14/22 0434 11/15/22 0339  NA 135 136  K 3.8 3.9  CL 102 100  CO2 24 25  GLUCOSE 132* 111*  BUN 24* 17  CREATININE 0.57 0.50  CALCIUM 8.3* 8.4*    PT/INR No results for input(s): "LABPROT", "INR" in the last 72 hours. ABG No results for input(s): "PHART", "HCO3" in the last 72 hours.  Invalid input(s): "PCO2", "PO2"  Studies/Results: CT ABDOMEN PELVIS W CONTRAST  Result Date: 11/14/2022 CLINICAL DATA:  Small bowel perforation and intra-abdominal abscess. EXAM: CT ABDOMEN AND PELVIS WITH CONTRAST TECHNIQUE: Multidetector CT imaging of the abdomen and pelvis was performed using the standard protocol following bolus administration of intravenous contrast. RADIATION DOSE REDUCTION: This exam was performed according to the departmental dose-optimization program which includes automated exposure control, adjustment of the mA and/or kV according to patient size and/or use of iterative reconstruction technique. CONTRAST:  OMNIPAQUE IOHEXOL 300 MG/ML  SOLN COMPARISON:  CT abdomen pelvis dated 07/26/2022. FINDINGS: Lower chest: The  visualized lung bases are clear. No intra-abdominal free air. Hepatobiliary: Fatty infiltration of the liver. No biliary dilatation. The gallbladder is unremarkable. Pancreas: Unremarkable. No pancreatic ductal dilatation or surrounding inflammatory changes. Spleen: Normal in size without focal abnormality. Adrenals/Urinary Tract: The adrenal glands unremarkable. There is no hydronephrosis on either side. There is symmetric enhancement and excretion of contrast by both kidneys. The visualized ureters and urinary bladder appear unremarkable small focus of air within the bladder, likely introduced via recent catheterization. Stomach/Bowel: There is sigmoid diverticulosis without active inflammatory changes. There is no bowel obstruction or active inflammation. No CT evidence of acute appendicitis. Vascular/Lymphatic: Mild atherosclerotic calcification of the abdominal aorta. The IVC is unremarkable. No portal venous gas. There is no adenopathy. Reproductive: Hysterectomy.  No adnexal masses. Other: There is diffuse mesenteric edema as well as omental nodularity. There is a 5.8 x 4.6 cm loculated appearing fluid in the left anterior lower abdomen concerning for an infected fluid or developing abscess. This collection may communicate with the anterior pelvic wall open wound. Additional smaller loculated fluid within the mesentery in the anterior lower abdomen, one abutting the small bowel and the other abutting the medial wall of the ascending colon. Two drainage catheters with tips terminating in the left hemipelvis. No fluid collection noted along the course or adjacent to the tips of the drainage catheters. Musculoskeletal: Anterior pelvic wall open wound and wound VAC. No acute osseous pathology. IMPRESSION: 1. Loculated appearing fluid in the left anterior lower abdomen concerning for an infected fluid or developing abscess. This collection may communicate with the anterior pelvic wall open wound. Smaller loculated  collections in the lower abdomen. 2. Two drainage catheters with tips terminating in the left hemipelvis. No fluid collection noted along the course or adjacent to the tips of the drainage catheters. 3. Sigmoid diverticulosis. No bowel obstruction. 4. Fatty liver. 5.  Aortic Atherosclerosis (ICD10-I70.0). Electronically Signed   By: Elgie Collard M.D.   On: 11/14/2022 18:34    Anti-infectives: Anti-infectives (From admission, onward)    Start     Dose/Rate Route Frequency Ordered Stop   11/14/22 0830  Ampicillin-Sulbactam (UNASYN) 3 g in sodium chloride 0.9 % 100 mL IVPB        3 g 200 mL/hr over 30 Minutes Intravenous Every 6 hours 11/14/22 0740     11/05/22 1215  vancomycin (VANCOREADY) IVPB 1750 mg/350 mL  Status:  Discontinued        1,750 mg 175 mL/hr over 120 Minutes Intravenous Every 24 hours 11/04/22 1125 11/08/22 1023   11/04/22 1215  vancomycin (VANCOREADY) IVPB 2000 mg/400 mL        2,000 mg 200 mL/hr over 120 Minutes Intravenous STAT 11/04/22 1125 11/04/22 1402   11/04/22 1000  fluconazole (DIFLUCAN) IVPB 400 mg        400 mg 100 mL/hr over 120 Minutes Intravenous Every 24 hours 11/03/22 0801     11/03/22 0845  fluconazole (DIFLUCAN) IVPB 800 mg        800 mg 100 mL/hr over 240 Minutes Intravenous  Once 11/03/22 0759 11/03/22 1327   10/28/22 1000  azithromycin (ZITHROMAX) tablet 250 mg  Status:  Discontinued       See Hyperspace for full Linked Orders Report.   250 mg Oral Daily 10/27/22 0738 10/27/22 1653   10/27/22 1700  piperacillin-tazobactam (ZOSYN) IVPB 3.375 g  Status:  Discontinued        3.375 g 12.5 mL/hr over 240 Minutes Intravenous Every 8 hours 10/27/22 1653 11/14/22 0740   10/27/22 1000  azithromycin (ZITHROMAX) tablet 500 mg       See Hyperspace for full Linked Orders Report.   500 mg Oral Daily 10/27/22 0738 10/27/22 0946   10/23/22 1054  ceFAZolin (ANCEF) 2-4 GM/100ML-% IVPB       Note to Pharmacy: Augustine Radar R: cabinet override      10/23/22 1054  10/23/22 2259   10/23/22 0600  ceFAZolin (ANCEF) IVPB 3g/100 mL premix        3 g 200 mL/hr over 30 Minutes Intravenous On call to O.R. 10/23/22 0540 10/23/22 1213       Assessment/Plan: Kaitlyn Bray underwent difficult robotic recurrent incisional hernia repair with mesh, bilateral posterior rectus myofascial release, bilateral transversus abdominis release and removal of previous polypropylene mesh with prolonged lyses of adhesions on 10/23/22. This was complicated by bowel perforation, possible missed enterotomy during dissection of small intestine off mesh, requiring return to the operating room with washout, small bowel resection and ABTHERA placement on 10/29/22.  Returned to the OR for wash out 6/18 and 6/20.  Underwent bridging retromuscular Phasix mesh closure of the abdomen 11/04/22.  ID consult for polymicrobial infection - continue antibiotics - CT with appropriate appearing fluid collections for this time post op.  I would not aspirate or place drains in these as they lie beneath the mesh and in the mesentery and do not appear infected.  They will likely resolve and/or work their way to the wound vac.  Remove JP drains  Antibiotic plan per ID Vac changed 7/1 by [facility] team - appreciate assistance and care PT/OT Off  TPN and tolerating diet  Critical care signed off, appreciate assistance ID following  Dispo - Medically stable for discharge to CIR once antibiotic plan is finalized by infectious disease team  [name], MD  11/15/2022

## 2022-11-15 NOTE — Consult Note (Addendum)
WOC Nurse follow-up Consult Note: Reason for Consult: Vac dressing change performed.  Pt was medicated for pain prior to the procedure and she tolerated with minimal amt discomfort.  Wound type: Full thickness midline post-op abd wound; refer to previous progress notes for measurements. Mesh visible to woundbed with undermining as previously noted.  Drainage (amount, consistency, odor) mod amt yellow drainage in the cannister Periwound: intact Dressing procedure/placement/frequency: Applied Mepitel contact layer to protect the wound bed, then 3 pieces of white foam, over the wound bed and tucked into undermining areas, then one piece of black foam to cont suction.  WOC team will plan to change the dressing again on Fri. Thank-you,  Cammie Mcgee MSN, RN, CWOCN, [place], CNS [phone]

## 2022-11-15 NOTE — Progress Notes (Addendum)
Inpatient Rehab Admissions Coordinator:   Started insurance auth request this AM.  Attempted to reach spouse to update but no answer and voicemail box full.    1459: Received updated contact info for spouse [phone]).  Spoke to spouse, pt, and son in law on the phone to discuss CIR and insurance approval.  All are in agreement to proceed with potential admit tomorrow, pending readiness and bed availability.  Will f/u in the AM.    Estill Dooms, PT, DPT Admissions Coordinator [phone] 11/15/22  8:06 AM

## 2022-11-15 NOTE — Progress Notes (Signed)
Subjective:  No new complaints   Antibiotics:  Anti-infectives (From admission, onward)    Start     Dose/Rate Route Frequency Ordered Stop   11/16/22 1000  fluconazole (DIFLUCAN) tablet 400 mg        400 mg Oral Daily 11/15/22 1013     11/15/22 1200  amoxicillin-clavulanate (AUGMENTIN) 875-125 MG per tablet 1 tablet        1 tablet Oral Every 12 hours 11/15/22 1013     11/14/22 0830  Ampicillin-Sulbactam (UNASYN) 3 g in sodium chloride 0.9 % 100 mL IVPB  Status:  Discontinued        3 g 200 mL/hr over 30 Minutes Intravenous Every 6 hours 11/14/22 0740 11/15/22 1013   11/05/22 1215  vancomycin (VANCOREADY) IVPB 1750 mg/350 mL  Status:  Discontinued        1,750 mg 175 mL/hr over 120 Minutes Intravenous Every 24 hours 11/04/22 1125 11/08/22 1023   11/04/22 1215  vancomycin (VANCOREADY) IVPB 2000 mg/400 mL        2,000 mg 200 mL/hr over 120 Minutes Intravenous STAT 11/04/22 1125 11/04/22 1402   11/04/22 1000  fluconazole (DIFLUCAN) IVPB 400 mg  Status:  Discontinued        400 mg 100 mL/hr over 120 Minutes Intravenous Every 24 hours 11/03/22 0801 11/15/22 1013   11/03/22 0845  fluconazole (DIFLUCAN) IVPB 800 mg        800 mg 100 mL/hr over 240 Minutes Intravenous  Once 11/03/22 0759 11/03/22 1327   10/28/22 1000  azithromycin (ZITHROMAX) tablet 250 mg  Status:  Discontinued       See Hyperspace for full Linked Orders Report.   250 mg Oral Daily 10/27/22 0738 10/27/22 1653   10/27/22 1700  piperacillin-tazobactam (ZOSYN) IVPB 3.375 g  Status:  Discontinued        3.375 g 12.5 mL/hr over 240 Minutes Intravenous Every 8 hours 10/27/22 1653 11/14/22 0740   10/27/22 1000  azithromycin (ZITHROMAX) tablet 500 mg       See Hyperspace for full Linked Orders Report.   500 mg Oral Daily 10/27/22 0738 10/27/22 0946   10/23/22 1054  ceFAZolin (ANCEF) 2-4 GM/100ML-% IVPB       Note to Pharmacy: Augustine Radar R: cabinet override      10/23/22 1054 10/23/22 2259   10/23/22 0600   ceFAZolin (ANCEF) IVPB 3g/100 mL premix        3 g 200 mL/hr over 30 Minutes Intravenous On call to O.R. 10/23/22 0540 10/23/22 1213       Medications: Scheduled Meds:  acetaminophen  1,000 mg Oral Q6H   amoxicillin-clavulanate  1 tablet Oral Q12H   ascorbic acid  500 mg Oral Daily   Chlorhexidine Gluconate Cloth  6 each Topical Daily   vitamin B-12  500 mcg Oral Daily   enoxaparin (LOVENOX) injection  40 mg Subcutaneous Q24H   famotidine  20 mg Oral BID   feeding supplement  237 mL Oral BID BM   [START ON 11/16/2022] fluconazole  400 mg Oral Daily   gabapentin  400 mg Oral TID   losartan  25-50 mg Oral Daily   And   hydrochlorothiazide  6.25-12.5 mg Oral Daily   insulin aspart  0-15 Units Subcutaneous TID AC & HS   liver oil-zinc oxide   Topical BID   loperamide  2 mg Oral Once   nystatin   Topical BID   polycarbophil  625 mg Oral  BID   sodium chloride flush  10-40 mL Intracatheter Q12H   Continuous Infusions:  sodium chloride 10 mL/hr at 11/13/22 1600   methocarbamol (ROBAXIN) IV     PRN Meds:.albuterol, bismuth subsalicylate, HYDROmorphone (DILAUDID) injection, lidocaine, meclizine, methocarbamol (ROBAXIN) IV, ondansetron (ZOFRAN) IV, oxyCODONE, oxyCODONE, prochlorperazine, simethicone, sodium chloride, sodium chloride flush    Objective: Weight change: -5.4 kg  Intake/Output Summary (Last 24 hours) at 11/15/2022 1036 Last data filed at 11/15/2022 0914 Gross per 24 hour  Intake 2387.84 ml  Output 3170 ml  Net -782.16 ml    Blood pressure (!) 143/79, pulse 79, temperature 98.6 F (37 C), temperature source Oral, resp. rate 18, height 5\' 5"  (1.651 m), weight 100.6 kg, SpO2 97 %. Temp:  [98.1 F (36.7 C)-98.7 F (37.1 C)] 98.6 F (37 C) (07/03 0529) Pulse Rate:  [79-91] 79 (07/03 0529) Resp:  [17-18] 18 (07/03 0529) BP: (134-152)/(79-85) 143/79 (07/03 0529) SpO2:  [96 %-100 %] 97 % (07/03 0529) Weight:  [100.6 kg] 100.6 kg (07/03 0500)  Physical Exam: Physical  Exam Constitutional:      General: She is not in acute distress.    Appearance: She is not ill-appearing.  HENT:     Head: Normocephalic and atraumatic.  Pulmonary:     Effort: Pulmonary effort is normal. No respiratory distress.     Breath sounds: No wheezing.  Abdominal:     General: Abdomen is flat.  Musculoskeletal:        General: Normal range of motion.     Cervical back: Normal range of motion.  Skin:    General: Skin is warm.     Coloration: Skin is pale.  Neurological:     General: No focal deficit present.     Mental Status: She is alert and oriented to person, place, and time.  Psychiatric:        Mood and Affect: Mood normal.        Behavior: Behavior normal.        Thought Content: Thought content normal.        Judgment: Judgment normal.     Vacuum dressing in place    CBC:    BMET Recent Labs    11/14/22 0434 11/15/22 0339  NA 135 136  K 3.8 3.9  CL 102 100  CO2 24 25  GLUCOSE 132* 111*  BUN 24* 17  CREATININE 0.57 0.50  CALCIUM 8.3* 8.4*      Liver Panel  Recent Labs    11/13/22 0500  PROT 6.5  ALBUMIN 1.9*  AST 24  ALT 28  ALKPHOS 101  BILITOT 0.2*        Sedimentation Rate No results for input(s): "ESRSEDRATE" in the last 72 hours. C-Reactive Protein No results for input(s): "CRP" in the last 72 hours.  Micro Results: Recent Results (from the past 720 hour(s))  MRSA Next Gen by PCR, Nasal     Status: None   Collection Time: 10/29/22  4:49 PM   Specimen: Nasal Mucosa; Nasal Swab  Result Value Ref Range Status   MRSA by PCR Next Gen NOT DETECTED NOT DETECTED Final    Comment: (NOTE) The GeneXpert MRSA Assay (FDA approved for NASAL specimens only), is one component of a comprehensive MRSA colonization surveillance program. It is not intended to diagnose MRSA infection nor to guide or monitor treatment for MRSA infections. Test performance is not FDA approved in patients less than 33 years old. Performed at [facility], [address], [place], [place] [address]   Aerobic/Anaerobic Culture w Gram Stain (surgical/deep wound)     Status: None   Collection Time: 10/29/22  6:33 PM   Specimen: Path fluid; Body Fluid  Result Value Ref Range Status   Specimen Description FLUID ABDOMEN  Final   Special Requests SWAB ID A  Final   Gram Stain   Final    NO WBC SEEN FEW GRAM NEGATIVE RODS RARE GRAM POSITIVE RODS RARE GRAM POSITIVE COCCI Performed at [facility], [address], [place], [place] [address]    Culture   Final    MODERATE STREPTOCOCCUS GROUP G Beta hemolytic streptococci are predictably susceptible to penicillin and other beta lactams. Susceptibility testing not routinely performed. MODERATE ACTINOMYCES SPECIES Standardized susceptibility testing for this organism is not available. ABUNDANT BACTEROIDES OVATUS BETA LACTAMASE POSITIVE MODERATE CANDIDA ALBICANS    Report Status 11/02/2022 FINAL  Final  Culture, blood (Routine X 2) w Reflex to ID Panel     Status: None   Collection Time: 11/03/22 10:18 AM   Specimen: BLOOD LEFT ARM  Result Value Ref Range Status   Specimen Description   Final    BLOOD LEFT ARM Performed at [facility], [address] [place], [place] [address]    Special Requests   Final    BOTTLES DRAWN AEROBIC ONLY Blood Culture adequate volume Performed at [facility], [address], [place], [place] [address]    Culture   Final    NO GROWTH 5 DAYS Performed at [facility], [address], [place], [place] [address]    Report Status 11/08/2022 FINAL  Final  Culture, blood (Routine X 2) w Reflex to ID Panel     Status: None   Collection Time: 11/03/22 10:20 AM   Specimen: BLOOD LEFT HAND  Result Value Ref Range Status   Specimen Description   Final    BLOOD LEFT HAND Performed at [facility], [address], [place], [place] [address]    Special Requests   Final     BOTTLES DRAWN AEROBIC ONLY Blood Culture results may not be optimal due to an inadequate volume of blood received in culture bottles Performed at [facility], [address] [place], [place] [address]    Culture   Final    NO GROWTH 5 DAYS Performed at [facility], [address], [place], [place] [address]    Report Status 11/08/2022 FINAL  Final    Studies/Results: CT ABDOMEN PELVIS W CONTRAST  Result Date: 11/14/2022 CLINICAL DATA:  Small bowel perforation and intra-abdominal abscess. EXAM: CT ABDOMEN AND PELVIS WITH CONTRAST TECHNIQUE: Multidetector CT imaging of the abdomen and pelvis was performed using the standard protocol following bolus administration of intravenous contrast. RADIATION DOSE REDUCTION: This exam was performed according to the departmental dose-optimization program which includes automated exposure control, adjustment of the mA and/or kV according to patient size and/or use of iterative reconstruction technique. CONTRAST:  OMNIPAQUE IOHEXOL 300 MG/ML  SOLN COMPARISON:  CT abdomen pelvis dated 07/26/2022. FINDINGS: Lower chest: The visualized lung bases are clear. No intra-abdominal free air. Hepatobiliary: Fatty infiltration of the liver. No biliary dilatation. The gallbladder is unremarkable. Pancreas: Unremarkable. No pancreatic ductal dilatation or surrounding inflammatory changes. Spleen: Normal in size without focal abnormality. Adrenals/Urinary Tract: The adrenal glands unremarkable. There is no hydronephrosis on either side. There is symmetric enhancement and excretion of contrast by both kidneys. The  visualized ureters and urinary bladder appear unremarkable small focus of air within the bladder, likely introduced via recent catheterization. Stomach/Bowel: There is sigmoid diverticulosis without active inflammatory changes. There is no bowel obstruction or active inflammation. No CT evidence of acute appendicitis. Vascular/Lymphatic:  Mild atherosclerotic calcification of the abdominal aorta. The IVC is unremarkable. No portal venous gas. There is no adenopathy. Reproductive: Hysterectomy.  No adnexal masses. Other: There is diffuse mesenteric edema as well as omental nodularity. There is a 5.8 x 4.6 cm loculated appearing fluid in the left anterior lower abdomen concerning for an infected fluid or developing abscess. This collection may communicate with the anterior pelvic wall open wound. Additional smaller loculated fluid within the mesentery in the anterior lower abdomen, one abutting the small bowel and the other abutting the medial wall of the ascending colon. Two drainage catheters with tips terminating in the left hemipelvis. No fluid collection noted along the course or adjacent to the tips of the drainage catheters. Musculoskeletal: Anterior pelvic wall open wound and wound VAC. No acute osseous pathology. IMPRESSION: 1. Loculated appearing fluid in the left anterior lower abdomen concerning for an infected fluid or developing abscess. This collection may communicate with the anterior pelvic wall open wound. Smaller loculated collections in the lower abdomen. 2. Two drainage catheters with tips terminating in the left hemipelvis. No fluid collection noted along the course or adjacent to the tips of the drainage catheters. 3. Sigmoid diverticulosis. No bowel obstruction. 4. Fatty liver. 5.  Aortic Atherosclerosis (ICD10-I70.0). Electronically Signed   By: Elgie Collard M.D.   On: 11/14/2022 18:34      Assessment/Plan:  INTERVAL HISTORY:  CT scan done yesterday  Principal Problem:   Small bowel perforation (HCC) Active Problems:   HTN (hypertension)   Asthma   BPPV (benign paroxysmal positional vertigo)   Recurrent ventral hernia   Morbid obesity (HCC)   Polyarthralgia   New onset type 2 diabetes mellitus (HCC)   Peritonitis (HCC)   Sepsis due to Candida species (HCC)   Physical deconditioning   Protein-calorie  malnutrition, moderate (HCC)    Kaitlyn Bray is a 57 y.o. female who developed post robotic incisional hernia repair, feculent peritonitis obotic incisional hernia repair with mesh ( vicryl mesh patch of posterior layer of repair) , removal of previous intraperitoneal mesh and lysis of adhesions, bilateral transversus abdominis and bilateral posterior rectus myofascial release on 6/10 who had been progressing well until started developing fever and drainage of foul-smelling fluid from her lower abdominal incision site She underwent OR for exploratory ex lap with resection of small bowel perforation and primary anastomosis and excision of mesh with placement of ABThera wound VAC on 6/18  She went back on 11/04/2022 and all prior mesh removed and absorbable mesh placed  Cultures with group G streptococci, actinomyecs, bacteroides ovatus beta lactamase positive and candida albicans   Some Zosyn to Unasyn of continuous fluconazole.  We obtained a CT scan of the abdomen pelvis to make sure she did not have any other known abscesses.  CT scan shows a loculated fluid collection in the anterior lower abdomen that radiology felt could be due to developing abscess.  It apparently may communicate with the anterior pelvic wall open wound.  There is other small loculated collections in the lower abdomen with 2 drainage catheters terminating in the left hemipelvis.  Dr.Stechschulte has reviewed and feels that collection seen on CT are fairly expected findings and not likely to be infected.  He did  not want to aspirate or placed drains in them as they lie underneath the mesh in the mesentery.  I think we should err on the side of caution and give her at least a month of Augmentin and fluconazole with repeat CT scan to make sure that all of these fluid collections are resolving prior to discontinuing antibiotics.   I have personally spent 50 minutes involved in face-to-face and non-face-to-face  activities for this patient on the day of the visit. Professional time spent includes the following activities: Preparing to see the patient (review of tests), Obtaining and/or reviewing separately obtained history (admission/discharge record), Performing a medically appropriate examination and/or evaluation , Ordering medications/tests/procedures, referring and communicating with other health care professionals, Documenting clinical information in the EMR, Independently interpreting results (not separately reported), Communicating results to the patient/family/caregiver, Counseling and educating the patient/family/caregiver and Care coordination (not separately reported).     She is going to be discharged to CIR.  I would like it if someone can reach out to our team (call MD at [facility] if patient needs to be seen or on call ID MD to arrange a hospital followup prior to DC from CIR   I will sign off for now please call with further questions.     LOS: 23 days   Acey Lav 11/15/2022, 10:36 AM

## 2022-11-16 ENCOUNTER — Inpatient Hospital Stay (HOSPITAL_COMMUNITY)
Admission: RE | Admit: 2022-11-16 | Discharge: 2022-11-24 | DRG: 945 | Disposition: A | Discharge status: Home Health | Payer: 59 | Source: Other Acute Inpatient Hospital | Attending: Physical Medicine and Rehabilitation | Admitting: Physical Medicine and Rehabilitation

## 2022-11-16 DIAGNOSIS — Z91040 Latex allergy status: Secondary | ICD-10-CM

## 2022-11-16 DIAGNOSIS — I10 Essential (primary) hypertension: Secondary | ICD-10-CM | POA: Diagnosis present

## 2022-11-16 DIAGNOSIS — M79671 Pain in right foot: Secondary | ICD-10-CM | POA: Diagnosis not present

## 2022-11-16 DIAGNOSIS — Z8249 Family history of ischemic heart disease and other diseases of the circulatory system: Secondary | ICD-10-CM | POA: Diagnosis not present

## 2022-11-16 DIAGNOSIS — Z7984 Long term (current) use of oral hypoglycemic drugs: Secondary | ICD-10-CM

## 2022-11-16 DIAGNOSIS — K631 Perforation of intestine (nontraumatic): Secondary | ICD-10-CM | POA: Diagnosis not present

## 2022-11-16 DIAGNOSIS — Z90710 Acquired absence of both cervix and uterus: Secondary | ICD-10-CM | POA: Diagnosis not present

## 2022-11-16 DIAGNOSIS — Z79899 Other long term (current) drug therapy: Secondary | ICD-10-CM

## 2022-11-16 DIAGNOSIS — E669 Obesity, unspecified: Secondary | ICD-10-CM | POA: Diagnosis present

## 2022-11-16 DIAGNOSIS — Z713 Dietary counseling and surveillance: Secondary | ICD-10-CM | POA: Diagnosis not present

## 2022-11-16 DIAGNOSIS — K5900 Constipation, unspecified: Secondary | ICD-10-CM | POA: Diagnosis not present

## 2022-11-16 DIAGNOSIS — R739 Hyperglycemia, unspecified: Secondary | ICD-10-CM | POA: Diagnosis not present

## 2022-11-16 DIAGNOSIS — Z90721 Acquired absence of ovaries, unilateral: Secondary | ICD-10-CM | POA: Diagnosis not present

## 2022-11-16 DIAGNOSIS — Z882 Allergy status to sulfonamides status: Secondary | ICD-10-CM | POA: Diagnosis not present

## 2022-11-16 DIAGNOSIS — Z9079 Acquired absence of other genital organ(s): Secondary | ICD-10-CM

## 2022-11-16 DIAGNOSIS — D62 Acute posthemorrhagic anemia: Secondary | ICD-10-CM | POA: Diagnosis not present

## 2022-11-16 DIAGNOSIS — T8131XD Disruption of external operation (surgical) wound, not elsewhere classified, subsequent encounter: Secondary | ICD-10-CM | POA: Diagnosis not present

## 2022-11-16 DIAGNOSIS — Z794 Long term (current) use of insulin: Secondary | ICD-10-CM | POA: Diagnosis not present

## 2022-11-16 DIAGNOSIS — Z885 Allergy status to narcotic agent status: Secondary | ICD-10-CM

## 2022-11-16 DIAGNOSIS — Z6836 Body mass index (BMI) 36.0-36.9, adult: Secondary | ICD-10-CM | POA: Diagnosis not present

## 2022-11-16 DIAGNOSIS — R5381 Other malaise: Principal | ICD-10-CM | POA: Diagnosis present

## 2022-11-16 DIAGNOSIS — E119 Type 2 diabetes mellitus without complications: Secondary | ICD-10-CM | POA: Diagnosis present

## 2022-11-16 DIAGNOSIS — E1165 Type 2 diabetes mellitus with hyperglycemia: Secondary | ICD-10-CM | POA: Diagnosis not present

## 2022-11-16 LAB — CBC WITH DIFFERENTIAL/PLATELET
Abs Immature Granulocytes: 0.14 10*3/uL — ABNORMAL HIGH (ref 0.00–0.07)
Basophils Absolute: 0.1 10*3/uL (ref 0.0–0.1)
Basophils Relative: 1 %
Eosinophils Absolute: 0.3 10*3/uL (ref 0.0–0.5)
Eosinophils Relative: 3 %
HCT: 27.6 % — ABNORMAL LOW (ref 36.0–46.0)
Hemoglobin: 8.3 g/dL — ABNORMAL LOW (ref 12.0–15.0)
Immature Granulocytes: 1 %
Lymphocytes Relative: 17 %
Lymphs Abs: 2 10*3/uL (ref 0.7–4.0)
MCH: 26.9 pg (ref 26.0–34.0)
MCHC: 30.1 g/dL (ref 30.0–36.0)
MCV: 89.6 fL (ref 80.0–100.0)
Monocytes Absolute: 1.1 10*3/uL — ABNORMAL HIGH (ref 0.1–1.0)
Monocytes Relative: 9 %
Neutro Abs: 8.4 10*3/uL — ABNORMAL HIGH (ref 1.7–7.7)
Neutrophils Relative %: 69 %
Platelets: 386 10*3/uL (ref 150–400)
RBC: 3.08 MIL/uL — ABNORMAL LOW (ref 3.87–5.11)
RDW: 15.6 % — ABNORMAL HIGH (ref 11.5–15.5)
WBC: 12 10*3/uL — ABNORMAL HIGH (ref 4.0–10.5)
nRBC: 0 % (ref 0.0–0.2)

## 2022-11-16 LAB — COMPREHENSIVE METABOLIC PANEL
ALT: 31 U/L (ref 0–44)
AST: 25 U/L (ref 15–41)
Albumin: 1.8 g/dL — ABNORMAL LOW (ref 3.5–5.0)
Alkaline Phosphatase: 118 U/L (ref 38–126)
Anion gap: 12 (ref 5–15)
BUN: 14 mg/dL (ref 6–20)
CO2: 26 mmol/L (ref 22–32)
Calcium: 8.4 mg/dL — ABNORMAL LOW (ref 8.9–10.3)
Chloride: 97 mmol/L — ABNORMAL LOW (ref 98–111)
Creatinine, Ser: 0.55 mg/dL (ref 0.44–1.00)
GFR, Estimated: >60 mL/min (ref >60)
Glucose, Bld: 103 mg/dL — ABNORMAL HIGH (ref 70–99)
Potassium: 3.4 mmol/L — ABNORMAL LOW (ref 3.5–5.1)
Sodium: 135 mmol/L (ref 135–145)
Total Bilirubin: 0.5 mg/dL (ref 0.3–1.2)
Total Protein: 6.6 g/dL (ref 6.5–8.1)

## 2022-11-16 LAB — BASIC METABOLIC PANEL
Anion gap: 11 (ref 5–15)
BUN: 15 mg/dL (ref 6–20)
CO2: 27 mmol/L (ref 22–32)
Calcium: 8.2 mg/dL — ABNORMAL LOW (ref 8.9–10.3)
Chloride: 98 mmol/L (ref 98–111)
Creatinine, Ser: 0.52 mg/dL (ref 0.44–1.00)
GFR, Estimated: >60 mL/min (ref >60)
Glucose, Bld: 110 mg/dL — ABNORMAL HIGH (ref 70–99)
Potassium: 3.6 mmol/L (ref 3.5–5.1)
Sodium: 136 mmol/L (ref 135–145)

## 2022-11-16 LAB — CBC
HCT: 27.8 % — ABNORMAL LOW (ref 36.0–46.0)
Hemoglobin: 8.2 g/dL — ABNORMAL LOW (ref 12.0–15.0)
MCH: 27.2 pg (ref 26.0–34.0)
MCHC: 29.5 g/dL — ABNORMAL LOW (ref 30.0–36.0)
MCV: 92.1 fL (ref 80.0–100.0)
Platelets: 386 10*3/uL (ref 150–400)
RBC: 3.02 MIL/uL — ABNORMAL LOW (ref 3.87–5.11)
RDW: 15.8 % — ABNORMAL HIGH (ref 11.5–15.5)
WBC: 11.3 10*3/uL — ABNORMAL HIGH (ref 4.0–10.5)
nRBC: 0 % (ref 0.0–0.2)

## 2022-11-16 LAB — GLUCOSE, CAPILLARY
Glucose-Capillary: 125 mg/dL — ABNORMAL HIGH (ref 70–99)
Glucose-Capillary: 158 mg/dL — ABNORMAL HIGH (ref 70–99)

## 2022-11-16 MED ORDER — VITAMIN B-12 1000 MCG PO TABS
500.0000 ug | ORAL_TABLET | Freq: Every day | ORAL | Status: DC
  Administered 2022-11-17 – 2022-11-24 (×8): 500 ug via ORAL
  Filled 2022-11-16 (×8): qty 1

## 2022-11-16 MED ORDER — ENOXAPARIN SODIUM 40 MG/0.4ML IJ SOSY
40.0000 mg | PREFILLED_SYRINGE | INTRAMUSCULAR | Status: DC
  Administered 2022-11-17 – 2022-11-21 (×5): 40 mg via SUBCUTANEOUS
  Filled 2022-11-16 (×5): qty 0.4

## 2022-11-16 MED ORDER — HYDROCHLOROTHIAZIDE 12.5 MG PO TABS
6.2500 mg | ORAL_TABLET | Freq: Every day | ORAL | Status: DC
  Administered 2022-11-17 – 2022-11-18 (×2): 12.5 mg via ORAL
  Administered 2022-11-19: 6.25 mg via ORAL
  Administered 2022-11-20 – 2022-11-24 (×5): 12.5 mg via ORAL
  Filled 2022-11-16 (×8): qty 1

## 2022-11-16 MED ORDER — VITAMIN C 500 MG PO TABS
500.0000 mg | ORAL_TABLET | Freq: Every day | ORAL | Status: DC
  Administered 2022-11-17 – 2022-11-24 (×8): 500 mg via ORAL
  Filled 2022-11-16 (×8): qty 1

## 2022-11-16 MED ORDER — ALBUTEROL SULFATE (2.5 MG/3ML) 0.083% IN NEBU: 2.5000 mg | INHALATION_SOLUTION | Freq: Four times a day (QID) | RESPIRATORY_TRACT | Status: DC | PRN

## 2022-11-16 MED ORDER — ACETAMINOPHEN 325 MG PO TABS
325.0000 mg | ORAL_TABLET | ORAL | Status: DC | PRN
  Administered 2022-11-16 – 2022-11-17 (×2): 650 mg via ORAL
  Filled 2022-11-16 (×3): qty 2

## 2022-11-16 MED ORDER — FLUCONAZOLE 200 MG PO TABS: 400.0000 mg | ORAL_TABLET | Freq: Every day | ORAL | 0 refills | Status: DC

## 2022-11-16 MED ORDER — LOSARTAN POTASSIUM 25 MG PO TABS
25.0000 mg | ORAL_TABLET | Freq: Every day | ORAL | Status: DC
  Administered 2022-11-17: 50 mg via ORAL
  Administered 2022-11-18 – 2022-11-21 (×4): 25 mg via ORAL
  Administered 2022-11-22 – 2022-11-24 (×3): 50 mg via ORAL
  Filled 2022-11-16 (×2): qty 2
  Filled 2022-11-16: qty 1
  Filled 2022-11-16: qty 2
  Filled 2022-11-16 (×3): qty 1
  Filled 2022-11-16: qty 2
  Filled 2022-11-16: qty 1

## 2022-11-16 MED ORDER — FLUCONAZOLE 100 MG PO TABS
400.0000 mg | ORAL_TABLET | Freq: Every day | ORAL | Status: DC
  Administered 2022-11-17 – 2022-11-24 (×8): 400 mg via ORAL
  Filled 2022-11-16 (×8): qty 4

## 2022-11-16 MED ORDER — MECLIZINE HCL 25 MG PO TABS: 25.0000 mg | ORAL_TABLET | Freq: Three times a day (TID) | ORAL | Status: DC | PRN

## 2022-11-16 MED ORDER — OXYCODONE HCL 5 MG PO TABS
5.0000 mg | ORAL_TABLET | ORAL | Status: DC | PRN
  Administered 2022-11-17 – 2022-11-24 (×9): 5 mg via ORAL
  Filled 2022-11-16 (×9): qty 1

## 2022-11-16 MED ORDER — CHLORHEXIDINE GLUCONATE CLOTH 2 % EX PADS: 6.0000 | MEDICATED_PAD | Freq: Every day | CUTANEOUS | Status: DC

## 2022-11-16 MED ORDER — FAMOTIDINE 20 MG PO TABS
20.0000 mg | ORAL_TABLET | Freq: Two times a day (BID) | ORAL | Status: DC
  Administered 2022-11-16 – 2022-11-24 (×16): 20 mg via ORAL
  Filled 2022-11-16 (×16): qty 1

## 2022-11-16 MED ORDER — SODIUM CHLORIDE 0.9% FLUSH: 10.0000 mL | INTRAVENOUS | Status: DC | PRN

## 2022-11-16 MED ORDER — ENSURE ENLIVE PO LIQD
237.0000 mL | Freq: Two times a day (BID) | ORAL | Status: DC
  Administered 2022-11-17 – 2022-11-23 (×13): 237 mL via ORAL

## 2022-11-16 MED ORDER — GABAPENTIN 400 MG PO CAPS
400.0000 mg | ORAL_CAPSULE | Freq: Three times a day (TID) | ORAL | Status: DC
  Administered 2022-11-16 – 2022-11-24 (×24): 400 mg via ORAL
  Filled 2022-11-16: qty 4
  Filled 2022-11-16 (×23): qty 1

## 2022-11-16 MED ORDER — OXYCODONE-ACETAMINOPHEN 5-325 MG PO TABS: 1.0000 | ORAL_TABLET | ORAL | 0 refills | Status: DC | PRN

## 2022-11-16 MED ORDER — NYSTATIN 100000 UNIT/GM EX POWD
Freq: Two times a day (BID) | CUTANEOUS | Status: DC
  Filled 2022-11-16: qty 15

## 2022-11-16 MED ORDER — AMOXICILLIN-POT CLAVULANATE 875-125 MG PO TABS
1.0000 | ORAL_TABLET | Freq: Two times a day (BID) | ORAL | Status: DC
  Administered 2022-11-16 – 2022-11-24 (×16): 1 via ORAL
  Filled 2022-11-16 (×16): qty 1

## 2022-11-16 MED ORDER — CALCIUM POLYCARBOPHIL 625 MG PO TABS
625.0000 mg | ORAL_TABLET | Freq: Two times a day (BID) | ORAL | Status: DC
  Administered 2022-11-16 – 2022-11-24 (×16): 625 mg via ORAL
  Filled 2022-11-16 (×16): qty 1

## 2022-11-16 MED ORDER — AMOXICILLIN-POT CLAVULANATE 875-125 MG PO TABS: 1.0000 | ORAL_TABLET | Freq: Two times a day (BID) | ORAL | 0 refills | Status: DC

## 2022-11-16 MED ORDER — METHOCARBAMOL 750 MG PO TABS: 750.0000 mg | ORAL_TABLET | Freq: Four times a day (QID) | ORAL | 0 refills | Status: DC | PRN

## 2022-11-16 NOTE — Progress Notes (Signed)
Report called to CIR [employer]. Pt left via [facility] in stable condition with all belongings.

## 2022-11-16 NOTE — Progress Notes (Signed)
12 Days Post-Op   Subjective/Chief Complaint: Tolerating food.  No major changes.  Objective: Vital signs in last 24 hours: Temp:  [97.7 F (36.5 C)-99.1 F (37.3 C)] 97.7 F (36.5 C) (07/04 0536) Pulse Rate:  [80-85] 84 (07/04 0536) Resp:  [17-18] 18 (07/04 0536) BP: (129-131)/(70-77) 131/70 (07/04 0536) SpO2:  [94 %-100 %] 94 % (07/04 0536) Last BM Date : 11/14/22  Intake/Output from previous day: 07/03 0701 - 07/04 0700 In: 2630 [P.O.:2400; I.V.:230] Out: 2903 [Urine:2850; Drains:53] Intake/Output this shift: No intake/output data recorded.  Exam: Abdomen soft, Wound vac holding suction with serosanguinous drainage  Lab Results:  Recent Labs    11/15/22 0339 11/16/22 0235  WBC 11.9* 11.3*  HGB 8.1* 8.2*  HCT 27.0* 27.8*  PLT 414* 386    BMET Recent Labs    11/15/22 0339 11/16/22 0235  NA 136 136  K 3.9 3.6  CL 100 98  CO2 25 27  GLUCOSE 111* 110*  BUN 17 15  CREATININE 0.50 0.52  CALCIUM 8.4* 8.2*    PT/INR No results for input(s): "LABPROT", "INR" in the last 72 hours. ABG No results for input(s): "PHART", "HCO3" in the last 72 hours.  Invalid input(s): "PCO2", "PO2"  Studies/Results: CT ABDOMEN PELVIS W CONTRAST  Result Date: 11/14/2022 CLINICAL DATA:  Small bowel perforation and intra-abdominal abscess. EXAM: CT ABDOMEN AND PELVIS WITH CONTRAST TECHNIQUE: Multidetector CT imaging of the abdomen and pelvis was performed using the standard protocol following bolus administration of intravenous contrast. RADIATION DOSE REDUCTION: This exam was performed according to the departmental dose-optimization program which includes automated exposure control, adjustment of the mA and/or kV according to patient size and/or use of iterative reconstruction technique. CONTRAST:  OMNIPAQUE IOHEXOL 300 MG/ML  SOLN COMPARISON:  CT abdomen pelvis dated 07/26/2022. FINDINGS: Lower chest: The visualized lung bases are clear. No intra-abdominal free air.  Hepatobiliary: Fatty infiltration of the liver. No biliary dilatation. The gallbladder is unremarkable. Pancreas: Unremarkable. No pancreatic ductal dilatation or surrounding inflammatory changes. Spleen: Normal in size without focal abnormality. Adrenals/Urinary Tract: The adrenal glands unremarkable. There is no hydronephrosis on either side. There is symmetric enhancement and excretion of contrast by both kidneys. The visualized ureters and urinary bladder appear unremarkable small focus of air within the bladder, likely introduced via recent catheterization. Stomach/Bowel: There is sigmoid diverticulosis without active inflammatory changes. There is no bowel obstruction or active inflammation. No CT evidence of acute appendicitis. Vascular/Lymphatic: Mild atherosclerotic calcification of the abdominal aorta. The IVC is unremarkable. No portal venous gas. There is no adenopathy. Reproductive: Hysterectomy.  No adnexal masses. Other: There is diffuse mesenteric edema as well as omental nodularity. There is a 5.8 x 4.6 cm loculated appearing fluid in the left anterior lower abdomen concerning for an infected fluid or developing abscess. This collection may communicate with the anterior pelvic wall open wound. Additional smaller loculated fluid within the mesentery in the anterior lower abdomen, one abutting the small bowel and the other abutting the medial wall of the ascending colon. Two drainage catheters with tips terminating in the left hemipelvis. No fluid collection noted along the course or adjacent to the tips of the drainage catheters. Musculoskeletal: Anterior pelvic wall open wound and wound VAC. No acute osseous pathology. IMPRESSION: 1. Loculated appearing fluid in the left anterior lower abdomen concerning for an infected fluid or developing abscess. This collection may communicate with the anterior pelvic wall open wound. Smaller loculated collections in the lower abdomen. 2. Two drainage catheters  with tips terminating in the left hemipelvis. No fluid collection noted along the course or adjacent to the tips of the drainage catheters. 3. Sigmoid diverticulosis. No bowel obstruction. 4. Fatty liver. 5.  Aortic Atherosclerosis (ICD10-I70.0). Electronically Signed   By: Elgie Collard M.D.   On: 11/14/2022 18:34    Anti-infectives: Anti-infectives (From admission, onward)    Start     Dose/Rate Route Frequency Ordered Stop   11/16/22 1000  fluconazole (DIFLUCAN) tablet 400 mg        400 mg Oral Daily 11/15/22 1013     11/15/22 1200  amoxicillin-clavulanate (AUGMENTIN) 875-125 MG per tablet 1 tablet        1 tablet Oral Every 12 hours 11/15/22 1013     11/14/22 0830  Ampicillin-Sulbactam (UNASYN) 3 g in sodium chloride 0.9 % 100 mL IVPB  Status:  Discontinued        3 g 200 mL/hr over 30 Minutes Intravenous Every 6 hours 11/14/22 0740 11/15/22 1013   11/05/22 1215  vancomycin (VANCOREADY) IVPB 1750 mg/350 mL  Status:  Discontinued        1,750 mg 175 mL/hr over 120 Minutes Intravenous Every 24 hours 11/04/22 1125 11/08/22 1023   11/04/22 1215  vancomycin (VANCOREADY) IVPB 2000 mg/400 mL        2,000 mg 200 mL/hr over 120 Minutes Intravenous STAT 11/04/22 1125 11/04/22 1402   11/04/22 1000  fluconazole (DIFLUCAN) IVPB 400 mg  Status:  Discontinued        400 mg 100 mL/hr over 120 Minutes Intravenous Every 24 hours 11/03/22 0801 11/15/22 1013   11/03/22 0845  fluconazole (DIFLUCAN) IVPB 800 mg        800 mg 100 mL/hr over 240 Minutes Intravenous  Once 11/03/22 0759 11/03/22 1327   10/28/22 1000  azithromycin (ZITHROMAX) tablet 250 mg  Status:  Discontinued       See Hyperspace for full Linked Orders Report.   250 mg Oral Daily 10/27/22 0738 10/27/22 1653   10/27/22 1700  piperacillin-tazobactam (ZOSYN) IVPB 3.375 g  Status:  Discontinued        3.375 g 12.5 mL/hr over 240 Minutes Intravenous Every 8 hours 10/27/22 1653 11/14/22 0740   10/27/22 1000  azithromycin (ZITHROMAX) tablet  500 mg       See Hyperspace for full Linked Orders Report.   500 mg Oral Daily 10/27/22 0738 10/27/22 0946   10/23/22 1054  ceFAZolin (ANCEF) 2-4 GM/100ML-% IVPB       Note to Pharmacy: Augustine Radar R: cabinet override      10/23/22 1054 10/23/22 2259   10/23/22 0600  ceFAZolin (ANCEF) IVPB 3g/100 mL premix        3 g 200 mL/hr over 30 Minutes Intravenous On call to O.R. 10/23/22 0540 10/23/22 1213       Assessment/Plan: Ms. Riggan underwent difficult robotic recurrent incisional hernia repair with mesh, bilateral posterior rectus myofascial release, bilateral transversus abdominis release and removal of previous polypropylene mesh with prolonged lyses of adhesions on 10/23/22. This was complicated by bowel perforation, possible missed enterotomy during dissection of small intestine off mesh, requiring return to the operating room with washout, small bowel resection and ABTHERA placement on 10/29/22.  Returned to the OR for wash out 6/18 and 6/20.  Underwent bridging retromuscular Phasix mesh closure of the abdomen 11/04/22.  ID consult for polymicrobial infection - continue antibiotics - CT with appropriate appearing fluid collections for this time post op.  I would not aspirate or  place drains in these as they lie beneath the mesh and in the mesentery and do not appear infected.  They will likely resolve and/or work their way to the wound vac.  Remove JP drains  Antibiotic plan per ID - one month of Augmentin and fluconazole with repeat CT scan to make sure that all of these fluid collections are resolving prior to discontinuing antibiotics Vac changs by WOC team - appreciate assistance and care PT/OT Off TPN and tolerating diet Remove JP drains  Critical care signed off, appreciate assistance ID following  Dispo - Medically stable for discharge to CIR  [name], MD  11/16/2022

## 2022-11-16 NOTE — Progress Notes (Signed)
Physical Therapy Treatment Patient Details Name: Kaitlyn Bray MRN: [id number] DOB: September 11, 1965 Today's Date: 11/16/2022   History of Present Illness Patient is a 57 year old female who underwent ventral hernia s/p robotic incisional repair on 6/10 complicated by bowel perforation, sepsis, and peritonitis s/p ex lap on 6/16 and wash out on 6/18, 6/20 with closing of the abdomen on 6/22. Patient was extubated on 6/26. PMH: obesity, diabetes, HTN    PT Comments   Pt admitted with above diagnosis.  Pt currently with functional limitations due to the deficits listed below (see PT Problem List). Pt demonstrated good progress with PT intervention today, pt is to transition to CIR today. Pt is motivated to return home. B JP drains removed just prior to PT intervention. Pt required min A for supine to sit with cues for abdominal precautions, pt required min A x 2 for pull to stand from EOB to RW.  Min guard for static standing balance at RW and pt able to demonstrate ability to ambulate 18 feet in personal room with RW and recliner close. Pt left seated in recliner all needs in place. Pt will benefit from acute skilled PT to increase their independence and safety with mobility to allow discharge.       Assistance Recommended at Discharge Frequent or constant Supervision/Assistance  If plan is discharge home, recommend the following:  Can travel by private vehicle    Two people to help with walking and/or transfers;A lot of help with bathing/dressing/bathroom;Assistance with cooking/housework;Assist for transportation;Help with stairs or ramp for entrance      Equipment Recommendations  None recommended by PT    Recommendations for Other Services Rehab consult     Precautions / Restrictions Precautions Precautions: Fall;Other (comment) Precaution Comments: 2 jp drains; abdominal wound vac; abdominal precautions Restrictions Weight Bearing Restrictions: No     Mobility  Bed Mobility Overal  bed mobility: Needs Assistance Bed Mobility: Supine to Sit Rolling: Min assist Sidelying to sit: Min assist, HOB elevated (increased time)       General bed mobility comments: cues for log roll technique to maintain abdominal precuations    Transfers Overall transfer level: Needs assistance Equipment used: Rolling walker (2 wheels) Transfers: Sit to/from Stand Sit to Stand: +2 safety/equipment, From elevated surface, Min assist           General transfer comment: pull to stand to RW with min A x 2 from elevated EOB, min cues for posture once in standing    Ambulation/Gait Ambulation/Gait assistance: Min guard, +2 safety/equipment Gait Distance (Feet): 18 Feet Assistive device: Rolling walker (2 wheels) Gait Pattern/deviations: Step-to pattern, Trunk flexed Gait velocity: decreased     General Gait Details: wide BOS, lateral sway noted, cues for safety with RW management and proper distance from RW   Stairs             Wheelchair Mobility     Tilt Bed    Modified Rankin (Stroke Patients Only)       Balance Overall balance assessment: Needs assistance Sitting-balance support: Feet supported Sitting balance-Leahy Scale: Good     Standing balance support: Bilateral upper extremity supported, Reliant on assistive device for balance, During functional activity Standing balance-Leahy Scale: Poor Standing balance comment: reliant on UE support.                            Cognition Arousal/Alertness: Awake/alert Behavior During Therapy: WFL for tasks assessed/performed, Anxious Overall  Cognitive Status: Within Functional Limits for tasks assessed                                          Exercises      General Comments        Pertinent Vitals/Pain Pain Assessment Pain Assessment: 0-10 Pain Score: 3  Negative Vocalization: none Facial Expression: smiling or inexpressive Body Language: relaxed Consolability: no need  to console Pain Location: back and increased pain with B JP drain removal prior to PT session Pain Descriptors / Indicators: Aching, Discomfort, Guarding    Home Living                          Prior Function            PT Goals (current goals can now be found in the care plan section) Acute Rehab PT Goals Patient Stated Goal: go home PT Goal Formulation: With patient Time For Goal Achievement: 11/24/22 Potential to Achieve Goals: Good Progress towards PT goals: Progressing toward goals    Frequency    Min 1X/week      PT Plan Current plan remains appropriate    Co-evaluation              AM-PAC PT "6 Clicks" Mobility   Outcome Measure  Help needed turning from your back to your side while in a flat bed without using bedrails?: A Little Help needed moving from lying on your back to sitting on the side of a flat bed without using bedrails?: A Little Help needed moving to and from a bed to a chair (including a wheelchair)?: A Lot Help needed standing up from a chair using your arms (e.g., wheelchair or bedside chair)?: A Lot Help needed to walk in hospital room?: A Little Help needed climbing 3-5 steps with a railing? : Total 6 Click Score: 14    End of Session Equipment Utilized During Treatment: Gait belt Activity Tolerance: Patient tolerated treatment well Patient left: in chair;with call bell/phone within reach;with chair alarm set Nurse Communication: Mobility status PT Visit Diagnosis: Unsteadiness on feet (R26.81)     Time: 1610-9604 PT Time Calculation (min) (ACUTE ONLY): 21 min  Charges:    $Gait Training: 8-22 mins PT General Charges $$ ACUTE PT VISIT: 1 Visit                     Johnny Bridge, PT Acute Rehab    Jacqualyn Posey 11/16/2022, 11:19 AM

## 2022-11-16 NOTE — TOC Transition Note (Signed)
Transition of Care [facility]) - CM/SW Discharge Note   Patient Details  Name: Kaitlyn Bray MRN: [id number] Date of Birth: 12-Oct-1965  [facility] CM/SW Contact:  Amada Jupiter, LCSW Phone Number: 11/16/2022, 12:37 PM   Clinical Narrative:     Insurance has approved pt for admission to [facility] CIR today.  Transportation already arranged.  No further TOC needs.  Final next level of care: IP Rehab Facility Barriers to Discharge: Barriers Resolved   Patient Goals and CMS Choice CMS Medicare.gov Compare Post Acute Care list provided to:: Patient    Discharge Placement                         Discharge Plan and Services Additional resources added to the After Visit Summary for   In-house Referral: NA Discharge Planning Services: CM Consult Post Acute Care Choice: IP Rehab          DME Arranged: N/A DME Agency: NA       HH Arranged: NA HH Agency: NA        Social Determinants of Health (SDOH) Interventions SDOH Screenings   Food Insecurity: No Food Insecurity (10/23/2022)  Housing: Low Risk  (10/23/2022)  Transportation Needs: No Transportation Needs (10/23/2022)  Utilities: Not At Risk (10/23/2022)  Depression (PHQ2-9): Low Risk  (10/10/2022)  Tobacco Use: Low Risk  (11/14/2022)     Readmission Risk Interventions    11/10/2022    6:05 PM  Readmission Risk Prevention Plan  Transportation Screening Complete  PCP or Specialist Appt within 5-7 Days Complete  Home Care Screening Complete  Medication Review (RN CM) Complete

## 2022-11-16 NOTE — Progress Notes (Signed)
Inpatient Rehabilitation Admission Medication Review by a Pharmacist  A complete drug regimen review was completed for this patient to identify any potential clinically significant medication issues.  High Risk Drug Classes Is patient taking? Indication by Medication  Antipsychotic No   Anticoagulant Yes Lovenox - VTE ppx  Antibiotic Yes Po Augmentin and Fluconazole - planning at least a month for abdominal wound infection   Opioid Yes Oxycodone - prn pain  Antiplatelet No   Hypoglycemics/insulin No   Vasoactive Medication Yes Losartan, hydrochlorothiazide - BP   Chemotherapy No   Other Yes Famotidine - Reflux Gabapentin - pain Albuterol - prn SOB     Type of Medication Issue Identified Description of Issue Recommendation(s)  Drug Interaction(s) (clinically significant)     Duplicate Therapy     Allergy     No Medication Administration End Date     Incorrect Dose     Additional Drug Therapy Needed     Significant med changes from prior encounter (inform family/care partners about these prior to discharge). Metformin - resume if and when appropriate during rehab stay, CBGs currently ok   Other       Clinically significant medication issues were identified that warrant physician communication and completion of prescribed/recommended actions by midnight of the next day:  No  Name of provider notified for urgent issues identified:   Provider Method of Notification:     Pharmacist comments: None  Time spent performing this drug regimen review (minutes):  20 minutes  Thank you Okey Regal, PharmD

## 2022-11-16 NOTE — Progress Notes (Signed)
Pt refused CHG bath, states that it makes her itchy. Pt admits that it may not be CHG wipes that are causing her to itch but continues to refuse. Educated patient on importance of CHG and infection prevention.

## 2022-11-16 NOTE — Discharge Summary (Signed)
Patient ID: Kaitlyn Bray [id number] 57 y.o. May 11, 1966  10/23/2022  Discharge date and time: 11/16/2022  Admitting Physician: Kaitlyn Bray  Discharge Physician: Kaitlyn Bray  Admission Diagnoses: Recurrent ventral hernia [K43.2] Peritonitis [facility]) [K65.9] Patient Active Problem List   Diagnosis Date Noted   Protein-calorie malnutrition, moderate (HCC) 11/12/2022   Physical deconditioning 11/10/2022   Sepsis due to Candida species (HCC) 11/09/2022   Small bowel perforation (HCC) 11/02/2022   Peritonitis (HCC) 10/29/2022   New onset type 2 diabetes mellitus (HCC) 09/15/2022   Aortic atherosclerosis (HCC) 01/17/2022   Morbid obesity (HCC) 01/17/2022   Other fatigue 01/17/2022   Hypocalcemia 01/17/2022   Polyarthralgia 01/17/2022   Muscle spasm 01/17/2022   Vitamin D deficiency 01/17/2022   Recurrent ventral hernia 09/15/2021   Urinary incontinence 06/23/2019   Constipation 06/23/2019   BPPV (benign paroxysmal positional vertigo) 03/20/2019   HTN (hypertension) 04/02/2017   Asthma 04/02/2017     Discharge Diagnoses:  Patient Active Problem List   Diagnosis Date Noted   Protein-calorie malnutrition, moderate (HCC) 11/12/2022   Physical deconditioning 11/10/2022   Sepsis due to Candida species (HCC) 11/09/2022   Small bowel perforation (HCC) 11/02/2022   Peritonitis (HCC) 10/29/2022   New onset type 2 diabetes mellitus (HCC) 09/15/2022   Aortic atherosclerosis (HCC) 01/17/2022   Morbid obesity (HCC) 01/17/2022   Other fatigue 01/17/2022   Hypocalcemia 01/17/2022   Polyarthralgia 01/17/2022   Muscle spasm 01/17/2022   Vitamin D deficiency 01/17/2022   Recurrent ventral hernia 09/15/2021   Urinary incontinence 06/23/2019   Constipation 06/23/2019   BPPV (benign paroxysmal positional vertigo) 03/20/2019   HTN (hypertension) 04/02/2017   Asthma 04/02/2017    Operations: Procedure(s): ABSORBABLE MESH CLOSURE OF ABDOMEN APPLICATION OF WOUND  VAC  Admission Condition: good  Discharged Condition: fair  Indication for Admission: Recurrent incisional hernia  Hospital Course:  Kaitlyn Bray underwent difficult robotic recurrent incisional hernia repair with mesh, bilateral posterior rectus myofascial release, bilateral transversus abdominis release and removal of previous polypropylene mesh with prolonged lyses of adhesions on 10/23/22. This was complicated by bowel perforation, possible missed enterotomy during dissection of small intestine off mesh, requiring return to the operating room with washout, small bowel resection and ABTHERA placement on 10/29/22.  Returned to the OR for wash out 6/18 and 6/20.  Underwent bridging retromuscular Phasix mesh closure of the abdomen 11/04/22.  She now has a wound vac over the mesh to allow the abdominal wall to granulate, as the defect was unable to be closed.  JP drains were removed on the day of discharge from the hospital.  Infectious disease was consulted and recommend one month of Augmentin and fluconazole with repeat CT scan to make sure that all fluid collections are resolving prior to discontinuing antibiotics.  Critical care assisted with her care while intubated and in critical condition.  The wound/ostomy team assisted with vac changes. Plan is for discharge to inpatient rehab to continue her recovery for severe deconditioning after critical illness.  Consults:  Critical care medicine, infectious disease  Significant Diagnostic Studies: as above  Treatments: surgery: as above  Disposition:  inpatient rehab  Patient Instructions:  Allergies as of 11/16/2022       Reactions   Codeine Other (See Comments)   Fast heart rate   Latex Other (See Comments)   Redness from bandaids   Sulfa Drugs Cross Reactors Rash        Medication List     TAKE these medications  albuterol 108 (90 Base) MCG/ACT inhaler Commonly known as: VENTOLIN HFA Inhale 2 puffs into the lungs every 6 (six)  hours as needed for wheezing or shortness of breath.   amoxicillin-clavulanate 875-125 MG tablet Commonly known as: AUGMENTIN Take 1 tablet by mouth every 12 (twelve) hours.   B-12 PO Take 1 tablet by mouth daily.   fluconazole 200 MG tablet Commonly known as: DIFLUCAN Take 2 tablets (400 mg total) by mouth daily.   glucose blood test strip Use as instructed   ibuprofen 200 MG tablet Commonly known as: ADVIL Take 200 mg by mouth every 8 (eight) hours as needed for headache or moderate pain.   losartan-hydrochlorothiazide 50-12.5 MG tablet Commonly known as: HYZAAR TAKE 1 TABLET BY MOUTH EVERY DAY   meclizine 25 MG tablet Commonly known as: ANTIVERT Take 25 mg by mouth 3 (three) times daily as needed for dizziness.   metFORMIN 500 MG 24 hr tablet Commonly known as: GLUCOPHAGE-XR Take 1 tablet (500 mg total) by mouth 2 (two) times daily. Take two tablets twice daily What changed:  how much to take when to take this additional instructions   methocarbamol 750 MG tablet Commonly known as: Robaxin-750 Take 1 tablet (750 mg total) by mouth every 6 (six) hours as needed for muscle spasms.   oxyCODONE-acetaminophen 5-325 MG tablet Commonly known as: Percocet Take 1 tablet by mouth every 4 (four) hours as needed for severe pain.   VITAMIN C PO Take 1 capsule by mouth daily.   VITAMIN D PO Take 1 capsule by mouth daily.   ZINC PO Take 1 tablet by mouth daily.        Activity: no heavy lifting for 6 weeks Diet: regular diet Wound Care:  Wound vac  Follow-up:  With Dr. Dossie Der  Signed: Hyman Hopes Kaitlyn Bray General, Bariatric, & Minimally Invasive Surgery [facility], [place]   11/16/2022, 7:49 AM

## 2022-11-16 NOTE — Discharge Instructions (Signed)
 VENTRAL HERNIA REPAIR POST OPERATIVE INSTRUCTIONS  Thinking Clearly  The anesthesia may cause you to feel different for 1 or 2 days. Do not drive, drink alcohol, or make any big decisions for at least 2 days.  Nutrition When you wake up, you will be able to drink small amounts of liquid. If you do not feel sick, you can slowly advance your diet to regular foods. Continue to drink lots of fluids, usually about 8 to 10 glasses per day. Eat a high-fiber diet so you don't strain during bowel movements. High-Fiber Foods Foods high in fiber include beans, bran cereals and whole-grain breads, peas, dried fruit (figs, apricots, and dates), raspberries, blackberries, strawberries, sweet corn, broccoli, baked potatoes with skin, plums, pears, apples, greens, and nuts. Activity Slowly increase your activity. Be sure to get up and walk every hour or so to prevent blood clots. No heavy lifting or strenuous activity for 4 weeks following surgery to prevent hernias at your incision sites or recurrence of your hernia. It is normal to feel tired. You may need more sleep than usual.  Get your rest but make sure to get up and move around frequently to prevent blood clots and pneumonia.  Work and Return to School You can go back to work when you feel well enough. Discuss the timing with your surgeon. You can usually go back to school or work 1 week or less after an laparoscopic or an open repair. If your work requires heavy lifting or strenuous activity you need to be placed on light duty for 4 weeks following surgery. You can return to gym class, sports or other physical activities 4 weeks after surgery.  Wound Care You may experience significant bruising throughout the abdominal wall that may track down into the groin including into the scrotum in males.  Rest, elevating the groin and scrotum above the level of the heart, ice and compression with tight fitting underwear or an abdominal binder can help.   Always wash your hands before and after touching near your incision site. Do not soak in a bathtub until cleared at your follow up appointment. You may take a shower 24 hours after surgery. A small amount of drainage from the incision is normal. If the drainage is thick and yellow or the site is red, you may have an infection, so call your surgeon. If you have a drain in one of your incisions, it will be taken out in office when the drainage stops. Steri-Strips will fall off in 7 to 10 days or they will be removed during your first office visit. If you have dermabond glue covering over the incision, allow the glue to flake off on its own. Protect the new skin, especially from the sun. The sun can burn and cause darker scarring. Your scar will heal in about 4 to 6 weeks and will become softer and continue to fade over the next year.  The cosmetic appearance of the incisions will improve over the course of the first year after surgery. Sensation around your incision will return in a few weeks or months.  Bowel Movements After intestinal surgery, you may have loose watery stools for several days. If watery diarrhea lasts longer than 3 days, contact your surgeon. Pain medication (narcotics) can cause constipation. Increase the fiber in your diet with high-fiber foods if you are constipated. You can take an over the counter stool softener like Colace to avoid constipation.  Additional over the counter medications can also be used   if Colace isn't sufficient (for example, Milk of Magnesia or Miralax).  Pain The amount of pain is different for each person. Some people need only 1 to 3 doses of pain control medication, while others need more. Take alternating doses of tylenol and ibuprofen around the clock for the first five days following surgery.  This will provide a baseline of pain control and help with inflammation.  Take the narcotic pain medication in addition if needed for severe pain.  Contact  Your Surgeon at [phone], if you have: Pain that will not go away Pain that gets worse A fever of more than 101F (38.3C) Repeated vomiting Swelling, redness, bleeding, or bad-smelling drainage from your wound site Strong abdominal pain No bowel movement or unable to pass gas for 3 days Watery diarrhea lasting longer than 3 days  Pain Control The goal of pain control is to minimize pain, keep you moving and help you heal. Your surgical team will work with you on your pain plan. Most often a combination of therapies and medications are used to control your pain. You may also be given medication (local anesthetic) at the surgical site. This may help control your pain for several days. Extreme pain puts extra stress on your body at a time when your body needs to focus on healing. Do not wait until your pain has reached a level "10" or is unbearable before telling your doctor or nurse. It is much easier to control pain before it becomes severe. Following a laparoscopic procedure, pain is sometimes felt in the shoulder. This is due to the gas inserted into your abdomen during the procedure. Moving and walking helps to decrease the gas and the right shoulder pain.  Use the guide below for ways to manage your post-operative pain. Learn more by going to facs.org/safepaincontrol.  How Intense Is My Pain Common Therapies to Feel Better       I hardly notice my pain, and it does not interfere with my activities.  I notice my pain and it distracts me, but I can still do activities (sitting up, walking, standing).  Non-Medication Therapies  Ice (in a bag, applied over clothing at the surgical site), elevation, rest, meditation, massage, distraction (music, TV, play) walking and mild exercise Splinting the abdomen with pillows +  Non-Opioid Medications Acetaminophen (Tylenol) Non-steroidal anti-inflammatory drugs (NSAIDS) Aspirin, Ibuprofen (Motrin, Advil) Naproxen (Aleve) Take these as  needed, when you feel pain. Both acetaminophen and NSAIDs help to decrease pain and swelling (inflammation).      My pain is hard to ignore and is more noticeable even when I rest.  My pain interferes with my usual activities.  Non-Medication Therapies  +  Non-Opioid medications  Take on a regular schedule (around-the-clock) instead of as needed. (For example, Tylenol every 6 hours at 9:00 am, 3:00 pm, 9:00 pm, 3:00 am and Motrin every 6 hours at 12:00 am, 6:00 am, 12:00 pm, 6:00 pm)         I am focused on my pain, and I am not doing my daily activities.  I am groaning in pain, and I cannot sleep. I am unable to do anything.  My pain is as bad as it could be, and nothing else matters.  Non-Medication Therapies  +  Around-the-Clock Non-Opioid Medications  +  Short-acting opioids  Opioids should be used with other medications to manage severe pain. Opioids block pain and give a feeling of euphoria (feel high). Addiction, a serious side effect of opioids, is   rare with short-term (a few days) use.  Examples of short-acting opioids include: Tramadol (Ultram), Hydrocodone (Norco, Vicodin), Hydromorphone (Dilaudid), Oxycodone (Oxycontin)     The above directions have been adapted from the [employer] Surgical Patient Education Program.  Please refer to the ACS website if needed: [email or web address]://[email or web address]   Mrytle Bento, MD [facility] [address], [address], [place], [place]  [address] ?  [address] [address], [place], [place]   [address] [phone] ? [phone] ? FAX [phone] Web site: [email or web address]  

## 2022-11-16 NOTE — Progress Notes (Addendum)
Inpatient Rehab Admissions Coordinator:    I have insurance approval and a bed available for pt to admit to CIR today. Dr. Dossie Der in agreement.  Will let pt/family and TOC team know.   1036: Pickup scheduled for 2pm with [facility].  Pt to go to room 4w02.    Estill Dooms, PT, DPT Admissions Coordinator [phone] 11/16/22  8:56 AM

## 2022-11-16 NOTE — Progress Notes (Signed)
Pt arrived to [facility] from [facility]. Pt is A&Ox4, JP drains removed before admission, wound vac intact, incisions intact. Pt oriented to unit and expressed understanding. All needs met a this time. Call light in reach.

## 2022-11-17 DIAGNOSIS — R5381 Other malaise: Secondary | ICD-10-CM

## 2022-11-17 MED ORDER — POTASSIUM CHLORIDE CRYS ER 20 MEQ PO TBCR
20.0000 meq | EXTENDED_RELEASE_TABLET | Freq: Two times a day (BID) | ORAL | Status: AC
  Administered 2022-11-17 – 2022-11-19 (×6): 20 meq via ORAL
  Filled 2022-11-17 (×5): qty 1
  Filled 2022-11-17: qty 2

## 2022-11-17 MED ORDER — CHLORHEXIDINE GLUCONATE CLOTH 2 % EX PADS: 6.0000 | MEDICATED_PAD | Freq: Two times a day (BID) | CUTANEOUS | Status: DC

## 2022-11-17 NOTE — Plan of Care (Signed)
  Problem: RH Balance Goal: LTG Patient will maintain dynamic standing with ADLs (OT) Description: LTG:  Patient will maintain dynamic standing balance with assist during activities of daily living (OT)  Flowsheets (Taken 11/17/2022 1617) LTG: Pt will maintain dynamic standing balance during ADLs with: Supervision/Verbal cueing   Problem: RH Grooming Goal: LTG Patient will perform grooming w/assist,cues/equip (OT) Description: LTG: Patient will perform grooming with assist, with/without cues using equipment (OT) Flowsheets (Taken 11/17/2022 1617) LTG: Pt will perform grooming with assistance level of: Independent   Problem: RH Bathing Goal: LTG Patient will bathe all body parts with assist levels (OT) Description: LTG: Patient will bathe all body parts with assist levels (OT) Flowsheets (Taken 11/17/2022 1617) LTG: Pt will perform bathing with assistance level/cueing: Independent with assistive device  LTG: Position pt will perform bathing: At sink   Problem: RH Dressing Goal: LTG Patient will perform upper body dressing (OT) Description: LTG Patient will perform upper body dressing with assist, with/without cues (OT). Flowsheets (Taken 11/17/2022 1617) LTG: Pt will perform upper body dressing with assistance level of: Independent Goal: LTG Patient will perform lower body dressing w/assist (OT) Description: LTG: Patient will perform lower body dressing with assist, with/without cues in positioning using equipment (OT) Flowsheets (Taken 11/17/2022 1617) LTG: Pt will perform lower body dressing with assistance level of: Independent with assistive device   Problem: RH Toileting Goal: LTG Patient will perform toileting task (3/3 steps) with assistance level (OT) Description: LTG: Patient will perform toileting task (3/3 steps) with assistance level (OT)  Flowsheets (Taken 11/17/2022 1617) LTG: Pt will perform toileting task (3/3 steps) with assistance level: Independent with assistive device    Problem: RH Simple Meal Prep Goal: LTG Patient will perform simple meal prep w/assist (OT) Description: LTG: Patient will perform simple meal prep with assistance, with/without cues (OT). Flowsheets (Taken 11/17/2022 1617) LTG: Pt will perform simple meal prep with assistance level of: Supervision/Verbal cueing LTG: Pt will perform simple meal prep w/level of: Ambulate with device   Problem: RH Light Housekeeping Goal: LTG Patient will perform light housekeeping w/assist (OT) Description: LTG: Patient will perform light housekeeping with assistance, with/without cues (OT). Flowsheets (Taken 11/17/2022 1617) LTG: Pt will perform light housekeeping with assistance level of: Supervision/Verbal cueing LTG: Pt will perform light housekeeping w/level of: Ambulate with device   Problem: RH Toilet Transfers Goal: LTG Patient will perform toilet transfers w/assist (OT) Description: LTG: Patient will perform toilet transfers with assist, with/without cues using equipment (OT) Flowsheets (Taken 11/17/2022 1617) LTG: Pt will perform toilet transfers with assistance level of: Independent with assistive device   Problem: RH Toilet Transfers Goal: LTG Patient will perform toilet transfers w/assist (OT) Description: LTG: Patient will perform toilet transfers with assist, with/without cues using equipment (OT) Flowsheets (Taken 11/17/2022 1617) LTG: Pt will perform toilet transfers with assistance level of: Independent with assistive device   Problem: RH Tub/Shower Transfers Goal: LTG Patient will perform tub/shower transfers w/assist (OT) Description: LTG: Patient will perform tub/shower transfers with assist, with/without cues using equipment (OT) Flowsheets (Taken 11/17/2022 1617) LTG: Pt will perform tub/shower stall transfers with assistance level of: Supervision/Verbal cueing LTG: Pt will perform tub/shower transfers from: Tub/shower combination

## 2022-11-17 NOTE — Plan of Care (Signed)
Problem: Sit to Stand Goal: LTG:  Patient will perform sit to stand with assistance level (PT) Description: LTG:  Patient will perform sit to stand with assistance level (PT) Flowsheets (Taken 11/17/2022 1150) LTG: PT will perform sit to stand in preparation for functional mobility with assistance level: Independent with assistive device   Problem: RH Bed Mobility Goal: LTG Patient will perform bed mobility with assist (PT) Description: LTG: Patient will perform bed mobility with assistance, with/without cues (PT). Flowsheets (Taken 11/17/2022 1150) LTG: Pt will perform bed mobility with assistance level of: Independent with assistive device    Problem: RH Bed to Chair Transfers Goal: LTG Patient will perform bed/chair transfers w/assist (PT) Description: LTG: Patient will perform bed to chair transfers with assistance (PT). Flowsheets (Taken 11/17/2022 1150) LTG: Pt will perform Bed to Chair Transfers with assistance level: Independent with assistive device    Problem: RH Car Transfers Goal: LTG Patient will perform car transfers with assist (PT) Description: LTG: Patient will perform car transfers with assistance (PT). Flowsheets (Taken 11/17/2022 1150) LTG: Pt will perform car transfers with assist:: Supervision/Verbal cueing   Problem: RH Ambulation Goal: LTG Patient will ambulate in home environment (PT) Description: LTG: Patient will ambulate in home environment, # of feet with assistance (PT). Flowsheets (Taken 11/17/2022 1150) LTG: Pt will ambulate in home environ  assist needed:: Independent with assistive device LTG: Ambulation distance in home environment: 50' Goal: LTG Patient will ambulate in community environment (PT) Description: LTG: Patient will ambulate in community environment, # of feet with assistance (PT). Flowsheets (Taken 11/17/2022 1150) LTG: Pt will ambulate in community environ  assist needed:: Independent with assistive device LTG: Ambulation distance in community  environment: 150'   Problem: RH Stairs Goal: LTG Patient will ambulate up and down stairs w/assist (PT) Description: LTG: Patient will ambulate up and down # of stairs with assistance (PT) Flowsheets (Taken 11/17/2022 1150) LTG: Pt will ambulate up/down stairs assist needed:: Supervision/Verbal cueing LTG: Pt will  ambulate up and down number of stairs: 4

## 2022-11-17 NOTE — Evaluation (Signed)
Occupational Therapy Assessment and Plan  Patient Details  Name: Kaitlyn Bray MRN: [id number] Date of Birth: July 10, 1965  OT Diagnosis: muscle weakness (generalized) Rehab Potential: Rehab Potential (ACUTE ONLY): Good ELOS: 10-14 days   Today's Date: 11/17/2022 OT Individual Time: 4098-1191 OT Individual Time Calculation (min): 70 min     Hospital Problem: Principal Problem:   Debility   Past Medical History:  Past Medical History:  Diagnosis Date   Asthma    Complication of anesthesia    Heart murmur    Hernia    Hypertension    New onset type 2 diabetes mellitus (HCC) 09/15/2022   PONV (postoperative nausea and vomiting)    Pre-diabetes    S/P hernia repair 08/26/2021   SBO (small bowel obstruction) (HCC) 09/15/2021   Past Surgical History:  Past Surgical History:  Procedure Laterality Date   ABDOMINAL HYSTERECTOMY     one ovary stlil in place. removed uterus cervix and pt thinks fallopian tubes   APPENDECTOMY     APPLICATION OF WOUND VAC N/A 11/04/2022   Procedure: APPLICATION OF WOUND VAC;  Surgeon: [name], MD;  Location: [facility];  Service: General;  Laterality: N/A;   CESAREAN SECTION     DEBRIDEMENT AND CLOSURE WOUND N/A 11/04/2022   Procedure: ABSORBABLE MESH CLOSURE OF ABDOMEN;  Surgeon: [name], MD;  Location: [facility];  Service: General;  Laterality: N/A;   INCISIONAL HERNIA REPAIR N/A 08/26/2021   Procedure: INCISIONAL HERNIA REPAIR;  Surgeon: [name], MD;  Location: [facility] OR;  Service: General;  Laterality: N/A;   INSERTION OF MESH N/A 08/26/2021   Procedure: INSERTION OF MESH;  Surgeon: [name], MD;  Location: [facility] OR;  Service: General;  Laterality: N/A;   LAPAROTOMY N/A 07/29/2020   Procedure: EXPLORATORY LAPAROTOMY, REPAIR OF VENTRAL HERNIA;  Surgeon: [name], MD;  Location: [facility] OR;  Service: General;  Laterality: N/A;   LAPAROTOMY N/A 10/29/2022   Procedure: EXPLORATORY LAPAROTOMY WITH REPAIR OF SMALL BOWEL  PERFORATION, AND PLACEMENT OF WOUND VAC;  Surgeon: Harriette Bouillon, MD;  Location: [facility];  Service: General;  Laterality: N/A;   LAPAROTOMY N/A 10/31/2022   Procedure: EXPLORATORY LAPAROTOMY - WASH OUT OF ABDOMEN AND WOUND VAC PLACEMENT;  Surgeon: Abigail Miyamoto, MD;  Location: [facility];  Service: General;  Laterality: N/A;   LAPAROTOMY N/A 11/02/2022   Procedure: EXPLORATORY LAPAROTOMY Wound wash out of abdomen and wound vac change;  Surgeon: Abigail Miyamoto, MD;  Location: [facility];  Service: General;  Laterality: N/A;   MYOMECTOMY     XI ROBOTIC ASSISTED VENTRAL HERNIA N/A 10/23/2022   Procedure: ROBOTIC RECURRENT INCISIONAL HERNIA REPAIR WITH MESH WITH BILATERAL TRANVERSUS ABDOMINUS MYOFACIAL RELEASE AND BILATERAL POSTERIOR RECTUS MYOFACIAL RELEASE;  Surgeon: [name], MD;  Location: [facility];  Service: General;  Laterality: N/A;    Assessment & Plan Clinical Impression: Patient is a 57 y.o. year old female with recent admission to the hospital on  10/23/22 for planned robotic hernia repair by Dr. Dossie Der due to recurrent ventral hernia. Pt had fever and placed on 2L O2 on 6/11. Copious dark brown, foul-smelling fluid burst through abdominal wall and pt sent to OR emergently for exploratory laparotomy on 10/29/22. Pt required resection of small bowel perforation and primary anastomosis and excision of mesh with placement of wound VAC; abdomen left open. Pt remained intubated. Pt returned to OR for washout of abdomen and wound VAC change by Dr. Magnus Ivan on 10/31/22. Pt remained intubated. TPN started on 6/19.  Pt returned to OR for washout of abdomen by Dr. Magnus Ivan on 11/02/22. Pt remained intubated. Infectious Disease consulted for polymicrobial infection (group G streptococci, actinomyecs, bacteroids ovatus beta lactamase positive and candida albicans). Pt started on Fluconazole. Pt returned to OR for closure of abdomen and application of wound VAC by Dr. Dossie Der on 11/04/22. Pt  extubated to 2L nasal cannula on 11/08/22.   Patient transferred to CIR on 11/16/2022 .    Patient currently requires mod with basic self-care skills and IADL secondary to muscle weakness.  Prior to hospitalization, patient could complete IADL's without assistance.  Patient will benefit from skilled intervention to increase independence with basic self-care skills and increase level of independence with iADL prior to discharge home with care partner.  Anticipate patient will require intermittent supervision and follow up home health.  OT - End of Session Activity Tolerance: Tolerates 10 - 20 min activity with multiple rests Endurance Deficit: Yes Endurance Deficit Description: Global deconditioning OT Assessment Rehab Potential (ACUTE ONLY): Good OT Patient demonstrates impairments in the following area(s): Balance;Skin Integrity;Endurance OT Basic ADL's Functional Problem(s): Bathing;Dressing;Toileting OT Advanced ADL's Functional Problem(s): Simple Meal Preparation;Light Housekeeping OT Transfers Functional Problem(s): Toilet;Tub/Shower OT Plan OT Intensity: Minimum of 1-2 x/day, 45 to 90 minutes OT Frequency: 5 out of 7 days OT Duration/Estimated Length of Stay: 10-14 days OT Treatment/Interventions: Balance/vestibular training;Discharge planning;Pain management;Self Care/advanced ADL retraining;Therapeutic Activities;UE/LE Coordination activities;Therapeutic Exercise;Skin care/wound managment;Patient/family education;Functional mobility training;Community reintegration;DME/adaptive equipment instruction;UE/LE Strength taining/ROM OT Self Feeding Anticipated Outcome(s): Independent OT Basic Self-Care Anticipated Outcome(s): Supervision/ Modified independent OT Toileting Anticipated Outcome(s): Supervision/ Modified Independent OT Bathroom Transfers Anticipated Outcome(s): Modified Independent OT Recommendation Recommendations for Other Services: Therapeutic Recreation consult Therapeutic  Recreation Interventions: Warehouse manager;Outing/community reintergration Patient destination: Home Follow Up Recommendations: Home health OT Equipment Recommended: 3 in 1 bedside comode;To be determined Equipment Details: shower DME needs to be assessed once cleared for shower.   OT Evaluation Precautions/Restrictions  Precautions Precautions: Fall Precaution Comments: Abdominal wound vac, abdominal precautions Restrictions Weight Bearing Restrictions: No General Chart Reviewed: Yes Response to Previous Treatment: Patient with no complaints from previous session Family/Caregiver Present: No Vital Signs Therapy Vitals Temp: 99 F (37.2 C) Pulse Rate: 94 Resp: 18 BP: 117/70 Patient Position (if appropriate): Lying Oxygen Therapy SpO2: 98 % O2 Device: Room Air Pain Pain Assessment Pain Scale: 0-10 Pain Score: 4  Pain Type: Surgical pain Pain Location: Abdomen Pain Orientation: Mid;Lower Pain Descriptors / Indicators: Aching Pain Onset: On-going Home Living/Prior Functioning Home Living Living Arrangements: Spouse/significant other, Children Available Help at Discharge: Family, Friend(s), Available 24 hours/day Type of Home: House Home Access: Ramped entrance Home Layout: One level Bathroom Shower/Tub: Engineer, manufacturing systems: Handicapped height Bathroom Accessibility: No Additional Comments: Patient reports a walker will not fit into her bathroom  Lives With: Spouse, Son IADL History Homemaking Responsibilities: Yes Meal Prep Responsibility: Primary Laundry Responsibility: Primary Cleaning Responsibility: Primary Bill Paying/Finance Responsibility: Primary Shopping Responsibility: Primary Homemaking Comments: Husband is retired but both shared household chores. 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 Leisure: Hobbies-yes (Comment) (Crotchet, read books, listening to music) Vision Baseline Vision/History: 1 Wears glasses Ability to See in Adequate Light:  0 Adequate Patient Visual Report: No change from baseline Vision Assessment?: No apparent visual deficits Additional Comments: Patient wears progressive lense glasses Perception  Perception: Within Functional Limits Praxis Praxis: Intact Cognition Cognition Overall Cognitive Status: Within Functional Limits for tasks assessed Arousal/Alertness: Awake/alert Orientation Level: Person;Place;Situation Person: Oriented Place: Oriented Situation: Oriented Memory: Appears intact Awareness: Appears intact Problem Solving: Appears intact Safety/Judgment: Appears intact Brief Interview for Mental Status (BIMS) Repetition of Three Words (First Attempt): 3 Temporal Orientation: Year: Correct Temporal Orientation: Month: Accurate within 5 days Temporal Orientation: Day: Correct Recall: "Sock": Yes, no cue required Recall: "Blue": Yes, no cue required Recall: "Bed": Yes, no cue required BIMS Summary Score: 15 Sensation Sensation Light Touch: Appears Intact Hot/Cold: Appears Intact Proprioception: Appears Intact Stereognosis: Appears Intact Coordination Gross Motor Movements are Fluid and Coordinated: No Fine Motor Movements are Fluid and Coordinated: Yes Motor  Motor Motor: Within Functional Limits  Trunk/Postural Assessment     Balance   Extremity/Trunk Assessment RUE Assessment RUE Assessment: Within Functional Limits General Strength Comments: generalized weakness LUE Assessment LUE Assessment: Within Functional Limits General  Strength Comments: generalized weakness  Care Tool Care Tool Self Care Eating   Eating Assist Level: Set up assist    Oral Care    Oral Care Assist Level: Set up assist    Bathing   Body parts bathed by patient: Right arm;Left arm;Chest;Abdomen;Right upper leg;Left upper leg;Face Body parts bathed by helper: Front perineal area;Buttocks;Left lower leg;Right lower leg   Assist Level: Moderate Assistance - Patient 50 - 74%    Upper Body Dressing(including orthotics)   What is the patient wearing?: Pull over shirt   Assist Level: Contact Guard/Touching assist    Lower Body Dressing (excluding footwear)   What is the patient wearing?: Pants;Underwear/pull up Assist for lower body dressing: Maximal Assistance - Patient 25 - 49%    Putting on/Taking off footwear   What is the patient wearing?: Non-skid slipper socks Assist for footwear: Dependent - Patient 0%       Care Tool Toileting Toileting activity   Assist for toileting: Moderate Assistance - Patient 50 - 74%     Care Tool Bed Mobility Roll left and right activity   Roll left and right assist level: Minimal Assistance - Patient > 75%    Sit to lying activity   Sit to lying assist level: Minimal Assistance - Patient > 75%    Lying to sitting on side of bed activity    Moderate Assistance - Patient 50 - 74%     Care Tool Transfers Sit to stand transfer   Sit to stand assist level: Minimal Assistance - Patient > 75%    Chair/bed transfer   Chair/bed transfer assist level: Minimal Assistance - Patient > 75%     Toilet transfer   Assist Level: Minimal Assistance - Patient > 75%     Care Tool Cognition  Expression of Ideas and Wants  Independent  Understanding Verbal and Non-Verbal Content   Independent  Memory/Recall Ability  Independent   Refer to Care Plan for Long Term Goals  SHORT TERM GOAL WEEK 1 OT Short Term Goal 1 (Week 1): Patient to perform toileting with SBA OT Short Term Goal 2 (Week 1):  Patient to perform LE dressing with CGA and use of AE PRN OT Short Term Goal 3 (Week 1): Patient to perform bathing Min assist with AE PRN OT Short Term Goal 4 (Week 1): Patient to tolerate stading for grooming tasks CGA x 3  minutes.  Recommendations for other services: Adult nurse group, Stress management, and Outing/community reintegration   Skilled Therapeutic Intervention ADL ADL Eating: Independent Where Assessed-Eating: Bed level Grooming: Setup Where Assessed-Grooming: Sitting at sink Upper Body Bathing: Minimal assistance Where Assessed-Upper Body Bathing: Edge of bed Lower Body Bathing: Maximal assistance Where Assessed-Lower Body Bathing: Edge of bed Upper Body Dressing: Contact guard Where Assessed-Upper Body Dressing: Edge of bed Lower Body Dressing: Maximal assistance Where Assessed-Lower Body Dressing: Edge of bed Toileting: Moderate assistance Where Assessed-Toileting: Teacher, adult education: Curator Method: Proofreader: Engineer, technical sales: Unable to Engineer, technical sales: Unable to assess ADL Comments: not able to shower at this time due to wound VAC Mobility  Bed Mobility Supine to Sit: Minimal Assistance - Patient > 75% Sit to Supine: Minimal Assistance - Patient > 75% Transfers Sit to Stand: Minimal Assistance - Patient > 75% Stand to Sit: Minimal Assistance - Patient > 75%   Discharge Criteria: Patient will be discharged from OT if patient refuses treatment 3 consecutive times without medical reason, if treatment goals not met, if there is a change in medical status, if patient makes no progress towards goals or if patient is discharged from hospital.  The above assessment, treatment plan, treatment alternatives and goals were discussed and mutually agreed upon: by patient  Warnell Forester 11/17/2022, 4:12 PM

## 2022-11-17 NOTE — Progress Notes (Signed)
Physical Therapy Assessment and Plan  Patient Details  Name: Kaitlyn Bray MRN: [id number] Date of Birth: Sep 20, 1965  PT Diagnosis: Difficulty walking and Muscle weakness Rehab Potential: Good ELOS: 7-10 days   Today's Date: 11/17/2022 PT Individual Time: 1st Treatment Session: 1045-1200; 2nd Treatment Session: 1330-1430 PT Individual Time Calculation (min): 75 min; 60 min   Hospital Problem: Principal Problem:   Debility   Past Medical History:  Past Medical History:  Diagnosis Date   Asthma    Complication of anesthesia    Heart murmur    Hernia    Hypertension    New onset type 2 diabetes mellitus (HCC) 09/15/2022   PONV (postoperative nausea and vomiting)    Pre-diabetes    S/P hernia repair 08/26/2021   SBO (small bowel obstruction) (HCC) 09/15/2021   Past Surgical History:  Past Surgical History:  Procedure Laterality Date   ABDOMINAL HYSTERECTOMY     one ovary stlil in place. removed uterus cervix and pt thinks fallopian tubes   APPENDECTOMY     APPLICATION OF WOUND VAC N/A 11/04/2022   Procedure: APPLICATION OF WOUND VAC;  Surgeon: [name], MD;  Location: [facility];  Service: General;  Laterality: N/A;   CESAREAN SECTION     DEBRIDEMENT AND CLOSURE WOUND N/A 11/04/2022   Procedure: ABSORBABLE MESH CLOSURE OF ABDOMEN;  Surgeon: [name], MD;  Location: [facility];  Service: General;  Laterality: N/A;   INCISIONAL HERNIA REPAIR N/A 08/26/2021   Procedure: INCISIONAL HERNIA REPAIR;  Surgeon: [name], MD;  Location: [facility] OR;  Service: General;  Laterality: N/A;   INSERTION OF MESH N/A 08/26/2021   Procedure: INSERTION OF MESH;  Surgeon: [name], MD;  Location: [facility] OR;  Service: General;  Laterality: N/A;   LAPAROTOMY N/A 07/29/2020   Procedure: EXPLORATORY LAPAROTOMY, REPAIR OF VENTRAL HERNIA;  Surgeon: [name], MD;  Location: [facility] OR;  Service: General;  Laterality: N/A;   LAPAROTOMY N/A 10/29/2022   Procedure: EXPLORATORY LAPAROTOMY  WITH REPAIR OF SMALL BOWEL PERFORATION, AND PLACEMENT OF WOUND VAC;  Surgeon: Harriette Bouillon, MD;  Location: [facility];  Service: General;  Laterality: N/A;   LAPAROTOMY N/A 10/31/2022   Procedure: EXPLORATORY LAPAROTOMY - WASH OUT OF ABDOMEN AND WOUND VAC PLACEMENT;  Surgeon: Abigail Miyamoto, MD;  Location: [facility];  Service: General;  Laterality: N/A;   LAPAROTOMY N/A 11/02/2022   Procedure: EXPLORATORY LAPAROTOMY Wound wash out of abdomen and wound vac change;  Surgeon: Abigail Miyamoto, MD;  Location: [facility];  Service: General;  Laterality: N/A;   MYOMECTOMY     XI ROBOTIC ASSISTED VENTRAL HERNIA N/A 10/23/2022   Procedure: ROBOTIC RECURRENT INCISIONAL HERNIA REPAIR WITH MESH WITH BILATERAL TRANVERSUS ABDOMINUS MYOFACIAL RELEASE AND BILATERAL POSTERIOR RECTUS MYOFACIAL RELEASE;  Surgeon: [name], MD;  Location: [facility];  Service: General;  Laterality: N/A;    Assessment & Plan Clinical Impression: Patient is a 57 year old right-handed female with history obesity with BMI 36.91, hypertension, diabetes mellitus, asthma Per chart review lives with spouse. 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 1 level home with ramped entrance. Presented to [facility] 10/23/2022 for ventral hernia status post robotic incisional repair 10/23/2022. Postoperative course complicated by fever and drainage of foul-smelling fluid from her lower abdominal incision site, bowel perforation, sepsis and peritonitis status post exploratory laparotomy 6/16 and washout on 6/18, 6/20 with closing of abdomen 6/22 and wound VAC remains in place. She did require short-term intubation through 11/08/2022. Cultures with group  G streptococci/actinomyces,bacteroides ovatus beta lactamase positive and Candida albicans. Placed on broad-spectrum antibiotics. CT of the abdomen pelvis completed to rule out any further abscesses showed a loculated fluid collection in the anterior lower abdomen that radiology felt  could be due to developing sepsis that was reviewed by general surgery and felt that collection of fluids seen on CT were fairly expected findings and not likely to be infected. Patient did have JP drains in place that is since been removed. Antibiotic has been transition to Augmentin 875-125 mg every 12 hours as well as Diflucan per infectious disease and awaiting plan by infectious disease on duration of antibiotic.Marland Kitchen Lovenox was added for DVT prophylaxis. Hospital course acute blood loss anemia 8.2 and monitored. Diet has been advanced to regular after initially being on TPN for nutritional support. Therapy evaluations completed due to patient decreased functional mobility was admitted for a comprehensive rehab program.   Patient currently requires min with mobility secondary to muscle weakness, decreased cardiorespiratoy endurance, and decreased standing balance and decreased balance strategies.  Prior to hospitalization, patient was independent  with mobility and lived with Spouse in a House home.  Home access is  Ramped entrance (Currently putting in a small ramp).  Patient will benefit from skilled PT intervention to maximize safe functional mobility, minimize fall risk, and decrease caregiver burden for planned discharge home with intermittent assist.  Anticipate patient will benefit from follow up OP at discharge.  PT - End of Session Activity Tolerance: Tolerates 30+ min activity with multiple rests Endurance Deficit: Yes Endurance Deficit Description: Global deconditioning PT Assessment Rehab Potential (ACUTE/IP ONLY): Good PT Barriers to Discharge: Insurance for SNF coverage;Wound Care;Nutrition means PT Patient demonstrates impairments in the following area(s): Balance;Endurance;Skin Integrity PT Transfers Functional Problem(s): Bed Mobility;Bed to Chair;Car;Furniture PT Locomotion Functional Problem(s): Ambulation;Stairs PT Plan PT Intensity: Minimum of 1-2 x/day ,45 to 90 minutes PT  Frequency: 5 out of 7 days PT Duration Estimated Length of Stay: 7-10 days PT Treatment/Interventions: Ambulation/gait training;Community reintegration;DME/adaptive equipment instruction;Psychosocial support;Neuromuscular re-education;Stair training;UE/LE Strength taining/ROM;Balance/vestibular training;Discharge planning;Pain management;Skin care/wound management;Therapeutic Activities;UE/LE Coordination activities;Cognitive remediation/compensation;Disease management/prevention;Functional mobility training;Patient/family education;Therapeutic Exercise;Visual/perceptual remediation/compensation PT Transfers Anticipated Outcome(s): ModI with LRAD PT Locomotion Anticipated Outcome(s): ModI with LRAD PT Recommendation Recommendations for Other Services: Neuropsych consult;Therapeutic Recreation consult Therapeutic Recreation Interventions: Stress management;Outing/community reintergration Follow Up Recommendations: Outpatient PT Patient destination: Home Equipment Recommended: To be determined Equipment Details: TBD   PT Evaluation Precautions/Restrictions Precautions Precautions: Fall;Other (comment) (Abdominal precautions) Precaution Comments: Abdominal wound vac, abdominal precautions Restrictions Weight Bearing Restrictions: No Pain Pain Assessment Pain Scale: 0-10 Pain Score: 5  Pain Type: Surgical pain Pain Location: Abdomen Pain Orientation: Mid;Lower Pain Descriptors / Indicators: Aching Pain Frequency: Constant Pain Onset: On-going Patients Stated Pain Goal: 0 Pain Intervention(s): Medication (See eMAR) Pain Interference Pain Interference Pain Effect on Sleep: 1. Rarely or not at all Pain Interference with Therapy Activities: 1. Rarely or not at all Pain Interference with Day-to-Day Activities: 1. Rarely or not at all Home Living/Prior Functioning Home Living Available Help at Discharge: Family;Friend(s);Available 24 hours/day Type of Home: House Home Access: Ramped  entrance (Currently putting in a small ramp) Home Layout: One level Bathroom Shower/Tub: Engineer, manufacturing systems: Handicapped height Bathroom Accessibility: No Additional Comments: Patient reports a walker will not fit into her bathroom  Lives With: Spouse Prior Function Level of Independence: Independent with basic ADLs;Independent with transfers;Independent with gait;Independent with homemaking with ambulation  Able to Take Stairs?: Yes Driving: Yes Vocation: Full time employment 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 Leisure: Hobbies-yes (Comment) (Crotchet, read books, listening to music) Vision/Perception  Vision - History Ability to See in Adequate Light: 0 Adequate Vision - Assessment Additional Comments: Patient with amblyopia of L eye at baseline- Normally stays abducted with downward gaze Perception Perception: Within Functional Limits Praxis Praxis: Intact  Cognition Overall Cognitive Status: Within Functional Limits for tasks assessed Arousal/Alertness: Awake/alert Orientation Level: Oriented X4 Year: 2024 Month: July Day of Week: Correct Memory: Appears intact Awareness: Appears intact Problem Solving: Appears intact Safety/Judgment: Appears intact Sensation Sensation Light Touch: Appears Intact Additional Comments: Sensation deficits in abdomen secondary to wound vac Coordination Gross Motor Movements are Fluid and Coordinated: No Fine Motor Movements are Fluid and Coordinated: Yes Motor  Motor Motor: Within Functional Limits Motor - Skilled Clinical Observations: Global deconditioning   Trunk/Postural Assessment  Cervical Assessment Cervical Assessment: Within Functional Limits Thoracic Assessment Thoracic Assessment: Exceptions to [facility] (Rounded shoulder) Lumbar Assessment Lumbar Assessment: Exceptions to [facility] (Posterior pelvic tilt) Postural Control Postural Control: Deficits on evaluation Righting Reactions: Delayed  and limited secondary to global deconditioning and abdominal wound vac  Balance Balance Balance Assessed: Yes Static Sitting Balance Static Sitting - Balance Support: Feet supported;Bilateral upper extremity supported Static Sitting - Level of Assistance: 6: Modified independent (Device/Increase time) Dynamic Sitting Balance Dynamic Sitting - Balance Support: During functional activity;Feet supported Dynamic Sitting - Level of Assistance: 5: Stand by assistance Static Standing Balance Static Standing - Balance Support: Bilateral upper extremity supported Static Standing - Level of Assistance: 5: Stand by assistance;4: Min assist (CGA) Dynamic Standing Balance Dynamic Standing - Balance Support: During functional activity;Bilateral upper extremity supported Dynamic Standing - Level of Assistance: 4: Min assist Extremity Assessment      RLE Assessment RLE Assessment: Within Functional Limits General Strength Comments: Grossly 4+/5 LLE Assessment LLE Assessment: Within Functional Limits General Strength Comments: Grossly 4+/5  Care Tool Care Tool Bed Mobility Roll left and right activity   Roll left and right assist level: Minimal Assistance - Patient > 75%    Sit to lying activity   Sit to lying assist level: Minimal Assistance - Patient > 75%    Lying to sitting on side of bed activity   Lying to sitting on side of bed assist level: the ability to move from lying on the back to sitting on the side of the bed with no back support.: Minimal Assistance - Patient > 75%     Care Tool Transfers Sit to stand transfer   Sit to stand assist level: Minimal Assistance - Patient > 75%    Chair/bed transfer   Chair/bed transfer assist level: Minimal Assistance - Patient > 75%     Psychologist, clinical transfer assist level: Minimal Assistance - Patient > 75%      Care Tool Locomotion Ambulation   Assist level: Minimal Assistance - Patient > 75% Assistive  device: Walker-rolling Max distance: 180  Walk 10 feet activity   Assist level: Minimal Assistance - Patient > 75% Assistive device: Walker-rolling   Walk 50 feet with 2 turns activity   Assist level: Minimal Assistance - Patient > 75% Assistive device: Walker-rolling  Walk 150 feet activity   Assist level: Minimal Assistance - Patient > 75% Assistive device: Walker-rolling  Walk 10 feet on uneven surfaces activity   Assist level: Minimal Assistance - Patient > 75% Assistive device: Walker-rolling  Stairs   Assist level: Minimal Assistance -  Patient > 75% Stairs assistive device: 2 hand rails Max number of stairs: 4  Walk up/down 1 step activity   Walk up/down 1 step (curb) assist level: Minimal Assistance - Patient > 75% Walk up/down 1 step or curb assistive device: 2 hand rails  Walk up/down 4 steps activity   Walk up/down 4 steps assist level: Minimal Assistance - Patient > 75% Walk up/down 4 steps assistive device: 2 hand rails  Walk up/down 12 steps activity Walk up/down 12 steps activity did not occur: Safety/medical concerns      Pick up small objects from floor   Pick up small object from the floor assist level: Minimal Assistance - Patient > 75% Pick up small object from the floor assistive device: RW  Wheelchair Is the patient using a wheelchair?: No          Wheel 50 feet with 2 turns activity      Wheel 150 feet activity        Refer to Care Plan for Long Term Goals  SHORT TERM GOAL WEEK 1 PT Short Term Goal 1 (Week 1): STGs=LTGs secondary to ELOS  Recommendations for other services: Neuropsych and Therapeutic Recreation  Stress management and Outing/community reintegration  Skilled Therapeutic Intervention Mobility Bed Mobility Bed Mobility: Rolling Right;Rolling Left;Supine to Sit;Sit to Supine Rolling Right: Minimal Assistance - Patient > 75% Rolling Left: Minimal Assistance - Patient > 75% Supine to Sit: Minimal Assistance - Patient > 75% Sit to  Supine: Minimal Assistance - Patient > 75% Transfers Transfers: Sit to Stand;Stand to Sit;Stand Pivot Transfers Sit to Stand: Minimal Assistance - Patient > 75% Stand to Sit: Minimal Assistance - Patient > 75% Stand Pivot Transfers: Minimal Assistance - Patient > 75% Stand Pivot Transfer Details: Verbal cues for sequencing;Verbal cues for safe use of DME/AE;Verbal cues for technique Transfer (Assistive device): Rolling walker Locomotion  Gait Ambulation: Yes Gait Assistance: Minimal Assistance - Patient > 75% Gait Distance (Feet): 180 Feet Assistive device: Rolling walker Gait Assistance Details: Verbal cues for sequencing;Verbal cues for precautions/safety;Verbal cues for gait pattern Gait Gait: Yes Gait Pattern:  (Decreased cadence with flexed posturing and increased UE use) Gait velocity: decreased Stairs / Additional Locomotion Stairs: Yes Stairs Assistance: Minimal Assistance - Patient > 75% Stair Management Technique: Two rails Number of Stairs: 4 Height of Stairs: 6 Ramp: Minimal Assistance - Patient >75% Curb: Minimal Assistance - Patient >75% Wheelchair Mobility Wheelchair Mobility: No  Skilled Intervention: 1st Treatment Session- Patient greeted sitting upright in wheelchair in her room and agreeable to PT treatment session. Evaluation completed (see details above and below) with education on PT POC and goals and individual treatment initiated with focus on dynamic stability, gait training, stair mobility, discharge planning, education, etc. Patient completed all functional mobility with the use of a RW and Min/CGA for safety/stability. Patient required extended seated rest break throughout treatment session secondary to impaired endurance/activity tolerance. Patient returned to her room and left sitting upright in wheelchair with posey belt on, call bell within reach and all needs met.   2nd Treatment Session- Patient greeted sitting upright in wheelchair in her room  with cousin present and agreeable to PT treatment session. Patient wheeled to/from her room and day room for time management and energy conservation. Patient denied pain throughout treatment session- Only reports "soreness."   Patient stood with RW and performed alternating foot taps to 4" step with 1.5# weights donned to each ankle- VC throughout for decreased UE use and fully lifting foot off  the step vs dragging. Patient performed 2 x 10 on each LE.   Patient performed x10 sit/stands without the use of the RW and CGA for safety- VC for improved postural extension and increased BLE use compared to UE use.   Patient returned to her room and stood from wheelchair with RW and CGA- Patient ambulated ~10' to the EOB with CGA for safety. Patient transitioned from sitting EOB to supine with MinA for BLE management and VC for abdominal precautions. Patient left supine in bed with call bell within reach, bed alarm on and all needs met.    Discharge Criteria: Patient will be discharged from PT if patient refuses treatment 3 consecutive times without medical reason, if treatment goals not met, if there is a change in medical status, if patient makes no progress towards goals or if patient is discharged from hospital.  The above assessment, treatment plan, treatment alternatives and goals were discussed and mutually agreed upon: by patient  [place]  Tanya Crothers 11/17/2022, 11:30 AM

## 2022-11-17 NOTE — Progress Notes (Signed)
Met with patient. Introduced self and oriented to rehab. Informed of team conference every Tuesday. Discussed educational binder.  Recent dx of DMII. Says that started checking blood sugar and taking metformin. Discussed side effects but suggested to continue to take medication and when going to follow up if GI upset continues to discuss with PCP but do not stop medication. Discussed diet and foods to increase Ca, Protein and to improve trig. Also provided information on educational channels. Will discuss wound care as gets closer to discharge. Provided wound care times to schedule blocks for  MWF from 8-8:30 (vac changes).  All needs met.

## 2022-11-17 NOTE — Progress Notes (Signed)
PMR Admission Coordinator Pre-Admission Assessment   Patient: Kaitlyn Bray is an 58 y.o., female MRN: [id number] DOB: May 22, 1965 Height: 5\' 5"  (165.1 cm) Weight: 100.6 kg   Insurance Information HMO: yes    PPO:      PCP:      IPA:      80/20:      OTHER:  PRIMARY: [employer] [facility] Byars      Policy#: [id number]      Subscriber: patient CM Name: Alexia Freestone      Phone#: [phone]     Fax#: [phone] Pre-Cert#: [id number] auth for CIR admit 7/3 from [place] with [employer] with updates due to fax listed above on 11/28/22      Employer:  Benefits:  Phone #:      Name:  Eff. Date: 05/15/22     Deduct: $0      Out of Pocket Max: $9400 (met $2290.18)      Life Max: n/a CIR: $2500/days 1-3      SNF: $2500/days 1-3 Outpatient:      Co-Pay: $80/visit Home Health:       Co-Pay: $80/visit DME: 50%     Co-Pay: 50% Providers: in-network SECONDARY:       Policy#:      Phone#:    Artist:       Phone#:    The Data processing manager" for patients in Inpatient Rehabilitation Facilities with attached "Privacy Act Statement-Health Care Records" was provided and verbally reviewed with: N/A   Emergency Contact Information Contact Information       Name Relation Home Work Mobile    [place] Spouse [phone]   [phone]    [place], Adams [phone]   [phone]    Sherryl Manges     [phone]    Redgie Grayer     [phone]           Current Medical History  Patient Admitting Diagnosis: debility d/t small bowel perforation History of Present Illness: Pt is a 57 year old female with medical hx significant for: obesity, HTN, diabetes. Pt presented to [facility] on 10/23/22 for planned robotic hernia repair by Dr. Dossie Der due to recurrent ventral hernia. Pt had fever and placed on 2L O2 on 6/11. Upgraded to regular diet on 6/13. Fevers returned on 6/13; started on Zosyn for possible UA. Copious dark brown, foul-smelling fluid burst  through abdominal wall and pt sent to OR emergently for exploratory laparotomy on 10/29/22. Pt required resection of small bowel perforation and primary anastomosis and excision of mesh with placement of wound VAC; abdomen left open. Pt remained intubated. Pt returned to OR for washout of abdomen and wound VAC change by Dr. Magnus Ivan on 10/31/22. Pt remained intubated. TPN started on 6/19. Pt returned to OR for washout of abdomen by Dr. Magnus Ivan on 11/02/22. Pt remained intubated. Infectious Disease consulted for polymicrobial infection (group G streptococci, actinomyecs, bacteroids ovatus beta lactamase positive and candida albicans). Pt started on Fluconazole. Pt returned to OR for closure of abdomen and application of wound VAC by Dr. Dossie Der on 11/04/22. Pt extubated to 2L nasal cannula on 11/08/22. Started on PO diet on 6/28. Therapy evaluations completed and CIR recommended d/t pt's deficits in functional mobility and inability to complete ADLs independently.    Patient's medical record from [facility] has been reviewed by the rehabilitation admission coordinator and physician.   Past Medical History      Past Medical History:  Diagnosis Date   Asthma  Complication of anesthesia     Heart murmur     Hernia     Hypertension     New onset type 2 diabetes mellitus (HCC) 09/15/2022   PONV (postoperative nausea and vomiting)     Pre-diabetes     S/P hernia repair 08/26/2021   SBO (small bowel obstruction) (HCC) 09/15/2021      Has the patient had major surgery during 100 days prior to admission? Yes   Family History   family history includes Alzheimer's disease in her maternal grandfather; COPD in her father; Dementia in her mother; Heart disease in her maternal grandfather, maternal grandmother, and mother; Hyperlipidemia in her mother; Hypertension in her mother; Mental illness in her father; Rheum arthritis in her father; Stroke in her mother; Uterine cancer in her paternal  grandmother.   Current Medications   Current Facility-Administered Medications:    0.9 %  sodium chloride infusion, 250 mL, Intravenous, Continuous, Abigail Miyamoto, MD, Last Rate: 10 mL/hr at 11/13/22 1600, Infusion Verify at 11/13/22 1600   acetaminophen (TYLENOL) tablet 1,000 mg, 1,000 mg, Oral, Q6H, Gross, Viviann Spare, MD, 1,000 mg at 11/16/22 0804   albuterol (PROVENTIL) (2.5 MG/3ML) 0.083% nebulizer solution 2.5 mg, 2.5 mg, Nebulization, Q6H PRN, Stechschulte, Hyman Hopes, MD   amoxicillin-clavulanate (AUGMENTIN) 875-125 MG per tablet 1 tablet, 1 tablet, Oral, Q12H, Daiva Eves, Lisette Grinder, MD, 1 tablet at 11/16/22 1610   ascorbic acid (VITAMIN C) tablet 500 mg, 500 mg, Oral, Daily, Gross, Viviann Spare, MD, 500 mg at 11/16/22 0804   bismuth subsalicylate (PEPTO BISMOL) 262 MG/15ML suspension 30 mL, 30 mL, Oral, Q8H PRN, Karie Soda, MD   Chlorhexidine Gluconate Cloth 2 % PADS 6 each, 6 each, Topical, Daily, Abigail Miyamoto, MD, 6 each at 11/15/22 0957   cyanocobalamin (VITAMIN B12) tablet 500 mcg, 500 mcg, Oral, Daily, Stechschulte, Hyman Hopes, MD, 500 mcg at 11/16/22 0802   enoxaparin (LOVENOX) injection 40 mg, 40 mg, Subcutaneous, Q24H, Cornett, Thomas, MD, 40 mg at 11/16/22 0801   famotidine (PEPCID) tablet 20 mg, 20 mg, Oral, BID, Karie Soda, MD, 20 mg at 11/16/22 0805   feeding supplement (ENSURE ENLIVE / ENSURE PLUS) liquid 237 mL, 237 mL, Oral, BID BM, Stechschulte, Hyman Hopes, MD, 237 mL at 11/15/22 1414   fluconazole (DIFLUCAN) tablet 400 mg, 400 mg, Oral, Daily, Daiva Eves, Lisette Grinder, MD, 400 mg at 11/16/22 9604   gabapentin (NEURONTIN) capsule 400 mg, 400 mg, Oral, TID, Karie Soda, MD, 400 mg at 11/16/22 0803   losartan (COZAAR) tablet 25-50 mg, 25-50 mg, Oral, Daily, 50 mg at 11/16/22 0802 **AND** hydrochlorothiazide (HYDRODIURIL) tablet 6.25-12.5 mg, 6.25-12.5 mg, Oral, Daily, Stechschulte, Hyman Hopes, MD, 12.5 mg at 11/16/22 5409   HYDROmorphone (DILAUDID) injection 0.5-2 mg, 0.5-2 mg,  Intravenous, Q3H PRN, Karie Soda, MD, 1 mg at 11/15/22 8119   insulin aspart (novoLOG) injection 0-15 Units, 0-15 Units, Subcutaneous, TID AC & HS, Stechschulte, Hyman Hopes, MD, 2 Units at 11/16/22 0805   lidocaine (XYLOCAINE) 4 % external solution, , Topical, Daily PRN, Stechschulte, Hyman Hopes, MD, Given at 11/08/22 1719   liver oil-zinc oxide (DESITIN) 40 % ointment, , Topical, BID, Karie Soda, MD, Given at 11/16/22 0806   loperamide (IMODIUM) capsule 2 mg, 2 mg, Oral, Once, Karie Soda, MD   meclizine (ANTIVERT) tablet 25 mg, 25 mg, Oral, TID PRN, Karie Soda, MD   methocarbamol (ROBAXIN) 500 mg in dextrose 5 % 50 mL IVPB, 500 mg, Intravenous, Q6H PRN, Stechschulte, Hyman Hopes, MD   nystatin (MYCOSTATIN/NYSTOP)  topical powder, , Topical, BID, Janeece Riggers, NP, Given at 11/16/22 0807   ondansetron [facility]) injection 4 mg, 4 mg, Intravenous, Q6H PRN, Cornett, Thomas, MD, 4 mg at 10/30/22 1610   oxyCODONE (Oxy IR/ROXICODONE) immediate release tablet 10 mg, 10 mg, Oral, Q4H PRN, Stechschulte, Hyman Hopes, MD   oxyCODONE (Oxy IR/ROXICODONE) immediate release tablet 5 mg, 5 mg, Oral, Q4H PRN, Stechschulte, Hyman Hopes, MD, 5 mg at 11/11/22 0920   polycarbophil (FIBERCON) tablet 625 mg, 625 mg, Oral, BID, Karie Soda, MD, 625 mg at 11/16/22 0804   prochlorperazine (COMPAZINE) injection 10 mg, 10 mg, Intravenous, Q4H PRN, Cornett, Maisie Fus, MD, 10 mg at 10/30/22 0507   simethicone (MYLICON) chewable tablet 80 mg, 80 mg, Per Tube, QID PRN, Abigail Miyamoto, MD   sodium chloride (OCEAN) 0.65 % nasal spray 1 spray, 1 spray, Each Nare, PRN, Henry Russel, MD, 1 spray at 11/09/22 2310   sodium chloride flush (NS) 0.9 % injection 10-40 mL, 10-40 mL, Intracatheter, Q12H, Abigail Miyamoto, MD, 10 mL at 11/15/22 2110   sodium chloride flush (NS) 0.9 % injection 10-40 mL, 10-40 mL, Intracatheter, PRN, Abigail Miyamoto, MD   Patients Current Diet:  Diet Order                  Diet - low sodium heart healthy              Diet regular Room service appropriate? Yes; Fluid consistency: Thin  Diet effective now                         Precautions / Restrictions Precautions Precautions: Fall, Other (comment) Precaution Comments: 2 jp drains; abdominal wound vac; abdominal precautions Restrictions Weight Bearing Restrictions: No    Has the patient had 2 or more falls or a fall with injury in the past year? No   Prior Activity Level Community (5-7x/wk): drives; gets out of house often   Prior Functional Level Self Care: Did the patient need help bathing, dressing, using the toilet or eating? Independent   Indoor Mobility: Did the patient need assistance with walking from room to room (with or without device)? Independent   Stairs: Did the patient need assistance with internal or external stairs (with or without device)? Independent   Functional Cognition: Did the patient need help planning regular tasks such as shopping or remembering to take medications? Independent   Patient Information Are you of Hispanic, Latino/a,or Spanish origin?: A. No, not of Hispanic, Latino/a, or Spanish origin What is your race?: A. White Do you need or want an interpreter to communicate with a doctor or health care staff?: 0. No   Patient's Response To:  Health Literacy and Transportation Is the patient able to respond to health literacy and transportation needs?: Yes Health Literacy - How often do you need to have someone help you when you read instructions, pamphlets, or other written material from your doctor or pharmacy?: Never In the past 12 months, has lack of transportation kept you from medical appointments or from getting medications?: No In the past 12 months, has lack of transportation kept you from meetings, work, or from getting things needed for daily living?: No   Journalist, newspaper / Equipment Home Assistive Devices/Equipment: None Home Equipment: Rollator (4 wheels), Shower seat   Prior  Device Use: Indicate devices/aids used by the patient prior to current illness, exacerbation or injury? None of the above   Current Functional Level Cognition   Overall  Cognitive Status: Within Functional Limits for tasks assessed Current Attention Level: Selective Orientation Level: Oriented X4 General Comments: slow processing    Extremity Assessment (includes Sensation/Coordination)   Upper Extremity Assessment: Generalized weakness  Lower Extremity Assessment: Defer to PT evaluation     ADLs   Overall ADL's : Needs assistance/impaired Eating/Feeding: NPO Grooming: Wash/dry hands, Wash/dry face, Oral care, Brushing hair, Set up, Supervision/safety, Sitting Upper Body Bathing: Minimal assistance, Sitting Lower Body Bathing: Maximal assistance, Sit to/from stand Upper Body Dressing : Minimal assistance, Sitting Lower Body Dressing: Total assistance Toilet Transfer: Moderate assistance, +2 for safety/equipment, Stand-pivot Toilet Transfer Details (indicate cue type and reason): simulated to recliner Toileting- Clothing Manipulation and Hygiene: Maximal assistance, Sit to/from stand Toileting - Clothing Manipulation Details (indicate cue type and reason): incontinenet BM Functional mobility during ADLs: Moderate assistance, +2 for physical assistance, Rolling walker (2 wheels) General ADL Comments: no BSC in present this session, requested BSC from nursing staff and infomred RN that pt can be assisted to [facility] with SPT mod A     Mobility   Overal bed mobility: Needs Assistance Bed Mobility: Supine to Sit Rolling: Min assist Sidelying to sit: Min assist, HOB elevated (increased time) General bed mobility comments: cues for log roll technique to maintain abdominal precuations     Transfers   Overall transfer level: Needs assistance Equipment used:  (STEADY) Transfers: Sit to/from Stand, Bed to chair/wheelchair/BSC Sit to Stand: +2 safety/equipment, From elevated surface, Min  assist Bed to/from chair/wheelchair/BSC transfer type:: Step pivot Step pivot transfers: Mod assist General transfer comment: pt able to pull to stand from EOB on STEADY lift with min A x 1 and min guard x 1 for safety, pt demonstrated ability to maintain static standing on lift for 2;47 with close S and min cues. pt tolerated transfer well to recliner and able to follow commands for safe and proper technique. PT provided nursing ed on use of STEADY lift to return to bed     Ambulation / Gait / Stairs / Wheelchair Mobility   Ambulation/Gait General Gait Details: NT     Posture / Balance Balance Overall balance assessment: Needs assistance Sitting-balance support: Feet supported Sitting balance-Leahy Scale: Fair Standing balance support: Bilateral upper extremity supported, Reliant on assistive device for balance, During functional activity Standing balance-Leahy Scale: Poor Standing balance comment: reliant on UE support.     Special needs/care consideration Continuous Drip IV  039% sodium chloride infusion, Oxygen 2L nasal cannula, Wound Vac abdomen/medial, Skin Surgical incision: abdomen; JP drains; Weeping: abdomen/right, lower, Ecchymosis: abdomen/bilateral; Erythema/Redness; back, buttocks/bilateral; Petechiae: back, leg/bilateral, Bladder incontinence, External urinary catheter and Diabetic management Novolog 0-15 units every 4 hours    Previous Home Environment (from acute therapy documentation) Living Arrangements: Spouse/significant other  Lives With: Spouse Available Help at Discharge: Family, Friend(s), Available 24 hours/day Type of Home: House Home Layout: One level Home Access: Ramped entrance Bathroom Shower/Tub: Engineer, manufacturing systems: Handicapped height Bathroom Accessibility: Yes How Accessible: Accessible via walker Home Care Services: No   Discharge Living Setting Plans for Discharge Living Setting: House (friend Arlene's house) Type of Home at  Discharge: House Discharge Home Layout: One level Discharge Home Access: Stairs to enter Entrance Stairs-Rails: Right, Left, Can reach both (Back steps have rails and front steps have no rails) Entrance Stairs-Number of Steps: 8 in back, 3 in front Discharge Bathroom Shower/Tub: Tub/shower unit Discharge Bathroom Toilet: Standard Discharge Bathroom Accessibility: Yes How Accessible: Accessible via walker Does the patient have any problems  obtaining your medications?: No   Social/Family/Support Systems Anticipated Caregiver: Cira Rue (friend), Molly Maduro (husband) Anticipated Industrial/product designer Information: Arlene: [phone] Maduro: [phone] Caregiver Availability: 24/7 Discharge Plan Discussed with Primary Caregiver: Yes Is Caregiver In Agreement with Plan?: Yes Does Caregiver/Family have Issues with Lodging/Transportation while Pt is in Rehab?: No   Goals Patient/Family Goal for Rehab: PT/OT supervision household level mobility/ADLs, SLP n/a Expected length of stay: 14-18 days Additional Information: Discharge plan: home to Arlene's house with 24/7 supervision from her.  Has supportive spouse, children, and in-laws as well Pt/Family Agrees to Admission and willing to participate: Yes Program Orientation Provided & Reviewed with Pt/Caregiver Including Roles  & Responsibilities: Yes  Barriers to Discharge: Insurance for SNF coverage, Decreased caregiver support   Decrease burden of Care through IP rehab admission: NA   Possible need for SNF placement upon discharge: Not anticipated   Patient Condition: I have reviewed medical records from [facility], spoken with CM, and friend Arlene. I discussed via phone for inpatient rehabilitation assessment.  Patient will benefit from ongoing PT and OT, can actively participate in 3 hours of therapy a day 5 days of the week, and can make measurable gains during the admission.  Patient will also benefit from the coordinated team  approach during an Inpatient Acute Rehabilitation admission.  The patient will receive intensive therapy as well as Rehabilitation physician, nursing, social worker, and care management interventions.  Due to bladder management, safety, skin/wound care, disease management, medication administration, pain management, and patient education the patient requires 24 hour a day rehabilitation nursing.  The patient is currently mod +2 with mobility and basic ADLs.  Discharge setting and therapy post discharge at home with home health is anticipated.  Patient has agreed to participate in the Acute Inpatient Rehabilitation Program and will admit today.   Preadmission Screen Completed By:  Domingo Pulse, 11/16/2022 9:11 AM ______________________________________________________________________   Discussed status with Dr. Wynn Banker on 11/16/22  at 9:11 AM  and received approval for admission today.   Admission Coordinator:  Domingo Pulse, CCC-SLP, time 9:11 AM Dorna Bloom 11/16/22     Assessment/Plan: Diagnosis:  Debility due to peritonitis  Does the need for close, 24 hr/day Medical supervision in concert with the patient's rehab needs make it unreasonable for this patient to be served in a less intensive setting? Yes Co-Morbidities requiring supervision/potential complications: morbid obesity . Diabetes  Due to bladder management, bowel management, safety, skin/wound care, disease management, medication administration, pain management, and patient education, does the patient require 24 hr/day rehab nursing? Yes Does the patient require coordinated care of a physician, rehab nurse, PT, OT, and SLP to address physical and functional deficits in the context of the above medical diagnosis(es)? Yes Addressing deficits in the following areas: balance, endurance, locomotion, strength, transferring, bowel/bladder control, bathing, dressing, feeding, grooming, toileting, and psychosocial support Can the  patient actively participate in an intensive therapy program of at least 3 hrs of therapy 5 days a week? Yes The potential for patient to make measurable gains while on inpatient rehab is good Anticipated functional outcomes upon discharge from inpatient rehab: supervision PT, supervision OT, n/a SLP Estimated rehab length of stay to reach the above functional goals is: 14-18d Anticipated discharge destination: Home 10. Overall Rehab/Functional Prognosis: good     MD Signature: Erick Colace M.D. [facility] Fellow Am Acad of Phys Med and Rehab Diplomate Am Board of Electrodiagnostic Med Fellow Am Board of Interventional Pain

## 2022-11-17 NOTE — Progress Notes (Signed)
PROGRESS NOTE   Subjective/Complaints:  No events overnight. Vitals stable Last BM 7/4; continent of bowel, mixed bladder. Patient seen ambulating around the gym, endorses she overall feels weak but is doing extremely well!  Denies any pain with VAC change this a.m.  Is working on improving p.o. intakes, states she has not had an adequate bowel movement in a while.  She is passing gas.  ROS: Denies fevers, chills, N/V, abdominal pain, constipation, diarrhea, SOB, cough, chest pain, new weakness or paraesthesias.    Objective:   No results found. Recent Labs    11/16/22 0235 11/16/22 1700  WBC 11.3* 12.0*  HGB 8.2* 8.3*  HCT 27.8* 27.6*  PLT 386 386   Recent Labs    11/16/22 0235 11/16/22 1700  NA 136 135  K 3.6 3.4*  CL 98 97*  CO2 27 26  GLUCOSE 110* 103*  BUN 15 14  CREATININE 0.52 0.55  CALCIUM 8.2* 8.4*    Intake/Output Summary (Last 24 hours) at 11/17/2022 0910 Last data filed at 11/17/2022 0733 Gross per 24 hour  Intake 477 ml  Output --  Net 477 ml        Physical Exam: Vital Signs Blood pressure (!) 140/77, pulse 87, temperature 98.2 F (36.8 C), resp. rate 18, height 5\' 5"  (1.651 m), weight 99.9 kg, SpO2 97 %.  General: No acute distress.  Ambulating with rolling walker in gym CGA level with assistance for managing wound VAC. Mood and affect are appropriate Heart: Regular rate and rhythm no rubs murmurs or extra sounds Lungs: Clear to auscultation, breathing unlabored, no rales or wheezes Abdomen: Positive bowel sounds, nontender to palpation, distended, mildline wound vac with excellent seal and serous output. Extremities: No clubbing, cyanosis, or edema Skin: No evidence of breakdown, no evidence of rash, no drainage from former JP sites  Neurologic: Awake, alert, and oriented x 3.  Appropriate cognition., motor strength is 5-/5 in bilateral deltoid, bicep, tricep, grip, hip flexor, knee  extensors, ankle dorsiflexor and plantar flexor Sensory exam normal sensation to light touch and proprioception in bilateral upper mildly diminished lower extremities Cerebellar exam normal finger to nose to finger Musculoskeletal: Full range of motion in all 4 extremities. No joint swelling    Assessment/Plan: 1. Functional deficits which require 3+ hours per day of interdisciplinary therapy in a comprehensive inpatient rehab setting. Physiatrist is providing close team supervision and 24 hour management of active medical problems listed below. Physiatrist and rehab team continue to assess barriers to discharge/monitor patient progress toward functional and medical goals  Care Tool:  Bathing              Bathing assist       Upper Body Dressing/Undressing Upper body dressing        Upper body assist      Lower Body Dressing/Undressing Lower body dressing            Lower body assist       Toileting Toileting    Toileting assist       Transfers Chair/bed transfer  Transfers assist           Locomotion Ambulation   Ambulation assist  Walk 10 feet activity   Assist           Walk 50 feet activity   Assist           Walk 150 feet activity   Assist           Walk 10 feet on uneven surface  activity   Assist           Wheelchair     Assist               Wheelchair 50 feet with 2 turns activity    Assist            Wheelchair 150 feet activity     Assist          Blood pressure (!) 140/77, pulse 87, temperature 98.2 F (36.8 C), resp. rate 18, height 5\' 5"  (1.651 m), weight 99.9 kg, SpO2 97 %.    Medical Problem List and Plan: 1. Functional deficits secondary to debility secondary to ventral hernia repair 10/23/2022 complicated by bowel perforation, sepsis and peritonitis status post exploratory laparotomy 6/16 and washout on 618, 6/20 with closing of abdomen 6/22.   Continue wound VAC per general surgery             -patient may not shower             -ELOS/Goals: 14-18d  2.  Antithrombotics: -DVT/anticoagulation:  Pharmaceutical: Lovenox             -antiplatelet therapy: N/A 3. Pain Management: Neurontin 400 mg 3 times daily, oxycodone as needed  Pain well-controlled on current regimen, monitor 4. Mood/Behavior/Sleep: Provide emotional support             -antipsychotic agents: N/A 5. Neuropsych/cognition: This patient is capable of making decisions on her own behalf. 6. Skin/Wound Care: Routine skin checks   - VAC changes with WCN M/W/F at 8 am; therapies notified  7. Fluids/Electrolytes/Nutrition: Routine in and outs with follow-up chemistries  - K 3.4; k dur 20 meq BID for 3 days, recheck Monday. Good PO intakes.  Ascorbic acid, B12 for wound healing.  8.  ID.  Continue Augmentin/Diflucan as per Dr. Algis Liming of infectious disease.  Await plan by infectious disease on duration of antibiotic -  - Infectious disease was consulted and recommend one month of Augmentin and fluconazole with repeat CT scan to make sure that all fluid collections are resolving prior to discontinuing antibiotics.   -Discontinue PICC line 7-5  9.  Acute blood loss anemia.  Follow-up CBC  - Stable 8; monitor  10.  Decreased nutritional storage.  TPN discontinued.  Continue regular diet  - Adequate POs; monitor, encourage protein supplementation for wound healing   11.  Obesity.  BMI 36.91.  Dietary follow-up 12.  Diabetes mellitus.  Hemoglobin A1c 8.5.  Currently on sliding scale insulin.   Patient on Glucophage 500 mg twice daily prior to admission.  Resume as needed  - Blood sugars well-controlled, monitor Recent Labs    11/15/22 2114 11/16/22 0727 11/16/22 1136  GLUCAP 98 125* 158*     13.  Hypertension.  Cozaar 25-50 mg daily and hydrochlorothiazide 6.25-12.5 mg daily.   -Mildly hypertensive, given significant debility would allow to run a little high to  prevent orthostasis; monitor     11/17/2022    8:02 AM 11/17/2022    5:53 AM 11/16/2022    8:30 PM  Vitals with BMI  Systolic 140 140 409  Diastolic 77 78  74  Pulse 87 85 92       LOS: 1 days A FACE TO FACE EVALUATION WAS PERFORMED  Kaitlyn Bray 11/17/2022, 9:10 AM

## 2022-11-17 NOTE — Consult Note (Signed)
WOC Nurse follow-up Consult Note: Reason for Consult: Vac dressing change performed. Pt was medicated for pain prior to the procedure and she tolerated with minimal amt discomfort.  Wound type: Full thickness midline post-op abd wound; refer to previous progress notes for measurements. Mesh visible to woundbed with undermining as previously noted.  Drainage (amount, consistency, odor) mod amt yellow drainage in the cannister Periwound: intact Dressing procedure/placement/frequency: Applied Mepitel contact layer to protect the wound bed, then 3 pieces of white foam, over the wound bed and tucked into undermining areas, then one piece of black foam to cont suction.  WOC team will plan to change the dressing again on Mon. Thank-you,  Cammie Mcgee MSN, RN, CWOCN, [place], CNS [phone]

## 2022-11-17 NOTE — Progress Notes (Signed)
Inpatient Rehabilitation Care Coordinator Assessment and Plan Patient Details  Name: Kaitlyn Bray MRN: [id number] Date of Birth: 1966-01-01  Today's Date: 11/17/2022  Hospital Problems: Principal Problem:   Debility  Past Medical History:  Past Medical History:  Diagnosis Date   Asthma    Complication of anesthesia    Heart murmur    Hernia    Hypertension    New onset type 2 diabetes mellitus (HCC) 09/15/2022   PONV (postoperative nausea and vomiting)    Pre-diabetes    S/P hernia repair 08/26/2021   SBO (small bowel obstruction) (HCC) 09/15/2021   Past Surgical History:  Past Surgical History:  Procedure Laterality Date   ABDOMINAL HYSTERECTOMY     one ovary stlil in place. removed uterus cervix and pt thinks fallopian tubes   APPENDECTOMY     APPLICATION OF WOUND VAC N/A 11/04/2022   Procedure: APPLICATION OF WOUND VAC;  Surgeon: [name], MD;  Location: [facility];  Service: General;  Laterality: N/A;   CESAREAN SECTION     DEBRIDEMENT AND CLOSURE WOUND N/A 11/04/2022   Procedure: ABSORBABLE MESH CLOSURE OF ABDOMEN;  Surgeon: [name], MD;  Location: [facility];  Service: General;  Laterality: N/A;   INCISIONAL HERNIA REPAIR N/A 08/26/2021   Procedure: INCISIONAL HERNIA REPAIR;  Surgeon: [name], MD;  Location: [facility] OR;  Service: General;  Laterality: N/A;   INSERTION OF MESH N/A 08/26/2021   Procedure: INSERTION OF MESH;  Surgeon: [name], MD;  Location: [facility] OR;  Service: General;  Laterality: N/A;   LAPAROTOMY N/A 07/29/2020   Procedure: EXPLORATORY LAPAROTOMY, REPAIR OF VENTRAL HERNIA;  Surgeon: [name], MD;  Location: [facility] OR;  Service: General;  Laterality: N/A;   LAPAROTOMY N/A 10/29/2022   Procedure: EXPLORATORY LAPAROTOMY WITH REPAIR OF SMALL BOWEL PERFORATION, AND PLACEMENT OF WOUND VAC;  Surgeon: Harriette Bouillon, MD;  Location: [facility];  Service: General;  Laterality: N/A;   LAPAROTOMY N/A 10/31/2022   Procedure: EXPLORATORY  LAPAROTOMY - WASH OUT OF ABDOMEN AND WOUND VAC PLACEMENT;  Surgeon: Abigail Miyamoto, MD;  Location: [facility];  Service: General;  Laterality: N/A;   LAPAROTOMY N/A 11/02/2022   Procedure: EXPLORATORY LAPAROTOMY Wound wash out of abdomen and wound vac change;  Surgeon: Abigail Miyamoto, MD;  Location: [facility];  Service: General;  Laterality: N/A;   MYOMECTOMY     XI ROBOTIC ASSISTED VENTRAL HERNIA N/A 10/23/2022   Procedure: ROBOTIC RECURRENT INCISIONAL HERNIA REPAIR WITH MESH WITH BILATERAL TRANVERSUS ABDOMINUS MYOFACIAL RELEASE AND BILATERAL POSTERIOR RECTUS MYOFACIAL RELEASE;  Surgeon: [name], MD;  Location: [facility];  Service: General;  Laterality: N/A;   Social History:  reports that she has never smoked. She has never used smokeless tobacco. She reports that she does not drink alcohol and does not use drugs.  Family / Support Systems Marital Status: Married How Long?: 25 years Patient Roles: Spouse, Parent Spouse/Significant Other: Fredrik Cove (husband) Children: Adult son Sharlet [place]- lives in the home Other Supports: cousin lives near their home Anticipated Caregiver: husband and son Ability/Limitations of Caregiver: husband works. 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 Rpeprots she does not have STD, and has not applied for FMLA Legal History/Current Legal Issues: Denies Guardian/Conservator: Denies   Abuse/Neglect Abuse/Neglect Assessment Can Be Completed: Yes Physical Abuse: Denies Verbal Abuse: Denies Sexual  Abuse: Denies Exploitation of patient/patient's resources: Denies Self-Neglect: Denies  Patient response to: Social Isolation - How often do you feel lonely or isolated from those around you?: Never  Emotional Status Pt's affect, behavior and adjustment status: Pt in good spirits at time of visit Recent Psychosocial Issues: Denies Psychiatric History: Denies Substance Abuse History: Denies  Patient / Family Perceptions, Expectations & Goals Pt/Family understanding of illness & functional limitations: Pt has general understanding of care needs Premorbid pt/family roles/activities: Independent Anticipated changes in roles/activities/participation: Assistance with ADLs/IADls Pt/family expectations/goals: Pt goal is to work on walking better and mobility.  Community Resources [employer]: None Premorbid Home Care/DME Agencies: None Transportation available at discharge: TBD Is the patient able to respond to transportation needs?: Yes In the past 12 months, has lack of transportation kept you from medical appointments or from getting medications?: No In the past 12 months, has lack of transportation kept you from meetings, work, or from getting things needed for daily living?: No Resource referrals recommended: Neuropsychology  Discharge Planning Living Arrangements: Spouse/significant other, Children Support Systems: Spouse/significant other, Children Type of Residence: Private residence Insurance Resources: Media planner (specify) Administrator [facility]) Financial Resources: Employment Financial Screen Referred: No Living Expenses: Own Money Management: Patient Does the patient have any problems obtaining your medications?: No Home Management: Both help manage home care needs Patient/Family Preliminary Plans: TBD Care Coordinator Barriers to Discharge: Decreased caregiver support, Lack of/limited family support, Insurance for SNF coverage, Wound Care Care Coordinator  Anticipated Follow Up Needs: HH/OP  Clinical Impression SW met with pt at bedside to introduce self, explain role, and discuss discharge process. Pt is not a Cytogeneticist. No HCPOA. No DME. Pt is aware that SW will begin tentative referral for wound vac and HHA (no agency preference).   [id number] left message for pt husband to introduce self, explain role, and discuss discharge process. SW shared will follow-up as updates are available.   SW sent out tentative referral for wound vac to [facility]. SW waiting on update from [facility] about referral for HHPT/OT/SN.   Cordaro Mukai A Jalila Goodnough 11/17/2022, 3:13 PM

## 2022-11-17 NOTE — Progress Notes (Signed)
Inpatient Rehabilitation  Patient information reviewed and entered into eRehab system by Jacorey Donaway M. Chrystal Zeimet, M.A., CCC/SLP, PPS Coordinator.  Information including medical coding, functional ability and quality indicators will be reviewed and updated through discharge.    

## 2022-11-17 NOTE — H&P (Signed)
Physical Medicine and Rehabilitation Admission H&P       HPI: Kaitlyn Bray is a 57 year old right-handed female with history obesity with BMI 36.91, hypertension, diabetes mellitus, asthma   Per chart review lives with spouse.  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  1 level home with ramped entrance.  Presented to [facility] 10/23/2022 for ventral hernia status post robotic incisional repair 10/23/2022.  Postoperative course complicated by fever and drainage of foul-smelling fluid from her lower abdominal incision site, bowel perforation, sepsis and peritonitis status post exploratory laparotomy 6/16 and washout on 6/18, 6/20 with closing of abdomen 6/22 and wound VAC remains in place.  She did require short-term intubation through 11/08/2022.  Cultures with group G streptococci/actinomyces,bacteroides ovatus beta lactamase positive and Candida albicans.  Placed on broad-spectrum antibiotics.  CT of the abdomen pelvis completed to rule out any further abscesses showed a loculated fluid collection in the anterior lower abdomen that radiology felt could be due to developing sepsis that was reviewed by general surgery and felt that collection of fluids seen on CT were fairly expected findings and not likely to be infected.  Patient did have JP drains in place that is since been removed.  Antibiotic has been transition to Augmentin 875-125 mg every 12 hours as well as Diflucan per infectious disease and awaiting plan by infectious disease on duration of antibiotic.Marland Kitchen  Lovenox was added for DVT prophylaxis.  Hospital course acute blood loss anemia 8.2 and monitored.  Diet has been advanced to regular after initially being on TPN for nutritional support.  Therapy evaluations completed due to patient decreased functional mobility was admitted for a comprehensive rehab program.   Review of Systems  Constitutional:  Positive for fever and malaise/fatigue.  HENT:  Negative for hearing  loss.   Eyes:  Negative for blurred vision and double vision.  Respiratory:  Negative for cough, shortness of breath and wheezing.   Cardiovascular:  Positive for leg swelling. Negative for chest pain and palpitations.  Gastrointestinal:  Positive for abdominal pain, constipation and nausea. Negative for heartburn and vomiting.  Genitourinary:  Negative for dysuria, flank pain and hematuria.  Musculoskeletal:  Positive for joint pain and myalgias.  Skin:  Negative for rash.  All other systems reviewed and are negative.       Past Medical History:  Diagnosis Date   Asthma     Complication of anesthesia     Heart murmur     Hernia     Hypertension     New onset type 2 diabetes mellitus (HCC) 09/15/2022   PONV (postoperative nausea and vomiting)     Pre-diabetes     S/P hernia repair 08/26/2021   SBO (small bowel obstruction) (HCC) 09/15/2021         Past Surgical History:  Procedure Laterality Date   ABDOMINAL HYSTERECTOMY        one ovary stlil in place. removed uterus cervix and pt thinks fallopian tubes   APPENDECTOMY       APPLICATION OF WOUND VAC N/A 11/04/2022    Procedure: APPLICATION OF WOUND VAC;  Surgeon: [name], MD;  Location: [facility];  Service: General;  Laterality: N/A;   CESAREAN SECTION       DEBRIDEMENT AND CLOSURE WOUND N/A 11/04/2022    Procedure: ABSORBABLE MESH CLOSURE OF ABDOMEN;  Surgeon: [name], MD;  Location: [facility];  Service: General;  Laterality: N/A;   INCISIONAL HERNIA REPAIR N/A 08/26/2021  Procedure: INCISIONAL HERNIA REPAIR;  Surgeon: [name], MD;  Location: [facility] OR;  Service: General;  Laterality: N/A;   INSERTION OF MESH N/A 08/26/2021    Procedure: INSERTION OF MESH;  Surgeon: [name], MD;  Location: [facility] OR;  Service: General;  Laterality: N/A;   LAPAROTOMY N/A 07/29/2020    Procedure: EXPLORATORY LAPAROTOMY, REPAIR OF VENTRAL HERNIA;  Surgeon: [name], MD;  Location: [facility] OR;  Service: General;   Laterality: N/A;   LAPAROTOMY N/A 10/29/2022    Procedure: EXPLORATORY LAPAROTOMY WITH REPAIR OF SMALL BOWEL PERFORATION, AND PLACEMENT OF WOUND VAC;  Surgeon: Harriette Bouillon, MD;  Location: [facility];  Service: General;  Laterality: N/A;   LAPAROTOMY N/A 10/31/2022    Procedure: EXPLORATORY LAPAROTOMY - WASH OUT OF ABDOMEN AND WOUND VAC PLACEMENT;  Surgeon: Abigail Miyamoto, MD;  Location: [facility];  Service: General;  Laterality: N/A;   LAPAROTOMY N/A 11/02/2022    Procedure: EXPLORATORY LAPAROTOMY Wound wash out of abdomen and wound vac change;  Surgeon: Abigail Miyamoto, MD;  Location: [facility];  Service: General;  Laterality: N/A;   MYOMECTOMY       XI ROBOTIC ASSISTED VENTRAL HERNIA N/A 10/23/2022    Procedure: ROBOTIC RECURRENT INCISIONAL HERNIA REPAIR WITH MESH WITH BILATERAL TRANVERSUS ABDOMINUS MYOFACIAL RELEASE AND BILATERAL POSTERIOR RECTUS MYOFACIAL RELEASE;  Surgeon: [name], MD;  Location: [facility];  Service: General;  Laterality: N/A;         Family History  Problem Relation Age of Onset   Hypertension Mother     Stroke Mother     Dementia Mother     Heart disease Mother     Hyperlipidemia Mother     COPD Father     Rheum arthritis Father     Mental illness Father     Heart disease Maternal Grandmother     Heart disease Maternal Grandfather     Alzheimer's disease Maternal Grandfather     Uterine cancer Paternal Grandmother      Social History:  reports that she has never smoked. She has never used smokeless tobacco. She reports that she does not drink alcohol and does not use drugs. Allergies:       Allergies  Allergen Reactions   Codeine Other (See Comments)      Fast heart rate   Latex Other (See Comments)      Redness from bandaids   Sulfa Drugs Cross Reactors Rash          Medications Prior to Admission  Medication Sig Dispense Refill   Ascorbic Acid (VITAMIN C PO) Take 1 capsule by mouth daily.       Cyanocobalamin (B-12 PO) Take 1 tablet by mouth  daily.       ibuprofen (ADVIL) 200 MG tablet Take 200 mg by mouth every 8 (eight) hours as needed for headache or moderate pain.       losartan-hydrochlorothiazide (HYZAAR) 50-12.5 MG tablet TAKE 1 TABLET BY MOUTH EVERY DAY 90 tablet 3   metFORMIN (GLUCOPHAGE-XR) 500 MG 24 hr tablet Take 1 tablet (500 mg total) by mouth 2 (two) times daily. Take two tablets twice daily (Patient taking differently: Take 1,000 mg by mouth 2 (two) times daily with a meal.) 360 tablet 3   Multiple Vitamins-Minerals (ZINC PO) Take 1 tablet by mouth daily.       VITAMIN D PO Take 1 capsule by mouth daily.       albuterol (VENTOLIN HFA) 108 (90 Base) MCG/ACT inhaler Inhale 2 puffs  into the lungs every 6 (six) hours as needed for wheezing or shortness of breath.       glucose blood test strip Use as instructed 100 each 12   meclizine (ANTIVERT) 25 MG tablet Take 25 mg by mouth 3 (three) times daily as needed for dizziness.              Home: Home Living Family/patient expects to be discharged to:: Private residence Living Arrangements: Spouse/significant other Available Help at Discharge: Family, Friend(s), Available 24 hours/day Type of Home: House Home Access: Ramped entrance Home Layout: One level Bathroom Shower/Tub: Engineer, manufacturing systems: Handicapped height Bathroom Accessibility: Yes Home Equipment: Rollator (4 wheels), Shower seat  Lives With: Spouse   Functional History: Prior Function Prior Level of Function : Independent/Modified Independent, Driving, Working/employed   Functional Status:  Mobility: Bed Mobility Overal bed mobility: Needs Assistance Bed Mobility: Supine to Sit Rolling: Min assist Sidelying to sit: Min assist, HOB elevated (increased time) General bed mobility comments: cues for log roll technique to maintain abdominal precuations Transfers Overall transfer level: Needs assistance Equipment used:  (STEADY) Transfers: Sit to/from Stand, Bed to  chair/wheelchair/BSC Sit to Stand: +2 safety/equipment, From elevated surface, Min assist Bed to/from chair/wheelchair/BSC transfer type:: Step pivot Step pivot transfers: Mod assist General transfer comment: pt able to pull to stand from EOB on STEADY lift with min A x 1 and min guard x 1 for safety, pt demonstrated ability to maintain static standing on lift for 2;47 with close S and min cues. pt tolerated transfer well to recliner and able to follow commands for safe and proper technique. PT provided nursing ed on use of STEADY lift to return to bed Ambulation/Gait General Gait Details: NT   ADL: ADL Overall ADL's : Needs assistance/impaired Eating/Feeding: NPO Grooming: Wash/dry hands, Wash/dry face, Oral care, Brushing hair, Set up, Supervision/safety, Sitting Upper Body Bathing: Minimal assistance, Sitting Lower Body Bathing: Maximal assistance, Sit to/from stand Upper Body Dressing : Minimal assistance, Sitting Lower Body Dressing: Total assistance Toilet Transfer: Moderate assistance, +2 for safety/equipment, Stand-pivot Toilet Transfer Details (indicate cue type and reason): simulated to recliner Toileting- Clothing Manipulation and Hygiene: Maximal assistance, Sit to/from stand Toileting - Clothing Manipulation Details (indicate cue type and reason): incontinenet BM Functional mobility during ADLs: Moderate assistance, +2 for physical assistance, Rolling walker (2 wheels) General ADL Comments: no BSC in present this session, requested BSC from nursing staff and infomred RN that pt can be assisted to [facility] with SPT mod A   Cognition: Cognition Overall Cognitive Status: Within Functional Limits for tasks assessed Orientation Level: Oriented X4 Cognition Arousal/Alertness: Awake/alert Behavior During Therapy: WFL for tasks assessed/performed, Anxious Overall Cognitive Status: Within Functional Limits for tasks assessed Area of Impairment: Attention, Safety/judgement,  Awareness Current Attention Level: Selective Awareness: Emergent General Comments: slow processing   Physical Exam: Blood pressure 129/74, pulse 85, temperature 99.1 F (37.3 C), temperature source Oral, resp. rate 18, height 5\' 5"  (1.651 m), weight 100.6 kg, SpO2 99 %. Physical Exam Abdominal:     Comments: Abdominal wound with wound VAC in place.  Neurological:     Comments: Patient is alert and oriented x 3 following commands.      General: No acute distress Mood and affect are appropriate Heart: Regular rate and rhythm no rubs murmurs or extra sounds Lungs: Clear to auscultation, breathing unlabored, no rales or wheezes Abdomen: Positive bowel sounds, nontender to palpation, distended, mildline wound vac Extremities: No clubbing, cyanosis, or edema Skin: No  evidence of breakdown, no evidence of rash, no drainage from former JP sites Neurologic: , motor strength is 4-/5 in bilateral deltoid, bicep, tricep, grip, hip flexor, knee extensors, ankle dorsiflexor and plantar flexor Sensory exam normal sensation to light touch and proprioception in bilateral upper mildly diminished lower extremities Cerebellar exam normal finger to nose to finger Musculoskeletal: Full range of motion in all 4 extremities. No joint swelling    Lab Results Last 48 Hours        Results for orders placed or performed during the hospital encounter of 10/23/22 (from the past 48 hour(s))  Glucose, capillary     Status: Abnormal    Collection Time: 11/14/22  7:31 AM  Result Value Ref Range    Glucose-Capillary 112 (H) 70 - 99 mg/dL      Comment: Glucose reference range applies only to samples taken after fasting for at least 8 hours.  Glucose, capillary     Status: Abnormal    Collection Time: 11/14/22 11:52 AM  Result Value Ref Range    Glucose-Capillary 127 (H) 70 - 99 mg/dL      Comment: Glucose reference range applies only to samples taken after fasting for at least 8 hours.  Glucose, capillary      Status: Abnormal    Collection Time: 11/14/22  3:48 PM  Result Value Ref Range    Glucose-Capillary 101 (H) 70 - 99 mg/dL      Comment: Glucose reference range applies only to samples taken after fasting for at least 8 hours.  Glucose, capillary     Status: Abnormal    Collection Time: 11/14/22  9:08 PM  Result Value Ref Range    Glucose-Capillary 118 (H) 70 - 99 mg/dL      Comment: Glucose reference range applies only to samples taken after fasting for at least 8 hours.  Glucose, capillary     Status: Abnormal    Collection Time: 11/15/22 12:06 AM  Result Value Ref Range    Glucose-Capillary 116 (H) 70 - 99 mg/dL      Comment: Glucose reference range applies only to samples taken after fasting for at least 8 hours.  Basic metabolic panel     Status: Abnormal    Collection Time: 11/15/22  3:39 AM  Result Value Ref Range    Sodium 136 135 - 145 mmol/L    Potassium 3.9 3.5 - 5.1 mmol/L    Chloride 100 98 - 111 mmol/L    CO2 25 22 - 32 mmol/L    Glucose, Bld 111 (H) 70 - 99 mg/dL      Comment: Glucose reference range applies only to samples taken after fasting for at least 8 hours.    BUN 17 6 - 20 mg/dL    Creatinine, Ser 4.09 0.44 - 1.00 mg/dL    Calcium 8.4 (L) 8.9 - 10.3 mg/dL    GFR, Estimated >81 >19 mL/min      Comment: (NOTE) Calculated using the CKD-EPI Creatinine Equation (2021)      Anion gap 11 5 - 15      Comment: Performed at [facility], [address] [place], [place] [address]  CBC     Status: Abnormal    Collection Time: 11/15/22  3:39 AM  Result Value Ref Range    WBC 11.9 (H) 4.0 - 10.5 K/uL    RBC 2.94 (L) 3.87 - 5.11 MIL/uL    Hemoglobin 8.1 (L) 12.0 - 15.0 g/dL    HCT  27.0 (L) 36.0 - 46.0 %    MCV 91.8 80.0 - 100.0 fL    MCH 27.6 26.0 - 34.0 pg    MCHC 30.0 30.0 - 36.0 g/dL    RDW 16.1 (H) 09.6 - 15.5 %    Platelets 414 (H) 150 - 400 K/uL    nRBC 0.0 0.0 - 0.2 %      Comment: Performed at [facility], [address] [place], [place] [address]  Glucose, capillary     Status: Abnormal    Collection Time: 11/15/22  3:51 AM  Result Value Ref Range    Glucose-Capillary 109 (H) 70 - 99 mg/dL      Comment: Glucose reference range applies only to samples taken after fasting for at least 8 hours.  Glucose, capillary     Status: Abnormal    Collection Time: 11/15/22  7:19 AM  Result Value Ref Range    Glucose-Capillary 104 (H) 70 - 99 mg/dL      Comment: Glucose reference range applies only to samples taken after fasting for at least 8 hours.  Glucose, capillary     Status: Abnormal    Collection Time: 11/15/22 11:51 AM  Result Value Ref Range    Glucose-Capillary 107 (H) 70 - 99 mg/dL      Comment: Glucose reference range applies only to samples taken after fasting for at least 8 hours.  Glucose, capillary     Status: Abnormal    Collection Time: 11/15/22  4:36 PM  Result Value Ref Range    Glucose-Capillary 145 (H) 70 - 99 mg/dL      Comment: Glucose reference range applies only to samples taken after fasting for at least 8 hours.  Glucose, capillary     Status: None    Collection Time: 11/15/22  9:14 PM  Result Value Ref Range    Glucose-Capillary 98 70 - 99 mg/dL      Comment: Glucose reference range applies only to samples taken after fasting for at least 8 hours.  Basic metabolic panel     Status: Abnormal    Collection Time: 11/16/22  2:35 AM  Result Value Ref Range    Sodium 136 135 - 145 mmol/L    Potassium 3.6 3.5 - 5.1 mmol/L    Chloride 98 98 - 111 mmol/L    CO2 27 22 - 32 mmol/L    Glucose, Bld 110 (H) 70 - 99 mg/dL      Comment: Glucose reference range applies only to samples taken after fasting for at least 8 hours.    BUN 15 6 - 20 mg/dL    Creatinine, Ser 9.81 0.44 - 1.00 mg/dL    Calcium 8.2 (L) 8.9 - 10.3 mg/dL    GFR, Estimated >19 >14 mL/min      Comment: (NOTE) Calculated using the CKD-EPI Creatinine Equation (2021)      Anion gap 11 5 - 15      Comment: Performed  at [facility], [address], [place], [place] [address]  CBC     Status: Abnormal    Collection Time: 11/16/22  2:35 AM  Result Value Ref Range    WBC 11.3 (H) 4.0 - 10.5 K/uL    RBC 3.02 (L) 3.87 - 5.11 MIL/uL    Hemoglobin 8.2 (L) 12.0 - 15.0 g/dL    HCT 62.1 (L) 30.8 - 46.0 %    MCV 92.1 80.0 - 100.0 fL    MCH 27.2  26.0 - 34.0 pg    MCHC 29.5 (L) 30.0 - 36.0 g/dL    RDW 16.1 (H) 09.6 - 15.5 %    Platelets 386 150 - 400 K/uL    nRBC 0.0 0.0 - 0.2 %      Comment: Performed at [facility], [address] [place], [place] [address]       Imaging Results (Last 48 hours)  CT ABDOMEN PELVIS W CONTRAST   Result Date: 11/14/2022 CLINICAL DATA:  Small bowel perforation and intra-abdominal abscess. EXAM: CT ABDOMEN AND PELVIS WITH CONTRAST TECHNIQUE: Multidetector CT imaging of the abdomen and pelvis was performed using the standard protocol following bolus administration of intravenous contrast. RADIATION DOSE REDUCTION: This exam was performed according to the departmental dose-optimization program which includes automated exposure control, adjustment of the mA and/or kV according to patient size and/or use of iterative reconstruction technique. CONTRAST:  OMNIPAQUE IOHEXOL 300 MG/ML  SOLN COMPARISON:  CT abdomen pelvis dated 07/26/2022. FINDINGS: Lower chest: The visualized lung bases are clear. No intra-abdominal free air. Hepatobiliary: Fatty infiltration of the liver. No biliary dilatation. The gallbladder is unremarkable. Pancreas: Unremarkable. No pancreatic ductal dilatation or surrounding inflammatory changes. Spleen: Normal in size without focal abnormality. Adrenals/Urinary Tract: The adrenal glands unremarkable. There is no hydronephrosis on either side. There is symmetric enhancement and excretion of contrast by both kidneys. The visualized ureters and urinary bladder appear unremarkable small focus of air within the bladder, likely  introduced via recent catheterization. Stomach/Bowel: There is sigmoid diverticulosis without active inflammatory changes. There is no bowel obstruction or active inflammation. No CT evidence of acute appendicitis. Vascular/Lymphatic: Mild atherosclerotic calcification of the abdominal aorta. The IVC is unremarkable. No portal venous gas. There is no adenopathy. Reproductive: Hysterectomy.  No adnexal masses. Other: There is diffuse mesenteric edema as well as omental nodularity. There is a 5.8 x 4.6 cm loculated appearing fluid in the left anterior lower abdomen concerning for an infected fluid or developing abscess. This collection may communicate with the anterior pelvic wall open wound. Additional smaller loculated fluid within the mesentery in the anterior lower abdomen, one abutting the small bowel and the other abutting the medial wall of the ascending colon. Two drainage catheters with tips terminating in the left hemipelvis. No fluid collection noted along the course or adjacent to the tips of the drainage catheters. Musculoskeletal: Anterior pelvic wall open wound and wound VAC. No acute osseous pathology. IMPRESSION: 1. Loculated appearing fluid in the left anterior lower abdomen concerning for an infected fluid or developing abscess. This collection may communicate with the anterior pelvic wall open wound. Smaller loculated collections in the lower abdomen. 2. Two drainage catheters with tips terminating in the left hemipelvis. No fluid collection noted along the course or adjacent to the tips of the drainage catheters. 3. Sigmoid diverticulosis. No bowel obstruction. 4. Fatty liver. 5.  Aortic Atherosclerosis (ICD10-I70.0). Electronically Signed   By: Elgie Collard M.D.   On: 11/14/2022 18:34           Blood pressure 129/74, pulse 85, temperature 99.1 F (37.3 C), temperature source Oral, resp. rate 18, height 5\' 5"  (1.651 m), weight 100.6 kg, SpO2 99 %.   Medical Problem List and Plan: 1.  Functional deficits secondary to debility secondary to ventral hernia repair 10/23/2022 complicated by bowel perforation, sepsis and peritonitis status post exploratory laparotomy 6/16 and washout on 618, 6/20 with closing of abdomen 6/22.  Continue wound VAC per general surgery             -  patient may not shower             -ELOS/Goals: 14-18d 2.  Antithrombotics: -DVT/anticoagulation:  Pharmaceutical: Lovenox             -antiplatelet therapy: N/A 3. Pain Management: Neurontin 400 mg 3 times daily, oxycodone as needed 4. Mood/Behavior/Sleep: Provide emotional support             -antipsychotic agents: N/A 5. Neuropsych/cognition: This patient is capable of making decisions on her own behalf. 6. Skin/Wound Care: Routine skin checks 7. Fluids/Electrolytes/Nutrition: Routine in and outs with follow-up chemistries 8.  ID.  Continue Augmentin/Diflucan as per Dr. Algis Liming of infectious disease.  Await plan by infectious disease on duration of antibiotic 9.  Acute blood loss anemia.  Follow-up CBC 10.  Decreased nutritional storage.  TPN discontinued.  Continue regular diet 11.  Obesity.  BMI 36.91.  Dietary follow-up 12.  Diabetes mellitus.  Hemoglobin A1c 8.5.  Currently on sliding scale insulin.   Patient on Glucophage 500 mg twice daily prior to admission.  Resume as needed 13.  Hypertension.  Cozaar 25-50 mg daily and hydrochlorothiazide 6.25-12.5 mg daily.         Mcarthur Rossetti Angiulli, PA-C 11/16/2022  "I have personally performed a face to face diagnostic evaluation of this patient.  Additionally, I have reviewed and concur with the physician assistant's documentation above." Erick Colace M.D. [facility] Fellow Am Acad of Phys Med and Rehab Diplomate Am Board of Electrodiagnostic Med Fellow Am Board of Interventional Pain

## 2022-11-17 NOTE — Discharge Instructions (Addendum)
Inpatient Rehab Discharge Instructions  AMADI YOSHINO Discharge date and time: No discharge date for patient encounter.   Activities/Precautions/ Functional Status: Activity: activity as tolerated Diet: regular diet Wound Care: Routine skin checks Functional status:  ___ No restrictions     ___ Walk up steps independently ___ 24/7 supervision/assistance   ___ Walk up steps with assistance ___ Intermittent supervision/assistance  ___ Bathe/dress independently ___ Walk with walker     _x__ Bathe/dress with assistance ___ Walk Independently    ___ Shower independently ___ Walk with assistance    ___ Shower with assistance ___ No alcohol     ___ Return to work/school ________  Special Instructions: No driving smoking or alcohol  Routine wound VAC changes as directed    COMMUNITY REFERRALS UPON DISCHARGE:    Home Health:   PT     OT        RN                Agency:[facility]         Phone:[phone] *Please expect follow-up to schedule your home visit. If you have not received follow-up, be sure to contact the branch directly.*    Medical Equipment/Items Ordered:wound vac                                                 Agency/SupplierChristoper Allegra #[phone]  Medical Equipment/Items Ordered:rollator                                                 Agency/Supplier: [employer] [phone]    My questions have been answered and I understand these instructions. I will adhere to these goals and the provided educational materials after my discharge from the hospital.  Patient/Caregiver Signature _______________________________ Date __________  Clinician Signature _______________________________________ Date __________  Please bring this form and your medication list with you to all your follow-up doctor's appointments.

## 2022-11-18 DIAGNOSIS — R739 Hyperglycemia, unspecified: Secondary | ICD-10-CM

## 2022-11-18 DIAGNOSIS — I10 Essential (primary) hypertension: Secondary | ICD-10-CM

## 2022-11-18 NOTE — Progress Notes (Signed)
Occupational Therapy Session Note  Patient Details  Name: Kaitlyn Bray MRN: [id number] Date of Birth: 22-Jun-1965  Today's Date: 11/18/2022 OT Individual Time: 1000-1110 OT Individual Time Calculation (min): 70 min    Short Term Goals: Week 1:  OT Short Term Goal 1 (Week 1): Patient to perform toileting with SBA OT Short Term Goal 2 (Week 1): Patient to perform LE dressing with CGA and use of AE PRN OT Short Term Goal 3 (Week 1): Patient to perform bathing Min assist with AE PRN OT Short Term Goal 4 (Week 1): Patient to tolerate stading for grooming tasks CGA x 3 minutes.  Skilled Therapeutic Interventions/Progress Updates: Patient complained of fatigue from n & v but concurred to particpate in OT services and overall focus was on endurance, balance for functional balance and transfers as well as self care.   She participated as follows:  grooming seated at sink with encouragement to turn on and off the water faucets. UB bathing and dressing in w/c at sink = setup of over head pull on dress.  LB bathing consisted simply of washing periarea in standing at sink.   She was able to wash and dry front periarea  as she held onto sink with one hand and washed body with the other for about 5 minutes per try before complaining of fatigue.  She requried Min A assistance from OT practitioner wash reach buttock crease for thoroughness.  She elected not to wash her feet.     She was left seated in her recliner at end of session with call bell and phone within reach.  Continue OT POC     Therapy Documentation Precautions:  Precautions Precautions: Fall Precaution Comments: Abdominal wound vac, abdominal precautions Restrictions Weight Bearing Restrictions: No  Pain:denied      Therapy/Group: Individual Therapy  Bud Face [facility] 11/18/2022, 3:05 PM

## 2022-11-18 NOTE — Progress Notes (Signed)
Physical Therapy Session Note  Patient Details  Name: Kaitlyn Bray MRN: [id number] Date of Birth: 11/19/1965  Today's Date: 11/18/2022 PT Individual Time: 0800-0840 PT Individual Time Calculation (min): 40 min   Short Term Goals: Week 1:  PT Short Term Goal 1 (Week 1): STGs=LTGs secondary to ELOS  Skilled Therapeutic Interventions/Progress Updates:    Chart reviewed and pt agreeable to therapy. Pt received seated in WC with 5/10 c/o pain in abdomin. Also, pt received taking medications from RN. Session focused on functional mobility and amb quality to promote safe home access. Pt initiated session with CGA + RW for sit to stand and pre-gait marching. Pt then reported strong urge for BM. Pt amb to toilet using CGA + RW + strong VC for safety with line management. Pt required significant time for toileting and MaxA for pericare. Pt reported high pain and fatigue. Pt completed 2x10 marches in place with CGA + RW. At end of session, pt was left WC with alarm engaged, nurse call bell and all needs in reach.     Therapy Documentation Precautions:  Precautions Precautions: Fall Precaution Comments: Abdominal wound vac, abdominal precautions Restrictions Weight Bearing Restrictions: No General:      Therapy/Group: Individual Therapy  Dionne Milo 11/18/2022, 12:06 PM

## 2022-11-18 NOTE — Progress Notes (Signed)
Occupational Therapy Session Note  Patient Details  Name: Kaitlyn Bray MRN: [id number] Date of Birth: [birth year]  Today's Date: 11/18/2022 OT Individual Time: 8295-6213 OT Individual Time Calculation (min): 71 min    Short Term Goals: Week 1:  OT Short Term Goal 1 (Week 1): Patient to perform toileting with SBA OT Short Term Goal 2 (Week 1): Patient to perform LE dressing with CGA and use of AE PRN OT Short Term Goal 3 (Week 1): Patient to perform bathing Min assist with AE PRN OT Short Term Goal 4 (Week 1): Patient to tolerate stading for grooming tasks CGA x 3 minutes.  Skilled Therapeutic Interventions/Progress Updates:     Pt received sitting up in recliner with RN present in room providing medications. Pt presenting to be in good spirits receptive to skilled OT session reporting 0/10 pain- OT offering intermittent rest breaks, repositioning, and therapeutic support to optimize participation in therapy session. Pt dressed and ready for the day upon OT arrival. Donned socks and shoes total A for time management.   Pt requesting to use restroom at beginning of session. Sit>stand min A using RW with min verbal cues for technique. Pt completed functional mobility to bathroom using RW CGA. Pt able to doff/donn pants in standing and complete anterior peri care while seated on toilet with CGA. Sit>stand from toilet sing grab bars min A. Pt completed functional mobility to sink and stood to wash hands using RW CGA.   Engaged pt in completing grooming/hygiene tasks (washing face and brushing teeth) in standing to work on dynamic standing balance and activity tolerance. Pt able to maintain standing balance ~6 minutes during tasks with CGA using singe UE support on RW.   Pt transported total A to therapy gym in wc for time management and energy conservation. Sit>stand min A with OT providing mod verbal cues for technique and motivation with Pt reporting "my legs are too weak, I cannot do it". Pt  completed functional mobility ~62ft wc>EOM using RW with CGA for balance.   Engaged Pt in completing dynamic standing balance activity with posterior reaching incorporated into tasks to simulate toileting and clothing management during BADLs. Pt instructed to complete sit>stand and maintain standing balance while reaching posteriorly to retrieve bean bags throwing bean bags at target. Pt able to complete 3 trials using RW for balance during first trial and completing task without RW during second two trials. Pt able to maintain balance with CGA, correcting balance following anterior weight shifting during throw with CGA.   Engaged pt in simulated cleaning task in standing without RW to simulate IADL and work related activities of cleaning. Pt instructed to stand at elevated table while cleaning therapy supplies using BUEs during activity. Pt able to maintain standing balance with CGA ~3 minutes x3 trials with rest breaks provided between trials.   Pt transported back to room total A in wc for energy conservation. Pt was left resting in wc with call bell in reach, seat belt alarm on, and all needs met.    Therapy Documentation Precautions:  Precautions Precautions: Fall Precaution Comments: Abdominal wound vac, abdominal precautions Restrictions Weight Bearing Restrictions: No   Therapy/Group: Individual Therapy  Army Fossa 11/18/2022, 8:00 AM

## 2022-11-18 NOTE — Progress Notes (Signed)
Physical Therapy Session Note  Patient Details  Name: Kaitlyn Bray MRN: [id number] Date of Birth: 03-25-1966  Today's Date: 11/18/2022 PT Individual Time: 1540-1621 PT Individual Time Calculation (min): 41 min   Short Term Goals: Week 1:  PT Short Term Goal 1 (Week 1): STGs=LTGs secondary to ELOS  Skilled Therapeutic Interventions/Progress Updates:    Pt received sitting in w/c and reports she is fatigued, but is eager to participate in therapy session to the best of her ability.  Transported to gym in w/c for time management and energy conservation.  Sit<>stands using RW with CGA for steadying/safety throughout session.   Gait training 260ft using RW with CGA for safety and therapist bringing w/c follow to allow increased distance in event of fatigue. Pt demonstrating the following gait deviations with therapist providing the described cuing and facilitation for improvement:  - demos decreased gait speed with narrow BOS  - minor reliance on B UE support through AD to maintain upright stability  - minor decreased step lengths but achieves short, reciprocal pattern  - downward gaze  Stair navigation training ascending/descending 3 steps (6" height) using B HRs with min assist - goes up forward step-to pattern leading with L LE and and descends backwards step-to pattern leading with R LE. For LE strengthening, performed repeated R LE step up/down on/off 1st 6" step using B HR support x3 reps with min assist and pt having increased challenge powering up through R LE compared to L LE. Reports a little muscle pulling in RLE hamstrings with this exercise so discontinued.  Gait training ~272ft back to her room using RW with CGA for safety and therapist continuing to bring w/c follow in event of fatigue - cuing for wider BOS and upright posture.   Repeated sit<>stands recliner<>RW x5 reps targeting B LE functional strengthening with CGA/SBA for safety - demos good technique and uses B UE support  pushing up from armrest to initiate coming to stand from this lower surface.   At end of session, pt left seated in recliner with needs in reach and chair pad alarm on.  Therapy Documentation Precautions:  Precautions Precautions: Fall Precaution Comments: Abdominal wound vac, abdominal precautions Restrictions Weight Bearing Restrictions: No   Pain:  Reports onset of R hamstring muscle soreness after step-up strengthening exercise so discontinued at this time.     Therapy/Group: Individual Therapy  Ginny Forth , PT, DPT, NCS, CSRS 11/18/2022, 3:28 PM

## 2022-11-18 NOTE — Progress Notes (Signed)
PROGRESS NOTE   Subjective/Complaints:  Doing alright today, having some abd discomfort which she's had before, and seems to be improving now. Slept ok. Denies any other areas of pain. LBM was today. Urinating fine. Denies any other complaints or concerns.   ROS: Denies fevers, chills, N/V, constipation, diarrhea, SOB, cough, chest pain, new weakness or paraesthesias.    Objective:   No results found. Recent Labs    11/16/22 0235 11/16/22 1700  WBC 11.3* 12.0*  HGB 8.2* 8.3*  HCT 27.8* 27.6*  PLT 386 386   Recent Labs    11/16/22 0235 11/16/22 1700  NA 136 135  K 3.6 3.4*  CL 98 97*  CO2 27 26  GLUCOSE 110* 103*  BUN 15 14  CREATININE 0.52 0.55  CALCIUM 8.2* 8.4*    Intake/Output Summary (Last 24 hours) at 11/18/2022 1120 Last data filed at 11/18/2022 0732 Gross per 24 hour  Intake 477 ml  Output 300 ml  Net 177 ml        Physical Exam: Vital Signs Blood pressure 127/72, pulse 80, temperature 98.1 F (36.7 C), resp. rate 20, height 5\' 5"  (1.651 m), weight 99.9 kg, SpO2 97 %.  General: No acute distress.  Sitting in w/c Mood and affect are appropriate Heart: Regular rate and rhythm no rubs murmurs or extra sounds Lungs: Clear to auscultation, breathing unlabored, no rales or wheezes Abdomen: Positive bowel sounds although perhaps slightly hypoactive, with mild abd TTP diffusely but no r/g/r, distended, mildline wound vac with excellent seal and serous output. Extremities: No clubbing, cyanosis, or edema Skin: No evidence of breakdown, no evidence of rash, no drainage from former JP sites, wound vac on abd  PRIOR EXAMS: Neurologic: Awake, alert, and oriented x 3.  Appropriate cognition., motor strength is 5-/5 in bilateral deltoid, bicep, tricep, grip, hip flexor, knee extensors, ankle dorsiflexor and plantar flexor Sensory exam normal sensation to light touch and proprioception in bilateral upper mildly  diminished lower extremities Cerebellar exam normal finger to nose to finger Musculoskeletal: Full range of motion in all 4 extremities. No joint swelling    Assessment/Plan: 1. Functional deficits which require 3+ hours per day of interdisciplinary therapy in a comprehensive inpatient rehab setting. Physiatrist is providing close team supervision and 24 hour management of active medical problems listed below. Physiatrist and rehab team continue to assess barriers to discharge/monitor patient progress toward functional and medical goals  Care Tool:  Bathing    Body parts bathed by patient: Right arm, Left arm, Chest, Abdomen, Right upper leg, Left upper leg, Face   Body parts bathed by helper: Front perineal area, Buttocks, Left lower leg, Right lower leg     Bathing assist Assist Level: Moderate Assistance - Patient 50 - 74%     Upper Body Dressing/Undressing Upper body dressing   What is the patient wearing?: Pull over shirt    Upper body assist Assist Level: Contact Guard/Touching assist    Lower Body Dressing/Undressing Lower body dressing      What is the patient wearing?: Pants, Underwear/pull up     Lower body assist Assist for lower body dressing: Maximal Assistance - Patient 25 - 49%  Toileting Toileting    Toileting assist Assist for toileting: Moderate Assistance - Patient 50 - 74%     Transfers Chair/bed transfer  Transfers assist  Chair/bed transfer activity did not occur: Safety/medical concerns  Chair/bed transfer assist level: Minimal Assistance - Patient > 75%     Locomotion Ambulation   Ambulation assist      Assist level: Minimal Assistance - Patient > 75% Assistive device: Walker-rolling Max distance: 180   Walk 10 feet activity   Assist     Assist level: Minimal Assistance - Patient > 75% Assistive device: Walker-rolling   Walk 50 feet activity   Assist    Assist level: Minimal Assistance - Patient > 75% Assistive  device: Walker-rolling    Walk 150 feet activity   Assist    Assist level: Minimal Assistance - Patient > 75% Assistive device: Walker-rolling    Walk 10 feet on uneven surface  activity   Assist     Assist level: Minimal Assistance - Patient > 75% Assistive device: Walker-rolling   Wheelchair     Assist Is the patient using a wheelchair?: No             Wheelchair 50 feet with 2 turns activity    Assist            Wheelchair 150 feet activity     Assist          Blood pressure 127/72, pulse 80, temperature 98.1 F (36.7 C), resp. rate 20, height 5\' 5"  (1.651 m), weight 99.9 kg, SpO2 97 %.    Medical Problem List and Plan: 1. Functional deficits secondary to debility secondary to ventral hernia repair 10/23/2022 complicated by bowel perforation, sepsis and peritonitis status post exploratory laparotomy 6/16 and washout on 618, 6/20 with closing of abdomen 6/22.  Continue wound VAC per general surgery             -patient may not shower             -ELOS/Goals: 14-18d  -continue CIR  2.  Antithrombotics: -DVT/anticoagulation:  Pharmaceutical: Lovenox 40mg  QD             -antiplatelet therapy: N/A 3. Pain Management: Neurontin 400 mg 3 times daily, oxycodone 5mg  q4h as needed  -11/18/22 Pain well-controlled on current regimen, monitor 4. Mood/Behavior/Sleep: Provide emotional support             -antipsychotic agents: N/A 5. Neuropsych/cognition: This patient is capable of making decisions on her own behalf. 6. Skin/Wound Care: Routine skin checks   - VAC changes with WCN M/W/F at 8 am; therapies notified  7. Fluids/Electrolytes/Nutrition: Routine in and outs with follow-up chemistries - K 3.4; k dur 20 meq BID for 3 days, recheck Monday. Good PO intakes.  Ascorbic acid, B12 for wound healing. -11/18/22 weekly labs ordered for Monday  8.  ID.  Continue Augmentin 875-125mg  BID/Diflucan 400mg  QD as per Dr. Algis Liming of infectious disease.  Await  plan by infectious disease on duration of antibiotic -  - Infectious disease was consulted and recommend one month of Augmentin and fluconazole with repeat CT scan to make sure that all fluid collections are resolving prior to discontinuing antibiotics.   -7/5 Discontinue PICC line  9.  Acute blood loss anemia.  Follow-up CBC  - Stable 8; monitor  10.  Decreased nutritional storage.  TPN discontinued.  Continue regular diet - Adequate POs; monitor, encourage protein supplementation for wound healing  11.  Obesity.  BMI 36.91.  Dietary follow-up 12.  Diabetes mellitus.  Hemoglobin A1c 8.5.  Currently on sliding scale insulin.   Patient on Glucophage 500 mg twice daily prior to admission.  Resume as needed   -11/18/22 CBGs fairly well-controlled, monitor Recent Labs    11/15/22 2114 11/16/22 0727 11/16/22 1136  GLUCAP 98 125* 158*     13.  Hypertension.  Cozaar 25-50 mg daily and hydrochlorothiazide 6.25-12.5 mg daily. -Mildly hypertensive, given significant debility would allow to run a little high to prevent orthostasis; monitor -11/18/22 BPs doing well, monitor  Vitals:   11/16/22 1546 11/16/22 2030 11/17/22 0553 11/17/22 0802  BP: 122/67 (!) 141/74 (!) 140/78 (!) 140/77   11/17/22 1510 11/17/22 2012 11/18/22 0514  BP: 117/70 133/70 127/72       LOS: 2 days A FACE TO FACE EVALUATION WAS PERFORMED  [address] 11/18/2022, 11:20 AM

## 2022-11-19 DIAGNOSIS — Z794 Long term (current) use of insulin: Secondary | ICD-10-CM

## 2022-11-19 DIAGNOSIS — E1165 Type 2 diabetes mellitus with hyperglycemia: Secondary | ICD-10-CM

## 2022-11-19 LAB — GLUCOSE, CAPILLARY
Glucose-Capillary: 120 mg/dL — ABNORMAL HIGH (ref 70–99)
Glucose-Capillary: 105 mg/dL — ABNORMAL HIGH (ref 70–99)
Glucose-Capillary: 118 mg/dL — ABNORMAL HIGH (ref 70–99)

## 2022-11-19 MED ORDER — INSULIN ASPART 100 UNIT/ML IJ SOLN
0.0000 [IU] | Freq: Three times a day (TID) | INTRAMUSCULAR | Status: DC
  Administered 2022-11-20: 2 [IU] via SUBCUTANEOUS
  Administered 2022-11-21: 3 [IU] via SUBCUTANEOUS
  Administered 2022-11-23: 2 [IU] via SUBCUTANEOUS

## 2022-11-19 MED ORDER — FLUTICASONE PROPIONATE 50 MCG/ACT NA SUSP
2.0000 | Freq: Every day | NASAL | Status: DC
  Administered 2022-11-19 – 2022-11-24 (×6): 2 via NASAL
  Filled 2022-11-19: qty 16

## 2022-11-19 MED ORDER — INSULIN ASPART 100 UNIT/ML IJ SOLN: 0.0000 [IU] | Freq: Every day | INTRAMUSCULAR | Status: DC

## 2022-11-19 MED ORDER — LORATADINE 10 MG PO TABS
10.0000 mg | ORAL_TABLET | Freq: Every day | ORAL | Status: DC
  Administered 2022-11-19 – 2022-11-24 (×6): 10 mg via ORAL
  Filled 2022-11-19 (×6): qty 1

## 2022-11-19 NOTE — Progress Notes (Signed)
PROGRESS NOTE   Subjective/Complaints:  Doing well today. Slept well. Denies any pain today. LBM was yesterday morning. Urinating fine. Mentions that her ears have some fullness in them-- takes flonase and zyrtec at home usually. Denies any other complaints or concerns.   ROS: Denies fevers, chills, N/V, constipation, diarrhea, SOB, cough, chest pain, new weakness or paraesthesias.    Objective:   No results found. Recent Labs    11/16/22 1700  WBC 12.0*  HGB 8.3*  HCT 27.6*  PLT 386   Recent Labs    11/16/22 1700  NA 135  K 3.4*  CL 97*  CO2 26  GLUCOSE 103*  BUN 14  CREATININE 0.55  CALCIUM 8.4*    Intake/Output Summary (Last 24 hours) at 11/19/2022 1126 Last data filed at 11/19/2022 0710 Gross per 24 hour  Intake 720 ml  Output --  Net 720 ml        Physical Exam: Vital Signs Blood pressure 130/78, pulse 80, temperature 98.5 F (36.9 C), resp. rate 16, height 5\' 5"  (1.651 m), weight 99.9 kg, SpO2 95 %.  General: No acute distress.  Laying in bed Mood and affect are appropriate Heart: Regular rate and rhythm no rubs murmurs or extra sounds Lungs: Clear to auscultation, breathing unlabored, no rales or wheezes Abdomen: Positive bowel sounds although perhaps slightly hypoactive, nonTTP, no r/g/r, distended, mildline wound vac with excellent seal and serous output in canister Extremities: No clubbing, cyanosis, or edema Skin: No evidence of breakdown, no evidence of rash, no drainage from former JP sites, wound vac on abd  PRIOR EXAMS: Neurologic: Awake, alert, and oriented x 3.  Appropriate cognition., motor strength is 5-/5 in bilateral deltoid, bicep, tricep, grip, hip flexor, knee extensors, ankle dorsiflexor and plantar flexor Sensory exam normal sensation to light touch and proprioception in bilateral upper mildly diminished lower extremities Cerebellar exam normal finger to nose to  finger Musculoskeletal: Full range of motion in all 4 extremities. No joint swelling    Assessment/Plan: 1. Functional deficits which require 3+ hours per day of interdisciplinary therapy in a comprehensive inpatient rehab setting. Physiatrist is providing close team supervision and 24 hour management of active medical problems listed below. Physiatrist and rehab team continue to assess barriers to discharge/monitor patient progress toward functional and medical goals  Care Tool:  Bathing    Body parts bathed by patient: Right arm, Left arm, Chest, Abdomen, Right upper leg, Left upper leg, Face   Body parts bathed by helper: Front perineal area, Buttocks, Left lower leg, Right lower leg     Bathing assist Assist Level: Moderate Assistance - Patient 50 - 74%     Upper Body Dressing/Undressing Upper body dressing   What is the patient wearing?: Pull over shirt    Upper body assist Assist Level: Contact Guard/Touching assist    Lower Body Dressing/Undressing Lower body dressing      What is the patient wearing?: Pants, Underwear/pull up     Lower body assist Assist for lower body dressing: Maximal Assistance - Patient 25 - 49%     Toileting Toileting    Toileting assist Assist for toileting: Moderate Assistance - Patient 50 -  74%     Transfers Chair/bed transfer  Transfers assist  Chair/bed transfer activity did not occur: Safety/medical concerns  Chair/bed transfer assist level: Minimal Assistance - Patient > 75%     Locomotion Ambulation   Ambulation assist      Assist level: Minimal Assistance - Patient > 75% Assistive device: Walker-rolling Max distance: 180   Walk 10 feet activity   Assist     Assist level: Minimal Assistance - Patient > 75% Assistive device: Walker-rolling   Walk 50 feet activity   Assist    Assist level: Minimal Assistance - Patient > 75% Assistive device: Walker-rolling    Walk 150 feet activity   Assist     Assist level: Minimal Assistance - Patient > 75% Assistive device: Walker-rolling    Walk 10 feet on uneven surface  activity   Assist     Assist level: Minimal Assistance - Patient > 75% Assistive device: Walker-rolling   Wheelchair     Assist Is the patient using a wheelchair?: No             Wheelchair 50 feet with 2 turns activity    Assist            Wheelchair 150 feet activity     Assist          Blood pressure 130/78, pulse 80, temperature 98.5 F (36.9 C), resp. rate 16, height 5\' 5"  (1.651 m), weight 99.9 kg, SpO2 95 %.    Medical Problem List and Plan: 1. Functional deficits secondary to debility secondary to ventral hernia repair 10/23/2022 complicated by bowel perforation, sepsis and peritonitis status post exploratory laparotomy 6/16 and washout on 618, 6/20 with closing of abdomen 6/22.  Continue wound VAC per general surgery             -patient may not shower             -ELOS/Goals: 14-18d  -continue CIR  2.  Antithrombotics: -DVT/anticoagulation:  Pharmaceutical: Lovenox 40mg  QD             -antiplatelet therapy: N/A 3. Pain Management: Neurontin 400 mg 3 times daily, oxycodone 5mg  q4h as needed  -7/6-7/24 Pain well-controlled on current regimen, monitor 4. Mood/Behavior/Sleep: Provide emotional support             -antipsychotic agents: N/A 5. Neuropsych/cognition: This patient is capable of making decisions on her own behalf. 6. Skin/Wound Care: Routine skin checks   - VAC changes with WCN M/W/F at 8 am; therapies notified -11/19/22 pt very concerned with wound vac and d/c plans-- advised they would discuss this during the week, whether she will go home with it or not, and what that would look like. Works in a daycare.   7. Fluids/Electrolytes/Nutrition: Routine in and outs with follow-up chemistries - K 3.4; k dur 20 meq BID for 3 days, recheck Monday. Good PO intakes.  Ascorbic acid, B12 for wound healing. -11/18/22 weekly  labs ordered for Monday  8.  ID.  Continue Augmentin 875-125mg  BID/Diflucan 400mg  QD as per Dr. Algis Liming of infectious disease.  Await plan by infectious disease on duration of antibiotic -  - Infectious disease was consulted and recommend one month of Augmentin and fluconazole with repeat CT scan to make sure that all fluid collections are resolving prior to discontinuing antibiotics.   -7/5 Discontinue PICC line  9.  Acute blood loss anemia.  Follow-up CBC  - Stable 8; monitor  10.  Decreased nutritional storage.  TPN  discontinued.  Continue regular diet - Adequate POs; monitor, encourage protein supplementation for wound healing  11.  Obesity.  BMI 36.91.  Dietary follow-up 12.  Diabetes mellitus.  Hemoglobin A1c 8.5.  Currently on sliding scale insulin.   Patient on Glucophage 500 mg twice daily prior to admission.  Resume as needed -11/19/22 No CBGs since 7/4, no monitoring order placed; will have CBGs checked TID AC+QHS and add SSI back on-- appears it never got released. Might be able to restart Metformin, but defer to weekday team Recent Labs    11/16/22 1136  GLUCAP 158*     13.  Hypertension.  Cozaar 25-50 mg daily and hydrochlorothiazide 6.25-12.5 mg daily. -Mildly hypertensive, given significant debility would allow to run a little high to prevent orthostasis; monitor -7/6-7/24 BPs doing well, monitor  Vitals:   11/16/22 1546 11/16/22 2030 11/17/22 0553 11/17/22 0802  BP: 122/67 (!) 141/74 (!) 140/78 (!) 140/77   11/17/22 1510 11/17/22 2012 11/18/22 0514 11/18/22 1305  BP: 117/70 133/70 127/72 137/71   11/18/22 1851 11/19/22 0438  BP: 138/68 130/78    14. 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 Will add these on today, 11/19/22; 2 sprays each nostril daily for flonase, and loratidine 10mg  daily. Monitor.   I spent >63mins performing patient care related activities, including face to face time, documentation time, med  management, and overall coordination of care.     LOS: 3 days A FACE TO FACE EVALUATION WAS PERFORMED  [address] 11/19/2022, 11:26 AM

## 2022-11-19 NOTE — IPOC Note (Signed)
Overall Plan of Care [facility]) Patient Details Name: Kaitlyn Bray MRN: [id number] DOB: March 17, 1966  Admitting Diagnosis: Debility  Hospital Problems: Principal Problem:   Debility     Functional Problem List: Nursing Bladder, Bowel, Skin Integrity, Endurance, Medication Management, Motor, Pain, Safety  PT Balance, Endurance, Skin Integrity  OT Balance, Skin Integrity, Endurance  SLP    TR         Basic ADL's: OT Bathing, Dressing, Toileting     Advanced  ADL's: OT Simple Meal Preparation, Light Housekeeping     Transfers: PT Bed Mobility, Bed to Chair, Car, Occupational psychologist, Research scientist (life sciences): PT Ambulation, Stairs     Additional Impairments: OT    SLP        TR      Anticipated Outcomes Item Anticipated Outcome  Self Feeding Independent  Swallowing      Basic self-care  Supervision/ Modified independent  Toileting  Supervision/ Modified Independent   Bathroom Transfers Modified Independent  Bowel/Bladder  Continent B/B  Transfers  ModI with LRAD  Locomotion  ModI with LRAD  Communication     Cognition     Pain  less than 3  Safety/Judgment  no falls   Therapy Plan: PT Intensity: Minimum of 1-2 x/day ,45 to 90 minutes PT Frequency: 5 out of 7 days PT Duration Estimated Length of Stay: 7-10 days OT Intensity: Minimum of 1-2 x/day, 45 to 90 minutes OT Frequency: 5 out of 7 days OT Duration/Estimated Length of Stay: 10-14 days     Team Interventions: Nursing Interventions Patient/Family Education, Medication Management, Psychosocial Support, Bladder Management, Bowel Management, Skin Care/Wound Management, Disease Management/Prevention, Pain Management, Discharge Planning  PT interventions Ambulation/gait training, Community reintegration, DME/adaptive equipment instruction, Psychosocial support, Neuromuscular re-education, Stair training, UE/LE Strength taining/ROM, Warden/ranger, Discharge planning, Pain management,  Skin care/wound management, Therapeutic Activities, UE/LE Coordination activities, Cognitive remediation/compensation, Disease management/prevention, Functional mobility training, Patient/family education, Therapeutic Exercise, Visual/perceptual remediation/compensation  OT Interventions Balance/vestibular training, Discharge planning, Pain management, Self Care/advanced ADL retraining, Therapeutic Activities, UE/LE Coordination activities, Therapeutic Exercise, Skin care/wound managment, Patient/family education, Functional mobility training, Community reintegration, Fish farm manager, UE/LE Strength taining/ROM  SLP Interventions    TR Interventions    SW/CM Interventions Discharge Planning, Psychosocial Support, Patient/Family Education   Barriers to Discharge MD  Medical stability  Nursing Decreased caregiver support, Inaccessible home environment, IV antibiotics, Incontinence, Wound Care, Weight, Pending surgery 1 level 8 ste in back with rail R/L or 3 ste in the front with no rail  PT Insurance for SNF coverage, Wound Care, Nutrition means    OT      SLP      SW Decreased caregiver support, Lack of/limited family support, Insurance for SNF coverage, Wound Care     Team Discharge Planning: Destination: PT-Home ,OT- Home , SLP-  Projected Follow-up: PT-Outpatient PT, OT-  Home health OT, SLP-  Projected Equipment Needs: PT-To be determined, OT- 3 in 1 bedside comode, To be determined, SLP-  Equipment Details: PT-TBD, OT-shower DME needs to be assessed once cleared for shower. Patient/family involved in discharge planning: PT- Patient,  OT-Patient, SLP-   MD ELOS: 14-18d Medical Rehab Prognosis:  Good Assessment: The patient has been admitted for CIR therapies with the diagnosis of debility. The team will be addressing functional mobility, strength, stamina, balance, safety, adaptive techniques and equipment, self-care, bowel and bladder mgt, patient and caregiver  education, wound care. Goals have been set at sup/modI. Anticipated discharge destination  is Home.        See Team Conference Notes for weekly updates to the plan of care

## 2022-11-20 LAB — BASIC METABOLIC PANEL
Anion gap: 12 (ref 5–15)
BUN: 12 mg/dL (ref 6–20)
CO2: 24 mmol/L (ref 22–32)
Calcium: 8.7 mg/dL — ABNORMAL LOW (ref 8.9–10.3)
Chloride: 99 mmol/L (ref 98–111)
Creatinine, Ser: 0.6 mg/dL (ref 0.44–1.00)
GFR, Estimated: >60 mL/min (ref >60)
Glucose, Bld: 121 mg/dL — ABNORMAL HIGH (ref 70–99)
Potassium: 4.2 mmol/L (ref 3.5–5.1)
Sodium: 135 mmol/L (ref 135–145)

## 2022-11-20 LAB — CBC
HCT: 30.6 % — ABNORMAL LOW (ref 36.0–46.0)
Hemoglobin: 9.1 g/dL — ABNORMAL LOW (ref 12.0–15.0)
MCH: 26.5 pg (ref 26.0–34.0)
MCHC: 29.7 g/dL — ABNORMAL LOW (ref 30.0–36.0)
MCV: 89 fL (ref 80.0–100.0)
Platelets: 403 10*3/uL — ABNORMAL HIGH (ref 150–400)
RBC: 3.44 MIL/uL — ABNORMAL LOW (ref 3.87–5.11)
RDW: 15.5 % (ref 11.5–15.5)
WBC: 9.8 10*3/uL (ref 4.0–10.5)
nRBC: 0 % (ref 0.0–0.2)

## 2022-11-20 LAB — GLUCOSE, CAPILLARY
Glucose-Capillary: 122 mg/dL — ABNORMAL HIGH (ref 70–99)
Glucose-Capillary: 104 mg/dL — ABNORMAL HIGH (ref 70–99)
Glucose-Capillary: 128 mg/dL — ABNORMAL HIGH (ref 70–99)
Glucose-Capillary: 98 mg/dL (ref 70–99)

## 2022-11-20 MED ORDER — METFORMIN HCL 500 MG PO TABS
500.0000 mg | ORAL_TABLET | Freq: Two times a day (BID) | ORAL | Status: DC
  Administered 2022-11-20 – 2022-11-24 (×9): 500 mg via ORAL
  Filled 2022-11-20 (×9): qty 1

## 2022-11-20 NOTE — Progress Notes (Signed)
Physical Therapy Session Note  Patient Details  Name: Kaitlyn Bray MRN: [id number] Date of Birth: [birth year]  Today's Date: 11/20/2022 PT Individual Time: 1105-1200 PT Individual Time Calculation (min): 55 min   Short Term Goals: Week 1:  PT Short Term Goal 1 (Week 1): STGs=LTGs secondary to ELOS  Skilled Therapeutic Interventions/Progress Updates:     Pt received seated in [facility] and agrees to therapy. No complaint of pain. Pt asking about finishing up laundry. WC transport to gym. Pt's clothing noted to be in dryer still being dried. Pt performs sit to stand with cues for hand placement on WC for improved body mechanics and safety. Pt ambulates x175' with with RW and CGA, with cues to increase step height to improve safety, decrease WB through RW for energy conservation and body mechanics, and maintaining upright gaze to improve balance. Pt take seated rest break. Pt then stands with CGA and RW. Pt then performs standing activity without RW to challenge dynamic balance. Pt performs lateral weight shifting progressing to marching in place with high knees x10 with each leg and CGA. Seated rest break. Pt progresses activity by performing targeted toe tapping to colored and numbered latex circles. PT provides CGA for majority of activity with minA required for tapping contralateral circles from lower extremity. Pt completes x20 taps prior to rest break. Following rest break, pt completes same activity with addition of performing two-step commands to increase challenge. CGA/minA at trunk provided throughout. Pt then ambulates x90' without AD, with minA at trunk and cues for upright gaze and increased gait speed to decrease risk for falls. WC transport back to room. Left seated in WC with alarm intact and all needs within reach.   Therapy Documentation Precautions:  Precautions Precautions: Fall Precaution Comments: Abdominal wound vac, abdominal precautions Restrictions Weight Bearing Restrictions:  No  Therapy/Group: Individual Therapy  Beau Fanny, PT, DPT 11/20/2022, 5:02 PM

## 2022-11-20 NOTE — Care Management (Signed)
Inpatient Rehabilitation Center Individual Statement of Services  Patient Name:  Kaitlyn Bray  Date:  11/20/2022  Welcome to the Inpatient Rehabilitation Center.  Our goal is to provide you with an individualized program based on your diagnosis and situation, designed to meet your specific needs.  With this comprehensive rehabilitation program, you will be expected to participate in at least 3 hours of rehabilitation therapies Monday-Friday, with modified therapy programming on the weekends.  Your rehabilitation program will include the following services:  Physical Therapy (PT), Occupational Therapy (OT), 24 hour per day rehabilitation nursing, Therapeutic Recreaction (TR), Psychology, Neuropsychology, Care Coordinator, Rehabilitation Medicine, Nutrition Services, Pharmacy Services, and Other  Weekly team conferences will be held on Tuesdays to discuss your progress.  Your Inpatient Rehabilitation Care Coordinator will talk with you frequently to get your input and to update you on team discussions.  Team conferences with you and your family in attendance may also be held.  Expected length of stay: 7-14 days    Overall anticipated outcome: Independent with Assistive Device  Depending on your progress and recovery, your program may change. Your Inpatient Rehabilitation Care Coordinator will coordinate services and will keep you informed of any changes. Your Inpatient Rehabilitation Care Coordinator's name and contact numbers are listed  below.  The following services may also be recommended but are not provided by the Inpatient Rehabilitation Center:  Driving Evaluations Home Health Rehabiltiation Services Outpatient Rehabilitation Services Vocational Rehabilitation   Arrangements will be made to provide these services after discharge if needed.  Arrangements include referral to agencies that provide these services.  Your insurance has been verified to be:  [employer]  Your primary  doctor is:  Mort Sawyers   Pertinent information will be shared with your doctor and your insurance company.  Inpatient Rehabilitation Care Coordinator:  Susie Cassette [phone] or [phone]  Information discussed with and copy given to patient by: [name], 11/20/2022, 2:09 PM

## 2022-11-20 NOTE — Progress Notes (Signed)
PROGRESS NOTE   Subjective/Complaints:  No events overnight. 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 Stated would discuss in conference tomorrow and may recommend short term work leave; she does not have STD benefits.  Vitals stable, labs WNL Last BM 7/6   ROS: Denies fevers, chills, N/V, constipation, diarrhea, SOB, cough, chest pain, new weakness or paraesthesias.    Objective:   No results found. Recent Labs    11/20/22 0646  WBC 9.8  HGB 9.1*  HCT 30.6*  PLT 403*    Recent Labs    11/20/22 0646  NA 135  K 4.2  CL 99  CO2 24  GLUCOSE 121*  BUN 12  CREATININE 0.60  CALCIUM 8.7*     Intake/Output Summary (Last 24 hours) at 11/20/2022 0810 Last data filed at 11/20/2022 0735 Gross per 24 hour  Intake 657 ml  Output --  Net 657 ml         Physical Exam: Vital Signs Blood pressure 129/73, pulse 81, temperature 98.2 F (36.8 C), resp. rate 16, height 5\' 5"  (1.651 m), weight 99.9 kg, SpO2 99 %.  General: No acute distress. Walking in gym with PT, SPV.  Mood and affect are appropriate Heart: Regular rate and rhythm no rubs murmurs or extra sounds Lungs: Clear to auscultation, breathing unlabored, no rales or wheezes Abdomen: Positive bowel sounds although perhaps slightly hypoactive, nonTTP, no r/g/r, distended, mildline wound vac with excellent seal and serous output in canister Extremities: No clubbing, cyanosis, or edema Skin no drainage from former JP sites, wound vac on abd, no outut in cannister.   Neurologic: Awake, alert, and oriented x 3.  Appropriate cognition., motor strength is 5-/5 in bilateral deltoid, bicep, tricep, grip, hip flexor, knee extensors, ankle dorsiflexor and plantar flexor Sensory exam normal sensation to light touch and proprioception in bilateral upper mildly diminished lower extremities Cerebellar  exam normal finger to nose to finger Musculoskeletal: Full range of motion in all 4 extremities. No joint swelling    Assessment/Plan: 1. Functional deficits which require 3+ hours per day of interdisciplinary therapy in a comprehensive inpatient rehab setting. Physiatrist is providing close team supervision and 24 hour management of active medical problems listed below. Physiatrist and rehab team continue to assess barriers to discharge/monitor patient progress toward functional and medical goals  Care Tool:  Bathing    Body parts bathed by patient: Right arm, Left arm, Chest, Abdomen, Right upper leg, Left upper leg, Face   Body parts bathed by helper: Front perineal area, Buttocks, Left lower leg, Right lower leg     Bathing assist Assist Level: Moderate Assistance - Patient 50 - 74%     Upper Body Dressing/Undressing Upper body dressing   What is the patient wearing?: Pull over shirt    Upper body assist Assist Level: Contact Guard/Touching assist    Lower Body Dressing/Undressing Lower body dressing      What is the patient wearing?: Pants, Underwear/pull up     Lower body assist Assist for lower body dressing: Maximal Assistance - Patient 25 - 49%  Toileting Toileting    Toileting assist Assist for toileting: Moderate Assistance - Patient 50 - 74%     Transfers Chair/bed transfer  Transfers assist  Chair/bed transfer activity did not occur: Safety/medical concerns  Chair/bed transfer assist level: Minimal Assistance - Patient > 75%     Locomotion Ambulation   Ambulation assist      Assist level: Minimal Assistance - Patient > 75% Assistive device: Walker-rolling Max distance: 180   Walk 10 feet activity   Assist     Assist level: Minimal Assistance - Patient > 75% Assistive device: Walker-rolling   Walk 50 feet activity   Assist    Assist level: Minimal Assistance - Patient > 75% Assistive device: Walker-rolling    Walk 150  feet activity   Assist    Assist level: Minimal Assistance - Patient > 75% Assistive device: Walker-rolling    Walk 10 feet on uneven surface  activity   Assist     Assist level: Minimal Assistance - Patient > 75% Assistive device: Walker-rolling   Wheelchair     Assist Is the patient using a wheelchair?: No             Wheelchair 50 feet with 2 turns activity    Assist            Wheelchair 150 feet activity     Assist          Blood pressure 129/73, pulse 81, temperature 98.2 F (36.8 C), resp. rate 16, height 5\' 5"  (1.651 m), weight 99.9 kg, SpO2 99 %.    Medical Problem List and Plan: 1. Functional deficits secondary to debility secondary to ventral hernia repair 10/23/2022 complicated by bowel perforation, sepsis and peritonitis status post exploratory laparotomy 6/16 and washout on 618, 6/20 with closing of abdomen 6/22.  Continue wound VAC per general surgery             -patient may not shower             -ELOS/Goals: 14-18d  -continue CIR  2.  Antithrombotics: -DVT/anticoagulation:  Pharmaceutical: Lovenox 40mg  QD             -antiplatelet therapy: N/A 3. Pain Management: Neurontin 400 mg 3 times daily, oxycodone 5mg  q4h as needed  -7/6-7/8 Pain well-controlled on current regimen, monitor  4. Mood/Behavior/Sleep: Provide emotional support             -antipsychotic agents: N/A 5. Neuropsych/cognition: This patient is capable of making decisions on her own behalf. 6. Skin/Wound Care: Routine skin checks   - VAC changes with WCN M/W/F at 8 am; therapies notified -11/19/22 pt very concerned with wound vac and d/c plans-- advised they would discuss this during the week, whether she will go home with it or not, and what that would look like. Works in a daycare.  - will discuss at teams this week  7. Fluids/Electrolytes/Nutrition: Routine in and outs with follow-up chemistries - K 3.4; k dur 20 meq BID for 3 days, recheck Monday. Good PO  intakes.  Ascorbic acid, B12 for wound healing. -11/18/22 weekly labs ordered for Monday - 7/8: Labs stable  8.  ID.  Continue Augmentin 875-125mg  BID/Diflucan 400mg  QD as per Dr. Algis Liming of infectious disease.  Await plan by infectious disease on duration of antibiotic -  - Infectious disease was consulted and recommend one month of Augmentin and fluconazole with repeat CT scan to make sure that all fluid collections are resolving prior to discontinuing antibiotics.   -  7/5 Discontinue PICC line  9.  Acute blood loss anemia.  Follow-up CBC  - Stable 8; monitor  10.  Decreased nutritional storage.  TPN discontinued.  Continue regular diet - Adequate POs; monitor, encourage protein supplementation for wound healing  11.  Obesity.  BMI 36.91.  Dietary follow-up 12.  Diabetes mellitus.  Hemoglobin A1c 8.5.  Currently on sliding scale insulin.   Patient on Glucophage 500 mg twice daily prior to admission.  Resume as needed -11/19/22 No CBGs since 7/4, no monitoring order placed; will have CBGs checked TID AC+QHS and add SSI back on-- appears it never got released. Might be able to restart Metformin, but defer to weekday team -7/8: resume metformin Recent Labs    11/19/22 1623 11/19/22 2127 11/20/22 0558  GLUCAP 105* 118* 122*      13.  Hypertension.  Cozaar 25-50 mg daily and hydrochlorothiazide 6.25-12.5 mg daily. -Mildly hypertensive, given significant debility would allow to run a little high to prevent orthostasis; monitor -7/6-7/24 BPs doing well, monitor  Vitals:   11/16/22 2030 11/17/22 0553 11/17/22 0802 11/17/22 1510  BP: (!) 141/74 (!) 140/78 (!) 140/77 117/70   11/17/22 2012 11/18/22 0514 11/18/22 1305 11/18/22 1851  BP: 133/70 127/72 137/71 138/68   11/19/22 0438 11/19/22 1323 11/19/22 1937 11/20/22 0412  BP: 130/78 124/70 128/74 129/73    14. 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 Will add these on today, 11/19/22; 2 sprays  each nostril daily for flonase, and loratidine 10mg  daily. Monitor.     LOS: 4 days A FACE TO FACE EVALUATION WAS PERFORMED  Angelina Sheriff 11/20/2022, 8:10 AM

## 2022-11-20 NOTE — Progress Notes (Signed)
Patient ID: Kaitlyn Bray, female   DOB: 10/20/1965, 57 y.o.   MRN: [id number]   SW received updates from Kelly/[facility] declining referral.   SW sent referral to Angie/[facility] and waiting on follow-up.   Declined HHAs [facility]  Cecile Sheerer, MSW, [place] Office: [phone] Cell: [phone] Fax: [phone]

## 2022-11-20 NOTE — Progress Notes (Signed)
Physical Therapy Session Note  Patient Details  Name: Kaitlyn Bray MRN: [id number] Date of Birth: 04-05-1966  Today's Date: 11/20/2022 PT Individual Time: 0900-1015 PT Individual Time Calculation (min): 75 min   Short Term Goals: Week 1:  PT Short Term Goal 1 (Week 1): STGs=LTGs secondary to ELOS  Skilled Therapeutic Interventions/Progress Updates:  Patient greeted sitting in bedside recliner in her room and agreeable to PT treatment session. Patient requesting to do laundry this morning secondary to not having any clean clothes. Patient stood from recliner with RW and SBA- Patient then gait trained (~10') to wheelchair with SBA for safety. Physician present for morning rounds. Patient wheeled to/from her room and day room for time management and energy conservation.   Patient gait trained ~180' with RW and SBA for safety- Along the way patient dropped her clothes off in the laundry room and started the washing machine with Supv. VC throughout gait trial for decreased UE support and forward gaze throughout.   Patient gait trained x50' with R HHA and CGA and x50' without UE support and CGA/MinA for improved stability- Without BUE support, patient demonstrates decreased B step length and foot clearance with impaired endurance/activity tolerance. VC for improved postural extension throughout gait trials with increased anterior propulsion noted. Patient required extended seated rest break in between each gait trial.   Patient performed alternating step-ups to airex foam pad with 2.5# and RW for balance with CGA from therapist for safety- Patient performed x10 with each LE leading.   As an active rest break, patient performed 2 x 10 LAQ with 2.5# with 3 second hold in full extension for improved BLE strength.   Patient gait trained x120' with RW and SBA for safety- Patient with 2.5# weights donned to B ankles in order to increased BLE strength and also provide tactile cues for increased step length  and foot clearance with good improvements noted.   Patient returned to her room and left sitting upright in wheelchair with call bell within reach, tray table in front and all needs met.    Therapy Documentation Precautions:  Precautions Precautions: Fall Precaution Comments: Abdominal wound vac, abdominal precautions Restrictions Weight Bearing Restrictions: No  Pain: No/Denies pain.    Therapy/Group: Individual Therapy  Chad Donoghue 11/20/2022, 7:53 AM

## 2022-11-20 NOTE — Progress Notes (Signed)
Occupational Therapy Session Note  Patient Details  Name: Kaitlyn Bray MRN: [id number] Date of Birth: 05-09-1966  Today's Date: 11/20/2022 OT Individual Time: 1300-1400 OT Individual Time Calculation (min): 60 min    Short Term Goals: Week 1:  OT Short Term Goal 1 (Week 1): Patient to perform toileting with SBA OT Short Term Goal 2 (Week 1): Patient to perform LE dressing with CGA and use of AE PRN OT Short Term Goal 3 (Week 1): Patient to perform bathing Min assist with AE PRN OT Short Term Goal 4 (Week 1): Patient to tolerate stading for grooming tasks CGA x 3 minutes.    Skilled Therapeutic Interventions/Progress Updates:   Pt received sitting in wheelchair she had just finished eating lunch.  No c/o pain.  Pt receptive to OT session.  Sit<>stand exercises completed 10 times with CGA for safety.  Dynamic balance activity completed within the parallel bars with CGA bilateral knee raises, hip abduction x 10 for each.  Dynamic balance activity standing at table reaching across midline using peg board completed with CGA and no rest breaks.  Pt weight bearing on LUE reaching across midline with R hand without use of AD. Pt able to complete dynamic standing exercise using 3lb weighted dowel for shoulder flexion 2 x10.  Pt back in room, chair alarm activated, call light within reach and all needs met.   Therapy Documentation Precautions:  Precautions Precautions: Fall Precaution Comments: Abdominal wound vac, abdominal precautions Restrictions Weight Bearing Restrictions: No      Therapy/Group: Individual Therapy  Liam Graham 11/20/2022, 1:23 PM

## 2022-11-20 NOTE — Consult Note (Addendum)
WOC Nurse wound follow up; NPWT M/W/F  Wound type: full thickness postop midline  Wound measurements: 15 cm x 10 cm x 1.5 cm; greater than 3 cm undermining 3-9 o'clock  Wound bed: Mesh in wound bed, surrounding tissue pink and moist; undermining noted  as above  Drainage (amount, consistency, odor) cannister 400 ccs yellow fluid Periwound: intact  Dressing procedure/placement/frequency: Removed old NPWT dressing Cleansed wound with normal saline Covered mesh with Mepitel, Filled wound with 3 pieces white foam (tucked into area of undermining), 1 piece of black foam Sealed NPWT dressing at HG Patient received PO pain medication per bedside nurse prior to dressing change Patient tolerated procedure well; cannister changed at this visit   WOC nurse will continue to provide NPWT dressing changes due to the complexity of the dressing change.  Next dressing change Wednesday 11/22/2022; 1 medium kit, 3 packs white foam, 1 piece Mepitel in room for next dressing change   Thank you,     Priscella Mann MSN, RN-BC, [employer] [phone]

## 2022-11-21 LAB — GLUCOSE, CAPILLARY
Glucose-Capillary: 107 mg/dL — ABNORMAL HIGH (ref 70–99)
Glucose-Capillary: 118 mg/dL — ABNORMAL HIGH (ref 70–99)
Glucose-Capillary: 155 mg/dL — ABNORMAL HIGH (ref 70–99)
Glucose-Capillary: 110 mg/dL — ABNORMAL HIGH (ref 70–99)

## 2022-11-21 NOTE — Progress Notes (Signed)
PROGRESS NOTE   Subjective/Complaints:  No events overnight.  Doing extremely well, no complaints or concerns today Vitals stable, labs WNL Last BM 7/6   ROS: Denies fevers, chills, N/V, constipation, diarrhea, SOB, cough, chest pain, new weakness or paraesthesias.    Objective:   No results found. Recent Labs    11/20/22 0646  WBC 9.8  HGB 9.1*  HCT 30.6*  PLT 403*    Recent Labs    11/20/22 0646  NA 135  K 4.2  CL 99  CO2 24  GLUCOSE 121*  BUN 12  CREATININE 0.60  CALCIUM 8.7*     Intake/Output Summary (Last 24 hours) at 11/21/2022 0938 Last data filed at 11/21/2022 0800 Gross per 24 hour  Intake 240 ml  Output 1 ml  Net 239 ml         Physical Exam: Vital Signs Blood pressure 139/72, pulse 91, temperature 97.7 F (36.5 C), resp. rate 16, height 5\' 5"  (1.651 m), weight 99.9 kg, SpO2 95 %.  General: No acute distress.  Sitting up at table, talking with therapies. Mood and affect are appropriate Heart: Regular rate and rhythm no rubs murmurs or extra sounds Lungs: Clear to auscultation, breathing unlabored, no rales or wheezes Abdomen: Positive bowel sounds, normoactive, none distended, mildline wound vac with excellent seal and serous output in canister -250 cc over last 24 hours Extremities: No clubbing, cyanosis, or edema Skin no drainage from former JP sites, wound vac on abd  Neurologic: Awake, alert, and oriented x 3.  Appropriate cognition., motor strength is 5-/5 in bilateral deltoid, bicep, tricep, grip, hip flexor, knee extensors, ankle dorsiflexor and plantar flexor Sensory exam normal sensation to light touch and proprioception in bilateral upper mildly diminished lower extremities Cerebellar exam normal finger to nose to finger Musculoskeletal: Full range of motion in all 4 extremities. No joint swelling    Assessment/Plan: 1. Functional deficits which require 3+ hours per day of  interdisciplinary therapy in a comprehensive inpatient rehab setting. Physiatrist is providing close team supervision and 24 hour management of active medical problems listed below. Physiatrist and rehab team continue to assess barriers to discharge/monitor patient progress toward functional and medical goals  Care Tool:  Bathing    Body parts bathed by patient: Right arm, Left arm, Chest, Abdomen, Right upper leg, Left upper leg, Face   Body parts bathed by helper: Front perineal area, Buttocks, Left lower leg, Right lower leg     Bathing assist Assist Level: Moderate Assistance - Patient 50 - 74%     Upper Body Dressing/Undressing Upper body dressing   What is the patient wearing?: Pull over shirt    Upper body assist Assist Level: Contact Guard/Touching assist    Lower Body Dressing/Undressing Lower body dressing      What is the patient wearing?: Pants, Underwear/pull up     Lower body assist Assist for lower body dressing: Maximal Assistance - Patient 25 - 49%     Toileting Toileting    Toileting assist Assist for toileting: Moderate Assistance - Patient 50 - 74%     Transfers Chair/bed transfer  Transfers assist     Chair/bed transfer assist level:  Minimal Assistance - Patient > 75%     Locomotion Ambulation   Ambulation assist      Assist level: Minimal Assistance - Patient > 75% Assistive device: Walker-rolling Max distance: 180   Walk 10 feet activity   Assist     Assist level: Minimal Assistance - Patient > 75% Assistive device: Walker-rolling   Walk 50 feet activity   Assist    Assist level: Minimal Assistance - Patient > 75% Assistive device: Walker-rolling    Walk 150 feet activity   Assist    Assist level: Minimal Assistance - Patient > 75% Assistive device: Walker-rolling    Walk 10 feet on uneven surface  activity   Assist     Assist level: Minimal Assistance - Patient > 75% Assistive device: Walker-rolling    Wheelchair     Assist Is the patient using a wheelchair?: No             Wheelchair 50 feet with 2 turns activity    Assist            Wheelchair 150 feet activity     Assist          Blood pressure 139/72, pulse 91, temperature 97.7 F (36.5 C), resp. rate 16, height 5\' 5"  (1.651 m), weight 99.9 kg, SpO2 95 %.    Medical Problem List and Plan: 1. Functional deficits secondary to debility secondary to ventral hernia repair 10/23/2022 complicated by bowel perforation, sepsis and peritonitis status post exploratory laparotomy 6/16 and washout on 618, 6/20 with closing of abdomen 6/22.  Continue wound VAC per general surgery             -patient may not shower             -ELOS/Goals: 14-18d - Set DC date based on vac schedule  -continue CIR  - 7/8: Can't get her a home health agency for vac changes because of her current insurance. SPV to CGA for all ADLs. Some mild pain limiting. Supervision bed mobility and standby assist.   2.  Antithrombotics: -DVT/anticoagulation:  Pharmaceutical: Lovenox 40mg  every day - Ambulating >100 feet consistently, independently; DC             -antiplatelet therapy: N/A  3. Pain Management: Neurontin 400 mg 3 times daily, oxycodone 5mg  q4h as needed  -7/6-7/8 Pain well-controlled on current regimen, monitor  4. Mood/Behavior/Sleep: Provide emotional support             -antipsychotic agents: N/A 5. Neuropsych/cognition: This patient is capable of making decisions on her own behalf. 6. Skin/Wound Care: Routine skin checks   - VAC changes with [facility] M/W/F at 8 am; therapies notified -11/19/22 pt very concerned with wound vac and d/c plans-- advised they would discuss this during the week, whether she will go home with it or not, and what that would look like. Works in a daycare.  - will discuss at teams this week 7-9: Per Dr. Dossie Der, intent to manage with wound VAC for several months.  Patient home health benefits are very  limited, will look into options as he does not offer wound VAC changes in his clinic in our wound clinic generally cannot accommodate 3 times weekly VAC changes  7. Fluids/Electrolytes/Nutrition: Routine in and outs with follow-up chemistries - K 3.4; k dur 20 meq BID for 3 days, recheck Monday. Good PO intakes.  Ascorbic acid, B12 for wound healing. -11/18/22 weekly labs ordered for Monday - 7/8: Labs stable  8.  ID.  Continue Augmentin 875-125mg  BID/Diflucan 400mg  QD as per Dr. Algis Liming of infectious disease.  Await plan by infectious disease on duration of antibiotic -  - Infectious disease was consulted and recommend one month of Augmentin and fluconazole with repeat CT scan to make sure that all fluid collections are resolving prior to discontinuing antibiotics.   -7/5 Discontinue PICC line  9.  Acute blood loss anemia.  Follow-up CBC  - Stable 8; monitor  10.  Decreased nutritional storage.  TPN discontinued.  Continue regular diet - Adequate POs; monitor, encourage protein supplementation for wound healing  11.  Obesity.  BMI 36.91.  Dietary follow-up 12.  Diabetes mellitus.  Hemoglobin A1c 8.5.  Currently on sliding scale insulin.   Patient on Glucophage 500 mg twice daily prior to admission.  Resume as needed -11/19/22 No CBGs since 7/4, no monitoring order placed; will have CBGs checked TID AC+QHS and add SSI back on-- appears it never got released. Might be able to restart Metformin, but defer to weekday team -7/8: resume metformin - improved Recent Labs    11/20/22 1712 11/20/22 2141 11/21/22 0610  GLUCAP 128* 98 107*      13.  Hypertension.  Cozaar 25-50 mg daily and hydrochlorothiazide 6.25-12.5 mg daily. -Mildly hypertensive, given significant debility would allow to run a little high to prevent orthostasis; monitor -7/6-7/24 BPs doing well, monitor  Vitals:   11/17/22 1510 11/17/22 2012 11/18/22 0514 11/18/22 1305  BP: 117/70 133/70 127/72 137/71   11/18/22 1851  11/19/22 0438 11/19/22 1323 11/19/22 1937  BP: 138/68 130/78 124/70 128/74   11/20/22 0412 11/20/22 1553 11/20/22 1942 11/21/22 0401  BP: 129/73 128/81 118/75 139/72    14. 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 Will add these on today, 11/19/22; 2 sprays each nostril daily for flonase, and loratidine 10mg  daily. Monitor.     LOS: 5 days A FACE TO FACE EVALUATION WAS PERFORMED  Angelina Sheriff 11/21/2022, 9:38 AM

## 2022-11-21 NOTE — Progress Notes (Signed)
Physical Therapy Session Note  Patient Details  Name: Kaitlyn Bray MRN: [id number] Date of Birth: 1965/09/10  Today's Date: 11/21/2022 PT Individual Time: 1009-1034 + 1350 - 1500 PT Individual Time Calculation (min): 25 min + 70 min  Short Term Goals: Week 1:  PT Short Term Goal 1 (Week 1): STGs=LTGs secondary to ELOS  Skilled Therapeutic Interventions/Progress Updates:     Session 1: Chart reviewed and pt agreeable to therapy. Pt received seated in WC with no c/o pain. Session focused on rollator safety and functional tranasfers to promtoe safe home access. Pt initiated session with amb of 273ft + 36ft using S + rollator. During amb pt completed 2 seated breaks with pt demonstrating good safety awareness with rollator for locking breaks and parking rollator is safe place for sitting. Pt then completed amb to toilet and required S for transfer to toilet followed by WC. Pt then completed 10 high knee marches with S + rollator in preparation of stair training. At end of session, pt was left seated in [facility] with alarm engaged, nurse call bell and all needs in reach.  Session 2: Chart reviewed and pt agreeable to therapy. Pt received seated in WC with no c/o pain. Session focused on stair training to promote activity tolerance and safe home access. Pt initiated session with amb of 224ft to therapy gym using S + rollator. Pt then completed blocked practice of stair training at 3" step that included repeats of 2 steps up/down with B rail. Pt noted to require CGA fading to S. Pt also demonstrated ability to increase repetitions over time, working up to 4 repetitions for 4 sets before resting. Pt then ascended 8 steps with turn and descendent included with CGA. Pt able to repeat 2 sets before rest with CGA fading to S. Pt repeated step and stair exercises at 3" steps with L rail to reduce reliance on rails support. Pt then completed same series of practice on 6" step. Pt remained CGA + B t/o  practice/strength training on 6" steps. Pt demonstrated increased activity tolerance by traversing 4 steps x 2 rounds without rest. Pt amb 276ft to return to room with S + rollator. Session education emphasized safe ramp and/or rail use depending on pt's decision to update current ramp, which pt reports as deteriorated, or put rails on steps. At end of session, pt was left seated in recliner with alarm engaged, nurse call bell and all needs in reach.    Therapy Documentation Precautions:  Precautions Precautions: Fall Precaution Comments: Abdominal wound vac, abdominal precautions Restrictions Weight Bearing Restrictions: No General:      Therapy/Group: Individual Therapy  Dionne Milo, PT, DPT 11/21/2022, 12:55 PM

## 2022-11-21 NOTE — Progress Notes (Signed)
Patient ID: Kaitlyn Bray, female   DOB: 29-May-1965, 57 y.o.   MRN: [id number]  SW met with pt in room to provide updates from team conference on gains made, and biggest barrier to home is establishing a HHA which will dictate d/ date. She is aware on current challenges. She expresses concerns about co-pays for [facility] if needed.   SW sent HHPT/OT/SN (wound care) referral to [facility] and waiting on follow-up. *Referral accepted. SW waiting on updates about if there are any co-pay costs.   SW updated pt on above and will follow-up once there is more information. SW updated medical team as well.   Declined HHAs [facility] Cheryl[facility]  Cecile Sheerer, MSW, [place] Office: [phone] Cell: [phone] Fax: [phone]

## 2022-11-21 NOTE — Discharge Summary (Signed)
Physician Discharge Summary  Patient ID: Kaitlyn Bray MRN: [id number] DOB/AGE: 07-27-1965 57 y.o.  Admit date: 11/16/2022 Discharge date: 11/24/2022  Discharge Diagnoses:  Principal Problem:   Debility DVT prophylaxis Acute blood loss anemia Decreased nutritional storage Obesity Diabetes mellitus Hypertension Constipation  Discharged Condition: Stable  Significant Diagnostic Studies: CT ABDOMEN PELVIS W CONTRAST  Result Date: 11/14/2022 CLINICAL DATA:  Small bowel perforation and intra-abdominal abscess. EXAM: CT ABDOMEN AND PELVIS WITH CONTRAST TECHNIQUE: Multidetector CT imaging of the abdomen and pelvis was performed using the standard protocol following bolus administration of intravenous contrast. RADIATION DOSE REDUCTION: This exam was performed according to the departmental dose-optimization program which includes automated exposure control, adjustment of the mA and/or kV according to patient size and/or use of iterative reconstruction technique. CONTRAST:  OMNIPAQUE IOHEXOL 300 MG/ML  SOLN COMPARISON:  CT abdomen pelvis dated 07/26/2022. FINDINGS: Lower chest: The visualized lung bases are clear. No intra-abdominal free air. Hepatobiliary: Fatty infiltration of the liver. No biliary dilatation. The gallbladder is unremarkable. Pancreas: Unremarkable. No pancreatic ductal dilatation or surrounding inflammatory changes. Spleen: Normal in size without focal abnormality. Adrenals/Urinary Tract: The adrenal glands unremarkable. There is no hydronephrosis on either side. There is symmetric enhancement and excretion of contrast by both kidneys. The visualized ureters and urinary bladder appear unremarkable small focus of air within the bladder, likely introduced via recent catheterization. Stomach/Bowel: There is sigmoid diverticulosis without active inflammatory changes. There is no bowel obstruction or active inflammation. No CT evidence of acute appendicitis. Vascular/Lymphatic: Mild  atherosclerotic calcification of the abdominal aorta. The IVC is unremarkable. No portal venous gas. There is no adenopathy. Reproductive: Hysterectomy.  No adnexal masses. Other: There is diffuse mesenteric edema as well as omental nodularity. There is a 5.8 x 4.6 cm loculated appearing fluid in the left anterior lower abdomen concerning for an infected fluid or developing abscess. This collection may communicate with the anterior pelvic wall open wound. Additional smaller loculated fluid within the mesentery in the anterior lower abdomen, one abutting the small bowel and the other abutting the medial wall of the ascending colon. Two drainage catheters with tips terminating in the left hemipelvis. No fluid collection noted along the course or adjacent to the tips of the drainage catheters. Musculoskeletal: Anterior pelvic wall open wound and wound VAC. No acute osseous pathology. IMPRESSION: 1. Loculated appearing fluid in the left anterior lower abdomen concerning for an infected fluid or developing abscess. This collection may communicate with the anterior pelvic wall open wound. Smaller loculated collections in the lower abdomen. 2. Two drainage catheters with tips terminating in the left hemipelvis. No fluid collection noted along the course or adjacent to the tips of the drainage catheters. 3. Sigmoid diverticulosis. No bowel obstruction. 4. Fatty liver. 5.  Aortic Atherosclerosis (ICD10-I70.0). Electronically Signed   By: Elgie Collard M.D.   On: 11/14/2022 18:34   DG Chest 1 View  Result Date: 11/06/2022 CLINICAL DATA:  Difficulty weaning from ventilator. EXAM: CHEST  1 VIEW COMPARISON:  Chest radiograph 10/29/2022. FINDINGS: The endotracheal tube tip is proximally 2.4 cm from the carina. The right upper extremity PICC tip is in the region of the cavoatrial junction. The enteric catheter tip is not included within the field of view. The cardiomediastinal silhouette is stable allowing for rightward  patient rotation. There is probable mild atelectasis in the right lung base with slight asymmetric elevation of the right hemidiaphragm. Otherwise, there is no focal airspace opacity. There is no evidence of overt pulmonary edema.  There is no significant pleural effusion. There is no pneumothorax There is no acute osseous abnormality. IMPRESSION: 1. Endotracheal tube tip and right upper extremity PICC tip in appropriate position. 2. Probable mild right basilar atelectasis. Electronically Signed   By: Lesia Hausen M.D.   On: 11/06/2022 08:07   DG Abd 1 View  Result Date: 10/29/2022 CLINICAL DATA:  OG tube EXAM: ABDOMEN - 1 VIEW COMPARISON:  None Available. FINDINGS: Orogastric tube tip is seen in the proximal stomach with sidehole at the level of the gastroesophageal junction. No dilated bowel loops are seen. There is left body wall soft tissue air. IMPRESSION: 1. Orogastric tube tip is seen in the proximal stomach with sidehole at the level of the gastroesophageal junction. Consider advancing tube 5 cm. 2. Left body wall soft tissue air of uncertain etiology. Please correlate clinically. Electronically Signed   By: Darliss Cheney M.D.   On: 10/29/2022 20:29   DG Chest Port 1 View  Result Date: 10/29/2022 CLINICAL DATA:  Intubated EXAM: PORTABLE CHEST 1 VIEW COMPARISON:  Chest x-ray 10/27/2022 FINDINGS: The endotracheal tube is 1.9 cm above the carina. Enteric tube extends below the diaphragm. The cardiomediastinal silhouette is within normal limits for projection. There is left basilar atelectasis. The lungs are otherwise clear. There is no pleural effusion or pneumothorax. No acute fractures are seen. IMPRESSION: 1. Endotracheal tube 1.9 cm above the carina. 2. Left basilar atelectasis. Electronically Signed   By: Darliss Cheney M.D.   On: 10/29/2022 20:26   [place] EKG SITE RITE  Result Date: 10/29/2022 If Site Rite image not attached, placement could not be confirmed due to current cardiac rhythm.  DG  Chest Port 1 View  Result Date: 10/27/2022 CLINICAL DATA:  Fever EXAM: PORTABLE CHEST 1 VIEW COMPARISON:  06/12/2021 FINDINGS: Transverse diameter of heart is increased. There are no signs of pulmonary edema or focal pulmonary consolidation. There is no significant pleural effusion or pneumothorax. IMPRESSION: Cardiomegaly. There are no signs of pulmonary edema or focal pulmonary consolidation. Electronically Signed   By: Ernie Avena M.D.   On: 10/27/2022 16:18    Labs:  Basic Metabolic Panel: Recent Labs  Lab 11/20/22 0646  NA 135  K 4.2  CL 99  CO2 24  GLUCOSE 121*  BUN 12  CREATININE 0.60  CALCIUM 8.7*    CBC: Recent Labs  Lab 11/20/22 0646  WBC 9.8  HGB 9.1*  HCT 30.6*  MCV 89.0  PLT 403*    CBG: Recent Labs  Lab 11/22/22 2119 11/23/22 0627 11/23/22 1134 11/23/22 1633 11/23/22 2107  GLUCAP 127* 129* 96 105* 133*   Family history.  Mother with hypertension CVA as well as dementia father with COPD and rheumatoid arthritis.  Denies any colon cancer esophageal cancer or rectal cancer  Brief HPI:   Kaitlyn Bray is a 57 y.o. right-handed female with history of obesity hypertension diabetes mellitus as well as asthma.  Per chart review lives with spouse independent prior to admission.  Presented to [facility]/23/2022 for ventral hernia status post robotic incisional repair 10/23/2022.  Postoperative course complicated by fever and drainage of foul-smelling fluid from her lower abdominal incision site, bowel perforation, sepsis and peritonitis status post exploratory laparotomy 6/16 and washout 6/18, 6/20 with closing of abdomen 6/22 and wound VAC remained in place.  Patient did require short-term intubation through 11/08/2022.  Cultures with group G streptococci bacteroids ovatas beta lactamase positive and Candida albicans.  Placed on broad-spectrum antibiotics.  CT  of the abdomen pelvis completed to rule out any further abscesses showed a loculated fluid  collection in the anterior lower abdomen that radiology felt could be due to developing sepsis that was reviewed by general surgery and felt that collection of fluid seen on CT was fairly expected findings and not likely to be infected.  Patient did have JP drains in place that is since been removed.  Antibiotic transition to Augmentin as well as Diflucan awaiting on duration of antibiotic care.  Lovenox added for DVT prophylaxis.  Hospital course acute blood loss anemia 8.2 and monitored.  Diet advanced to regular after initially being on TPN for nutritional support.  Therapy evaluations completed due to patient decreased functional mobility was admitted for a comprehensive rehab program.   Hospital Course: Kaitlyn Bray was admitted to rehab 11/16/2022 for inpatient therapies to consist of PT, ST and OT at least three hours five days a week. Past admission physiatrist, therapy team and rehab RN have worked together to provide customized collaborative inpatient rehab.  Pertaining to patient debility secondary to ventral hernia repair complicated by bowel perforation sepsis and peritonitis status post exploratory laparotomy multiple washouts and closing of abdomen 6/22.  Wound VAC is indicated follow-up general surgery.  ID follow-up maintained on Augmentin as well as Diflucan awaiting transition of care on how long antibiotic would be ongoing.  Subcutaneous Lovenox for DVT prophylaxis.  Pain manager use of scheduled Neurontin with oxycodone for breakthrough pain.  Acute blood loss anemia stable with latest hemoglobin 9.1.  Decreased nutritional storage initially on TPN since discontinued advance to regular diet supplements as follow per dietary services.  Blood sugars controlled hemoglobin A1c 8.5.  Blood pressure controlled on Cozaar with HCTZ and would need outpatient follow-up.    Blood pressures were monitored on TID basis and controlled  Diabetes has been monitored with ac/hs CBG checks and SSI was use  prn for tighter BS control.    Rehab course: During patient's stay in rehab weekly team conferences were held to monitor patient's progress, set goals and discuss barriers to discharge. At admission, patient required moderate assist step pivot transfers minimal assist side-lying to sitting  Physical exam.  Blood pressure 118/70 pulse 85 temperature 99 respirations 18 oxygen saturation 99% room air Constitutional.  No acute distress HEENT Head.  Normocephalic and atraumatic Eyes.  Pupils round and reactive to light no discharge without nystagmus Neck.  Supple nontender no JVD without thyromegaly Cardiac regular rate and rhythm without any extra sounds or murmur heard Abdomen.  Soft nontender wound VAC in place Respiratory effort normal no respiratory distress without wheeze Neurologic.  Alert and oriented Motor strength 4 -/5 bilateral deltoid bicep tricep grip hip flexors knee extensors ankle dorsi plantarflexion.  He/She  has had improvement in activity tolerance, balance, postural control as well as ability to compensate for deficits. He/She has had improvement in functional use RUE/LUE  and RLE/LLE as well as improvement in awareness.  Ambulating 175 feet rolling walker contact-guard.  Performs lateral weight shifting progressing to marching in place contact-guard.  Gather his belongings for activities day living homemaking.  Full family teaching completed plan discharge to home       Disposition: Discharge to home    Diet: Diabetic diet  Special Instructions: No driving smoking or alcohol  Routine wound VAC changes as directed...  HHRN wound VAC changes Monday Wednesday Friday.  Mepitel and 3 pieces of white foam over the mesh and then undermined areas, use black foam  cover.  Follow-up with infectious disease for follow-up repeat CT scan to assess any fluid collection and establish ongoing need for antibiotic therapy  Medications at discharge 1.  Tylenol as needed 2.   Augmentin 875-125 mg 1 tablet every 12 hours x 1 month 3.  Vitamin C 500 mg p.o. daily 4.  Vitamin B12 500 mcg p.o. daily 5.  Pepcid 20 mg p.o. twice daily 6.  Diflucan 400 mg p.o. daily x 1 month 7.  Neurontin 400 mg p.o. 3 times daily 8.  Claritin 10 mg p.o. daily 9.  Cozaar 25-50 mg daily 10.  Hydrochlorothiazide 6.25-12.5 mg p.o. daily 11.  Antivert 25 mg p.o. 3 times daily as needed dizziness 12.  Glucophage 500 mg p.o. twice daily 13.  Oxycodone 5 mg every 4 hours as needed pain 14.  FiberCon 625 mg p.o. twice daily 15.  Vitamin D 1 tablet daily 16.  Multivitamin daily   30-35 minutes were spent completing discharge summary and discharge planning     Follow-up Information     Angelina Sheriff, DO Follow up.   Specialty: Physical Medicine and Rehabilitation Why: No formal follow-up needed Contact information: [address] [place] [address] [phone]         Stechschulte, Hyman Hopes, MD Follow up.   Specialty: Surgery Why: Call for appointment Contact information: [address] [place] [address] [phone]         Daiva Eves, Lisette Grinder, MD Follow up.   Specialty: Infectious Diseases Why: Call for appointment to arrange follow-up CT scan to monitor fluid collections and establish duration of antibiotic Contact information: [address] [place] [address] [phone]                 Signed: Mcarthur Rossetti Tiffancy Moger 11/24/2022, 4:57 AM

## 2022-11-21 NOTE — Progress Notes (Signed)
Occupational Therapy Session Note  Patient Details  Name: Kaitlyn Bray MRN: [id number] Date of Birth: 04-01-1966  Today's Date: 11/21/2022 OT Individual Time: 1103-1200 OT Individual Time Calculation (min): 57 min    Short Term Goals: Week 1:  OT Short Term Goal 1 (Week 1): Patient to perform toileting with SBA OT Short Term Goal 2 (Week 1): Patient to perform LE dressing with CGA and use of AE PRN OT Short Term Goal 3 (Week 1): Patient to perform bathing Min assist with AE PRN OT Short Term Goal 4 (Week 1): Patient to tolerate stading for grooming tasks CGA x 3 minutes.  Skilled Therapeutic Interventions/Progress Updates:    Pt greeted seated in wc and agreeable to OT treatment session. Pt declined need to participate in BADL tasks and did not need to use the bathroom. Functional ambulation to therapy gym w/ rollator, increased time, and close supervision. Rest break after ambulation. Addressed standing balance standing on foam block. Min A to get her balance initially. OT then able to hand pt 3 lb weighted bar for 3 sets of 10 chest press, bicep curl, and straight arm raise. Extended rest breaks in between sets due to fatigue. 3 sets of 6 mini squats with rest breaks and encouragement to finish set. Pt reported max fatigue and requested to return to the room in similar fashion. OT discussed dc plan and discussion in conference with main barrier being wound vac. Plan for family education this week. Pt left seated in wc at end of session with alarm belt on, call bell in reach, and needs met.   Therapy Documentation Precautions:  Precautions Precautions: Fall Precaution Comments: Abdominal wound vac, abdominal precautions Restrictions Weight Bearing Restrictions: No Pain: Pain Assessment Pain Scale: 0-10 Pain Score: 0-No pain  Therapy/Group: Individual Therapy  Kaitlyn Bray 11/21/2022, 11:21 AM

## 2022-11-21 NOTE — Progress Notes (Signed)
Physical Therapy Session Note  Patient Details  Name: Kaitlyn Bray MRN: [id number] Date of Birth: 1965-10-09  Today's Date: 11/21/2022 PT Individual Time: 0800-0915 PT Individual Time Calculation (min): 75 min   Short Term Goals: Week 1:  PT Short Term Goal 1 (Week 1): STGs=LTGs secondary to ELOS  Skilled Therapeutic Interventions/Progress Updates:  Patient greeted sitting upright in wheelchair in her room and agreeable to PT treatment session. Therapist donned socks and tennis shoes for time management and energy conservation. Patient stood from wheelchair with RW and Supv- Patient ambulated to/from the bathroom with RW and Supv for safety. Patient was able to toilet with distant supv and no LOB noted. Patient wheeled to/from the day room for time management and energy conservation.   Patient trialed gait training with a rollator in order to improve independence with functional mobility. Patient gait trained x180' and 2 x 165' with rollator and supv for safety- VC for decreased BUE use in order to decreased soreness in arms throughout gait. Patient practiced parking her rollator against the wall, locking the brakes and taking a seated rest break in between gait trials.   Patient stood on airex foam while playing the piano in order to improve standing tolerance and overall endurance/activity tolerance and balance- Patient tolerated standing x3.5 minutes and x5.5 minutes while playing various songs on the piano. Patient required an extended seated rest break in between standing trials secondary to fatigue.   Patient returned to her room and left sitting upright in wheelchair with tray table in front, call bell within reach and all needs met.    Therapy Documentation Precautions:  Precautions Precautions: Fall Precaution Comments: Abdominal wound vac, abdominal precautions Restrictions Weight Bearing Restrictions: No  Pain: No/Denies pain.    Therapy/Group: Individual  Therapy  [name] 11/21/2022, 7:49 AM

## 2022-11-21 NOTE — Patient Care Conference (Signed)
Inpatient RehabilitationTeam Conference and Plan of Care Update Date: 11/21/2022   Time: 10:53 AM    Patient Name: Kaitlyn Bray      Medical Record Number: [id number]  Date of Birth: 1966-04-28 Sex: Female         Room/Bed: 4W02C/4W02C-01 Payor Info: Payor: Monia Pouch / Plan: [employer] [facility]  / Product Type: *No Product type* /    Admit Date/Time:  11/16/2022  3:43 PM  Primary Diagnosis:  Debility  Hospital Problems: Principal Problem:   Debility    Expected Discharge Date: Expected Discharge Date:  (TBD)  Team Members Present: Physician leading conference: Dr. Elijah Birk Social Worker Present: Cecile Sheerer, LCSWA Nurse Present: Vedia Pereyra, RN PT Present: Amedeo Plenty, PT OT Present: Kearney Hard, OT PPS Coordinator present : Fae Pippin, SLP     Current Status/Progress Goal Weekly Team Focus  Bowel/Bladder   pt continent of b/b with some urgency   Remain continent   Assist with needs/transfers qshift and prn    Swallow/Nutrition/ Hydration               ADL's   Supervision/CGA for transfers and BADL tasks   supervision/mod I   barriers with wound care/wound vac, self-care retraining, activity tolerance, general strengthening    Mobility   Supv with bed mobility; SBA/Supv with sit/stands, transfers, gait with RW; CGA/MinA for stair mobility   Supv/ModI with LRAD  Barriers: Wound vac. Continue to progress patient toward independence with functional mobility with LRAD (may trial a rollator)- Sit/stands, transfers, gait, dynamic stability without the use of UE support, etc.    Communication                Safety/Cognition/ Behavioral Observations               Pain   pt c/o back pain, prn oxy given   <3 pain score   Assess qshift and prn    Skin   wound vac to the mid abdomen   maintain skin integrity  Assess qshift and prn      Discharge Planning:  D/c to home with support from her husband and son. Pt will need wound vac at  discharge. Challenges with obtaining HH at this time. SW will confirm there are no barriers to discharge.   Team Discussion: Debility. Wound vac changes [id number].  Takes pain medication prior to wound vac changes. Having difficulty getting home health with nursing. Does well with rollator. Working on activity tolerance and strength training.  Patient on target to meet rehab goals: TBD.  Need to know if going home with wound vac.   *See Care Plan and progress notes for long and short-term goals.   Revisions to Treatment Plan:  Wound vac vs dressing changes.  Monitor labs/VS.  Teaching Needs: Medications, safety, self care, gait/transfer training, skin/wound care, etc.   Current Barriers to Discharge: Decreased caregiver support and Wound care  Possible Resolutions to Barriers: Family education Obtain home health with nursing Order recommended DME if needed.        Medical Summary Current Status: medically complicated by complex post-op wound with vac, hyperglycemia with diabetes, pain management  Barriers to Discharge: Complicated Wound;Morbid Obesity;Medical stability;Self-care education;Uncontrolled Pain  Barriers to Discharge Comments: pain control, wound vac changes and duration Possible Resolutions to [employer]: working with WOC and surgical team on wound care planning at [employer], monitorring for infection, encouraging POs for healing, medication control for pain   Continued Need for Acute Rehabilitation  Level of Care: The patient requires daily medical management by a physician with specialized training in physical medicine and rehabilitation for the following reasons: Direction of a multidisciplinary physical rehabilitation program to maximize functional independence : Yes Medical management of patient stability for increased activity during participation in an intensive rehabilitation regime.: Yes Analysis of laboratory values and/or radiology reports with any  subsequent need for medication adjustment and/or medical intervention. : Yes   I attest that I was present, lead the team conference, and concur with the assessment and plan of the team.   Jearld Adjutant 11/21/2022, 3:45 PM

## 2022-11-22 LAB — GLUCOSE, CAPILLARY
Glucose-Capillary: 116 mg/dL — ABNORMAL HIGH (ref 70–99)
Glucose-Capillary: 104 mg/dL — ABNORMAL HIGH (ref 70–99)
Glucose-Capillary: 101 mg/dL — ABNORMAL HIGH (ref 70–99)
Glucose-Capillary: 127 mg/dL — ABNORMAL HIGH (ref 70–99)

## 2022-11-22 MED ORDER — ACETAMINOPHEN 325 MG PO TABS: 325.0000 mg | ORAL_TABLET | ORAL | Status: AC | PRN

## 2022-11-22 NOTE — Progress Notes (Signed)
PROGRESS NOTE   Subjective/Complaints:  No events overnight.  No acute complaints. Had vac change this AM, tolerating well. Likes using rollator with vac on chair. Vitals stable  ROS:  + Pain - mild over wound with vac changes Denies fevers, chills, N/V, constipation, diarrhea, SOB, cough, chest pain, new weakness or paraesthesias.    Objective:   No results found. Recent Labs    11/20/22 0646  WBC 9.8  HGB 9.1*  HCT 30.6*  PLT 403*    Recent Labs    11/20/22 0646  NA 135  K 4.2  CL 99  CO2 24  GLUCOSE 121*  BUN 12  CREATININE 0.60  CALCIUM 8.7*     Intake/Output Summary (Last 24 hours) at 11/22/2022 0903 Last data filed at 11/22/2022 0747 Gross per 24 hour  Intake 538 ml  Output 25 ml  Net 513 ml         Physical Exam: Vital Signs Blood pressure 132/83, pulse 85, temperature 98.5 F (36.9 C), resp. rate 16, height 5\' 5"  (1.651 m), weight 99.9 kg, SpO2 97 %.  General: No acute distress. Ambulating with PT. +Obese Mood and affect are appropriate Heart: Regular rate and rhythm no rubs murmurs or extra sounds Lungs: Clear to auscultation, breathing unlabored, no rales or wheezes Abdomen: Positive bowel sounds, normoactive, none distended, mildline wound vac with excellent seal and clear serous output in canister - 0 cc   Extremities: No clubbing, cyanosis, or edema Skin no drainage from former JP sites, wound vac on abd  Neurologic: Awake, alert, and oriented x 3.  Appropriate cognition., motor strength is 5/5 in bilateral deltoid, bicep, tricep, grip, hip flexor, knee extensors, ankle dorsiflexor and plantar flexor - improved Sensory exam normal sensation to light touch and proprioception in bilateral upper mildly diminished lower extremities Cerebellar exam normal finger to nose to finger Musculoskeletal: Full range of motion in all 4 extremities. No joint swelling    Assessment/Plan: 1.  Functional deficits which require 3+ hours per day of interdisciplinary therapy in a comprehensive inpatient rehab setting. Physiatrist is providing close team supervision and 24 hour management of active medical problems listed below. Physiatrist and rehab team continue to assess barriers to discharge/monitor patient progress toward functional and medical goals  Care Tool:  Bathing    Body parts bathed by patient: Right arm, Left arm, Chest, Abdomen, Right upper leg, Left upper leg, Face   Body parts bathed by helper: Front perineal area, Buttocks, Left lower leg, Right lower leg     Bathing assist Assist Level: Moderate Assistance - Patient 50 - 74%     Upper Body Dressing/Undressing Upper body dressing   What is the patient wearing?: Pull over shirt    Upper body assist Assist Level: Contact Guard/Touching assist    Lower Body Dressing/Undressing Lower body dressing      What is the patient wearing?: Pants, Underwear/pull up     Lower body assist Assist for lower body dressing: Maximal Assistance - Patient 25 - 49%     Toileting Toileting    Toileting assist Assist for toileting: Moderate Assistance - Patient 50 - 74%     Transfers Chair/bed transfer  Transfers assist     Chair/bed transfer assist level: Minimal Assistance - Patient > 75%     Locomotion Ambulation   Ambulation assist      Assist level: Minimal Assistance - Patient > 75% Assistive device: Walker-rolling Max distance: 180   Walk 10 feet activity   Assist     Assist level: Minimal Assistance - Patient > 75% Assistive device: Walker-rolling   Walk 50 feet activity   Assist    Assist level: Minimal Assistance - Patient > 75% Assistive device: Walker-rolling    Walk 150 feet activity   Assist    Assist level: Minimal Assistance - Patient > 75% Assistive device: Walker-rolling    Walk 10 feet on uneven surface  activity   Assist     Assist level: Minimal  Assistance - Patient > 75% Assistive device: Walker-rolling   Wheelchair     Assist Is the patient using a wheelchair?: No             Wheelchair 50 feet with 2 turns activity    Assist            Wheelchair 150 feet activity     Assist          Blood pressure 132/83, pulse 85, temperature 98.5 F (36.9 C), resp. rate 16, height 5\' 5"  (1.651 m), weight 99.9 kg, SpO2 97 %.    Medical Problem List and Plan: 1. Functional deficits secondary to debility secondary to ventral hernia repair 10/23/2022 complicated by bowel perforation, sepsis and peritonitis status post exploratory laparotomy 6/16 and washout on 618, 6/20 with closing of abdomen 6/22.  Continue wound VAC per general surgery             -patient may not shower             -ELOS/Goals: 14-18d - 7/12 DC date (pending HH coordination for vac changes)  -continue CIR  - 7/8:SPV to CGA for all ADLs. Some mild pain limiting. Supervision bed mobility and standby assist.    - 7/10: May have HH for vac changes! Plan for DC Friday after family training  2.  Antithrombotics: -DVT/anticoagulation:  Pharmaceutical: Lovenox 40mg  every day - Ambulating >100 feet consistently, independently; DC lovenox             -antiplatelet therapy: N/A  3. Pain Management: Neurontin 400 mg 3 times daily, oxycodone 5mg  q4h as needed  -7/6-7/8 Pain well-controlled on current regimen, monitor  4. Mood/Behavior/Sleep: Provide emotional support             -antipsychotic agents: N/A 5. Neuropsych/cognition: This patient is capable of making decisions on her own behalf. 6. Skin/Wound Care: Routine skin checks   - VAC changes with WCN M/W/F at 8 am; therapies notified -11/19/22 pt very concerned with wound vac and d/c plans-- advised they would discuss this during the week, whether she will go home with it or not, and what that would look like. Works in a daycare.  - will discuss at teams this week 7-9: Per Dr. Dossie Der, intent to  manage with wound VAC for several months.  Patient home health benefits are very limited, will look into options as he does not offer wound VAC changes in his clinic in our wound clinic generally cannot accommodate 3 times weekly VAC changes   7. Fluids/Electrolytes/Nutrition: Routine in and outs with follow-up chemistries - K 3.4; k dur 20 meq BID for 3 days, recheck Monday. Good PO intakes.  Ascorbic acid, B12  for wound healing. -11/18/22 weekly labs ordered for Monday - 7/8: Labs stable  8.  ID.  Continue Augmentin 875-125mg  BID/Diflucan 400mg  QD as per Dr. Algis Liming of infectious disease.  Await plan by infectious disease on duration of antibiotic -  - Infectious disease was consulted and recommend one month of Augmentin and fluconazole with repeat CT scan to make sure that all fluid collections are resolving prior to discontinuing antibiotics.   -7/5 Discontinue PICC line  9.  Acute blood loss anemia.  Follow-up CBC  - Stable 8; monitor  10.  Decreased nutritional storage.  TPN discontinued.  Continue regular diet - Adequate POs; monitor, encourage protein supplementation for wound healing - Excellent POS  11.  Obesity.  BMI 36.91.  Dietary follow-up 12.  Diabetes mellitus.  Hemoglobin A1c 8.5.  Currently on sliding scale insulin.   Patient on Glucophage 500 mg twice daily prior to admission.  Resume as needed -11/19/22 No CBGs since 7/4, no monitoring order placed; will have CBGs checked TID AC+QHS and add SSI back on-- appears it never got released. Might be able to restart Metformin, but defer to weekday team -7/8: resume metformin - improved Recent Labs    11/21/22 1657 11/21/22 2059 11/22/22 0604  GLUCAP 155* 110* 116*      13.  Hypertension.  Cozaar 25-50 mg daily and hydrochlorothiazide 6.25-12.5 mg daily. -Mildly hypertensive, given significant debility would allow to run a little high to prevent orthostasis; monitor -7/6-7/24 BPs doing well, monitor  Vitals:   11/18/22  1851 11/19/22 0438 11/19/22 1323 11/19/22 1937  BP: 138/68 130/78 124/70 128/74   11/20/22 0412 11/20/22 1553 11/20/22 1942 11/21/22 0401  BP: 129/73 128/81 118/75 139/72   11/21/22 1437 11/21/22 1506 11/21/22 1929 11/22/22 0356  BP: 127/69 137/81 135/69 132/83    14. 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 Will add these on today, 11/19/22; 2 sprays each nostril daily for flonase, and loratidine 10mg  daily. Monitor.     LOS: 6 days A FACE TO FACE EVALUATION WAS PERFORMED  Angelina Sheriff 11/22/2022, 9:03 AM

## 2022-11-22 NOTE — Progress Notes (Signed)
CCS will plan to see patient Friday with WOC RN to evaluate wound and confirm outpatient surgical follow up.   Hosie Spangle, PA-C [facility] Please see Amion for pager number during day hours 7:00am-4:30pm

## 2022-11-22 NOTE — Progress Notes (Signed)
Patient ID: Kaitlyn Bray, female   DOB: 1965-06-05, 57 y.o.   MRN: [id number]  SW received updates from Lynette/[facility] reporting no co-pays for [facility]. Will need orders in Epic for wound care and HH needs.   SW spoke with pt husband to inform on d/c date, and discuss family edu. Will be here on Friday 10am-12pm  SW sent order to Tracy[facility] for wound vac. SW informed likely d/c on Friday. Reports he will submit auth today and confirm with SW when approved.   Cecile Sheerer, MSW, LCSWA Office: [phone] Cell: [phone] Fax: [phone]

## 2022-11-22 NOTE — Progress Notes (Signed)
Physical Therapy Session Note  Patient Details  Name: Kaitlyn Bray MRN: [id number] Date of Birth: May 10, 1966  Today's Date: 11/22/2022 PT Individual Time: 0900-1010 PT Individual Time Calculation (min): 70 min   Short Term Goals: Week 1:  PT Short Term Goal 1 (Week 1): STGs=LTGs secondary to ELOS  Skilled Therapeutic Interventions/Progress Updates:    Chart reviewed and pt agreeable to therapy. Pt received semi-reclined in bed with no c/o pain. Also of note, pt confirms that plan is for family member to fix current ramp or to remove it and replace rails around 2 steps underneath. In preparation for d/c planning, PT asked about other entrances to house. Pt confirmed kitchen entrance with 3-4 steps and L rail ascending. Session focused on continued stair training for general strengthening and endurance training in place of seated endurance activities as well as stair navigation in preparation of possible steps for home entry and general community reintegration. Pt initiated session with amb of 250 ft to therapy using S + rollator. Pt then completed blocked practice of 6" stair navigation. Pt initially completed rounds of 4 steps with B rails + close S. Pt then attempted L ascending rail only and required CGA. Pt educated on side stepping and pt able to complete multiple rounds of 4 steps with L ascending rail using CGA and fading to S. Pt then returned to room and WC with amb using S + rollator. Pt also practiced amb of 26ft for 3 rounds using HHA + no AD. At end of session, pt was left seated in [facility] with alarm engaged, nurse call bell and all needs in reach.     Therapy Documentation Precautions:  Precautions Precautions: Fall Precaution Comments: Abdominal wound vac, abdominal precautions Restrictions Weight Bearing Restrictions: No General:       Therapy/Group: Individual Therapy  Dionne Milo, PT, DPT 11/22/2022, 11:22 AM

## 2022-11-22 NOTE — Consult Note (Signed)
WOC Nurse wound follow up Wound type: full thickness postop midline  Measurement: see note 11/20/2022  Wound bed: mesh in wound bed, surrounding tissue pink and moist, undermining noted at distal aspect of wound  Drainage (amount, consistency, odor) 350 ccs yellow effluent in cannister at this visit  Periwound: intact  Dressing procedure/placement/frequency: Removed old NPWT dressing Cleansed wound with normal saline Filled wound with 1 piece Mepitel to cover mesh,  3 pieces white foam, 1  piece black foam Sealed NPWT dressing at HG Patient received PO pain medication per bedside nurse prior to dressing change Patient tolerated procedure well  WOC nurse will continue to provide NPWT dressing changes due to the complexity of the dressing change, M-W-F. Next change Friday 11/24/2022.  Supplies in room. Cannister changed at this visit. Did order a new cannister for next change if needed.     WOC team will continue to follow as above.   Thank you,    Priscella Mann MSN, RN-BC, [employer] [phone]

## 2022-11-22 NOTE — Progress Notes (Signed)
Occupational Therapy Session Note  Patient Details  Name: Kaitlyn Bray MRN: [id number] Date of Birth: 1965/10/21  Today's Date: 11/22/2022 OT Individual Time: 4098-1191 OT Individual Time Calculation (min): 55 min    Short Term Goals: Week 1:  OT Short Term Goal 1 (Week 1): Patient to perform toileting with SBA OT Short Term Goal 2 (Week 1): Patient to perform LE dressing with CGA and use of AE PRN OT Short Term Goal 3 (Week 1): Patient to perform bathing Min assist with AE PRN OT Short Term Goal 4 (Week 1): Patient to tolerate stading for grooming tasks CGA x 3 minutes. Week 2:    Week 3:     Skilled Therapeutic Interventions/Progress Updates:    1:1 Continued to discussed home recommendations for IADLs and energy conservation, mobility around the home using the Rollator.   PT did report that she is having urgency at night and ends up using the bed pan currently. With pt's mobility improving so well- discussed setting up the room/ beside for her to perform transfer from bedside to [facility]  (at beside) with Rollator mod I and still can call nursing staff to assist with toileting (hygiene) tasks as needed. Pt able to demonstrate getting in and out of the bed mod I and then transferring EOB to [facility] mod I with wound vac attached to end of bed to the foot board and tubing laying on the Rollator (pt then doesn't have to manage it). Toilet paper roll provided at bed side. Discussed with RN, NT and Nursing Director to increase independence and help with urgency to cut down accidents.   Wrote our instructions and left outside of room.  Pt gathered clothes using reacher to obtain clothing from lower drawer with supervision to perform laundry at a later session. Pt left sitting up next to bed in w/c with phone and call bell next to her.    Therapy Documentation Precautions:  Precautions Precautions: Fall Precaution Comments: Abdominal wound vac, abdominal precautions Restrictions Weight Bearing  Restrictions: No General:   Vital Signs: Therapy Vitals Temp: 98.3 F (36.8 C) Temp Source: Oral Pulse Rate: 98 Resp: 16 BP: 135/77 Patient Position (if appropriate): Sitting Oxygen Therapy SpO2: 97 % O2 Device: Room Air Pain:  No c/o pain in session but reported that throat was dry and not feeling well. She was eating applesauce and ice water - requested earlier from RN   Therapy/Group: Individual Therapy  [facility] 11/22/2022, 3:53 PM

## 2022-11-22 NOTE — Progress Notes (Signed)
Physical Therapy Session Note  Patient Details  Name: Kaitlyn Bray MRN: [id number] Date of Birth: [birth year]  Today's Date: 11/22/2022 PT Individual Time: 1130-1230 PT Individual Time Calculation (min): 60 min   Short Term Goals: Week 1:  PT Short Term Goal 1 (Week 1): STGs=LTGs secondary to ELOS  Skilled Therapeutic Interventions/Progress Updates:  Patient greeted sitting upright in her wheelchair in her room and agreeable to PT treatment session. While seated, therapist donned socks and shoes for time management. Wound vac resting on the rollator throughout treatment session. Patient stood from wheelchair with rollator and Supv with minor VC for brake management throughout treatment session. Patient gait trained from her room to laundry room in order to start her laundry, however the washing machine was occupied. After patient's seated rest break in the gym, patient ambulated back to the laundry room where she was able to load her clothes and start the washing machine all with with use of her rollator and supv from therapist. Patient continues to have difficulty with sit/stands from low surfaces without arm rests- VC for increased anterior weight shift and using a rocking motion x3 in order to gain momentum for standing.   Patient completed standing therex with 2.5# donned to each ankle in the // bars in order to increase BLE strength and ROM for improved functional mobility-  -Alternating marches, 2 x 10 each LE -Heel raises, 2 x 10  -Mini-squats, 2 x 10  Patient required two seated rest breaks throughout standing therex secondary to fatigue. VC and demonstration throughout for improved mechanics with each activity.   Patient gait trained back to her room with rollator and distant supv for safety. VC for decreased UE use with good improvements noted, however unable to sustain as she fatigued. Patient left sitting upright in wheelchair with tray table in front with her lunch, call bell within  reach and all needs met.    Therapy Documentation Precautions:  Precautions Precautions: Fall Precaution Comments: Abdominal wound vac, abdominal precautions Restrictions Weight Bearing Restrictions: No  Pain: No/Denies pain.    Therapy/Group: Individual Therapy  Nimrit Kehres 11/22/2022, 11:13 AM

## 2022-11-23 ENCOUNTER — Other Ambulatory Visit: Payer: Self-pay

## 2022-11-23 ENCOUNTER — Other Ambulatory Visit (HOSPITAL_COMMUNITY): Payer: Self-pay

## 2022-11-23 ENCOUNTER — Encounter (HOSPITAL_COMMUNITY): Payer: Self-pay

## 2022-11-23 ENCOUNTER — Ambulatory Visit: Payer: 59

## 2022-11-23 LAB — GLUCOSE, CAPILLARY
Glucose-Capillary: 129 mg/dL — ABNORMAL HIGH (ref 70–99)
Glucose-Capillary: 96 mg/dL (ref 70–99)
Glucose-Capillary: 105 mg/dL — ABNORMAL HIGH (ref 70–99)
Glucose-Capillary: 133 mg/dL — ABNORMAL HIGH (ref 70–99)

## 2022-11-23 MED ORDER — MECLIZINE HCL 25 MG PO TABS
25.0000 mg | ORAL_TABLET | Freq: Three times a day (TID) | ORAL | 0 refills | Status: AC | PRN
  Filled 2022-11-23: qty 30, 10d supply, fill #0

## 2022-11-23 MED ORDER — FLUCONAZOLE 200 MG PO TABS
400.0000 mg | ORAL_TABLET | Freq: Every day | ORAL | 0 refills | Status: AC
  Filled 2022-11-23: qty 23, 12d supply, fill #0
  Filled 2022-11-23: qty 60, 30d supply, fill #0

## 2022-11-23 MED ORDER — AMOXICILLIN-POT CLAVULANATE 875-125 MG PO TABS
1.0000 | ORAL_TABLET | Freq: Two times a day (BID) | ORAL | 0 refills | Status: AC
  Filled 2022-11-23: qty 60, 30d supply, fill #0
  Filled 2022-11-23: qty 46, 23d supply, fill #0

## 2022-11-23 MED ORDER — CALCIUM POLYCARBOPHIL 625 MG PO TABS
625.0000 mg | ORAL_TABLET | Freq: Two times a day (BID) | ORAL | 0 refills | Status: DC
  Filled 2022-11-23: qty 60, 30d supply, fill #0

## 2022-11-23 MED ORDER — POLYETHYLENE GLYCOL 3350 17 G PO PACK: 17.0000 g | PACK | Freq: Every day | ORAL | Status: DC

## 2022-11-23 MED ORDER — FAMOTIDINE 20 MG PO TABS
20.0000 mg | ORAL_TABLET | Freq: Two times a day (BID) | ORAL | 0 refills | Status: DC
  Filled 2022-11-23: qty 60, 30d supply, fill #0

## 2022-11-23 MED ORDER — SORBITOL 70 % SOLN
30.0000 mL | Freq: Once | Status: AC
  Administered 2022-11-23: 30 mL via ORAL
  Filled 2022-11-23: qty 30

## 2022-11-23 MED ORDER — OXYCODONE HCL 5 MG PO TABS
5.0000 mg | ORAL_TABLET | ORAL | 0 refills | Status: DC | PRN
  Filled 2022-11-23: qty 30, 5d supply, fill #0

## 2022-11-23 MED ORDER — METFORMIN HCL ER 500 MG PO TB24
500.0000 mg | ORAL_TABLET | Freq: Two times a day (BID) | ORAL | 0 refills | Status: DC
  Filled 2022-11-23: qty 60, 30d supply, fill #0

## 2022-11-23 MED ORDER — LORATADINE 10 MG PO TABS
10.0000 mg | ORAL_TABLET | Freq: Every day | ORAL | 0 refills | Status: DC
  Filled 2022-11-23: qty 100, 100d supply, fill #0

## 2022-11-23 MED ORDER — GABAPENTIN 400 MG PO CAPS
400.0000 mg | ORAL_CAPSULE | Freq: Three times a day (TID) | ORAL | 0 refills | Status: DC
  Filled 2022-11-23: qty 90, 30d supply, fill #0

## 2022-11-23 MED ORDER — POLYETHYLENE GLYCOL 3350 17 G PO PACK
17.0000 g | PACK | Freq: Every day | ORAL | Status: DC
  Administered 2022-11-23: 17 g via ORAL
  Filled 2022-11-23 (×2): qty 1

## 2022-11-23 NOTE — Progress Notes (Addendum)
Patient ID: Kaitlyn Bray, female   DOB: 05-14-1966, 57 y.o.   MRN: [id number]  SW received message from Tracy/[employer] reporting pt insurance not in network witn insurance.   SW called insurance plan to get DME vendors. List provided through insurance were not appropriate vendors for services needed.   SW spoke with James[facility] to discuss if in network with insurance. Reports will check and follow-up with SW.  *SW received updates from James[employer] reporting that in network with plan. Will have Walt/liaison follow-up with SW.  SW returned phone call to Lorelle Gibbs to discuss received fax from [place] and will fax once signed. Will aim for wound vac to be delivered today.   SW faxed Rx for wound vac to [facility] [phone].   SW updated pt husband and pt to inform on likely discharge to home tomorrow after family edu tomorrow. MD will confirm.   Cecile Sheerer, MSW, LCSWA Office: [phone] Cell: [phone] Fax: [phone]

## 2022-11-23 NOTE — Progress Notes (Addendum)
Inpatient Rehabilitation Discharge Medication Review by a Pharmacist  A complete drug regimen review was completed for this patient to identify any potential clinically significant medication issues.  High Risk Drug Classes Is patient taking? Indication by Medication  Antipsychotic No   Anticoagulant No   Antibiotic Yes Augmentin, Fluconazole - abdominal wound infection  Opioid Yes PRN Oxycodone - moderate pain  Antiplatelet No   Hypoglycemics/insulin Yes Metformin ER - glucose control  Vasoactive Medication Yes Losartan, hydrochlorothiazide - hypertension  Chemotherapy No   Other Yes Famotidine - GERD Gabapentin - pain Fluticasone, Loratadine - ear fullness Polycarbophil, miralax - laxatives Vitamin B-12, Vitamin C, Vitamin D, Zinc - supplements  PRNs; Acetaminophen - mild pain     Type of Medication Issue Identified Description of Issue Recommendation(s)  Drug Interaction(s) (clinically significant)     Duplicate Therapy     Allergy     No Medication Administration End Date     Incorrect Dose     Additional Drug Therapy Needed     Significant med changes from prior encounter (inform family/care partners about these prior to discharge). PTA percocet changed to oxycodone. Communicate changes with patient/family prior to discharge.  Other       Clinically significant medication issues were identified that warrant physician communication and completion of prescribed/recommended actions by midnight of the next day:  Yes  Name of provider notified for urgent issues identified:  Harvel Ricks, PA-C  Provider Method of Notification: secure chat  Pharmacist comments:   - Augmentin and Fluconazole prescriptions extended from 23 to 30 days at discharge. Planning follow up with ID as outpatient to determine length of therapy. - Fluticasone not yet ordered for discharge.  Continue if indicated. - need to add parameters to discharge Losartan-HCT orders if to use dosing  range.  Time spent performing this drug regimen review (minutes):  20   Kaitlyn Bray, [place] 11/23/2022 4:59 PM

## 2022-11-23 NOTE — Progress Notes (Signed)
Physical Therapy Session Note  Patient Details  Name: Kaitlyn Bray MRN: [id number] Date of Birth: April 18, 1966  Today's Date: 11/23/2022 PT Individual Time: 0900-1000;200-315 PT Individual Time Calculation (min): 60 min ; 75 min  Short Term Goals: Week 1:  PT Short Term Goal 1 (Week 1): STGs=LTGs secondary to ELOS  Skilled Therapeutic Interventions/Progress Updates:     Session 1:  Pt was received sitting in wheelchair. No complaints of pain. Agreeable to PT tx.  Theract: pt requests to have a washup after using the bathroom. She was upset someone brought her her wet laundry in a bag and didn't put them in the dryer. Gt to bathroom with rollator with PT SBA; Toileting hygiene and doffing lower body clothing garments with PT S. Standing sinkside to wash up S; changing clothes with PT SBA. Gait with rollator x 102 ft with PT S to laundry room with pt able to independently put clothes into washer but pt unable to set settings due to difficulty with reaching.  Therex: Gait with rollator x 280 ft with S; STS from chair x 15 with S; standing double toe raise x 20; standing double heel raise x 20; seated windshield wiper x 20; standing shoulder flex/ext x 20; ISO LAQ x 20 with ankle pump x 20; standing balance with  narrow base of support with arms crossed 3x30 sec; standing unilat hip flex x 20 holding rollator. Pt limited by fatigue.  Pt left in wheelchair; seat alarm on; all needs within reach. No complaints of pain.   Session 2: Pt was received from other staff while sitting in wheelchair. Transported back to her room to leave the wheelchair and get her rollator. Pt agreeable to PT tx. No complaints of pain.  Gait training: Gait training with rollator x 1,252 ft with PT S and verbal cues to decrease increased UE pressure on rollator with arms, for stride, for standing rest breaks as needed. Pt noted to hold rollator tighter as she fatigued and need for taking standing rest breaks after 900  ft.  Therex: Therex performed supine at patient's request from all therapies today. Performed in her room. Sit to supine MI; quad sets x 10; hamstring digs x 20 with 2-3 sec holds; SLR x 20; SLR/hip abduction/adduction combo x 20; bridge x 20; pillow squeeze x 20; shoulder extension iso with elbows flexed and extended x 20 each; glute set x 20; heel slides x 20; SAQ unilat and bil x 20 each; clam shell x 20. All performed to increase strengthening to assist with weightbearing activities and transfers.  Pt left in bed; bed alarm on; call light and all needs within reach. No complaints of pain.  Therapy Documentation Precautions:  Precautions Precautions: Fall Precaution Comments: Abdominal wound vac, abdominal precautions Restrictions Weight Bearing Restrictions: No      Therapy/Group: Individual Therapy  Luna Fuse 11/23/2022, 7:17 AM

## 2022-11-23 NOTE — Progress Notes (Signed)
PROGRESS NOTE   Subjective/Complaints: Constipated: miralax and sorbitol ordered C/o pain on lateral aspect of right foot- patient feels it is from wearing tight shoe, discussed keeping shoes off today  ROS:  + Pain - mild over wound with vac changes Denies fevers, chills, N/V, constipation, diarrhea, SOB, cough, chest pain, new weakness or paraesthesias.  +pain on lateral aspect of right foot  Objective:   No results found. No results for input(s): "WBC", "HGB", "HCT", "PLT" in the last 72 hours. No results for input(s): "NA", "K", "CL", "CO2", "GLUCOSE", "BUN", "CREATININE", "CALCIUM" in the last 72 hours.  Intake/Output Summary (Last 24 hours) at 11/23/2022 1506 Last data filed at 11/23/2022 0842 Gross per 24 hour  Intake 236 ml  Output 100 ml  Net 136 ml        Physical Exam: Vital Signs Blood pressure 126/71, pulse 96, temperature 99 F (37.2 C), temperature source Oral, resp. rate 16, height 5\' 5"  (1.651 m), weight 99.9 kg, SpO2 99%.  General: No acute distress. Ambulating with PT. +Obese, BMI 36.65 Mood and affect are appropriate Heart: Regular rate and rhythm no rubs murmurs or extra sounds Lungs: Clear to auscultation, breathing unlabored, no rales or wheezes Abdomen: Positive bowel sounds, normoactive, none distended, mildline wound vac with excellent seal and clear serous output in canister - 0 cc   Extremities: No clubbing, cyanosis, or edema Skin no drainage from former JP sites, wound vac on abd  Neurologic: Awake, alert, and oriented x 3.  Appropriate cognition., motor strength is 5/5 in bilateral deltoid, bicep, tricep, grip, hip flexor, knee extensors, ankle dorsiflexor and plantar flexor - improved Sensory exam normal sensation to light touch and proprioception in bilateral upper mildly diminished lower extremities Cerebellar exam normal finger to nose to finger Musculoskeletal: Full range of motion in  all 4 extremities. No joint swelling    Assessment/Plan: 1. Functional deficits which require 3+ hours per day of interdisciplinary therapy in a comprehensive inpatient rehab setting. Physiatrist is providing close team supervision and 24 hour management of active medical problems listed below. Physiatrist and rehab team continue to assess barriers to discharge/monitor patient progress toward functional and medical goals  Care Tool:  Bathing    Body parts bathed by patient: Right arm, Left arm, Chest, Abdomen, Right upper leg, Left upper leg, Face   Body parts bathed by helper: Front perineal area, Buttocks, Left lower leg, Right lower leg     Bathing assist Assist Level: Moderate Assistance - Patient 50 - 74%     Upper Body Dressing/Undressing Upper body dressing   What is the patient wearing?: Pull over shirt    Upper body assist Assist Level: Contact Guard/Touching assist    Lower Body Dressing/Undressing Lower body dressing      What is the patient wearing?: Pants, Underwear/pull up     Lower body assist Assist for lower body dressing: Maximal Assistance - Patient 25 - 49%     Toileting Toileting    Toileting assist Assist for toileting: Moderate Assistance - Patient 50 - 74%     Transfers Chair/bed transfer  Transfers assist     Chair/bed transfer assist level: Minimal Assistance - Patient >  75%     Locomotion Ambulation   Ambulation assist      Assist level: Minimal Assistance - Patient > 75% Assistive device: Walker-rolling Max distance: 180   Walk 10 feet activity   Assist     Assist level: Minimal Assistance - Patient > 75% Assistive device: Walker-rolling   Walk 50 feet activity   Assist    Assist level: Minimal Assistance - Patient > 75% Assistive device: Walker-rolling    Walk 150 feet activity   Assist    Assist level: Minimal Assistance - Patient > 75% Assistive device: Walker-rolling    Walk 10 feet on uneven  surface  activity   Assist     Assist level: Minimal Assistance - Patient > 75% Assistive device: Walker-rolling   Wheelchair     Assist Is the patient using a wheelchair?: No             Wheelchair 50 feet with 2 turns activity    Assist            Wheelchair 150 feet activity     Assist          Blood pressure 126/71, pulse 96, temperature 99 F (37.2 C), temperature source Oral, resp. rate 16, height 5\' 5"  (1.651 m), weight 99.9 kg, SpO2 99%.    Medical Problem List and Plan: 1. Functional deficits secondary to debility secondary to ventral hernia repair 10/23/2022 complicated by bowel perforation, sepsis and peritonitis status post exploratory laparotomy 6/16 and washout on 618, 6/20 with closing of abdomen 6/22.  Continue wound VAC per general surgery             -patient may not shower             -ELOS/Goals: 14-18d - 7/12 DC date (pending HH coordination for vac changes)  -continue CIR  - 7/8:SPV to CGA for all ADLs. Some mild pain limiting. Supervision bed mobility and standby assist.    - 7/10: May have HH for vac changes! Plan for DC Friday after family training  2.  Antithrombotics: -DVT/anticoagulation:  Pharmaceutical: Lovenox 40mg  every day - Ambulating >100 feet consistently, independently; DC lovenox             -antiplatelet therapy: N/A  3. Pain Management: Neurontin 400 mg 3 times daily, oxycodone 5mg  q4h as needed  -7/6-7/8 Pain well-controlled on current regimen, monitor  4. Mood/Behavior/Sleep: Provide emotional support             -antipsychotic agents: N/A 5. Neuropsych/cognition: This patient is capable of making decisions on her own behalf. 6. Skin/Wound Care: Routine skin checks   - VAC changes with WCN M/W/F at 8 am; therapies notified -11/19/22 pt very concerned with wound vac and d/c plans-- advised they would discuss this during the week, whether she will go home with it or not, and what that would look like. Works in a  daycare.  - will discuss at teams this week 7-9: Per Dr. Dossie Der, intent to manage with wound VAC for several months.  Patient home health benefits are very limited, will look into options as he does not offer wound VAC changes in his clinic in our wound clinic generally cannot accommodate 3 times weekly VAC changes   7. Fluids/Electrolytes/Nutrition: Routine in and outs with follow-up chemistries - K 3.4; k dur 20 meq BID for 3 days, recheck Monday. Good PO intakes.  Ascorbic acid, B12 for wound healing. -11/18/22 weekly labs ordered for Monday - 7/8: Labs stable  8.  ID.  Continue Augmentin 875-125mg  BID/Diflucan 400mg  QD as per Dr. Algis Liming of infectious disease.  Await plan by infectious disease on duration of antibiotic -  - Infectious disease was consulted and recommend one month of Augmentin and fluconazole with repeat CT scan to make sure that all fluid collections are resolving prior to discontinuing antibiotics.   -7/5 Discontinue PICC line  9.  Acute blood loss anemia.  Follow-up CBC  - Stable 8; monitor  10.  Decreased nutritional storage.  TPN discontinued.  continue regular diet - Adequate POs; monitor, encourage protein supplementation for wound healing - Excellent POS  11.  Obesity.  BMI 36.91.  Dietary follow-up 12.  Diabetes mellitus.  Hemoglobin A1c 8.5.  Currently on sliding scale insulin.   Patient on Glucophage 500 mg twice daily prior to admission.  Resume as needed -11/19/22 No CBGs since 7/4, no monitoring order placed; will have CBGs checked TID AC+QHS and add SSI back on-- appears it never got released. Might be able to restart Metformin, but defer to weekday team Continue metformin Recent Labs    11/22/22 2119 11/23/22 0627 11/23/22 1134  GLUCAP 127* 129* 96     13.  Hypertension. continue Cozaar 25-50 mg daily and hydrochlorothiazide 6.25-12.5 mg daily. -Mildly hypertensive, given significant debility would allow to run a little high to prevent  orthostasis; monitor   Vitals:   11/20/22 0412 11/20/22 1553 11/20/22 1942 11/21/22 0401  BP: 129/73 128/81 118/75 139/72   11/21/22 1437 11/21/22 1506 11/21/22 1929 11/22/22 0356  BP: 127/69 137/81 135/69 132/83   11/22/22 1440 11/22/22 2050 11/23/22 0507 11/23/22 1334  BP: 135/77 122/75 127/72 126/71    14. 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 Will add these on today, 11/19/22; 2 sprays each nostril daily for flonase, and loratidine 10mg  daily. Monitor.   15. Pain along lateral aspect of right foot: kpad ordered  16. Constipation: miralax and sorbitol ordered    LOS: 7 days A FACE TO FACE EVALUATION WAS PERFORMED  Clint Bolder P Kaitlyn Bray 11/23/2022, 3:06 PM

## 2022-11-23 NOTE — Progress Notes (Signed)
Occupational Therapy Session Note  Patient Details  Name: Kaitlyn Bray MRN: [id number] Date of Birth: [birth year]  Today's Date: 11/23/2022 OT Individual Time: 7846-9629 OT Individual Time Calculation (min): 42 min    Short Term Goals: Week 1:  OT Short Term Goal 1 (Week 1): Patient to perform toileting with SBA OT Short Term Goal 2 (Week 1): Patient to perform LE dressing with CGA and use of AE PRN OT Short Term Goal 3 (Week 1): Patient to perform bathing Min assist with AE PRN OT Short Term Goal 4 (Week 1): Patient to tolerate stading for grooming tasks CGA x 3 minutes.     Skilled Therapeutic Interventions/Progress Updates:    Pt received sitting in her wheelchair talking on the phone.  Pt had no c/o pain and was concerned about possible d/c tomorrow 11/23/2022.  Pt stated she it had been a while since her last BM.  OT spoke with nurse and nurse going to administer sorbitol.  Pt receptive to OT session. Pt laundry was placed in dryer and pt requested to check but clothes were still wet at that time. Pt taken to gym. UE strengthening exercises completed while seated on mat using 3lb weighted dowel c(hest press and bicep curls 3x10).  Stepping activity using foam balance board implemented to help increase dynamic standing balance 2x10.  Intermittent use of bilateral UE support required from rollator to maintain balance. Educated pt on widening base of support during activity to decrease chance of falls. Graded activity appropriately by implementing dynamic standing balance and weight shifting with bean bags and basketball hoop.  Pt able to weight shift  R<>L to grab bean bag with CGA and successfully place bag in basketball hoop 2X10.  Pt did state that she could feel a slight pull where her abdominal incision is located but did not report pain.  Activity terminated.  OTS left patient with PT for next therapy session.  Therapy Documentation Precautions:  Precautions Precautions:  Fall Precaution Comments: Abdominal wound vac, abdominal precautions Restrictions Weight Bearing Restrictions: No     Therapy/Group: Individual Therapy  Liam Graham 11/23/2022, 3:21 PM

## 2022-11-24 LAB — GLUCOSE, CAPILLARY: Glucose-Capillary: 115 mg/dL — ABNORMAL HIGH (ref 70–99)

## 2022-11-24 NOTE — Plan of Care (Signed)
  Problem: RH Balance Goal: LTG Patient will maintain dynamic standing with ADLs (OT) Description: LTG:  Patient will maintain dynamic standing balance with assist during activities of daily living (OT)  Outcome: Completed/Met   Problem: RH Grooming Goal: LTG Patient will perform grooming w/assist,cues/equip (OT) Description: LTG: Patient will perform grooming with assist, with/without cues using equipment (OT) Outcome: Completed/Met   Problem: RH Bathing Goal: LTG Patient will bathe all body parts with assist levels (OT) Description: LTG: Patient will bathe all body parts with assist levels (OT) Outcome: Completed/Met   Problem: RH Dressing Goal: LTG Patient will perform upper body dressing (OT) Description: LTG Patient will perform upper body dressing with assist, with/without cues (OT). Outcome: Completed/Met Goal: LTG Patient will perform lower body dressing w/assist (OT) Description: LTG: Patient will perform lower body dressing with assist, with/without cues in positioning using equipment (OT) Outcome: Completed/Met   Problem: RH Toileting Goal: LTG Patient will perform toileting task (3/3 steps) with assistance level (OT) Description: LTG: Patient will perform toileting task (3/3 steps) with assistance level (OT)  Outcome: Completed/Met   Problem: RH Simple Meal Prep Goal: LTG Patient will perform simple meal prep w/assist (OT) Description: LTG: Patient will perform simple meal prep with assistance, with/without cues (OT). Outcome: Completed/Met   Problem: RH Light Housekeeping Goal: LTG Patient will perform light housekeeping w/assist (OT) Description: LTG: Patient will perform light housekeeping with assistance, with/without cues (OT). Outcome: Completed/Met   Problem: RH Toilet Transfers Goal: LTG Patient will perform toilet transfers w/assist (OT) Description: LTG: Patient will perform toilet transfers with assist, with/without cues using equipment (OT) Outcome:  Completed/Met   Problem: RH Tub/Shower Transfers Goal: LTG Patient will perform tub/shower transfers w/assist (OT) Description: LTG: Patient will perform tub/shower transfers with assist, with/without cues using equipment (OT) Outcome: Completed/Met   

## 2022-11-24 NOTE — Consult Note (Addendum)
WOC Nurse wound follow up Surgical PA at the bedside to assess wound appearance.  Wound type: full thickness postop midline abd wound with exposed mesh  Wound bed: mesh/surrounding tissue pink and moist, 4 cm undermining to wound edges; 15X10X2cm Drainage (amount, consistency, odor) 100 ccs yellow drainage in cannister  Periwound: intact  Dressing procedure/placement/frequency: Removed old negative pressure wound therapy (NPWT dressing) Cleansed wound with normal saline Filled wound with 1 piece Mepitel to cover mesh, 3 pieces white foam, 1  piece black foam Patient received PO pain medication per bedside nurse prior to dressing change and tolerated procedure with minimal amt discomfort.  Pt plans to discharge this morning; Medella NPWT machine has been left at the bedside by home health.  Since it has a different dressing kit, I applied this and turned the machine on to cont suction.  Pt plans to have home health assistance for dressing changes and denies further questions.  3 black foam kits, 3 white foam kits, and 2 Mepitel left with the patient at the bedside for use after discharge; home health will need to provide additional supplies.  There was no carrying case delivered for the Medella pump and I instructed patient to call her home health nurse for further questions. Charger is in the bag for use at home. Thank-you,  Cammie Mcgee MSN, RN, CWOCN, [place], CNS [phone]

## 2022-11-24 NOTE — Progress Notes (Signed)
Patient ID: Kaitlyn Bray, female   DOB: [birth year], 57 y.o.   MRN: [id number]  Wound vac delivered to room.   Pt is cleared to discharge. SW reviewed discharge with pt and pt husband.   Cecile Sheerer, MSW, LCSWA Office: [phone] Cell: [phone] Fax: [phone]

## 2022-11-24 NOTE — Progress Notes (Signed)
Orthopedic Tech Progress Note Patient Details:  Kaitlyn Bray Oct 01, 1965 [id number]  Ortho Devices Type of Ortho Device: Abdominal binder Ortho Device/Splint Interventions: Ordered     Additional abd ordered, dropped off with RN. [name] 11/24/2022, 9:59 AM

## 2022-11-24 NOTE — Progress Notes (Addendum)
[facility] Progress Note     Subjective: CC:  Admitted to CIR 7/4 Seen with WOC RN for wound check.  States she is eating better.   Objective: Vital signs in last 24 hours: Temp:  [98 F (36.7 C)-99 F (37.2 C)] 98 F (36.7 C) (07/12 0442) Pulse Rate:  [87-96] 92 (07/12 0442) Resp:  [16-18] 18 (07/12 0442) BP: (124-126)/(62-82) 124/82 (07/12 0442) SpO2:  [98 %-99 %] 99 % (07/12 0442) Last BM Date : 11/23/22  Intake/Output from previous day: 07/11 0701 - 07/12 0700 In: 236 [P.O.:236] Out: 150 [Drains:150] Intake/Output this shift: No intake/output data recorded.  PE: Gen:  Alert, NAD, pleasant  Abd: Soft, non-tender, non-distended, midline incision as below: re-dressed by WOC RN With mepitel, white foam, black foam [employer] NPWT. There is a thin layer of granular tissue forming over her intestines/bridging absorbable mesh. There is undermining inferiorly and laterally.   Skin: warm and dry, no rashes    Lab Results:  No results for input(s): "WBC", "HGB", "HCT", "PLT" in the last 72 hours. BMET No results for input(s): "NA", "K", "CL", "CO2", "GLUCOSE", "BUN", "CREATININE", "CALCIUM" in the last 72 hours. PT/INR No results for input(s): "LABPROT", "INR" in the last 72 hours. CMP     Component Value Date/Time   NA 135 11/20/2022 0646   K 4.2 11/20/2022 0646   CL 99 11/20/2022 0646   CO2 24 11/20/2022 0646   GLUCOSE 121 (H) 11/20/2022 0646   BUN 12 11/20/2022 0646   CREATININE 0.60 11/20/2022 0646   CALCIUM 8.7 (L) 11/20/2022 0646   PROT 6.6 11/16/2022 1700   ALBUMIN 1.8 (L) 11/16/2022 1700   AST 25 11/16/2022 1700   ALT 31 11/16/2022 1700   ALKPHOS 118 11/16/2022 1700   BILITOT 0.5 11/16/2022 1700   GFRNONAA >60 11/20/2022 0646   GFRAA >60 04/05/2018 1031   Lipase     Component Value Date/Time   LIPASE 21 09/15/2021 0732       Studies/Results: No results found.  Anti-infectives: Anti-infectives (From admission, onward)    Start      Dose/Rate Route Frequency Ordered Stop   11/23/22 0000  amoxicillin-clavulanate (AUGMENTIN) 875-125 MG tablet       Note to Pharmacy: change to #60 tabs per PA/maf   1 tablet Oral Every 12 hours 11/23/22 0527 12/23/22 2359   11/23/22 0000  fluconazole (DIFLUCAN) 200 MG tablet       Note to Pharmacy: change to 60 tabs per PA/maf   400 mg Oral Daily 11/23/22 0527 12/23/22 2359   11/17/22 0800  fluconazole (DIFLUCAN) tablet 400 mg        400 mg Oral Daily 11/16/22 1608     11/16/22 2000  amoxicillin-clavulanate (AUGMENTIN) 875-125 MG per tablet 1 tablet        1 tablet Oral Every 12 hours 11/16/22 1608          Assessment/Plan  57 y/o F s/p difficult robotic hernia repair 6/10 complicated by post-operative peritonitis/small bowel perforation requiring return to the OR 6/16 for ex lap, SBR, mesh excision, open abdomen. She underwent multiple washouts and then her abdomen was closed with a bridging mesh on 11/04/22.   Afebrile, VSS, tolerating PO Continue NPWT as above. Appreciate WOC RN care.  I will make sure that she has follow up with Dr. Dossie Der in our office.    LOS: 8 days   I reviewed nursing notes, hospitalist notes, last 24 h vitals and pain scores, last 48  h intake and output, last 24 h labs and trends, and last 24 h imaging results.    Kaitlyn Spangle, PA-C [facility] Please see Amion for pager number during day hours 7:00am-4:30pm

## 2022-11-24 NOTE — Progress Notes (Signed)
PROGRESS NOTE   Subjective/Complaints: No new complaints this morning Foot pain has improved  Feels ready for d/c today Able to have multiple BM yesterday  ROS:  + Pain - mild over wound with vac changes Denies fevers, chills, N/V, constipation, diarrhea, SOB, cough, chest pain, new weakness or paraesthesias.  +pain on lateral aspect of right foot  Objective:   No results found. No results for input(s): "WBC", "HGB", "HCT", "PLT" in the last 72 hours. No results for input(s): "NA", "K", "CL", "CO2", "GLUCOSE", "BUN", "CREATININE", "CALCIUM" in the last 72 hours.  Intake/Output Summary (Last 24 hours) at 11/24/2022 0940 Last data filed at 11/24/2022 0800 Gross per 24 hour  Intake --  Output 250 ml  Net -250 ml        Physical Exam: Vital Signs Blood pressure 124/82, pulse 92, temperature 98 F (36.7 C), resp. rate 18, height 5\' 5"  (1.651 m), weight 99.9 kg, SpO2 99%.  General: No acute distress. Ambulating with PT. +Obese, BMI 36.65 Mood and affect are appropriate Heart: Regular rate and rhythm no rubs murmurs or extra sounds Lungs: Clear to auscultation, breathing unlabored, no rales or wheezes Abdomen: Positive bowel sounds, normoactive, none distended, mildline wound vac with excellent seal and clear serous output in canister - 0 cc   Extremities: No clubbing, cyanosis, or edema Skin no drainage from former JP sites, wound vac on abd, thin layer of granular tissue over wound  Neurologic: Awake, alert, and oriented x 3.  Appropriate cognition., motor strength is 5/5 in bilateral deltoid, bicep, tricep, grip, hip flexor, knee extensors, ankle dorsiflexor and plantar flexor - improved Sensory exam normal sensation to light touch and proprioception in bilateral upper mildly diminished lower extremities Cerebellar exam normal finger to nose to finger Musculoskeletal: Full range of motion in all 4 extremities. No joint  swelling    Assessment/Plan: 1. Functional deficits which require 3+ hours per day of interdisciplinary therapy in a comprehensive inpatient rehab setting. Physiatrist is providing close team supervision and 24 hour management of active medical problems listed below. Physiatrist and rehab team continue to assess barriers to discharge/monitor patient progress toward functional and medical goals  Care Tool:  Bathing    Body parts bathed by patient: Right arm, Left arm, Chest, Abdomen, Right upper leg, Left upper leg, Face   Body parts bathed by helper: Front perineal area, Buttocks, Left lower leg, Right lower leg     Bathing assist Assist Level: Moderate Assistance - Patient 50 - 74%     Upper Body Dressing/Undressing Upper body dressing   What is the patient wearing?: Pull over shirt    Upper body assist Assist Level: Contact Guard/Touching assist    Lower Body Dressing/Undressing Lower body dressing      What is the patient wearing?: Pants, Underwear/pull up     Lower body assist Assist for lower body dressing: Maximal Assistance - Patient 25 - 49%     Toileting Toileting    Toileting assist Assist for toileting: Moderate Assistance - Patient 50 - 74%     Transfers Chair/bed transfer  Transfers assist     Chair/bed transfer assist level: Minimal Assistance - Patient > 75%  Locomotion Ambulation   Ambulation assist      Assist level: Minimal Assistance - Patient > 75% Assistive device: Walker-rolling Max distance: 180   Walk 10 feet activity   Assist     Assist level: Minimal Assistance - Patient > 75% Assistive device: Walker-rolling   Walk 50 feet activity   Assist    Assist level: Minimal Assistance - Patient > 75% Assistive device: Walker-rolling    Walk 150 feet activity   Assist    Assist level: Minimal Assistance - Patient > 75% Assistive device: Walker-rolling    Walk 10 feet on uneven surface  activity   Assist      Assist level: Minimal Assistance - Patient > 75% Assistive device: Walker-rolling   Wheelchair     Assist Is the patient using a wheelchair?: No             Wheelchair 50 feet with 2 turns activity    Assist            Wheelchair 150 feet activity     Assist          Blood pressure 124/82, pulse 92, temperature 98 F (36.7 C), resp. rate 18, height 5\' 5"  (1.651 m), weight 99.9 kg, SpO2 99%.    Medical Problem List and Plan: 1. Functional deficits secondary to debility secondary to ventral hernia repair 10/23/2022 complicated by bowel perforation, sepsis and peritonitis status post exploratory laparotomy 6/16 and washout on 618, 6/20 with closing of abdomen 6/22.  Continue wound VAC per general surgery             -patient may not shower             -ELOS/Goals: 14-18d - 7/12 DC date (pending HH coordination for vac changes)  -continue CIR  - 7/8:SPV to CGA for all ADLs. Some mild pain limiting. Supervision bed mobility and standby assist.    - 7/10: May have HH for vac changes! Plan for DC Friday after family training  2.  Antithrombotics: -DVT/anticoagulation:  Pharmaceutical: Lovenox 40mg  every day - Ambulating >100 feet consistently, independently; DC lovenox             -antiplatelet therapy: N/A  3. Pain Management: Neurontin 400 mg 3 times daily, oxycodone 5mg  q4h as needed  -7/6-7/8 Pain well-controlled on current regimen, monitor  4. Mood/Behavior/Sleep: Provide emotional support             -antipsychotic agents: N/A 5. Neuropsych/cognition: This patient is capable of making decisions on her own behalf. 6. Skin/Wound Care: Routine skin checks   - VAC changes with WCN M/W/F at 8 am; therapies notified -11/19/22 pt very concerned with wound vac and d/c plans-- advised they would discuss this during the week, whether she will go home with it or not, and what that would look like. Works in a daycare.  - will discuss at teams this week 7-9: Per  Dr. Dossie Der, intent to manage with wound VAC for several months.  Patient home health benefits are very limited, will look into options as he does not offer wound VAC changes in his clinic in our wound clinic generally cannot accommodate 3 times weekly VAC changes   7. Fluids/Electrolytes/Nutrition: Routine in and outs with follow-up chemistries - K 3.4; k dur 20 meq BID for 3 days, recheck Monday. Good PO intakes.  Ascorbic acid, B12 for wound healing. -11/18/22 weekly labs ordered for Monday - 7/8: Labs stable  8.  ID.  Continue Augmentin 875-125mg   BID/Diflucan 400mg  QD as per Dr. Algis Liming of infectious disease.  Await plan by infectious disease on duration of antibiotic -  - Infectious disease was consulted and recommend one month of Augmentin and fluconazole with repeat CT scan to make sure that all fluid collections are resolving prior to discontinuing antibiotics.   -7/5 Discontinue PICC line  9.  Acute blood loss anemia.  Follow-up CBC  - Stable 8; monitor  10.  Decreased nutritional storage.  TPN discontinued.  continue regular diet - Adequate POs; monitor, encourage protein supplementation for wound healing - Excellent POS  11.  Obesity.  BMI 36.91.  Dietary follow-up 12.  Diabetes mellitus.  Hemoglobin A1c 8.5.  Currently on sliding scale insulin.   Patient on Glucophage 500 mg twice daily prior to admission.  Resume as needed -11/19/22 No CBGs since 7/4, no monitoring order placed; will have CBGs checked TID AC+QHS and add SSI back on-- appears it never got released. Might be able to restart Metformin, but defer to weekday team Continue metformin Recent Labs    11/23/22 1633 11/23/22 2107 11/24/22 0603  GLUCAP 105* 133* 115*     13.  Hypertension.continue Cozaar 25-50 mg daily and hydrochlorothiazide 6.25-12.5 mg daily. -Mildly hypertensive, given significant debility would allow to run a little high to prevent orthostasis; monitor   Vitals:   11/20/22 1942 11/21/22 0401  11/21/22 1437 11/21/22 1506  BP: 118/75 139/72 127/69 137/81   11/21/22 1929 11/22/22 0356 11/22/22 1440 11/22/22 2050  BP: 135/69 132/83 135/77 122/75   11/23/22 0507 11/23/22 1334 11/23/22 1929 11/24/22 0442  BP: 127/72 126/71 124/62 124/82    14. 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 Will add these on today, 11/19/22; 2 sprays each nostril daily for flonase, and loratidine 10mg  daily. Monitor.   15. Pain along lateral aspect of right foot: kpad ordered, resolved  16. Constipation: miralax and sorbitol ordered, resolved, d/c miralax   >30 minutes spent in discharge of patient including review of medications and follow-up appointments, physical examination, and in answering all patient's questions   LOS: 8 days A FACE TO FACE EVALUATION WAS PERFORMED  Clint Bolder P Zayaan Kozak 11/24/2022, 9:40 AM

## 2022-11-24 NOTE — Progress Notes (Signed)
Inpatient Rehabilitation Care Coordinator Discharge Note   Patient Details  Name: Kaitlyn Bray MRN: [id number] Date of Birth: 1965/09/11   Discharge location: D/c to home  Length of Stay: 7 days  Discharge activity level: Mod I  Home/community participation: Limited  Patient response WU:JWJXBJ Literacy - How often do you need to have someone help you when you read instructions, pamphlets, or other written material from your doctor or pharmacy?: Never  Patient response YN:WGNFAO Isolation - How often do you feel lonely or isolated from those around you?: Never  Services provided included: MD, RD, PT, OT, SLP, RN, CM, TR, Pharmacy, Neuropsych, SW  Financial Services:  Field seismologist Utilized: Physiological scientist  Choices offered to/list presented to: patient  Follow-up services arranged:  Home Health, DME Home Health Agency: [facility] for HHPT/OT/SN    DME : [facility] for rollator; [employer] for wound vac    Patient response to transportation need: Is the patient able to respond to transportation needs?: Yes In the past 12 months, has lack of transportation kept you from medical appointments or from getting medications?: No In the past 12 months, has lack of transportation kept you from meetings, work, or from getting things needed for daily living?: No   Patient/Family verbalized understanding of follow-up arrangements:  Yes  Individual responsible for coordination of the follow-up plan: contact pt [phone]  Confirmed correct DME delivered: Kaitlyn Bray 11/24/2022    Comments (or additional information):fam edu completed  Summary of Stay    Date/Time Discharge Planning CSW  11/21/22 1006 D/c to home with support from her husband and son. Pt will need wound vac at discharge. Challenges with obtaining HH at this time. SW will confirm there are no barriers to discharge. AAC       Kaitlyn Bray Kaitlyn Bray Kaitlyn Bray

## 2022-11-24 NOTE — Progress Notes (Addendum)
Occupational Therapy Discharge Summary  Patient Details  Name: Kaitlyn Bray MRN: [id number] Date of Birth: May 12, 1966  Date of Discharge from OT service:November 24, 2022  Today's Date: 11/24/2022 OT Individual Time: 1002-1058 OT Individual Time Calculation (min): 56 min   Pt greeted sitting in wc with husband present and agreeable to OT Treatment session. Education provided regarding safe BADL performance in home environment, home modifications, DME needs, energy conservation techniques, and wound vac portability. OT brought in abdominal binder that RN gave to OT for pt. Abddominal binder still slightly too large. OT placed large ace wrap around abdomen as well for more external support with improved comfort.  Pt donned socks and shoes mod I using figure 4 position. She then was able to manage wound vac and place in bag on rollator. Pt ambulated to the gym with rollator mod I. OT went through home exercise program using red theraband and went through each exercise 10x.  Straight Arm Pulls x10 Straight Arm Raise x10 Side Arm Raise x10 Over Head Pull Down x10 Forearm Pull x10 Triceps Elbow Extension x10 Bicep Curl x10  Pt and spouse feel ready for dc. Pt ambulated back to room and left seated in wc for next therapy session.   Patient has met 10 of 10 long term goals due to improved activity tolerance, improved balance, postural control, and ability to compensate for deficits.  Patient to discharge at overall Modified Independent level.  Patient's care partner is independent to provide the necessary physical assistance at discharge for higher level iADL tasks .    Reasons goals not met: n/a  Recommendation:  Patient will benefit from ongoing skilled OT services in home health setting to continue to advance functional skills in the area of BADL and Reduce care partner burden.  Equipment: Rollator  Reasons for discharge: treatment goals met and discharge from hospital  Patient/family agrees  with progress made and goals achieved: Yes  OT Discharge Precautions/Restrictions  Precautions Precautions: Fall Precaution Comments: Abdominal wound vac, abdominal precautions Restrictions Weight Bearing Restrictions: No Pain Pain Assessment Pain Scale: 0-10 Pain Score: 0-No pain ADL ADL Eating: Independent Where Assessed-Eating: Bed level Grooming: Independent Where Assessed-Grooming: Sitting at sink Upper Body Bathing: Independent Where Assessed-Upper Body Bathing: Edge of bed Lower Body Bathing: Modified independent Where Assessed-Lower Body Bathing: Edge of bed Upper Body Dressing: Independent Where Assessed-Upper Body Dressing: Edge of bed Lower Body Dressing: Modified independent Where Assessed-Lower Body Dressing: Edge of bed Toileting: Modified independent Where Assessed-Toileting: Teacher, adult education: Engineer, agricultural Method: Proofreader: Engineer, technical sales: Unable to Engineer, technical sales: Unable to assess ADL Comments: not able to shower at this time due to wound VAC Perception  Perception: Within Functional Limits Praxis Praxis: Intact Cognition Cognition Overall Cognitive Status: Within Functional Limits for tasks assessed Arousal/Alertness: Awake/alert Orientation Level: Person;Place;Situation Person: Oriented Place: Oriented Situation: Oriented Memory: Appears intact Awareness: Appears intact Problem Solving: Appears intact Safety/Judgment: Appears intact Brief Interview for Mental Status (BIMS) Repetition of Three Words (First Attempt): 3 Temporal Orientation: Year: Correct Temporal Orientation: Month: Accurate within 5 days Temporal Orientation: Day: Correct Recall: "Sock": Yes, no cue required Recall: "Blue": Yes, no cue required Recall: "Bed": Yes, no cue required BIMS Summary Score: 15 Sensation Sensation Light Touch: Appears Intact Hot/Cold: Appears  Intact Proprioception: Appears Intact Stereognosis: Appears Intact Coordination Gross Motor Movements are Fluid and Coordinated: Yes Fine Motor Movements are Fluid and Coordinated: Yes Motor  Motor Motor: Within Functional Limits Motor -  Discharge Observations: Generalized weakness but greatly improved since eval Mobility  Bed Mobility Bed Mobility: Rolling Right;Rolling Left;Supine to Sit;Sit to Supine Rolling Right: Independent Rolling Left: Independent Supine to Sit: Independent Sit to Supine: Independent Transfers Sit to Stand: Independent with assistive device;Independent Stand to Sit: Independent with assistive device  Balance Balance Balance Assessed: Yes Static Sitting Balance Static Sitting - Balance Support: Feet supported Static Sitting - Level of Assistance: 7: Independent Dynamic Sitting Balance Dynamic Sitting - Balance Support: During functional activity Dynamic Sitting - Level of Assistance: 7: Independent Static Standing Balance Static Standing - Balance Support: During functional activity Static Standing - Level of Assistance: 6: Modified independent (Device/Increase time) Dynamic Standing Balance Dynamic Standing - Balance Support: During functional activity Dynamic Standing - Level of Assistance: 6: Modified independent (Device/Increase time) Extremity/Trunk Assessment RUE Assessment RUE Assessment: Within Functional Limits LUE Assessment LUE Assessment: Within Functional Limits General Strength Comments: some limited shoulder FF at baseline, but able to use functionally   REEGHAN CHARTER 11/24/2022, 12:24 PM

## 2022-11-24 NOTE — Progress Notes (Signed)
Physical Therapy Discharge Summary  Patient Details  Name: Kaitlyn Bray MRN: [id number] Date of Birth: 1965/09/25  Date of Discharge from PT service:November 24, 2022  Today's Date: 11/24/2022 PT Individual Time: 1100-1200 and 845-915 PT Individual Time Calculation (min): 60 min and 30 min   Patient has met 5 of 5 long term goals due to improved activity tolerance and increased strength.  Patient to discharge at an ambulatory level Modified Independent.   Patient's care partner is independent to provide the necessary physical assistance at discharge (rollator assist for curbs.)  Reasons goals not met: All goals met.  Recommendation:  Patient will benefit from ongoing skilled PT services in home health setting to continue to advance safe functional mobility, address ongoing impairments in endurance and strength., and minimize fall risk.  Equipment: Rollator, wound vac  Reasons for discharge: treatment goals met and discharge from hospital  Patient/family agrees with progress made and goals achieved: Yes  Treatment plan:    First session:  Pt presents sitting in w/c and agreeable to therapy.  Pt transfers sit to stand w/ mod I and pt amb w/ rollator multiple trials w/ mod I and wound vac in bag on seat > 200'.  Pt amb into BR and performed all clothing management and pericare w/ mod I.  Pt returned to room and remained sitting in w/c w/ all needs in reach.  Second session:  Pt presents sitting in w/c w/ spouse present for family ed and agreeable to therapy.  Pt transfer sit to stand w/ mod I and amb w/ rollator > 300'.  Pt negotiates 12 steps w/ B rails and supervision, step-to gait pattern , ed on maintaining BOS. PT discussed progression to reciprocal gait pattern on stairs and pt states also has been educated on using 1 rail and B hands.  Pt performed curb height step w/ rollator and spouse assisting w/ bringing onto step and CGA.  Pt performed simulated sedan height car transfer using  mat table w/ sup/mod I.  Pt returned to room and performed sit to supine and sup to sit w/ mod I on flat bed w/o siderails.  Pt performed step pivot bed > w/c w/o AD and Independent.  Pt remained sitting in w/c w/ all needs in reach, spouse present.  Pt will be D/Ced after lunch.  All questions answered.   PT Discharge Precautions/Restrictions Precautions Precautions: Fall Precaution Comments: Abdominal wound vac, abdominal precautions Restrictions Weight Bearing Restrictions: No Vital Signs   Pain Pain Assessment Pain Scale: 0-10 Pain Score: 0-No pain Pain Interference Pain Interference Pain Effect on Sleep: 1. Rarely or not at all Pain Interference with Therapy Activities: 1. Rarely or not at all Pain Interference with Day-to-Day Activities: 1. Rarely or not at all Vision/Perception  Vision - History Baseline Vision: Wears glasses Ability to See in Adequate Light: 0 Adequate Perception Perception: Within Functional Limits Praxis Praxis: Intact  Cognition Overall Cognitive Status: Within Functional Limits for tasks assessed Arousal/Alertness: Awake/alert Orientation Level: Oriented X4 Memory: Appears intact Awareness: Appears intact Problem Solving: Appears intact Safety/Judgment: Appears intact Sensation Sensation Light Touch: Appears Intact Hot/Cold: Appears Intact Proprioception: Appears Intact Stereognosis: Appears Intact Coordination Gross Motor Movements are Fluid and Coordinated: Yes Fine Motor Movements are Fluid and Coordinated: Yes Motor  Motor Motor: Within Functional Limits Motor - Discharge Observations: Generalized weakness but greatly improved since eval  Mobility Bed Mobility Bed Mobility: Rolling Right;Rolling Left;Supine to Sit;Sit to Supine Rolling Right: Independent Rolling Left: Independent Supine to Sit:  Independent Sit to Supine: Independent Transfers Transfers: Sit to Stand;Stand Pivot Transfers;Stand to Sit Sit to Stand: Independent  with assistive device;Independent Stand to Sit: Independent with assistive device Stand Pivot Transfers: Independent;Independent with assistive device Transfer (Assistive device): Rollator Locomotion  Gait Ambulation: Yes Gait Assistance: Independent with assistive device Gait Distance (Feet): 300 Feet Assistive device: Rollator Gait Gait: Yes Gait Pattern: Within Functional Limits Gait velocity: improved to MNL Stairs / Additional Locomotion Stairs: Yes Stairs Assistance: Supervision/Verbal cueing Stair Management Technique: Two rails Number of Stairs: 12 Height of Stairs: 6 Ramp: Independent with assistive device Curb: Contact Guard/Touching assist (assist fromspouse for rollator to 5" step.) Pick up small object from the floor assist level: Independent with assistive device (reacher) Pick up small object from the floor assistive device: rollator and Chief Operating Officer Mobility: No  Trunk/Postural Assessment  Cervical Assessment Cervical Assessment: Within Functional Limits Thoracic Assessment Thoracic Assessment: Exceptions to [facility] (rounded shoulders) Lumbar Assessment Lumbar Assessment: Exceptions to [facility] Postural Control Postural Control: Within Functional Limits  Balance Balance Balance Assessed: Yes Static Sitting Balance Static Sitting - Balance Support: Feet supported Static Sitting - Level of Assistance: 7: Independent Dynamic Sitting Balance Dynamic Sitting - Balance Support: During functional activity Dynamic Sitting - Level of Assistance: 7: Independent Static Standing Balance Static Standing - Balance Support: During functional activity Static Standing - Level of Assistance: 6: Modified independent (Device/Increase time) Dynamic Standing Balance Dynamic Standing - Balance Support: During functional activity Dynamic Standing - Level of Assistance: 6: Modified independent (Device/Increase time) Extremity Assessment  RUE Assessment RUE  Assessment: Within Functional Limits LUE Assessment LUE Assessment: Within Functional Limits General Strength Comments: some limited shoulder FF at baseline, but able to use functionally RLE Assessment RLE Assessment: Within Functional Limits General Strength Comments: 5/5 except hip flexion strength NT 2/2 wound and vac but at least 3+/5. LLE Assessment LLE Assessment: Within Functional Limits General Strength Comments: 5/5 except hip flexion strength NT 2/2 wound and vac but at least 3+/5.   Lucio Edward 11/24/2022, 12:04 PM

## 2022-11-25 ENCOUNTER — Other Ambulatory Visit: Payer: Self-pay

## 2022-11-25 ENCOUNTER — Emergency Department (HOSPITAL_COMMUNITY)
Admission: EM | Admit: 2022-11-25 | Discharge: 2022-11-25 | Discharge status: Against Medical Advice | Payer: 59 | Attending: Emergency Medicine | Admitting: Emergency Medicine

## 2022-11-25 ENCOUNTER — Encounter (HOSPITAL_COMMUNITY): Payer: Self-pay | Admitting: *Deleted

## 2022-11-25 ENCOUNTER — Encounter (HOSPITAL_COMMUNITY): Payer: Self-pay

## 2022-11-25 ENCOUNTER — Emergency Department (HOSPITAL_COMMUNITY)
Admission: EM | Admit: 2022-11-25 | Discharge: 2022-11-25 | Disposition: A | Discharge status: Home / Self Care | Payer: 59 | Source: Home / Self Care | Attending: Emergency Medicine | Admitting: Emergency Medicine

## 2022-11-25 DIAGNOSIS — Z4801 Encounter for change or removal of surgical wound dressing: Secondary | ICD-10-CM | POA: Insufficient documentation

## 2022-11-25 DIAGNOSIS — Z5321 Procedure and treatment not carried out due to patient leaving prior to being seen by health care provider: Secondary | ICD-10-CM | POA: Insufficient documentation

## 2022-11-25 DIAGNOSIS — Z91040 Latex allergy status: Secondary | ICD-10-CM | POA: Insufficient documentation

## 2022-11-25 DIAGNOSIS — Z4689 Encounter for fitting and adjustment of other specified devices: Secondary | ICD-10-CM

## 2022-11-25 NOTE — ED Notes (Signed)
Pt is a&ox4, pwd. Pt complains of a wound vac not working. She complains of very slight pressure at site but nothing too bothersome. Pt is resting in the bed, SO at the bedside.

## 2022-11-25 NOTE — Discharge Instructions (Addendum)
Your wound VAC has been unclogged and is now working properly.  Please attend your postop appointment on 7/30.  Please return to the ER should you develop any fever, chills, concerning symptoms.

## 2022-11-25 NOTE — ED Triage Notes (Signed)
The pt was dischaged from [facility] yesterday  she had a hernia surgery  and she has a wound vac that is not working and she has not been able to fix it

## 2022-11-25 NOTE — ED Triage Notes (Signed)
Pt reports  Wound vac problem Thinks its clogged Currently beeping in triage Placed yesterday

## 2022-11-25 NOTE — ED Notes (Signed)
Rn from 2 MW was able to trouble shoot vac and get it working again. RN did some education and trouble shooting with the patient for a better understanding.

## 2022-11-25 NOTE — ED Provider Notes (Signed)
[place] EMERGENCY DEPARTMENT AT [facility] Provider Note   CSN: [id number] Arrival date & time: 11/25/22  1457     History  Chief Complaint  Patient presents with   wound vac not working    Kaitlyn Bray is a 57 y.o. female who was recently discharged after laparotomy on 6/20, now with a wound VAC she is concerned is not working properly.  She reports it seems like it has been clogged and not suctioning since this morning.  Denies any other concerns.  HPI     Home Medications Prior to Admission medications   Medication Sig Start Date End Date Taking? Authorizing Provider  acetaminophen (TYLENOL) 325 MG tablet Take 1-2 tablets (325-650 mg total) by mouth every 4 (four) hours as needed for mild pain. 11/22/22   Angiulli, Mcarthur Rossetti, PA-C  amoxicillin-clavulanate (AUGMENTIN) 875-125 MG tablet Take 1 tablet by mouth every 12 (twelve) hours. 11/23/22 12/23/22  Angiulli, Mcarthur Rossetti, PA-C  Ascorbic Acid (VITAMIN C PO) Take 1 capsule by mouth daily.    [provider]  Cholecalciferol (VITAMIN D-3 PO) Take 1 capsule by mouth daily.    [provider]  Cyanocobalamin (B-12 PO) Take 1 tablet by mouth daily.    [provider]  famotidine (PEPCID) 20 MG tablet Take 1 tablet (20 mg total) by mouth 2 (two) times daily. 11/23/22   Angiulli, Mcarthur Rossetti, PA-C  fluconazole (DIFLUCAN) 200 MG tablet Take 2 tablets (400 mg total) by mouth daily. 11/23/22 12/23/22  Angiulli, Mcarthur Rossetti, PA-C  gabapentin (NEURONTIN) 400 MG capsule Take 1 capsule (400 mg total) by mouth 3 (three) times daily. 11/23/22   Angiulli, Mcarthur Rossetti, PA-C  loratadine (CLARITIN) 10 MG tablet Take 1 tablet (10 mg total) by mouth daily. 11/23/22   Angiulli, Mcarthur Rossetti, PA-C  losartan-hydrochlorothiazide (HYZAAR) 50-12.5 MG tablet TAKE 1 TABLET BY MOUTH EVERY DAY 09/20/22   Mort Sawyers, FNP  meclizine (ANTIVERT) 25 MG tablet Take 1 tablet (25 mg total) by mouth 3 (three) times daily as needed for dizziness.  11/23/22   Angiulli, Mcarthur Rossetti, PA-C  metFORMIN (GLUCOPHAGE-XR) 500 MG 24 hr tablet Take 1 tablet (500 mg total) by mouth 2 (two) times daily. 11/23/22   Angiulli, Mcarthur Rossetti, PA-C  Multiple Vitamins-Minerals (ZINC PO) Take 1 tablet by mouth daily.    [provider]  oxyCODONE (OXY IR/ROXICODONE) 5 MG immediate release tablet Take 1 tablet (5 mg total) by mouth every 4 (four) hours as needed for moderate pain. 11/23/22   Angiulli, Mcarthur Rossetti, PA-C  polycarbophil (FIBERCON) 625 MG tablet Take 1 tablet (625 mg total) by mouth 2 (two) times daily. 11/23/22   Angiulli, Mcarthur Rossetti, PA-C  polyethylene glycol (MIRALAX / GLYCOLAX) 17 g packet Take 17 g by mouth daily. 11/24/22   Angiulli, Mcarthur Rossetti, PA-C      Allergies    Latex, Codeine, and Sulfa drugs cross reactors    Review of Systems   Review of Systems  Constitutional:  Negative for chills and fever.    Physical Exam Updated Vital Signs BP 135/88 (BP Location: Right Arm)   Pulse 95   Temp 98.2 F (36.8 C)   Resp 20   Ht 5' (1.524 m)   Wt 99.8 kg   SpO2 98%   BMI 42.97 kg/m  Physical Exam Vitals and nursing note reviewed.  Constitutional:      Appearance: Normal appearance.  HENT:     Head: Atraumatic.  Pulmonary:  Effort: Pulmonary effort is normal.  Abdominal:     Comments: Wound VAC placed on lower abdominal wall. No edema, edema around her abdominal wound. No purulent drainage in the wound VAC  Neurological:     General: No focal deficit present.     Mental Status: She is alert.  Psychiatric:        Mood and Affect: Mood normal.        Behavior: Behavior normal.     ED Results / Procedures / Treatments   Labs (all labs ordered are listed, but only abnormal results are displayed) Labs Reviewed - No data to display  EKG None  Radiology No results found.  Procedures Procedures    Medications Ordered in ED Medications - No data to display  ED Course/ Medical Decision Making/ A&P                              Medical Decision Making  57 y.o. female with pertinent past medical history of laparotomy on 6/20 with placement of a wound VAC presents to the ED for concern of malfunctioning wound VAC  Differential diagnosis includes but is not limited to malfunctioning wound vac  ED Course:  Was able to get into contact with nursing and they came to unclog the wound VAC.  Wound VAC is now functioning appropriately.  Patient discharged in good condition.  She understands she has a postop follow-up on 7/30 to attend   Impression: Clogged wound VAC  Disposition:  The patient was discharged home with instructions to follow-up with her surgery team for postop appointment on 7/30.  Return precautions given.  Lab Tests: Not indicated  Imaging Studies ordered: Not indicated   Cardiac Monitoring: / EKG: Not indicated   Consultations Obtained: Not indicated  Co morbidities that complicate the patient evaluation  Recent laparotomy   Social Determinants of Health:  unknown              Final Clinical Impression(s) / ED Diagnoses Final diagnoses:  Encounter for management of wound VAC    Rx / DC Orders ED Discharge Orders     None         Arabella Merles, PA-C 11/25/22 1717    Terrilee Files, MD 11/26/22 [phone]

## 2022-11-25 NOTE — ED Notes (Signed)
RN from [facility] to see pt and assess wound vac.

## 2022-11-27 DIAGNOSIS — T8144XA Sepsis following a procedure, initial encounter: Secondary | ICD-10-CM | POA: Diagnosis not present

## 2022-11-27 DIAGNOSIS — Z9181 History of falling: Secondary | ICD-10-CM | POA: Diagnosis not present

## 2022-11-27 DIAGNOSIS — K659 Peritonitis, unspecified: Secondary | ICD-10-CM | POA: Diagnosis not present

## 2022-11-27 DIAGNOSIS — Z7984 Long term (current) use of oral hypoglycemic drugs: Secondary | ICD-10-CM | POA: Diagnosis not present

## 2022-11-27 DIAGNOSIS — J45909 Unspecified asthma, uncomplicated: Secondary | ICD-10-CM | POA: Diagnosis not present

## 2022-11-27 DIAGNOSIS — A419 Sepsis, unspecified organism: Secondary | ICD-10-CM | POA: Diagnosis not present

## 2022-11-27 DIAGNOSIS — Z90710 Acquired absence of both cervix and uterus: Secondary | ICD-10-CM | POA: Diagnosis not present

## 2022-11-27 DIAGNOSIS — E119 Type 2 diabetes mellitus without complications: Secondary | ICD-10-CM | POA: Diagnosis not present

## 2022-11-27 DIAGNOSIS — D62 Acute posthemorrhagic anemia: Secondary | ICD-10-CM | POA: Diagnosis not present

## 2022-11-27 DIAGNOSIS — E669 Obesity, unspecified: Secondary | ICD-10-CM | POA: Diagnosis not present

## 2022-11-27 DIAGNOSIS — K219 Gastro-esophageal reflux disease without esophagitis: Secondary | ICD-10-CM | POA: Diagnosis not present

## 2022-11-27 DIAGNOSIS — Z6836 Body mass index (BMI) 36.0-36.9, adult: Secondary | ICD-10-CM | POA: Diagnosis not present

## 2022-11-27 DIAGNOSIS — K631 Perforation of intestine (nontraumatic): Secondary | ICD-10-CM | POA: Diagnosis not present

## 2022-11-27 DIAGNOSIS — Z792 Long term (current) use of antibiotics: Secondary | ICD-10-CM | POA: Diagnosis not present

## 2022-11-27 DIAGNOSIS — K9189 Other postprocedural complications and disorders of digestive system: Secondary | ICD-10-CM | POA: Diagnosis not present

## 2022-11-27 DIAGNOSIS — I10 Essential (primary) hypertension: Secondary | ICD-10-CM | POA: Diagnosis not present

## 2022-11-29 DIAGNOSIS — D62 Acute posthemorrhagic anemia: Secondary | ICD-10-CM | POA: Diagnosis not present

## 2022-11-29 DIAGNOSIS — Z9181 History of falling: Secondary | ICD-10-CM | POA: Diagnosis not present

## 2022-11-29 DIAGNOSIS — K9189 Other postprocedural complications and disorders of digestive system: Secondary | ICD-10-CM | POA: Diagnosis not present

## 2022-11-29 DIAGNOSIS — A419 Sepsis, unspecified organism: Secondary | ICD-10-CM | POA: Diagnosis not present

## 2022-11-29 DIAGNOSIS — E119 Type 2 diabetes mellitus without complications: Secondary | ICD-10-CM | POA: Diagnosis not present

## 2022-11-29 DIAGNOSIS — Z6836 Body mass index (BMI) 36.0-36.9, adult: Secondary | ICD-10-CM | POA: Diagnosis not present

## 2022-11-29 DIAGNOSIS — K631 Perforation of intestine (nontraumatic): Secondary | ICD-10-CM | POA: Diagnosis not present

## 2022-11-29 DIAGNOSIS — Z90710 Acquired absence of both cervix and uterus: Secondary | ICD-10-CM | POA: Diagnosis not present

## 2022-11-29 DIAGNOSIS — K219 Gastro-esophageal reflux disease without esophagitis: Secondary | ICD-10-CM | POA: Diagnosis not present

## 2022-11-29 DIAGNOSIS — Z792 Long term (current) use of antibiotics: Secondary | ICD-10-CM | POA: Diagnosis not present

## 2022-11-29 DIAGNOSIS — K659 Peritonitis, unspecified: Secondary | ICD-10-CM | POA: Diagnosis not present

## 2022-11-29 DIAGNOSIS — Z7984 Long term (current) use of oral hypoglycemic drugs: Secondary | ICD-10-CM | POA: Diagnosis not present

## 2022-11-29 DIAGNOSIS — E669 Obesity, unspecified: Secondary | ICD-10-CM | POA: Diagnosis not present

## 2022-11-29 DIAGNOSIS — J45909 Unspecified asthma, uncomplicated: Secondary | ICD-10-CM | POA: Diagnosis not present

## 2022-11-29 DIAGNOSIS — T8144XA Sepsis following a procedure, initial encounter: Secondary | ICD-10-CM | POA: Diagnosis not present

## 2022-11-29 DIAGNOSIS — I10 Essential (primary) hypertension: Secondary | ICD-10-CM | POA: Diagnosis not present

## 2022-11-30 ENCOUNTER — Encounter (HOSPITAL_COMMUNITY): Payer: Self-pay

## 2022-11-30 ENCOUNTER — Ambulatory Visit: Payer: 59

## 2022-11-30 ENCOUNTER — Emergency Department (HOSPITAL_COMMUNITY)
Admission: EM | Admit: 2022-11-30 | Discharge: 2022-11-30 | Disposition: A | Discharge status: Home / Self Care | Payer: 59 | Attending: Emergency Medicine | Admitting: Emergency Medicine

## 2022-11-30 ENCOUNTER — Other Ambulatory Visit: Payer: Self-pay

## 2022-11-30 DIAGNOSIS — I10 Essential (primary) hypertension: Secondary | ICD-10-CM | POA: Diagnosis not present

## 2022-11-30 DIAGNOSIS — Z7984 Long term (current) use of oral hypoglycemic drugs: Secondary | ICD-10-CM | POA: Insufficient documentation

## 2022-11-30 DIAGNOSIS — Z91040 Latex allergy status: Secondary | ICD-10-CM | POA: Insufficient documentation

## 2022-11-30 DIAGNOSIS — E119 Type 2 diabetes mellitus without complications: Secondary | ICD-10-CM | POA: Insufficient documentation

## 2022-11-30 DIAGNOSIS — Z79899 Other long term (current) drug therapy: Secondary | ICD-10-CM | POA: Insufficient documentation

## 2022-11-30 DIAGNOSIS — Z4801 Encounter for change or removal of surgical wound dressing: Secondary | ICD-10-CM | POA: Insufficient documentation

## 2022-11-30 DIAGNOSIS — J45909 Unspecified asthma, uncomplicated: Secondary | ICD-10-CM | POA: Insufficient documentation

## 2022-11-30 DIAGNOSIS — Z5189 Encounter for other specified aftercare: Secondary | ICD-10-CM

## 2022-11-30 DIAGNOSIS — T8189XA Other complications of procedures, not elsewhere classified, initial encounter: Secondary | ICD-10-CM | POA: Diagnosis not present

## 2022-11-30 NOTE — ED Notes (Signed)
Wound Care NP at bedside.  Will place saline dressing over wound site.

## 2022-11-30 NOTE — ED Triage Notes (Signed)
Says she had major abdominal surgery in June 2024 at [facility].   Stayed in ICU for septic shock and has had a wound vac for last 3 weeks.   Canister is full and filter is clogged. Home health nurse encouraged coming to ED for replacement part and wound care evaluation.   Also says perimeter around the wound is very hard, red and painful.

## 2022-11-30 NOTE — ED Provider Notes (Signed)
[place] EMERGENCY DEPARTMENT AT [facility] Provider Note   CSN: [id number] Arrival date & time: 11/30/22  3664     History  Chief Complaint  Patient presents with   Post-op Problem    Kaitlyn Bray is a 57 y.o. female.  The history is provided by the patient and medical records. No language interpreter was used.     57 year old female with history of obesity, hypertension, diabetes, asthma, previously had a ventral hernia repair on 6/10 with subsequent complication including sepsis from bowel perforation requiring exploratory laparotomy on 6/16 followed by wound VAC presenting to the ED today with concerns of clogged wound VAC canister.  Patient reports yesterday afternoon she had her wound VAC filter replaced by the wound care nurse.  A few hours later she started to notice pain and discomfort around her wound site.  This morning she also noticed that the filter is not performing adequate suctioning due to clogging the filter thus prompting this ER visit.  She endorsed soreness and tightness around her wound site that is new.  She also endorsed some subjective fever of 100.3 two days ago which improved with Tylenol.  She does not endorse any nausea vomiting diarrhea no bodyaches.  Home Medications Prior to Admission medications   Medication Sig Start Date End Date Taking? Authorizing Provider  acetaminophen (TYLENOL) 325 MG tablet Take 1-2 tablets (325-650 mg total) by mouth every 4 (four) hours as needed for mild pain. 11/22/22   Angiulli, Mcarthur Rossetti, PA-C  amoxicillin-clavulanate (AUGMENTIN) 875-125 MG tablet Take 1 tablet by mouth every 12 (twelve) hours. 11/23/22 12/23/22  Angiulli, Mcarthur Rossetti, PA-C  Ascorbic Acid (VITAMIN C PO) Take 1 capsule by mouth daily.    [provider]  Cholecalciferol (VITAMIN D-3 PO) Take 1 capsule by mouth daily.    [provider]  Cyanocobalamin (B-12 PO) Take 1 tablet by mouth daily.    [provider]  famotidine  (PEPCID) 20 MG tablet Take 1 tablet (20 mg total) by mouth 2 (two) times daily. 11/23/22   Angiulli, Mcarthur Rossetti, PA-C  fluconazole (DIFLUCAN) 200 MG tablet Take 2 tablets (400 mg total) by mouth daily. 11/23/22 12/23/22  Angiulli, Mcarthur Rossetti, PA-C  gabapentin (NEURONTIN) 400 MG capsule Take 1 capsule (400 mg total) by mouth 3 (three) times daily. 11/23/22   Angiulli, Mcarthur Rossetti, PA-C  loratadine (CLARITIN) 10 MG tablet Take 1 tablet (10 mg total) by mouth daily. 11/23/22   Angiulli, Mcarthur Rossetti, PA-C  losartan-hydrochlorothiazide (HYZAAR) 50-12.5 MG tablet TAKE 1 TABLET BY MOUTH EVERY DAY 09/20/22   Mort Sawyers, FNP  meclizine (ANTIVERT) 25 MG tablet Take 1 tablet (25 mg total) by mouth 3 (three) times daily as needed for dizziness. 11/23/22   Angiulli, Mcarthur Rossetti, PA-C  metFORMIN (GLUCOPHAGE-XR) 500 MG 24 hr tablet Take 1 tablet (500 mg total) by mouth 2 (two) times daily. 11/23/22   Angiulli, Mcarthur Rossetti, PA-C  Multiple Vitamins-Minerals (ZINC PO) Take 1 tablet by mouth daily.    [provider]  oxyCODONE (OXY IR/ROXICODONE) 5 MG immediate release tablet Take 1 tablet (5 mg total) by mouth every 4 (four) hours as needed for moderate pain. 11/23/22   Angiulli, Mcarthur Rossetti, PA-C  polycarbophil (FIBERCON) 625 MG tablet Take 1 tablet (625 mg total) by mouth 2 (two) times daily. 11/23/22   Angiulli, Mcarthur Rossetti, PA-C  polyethylene glycol (MIRALAX / GLYCOLAX) 17 g packet Take 17 g by mouth daily. 11/24/22   Angiulli, Mcarthur Rossetti, PA-C  Allergies    Latex, Codeine, and Sulfa drugs cross reactors    Review of Systems   Review of Systems  All other systems reviewed and are negative.   Physical Exam Updated Vital Signs BP 135/76   Pulse 91   Temp 98.2 F (36.8 C) (Oral)   Resp 16   Ht 5' (1.524 m)   Wt 99.8 kg   SpO2 98%   BMI 42.97 kg/m  Physical Exam Vitals and nursing note reviewed.  Constitutional:      General: She is not in acute distress.    Appearance: She is well-developed.  HENT:     Head:  Atraumatic.  Eyes:     Conjunctiva/sclera: Conjunctivae normal.  Cardiovascular:     Rate and Rhythm: Normal rate and regular rhythm.     Heart sounds: Murmur heard.  Pulmonary:     Effort: Pulmonary effort is normal.  Abdominal:     Tenderness: There is abdominal tenderness.     Comments: Patient has a large mid abdominal surgical wound with wound VAC in place.  No significant erythema or edema or warmth noted to the perimeter of this wound however it is tender to palpation.  Bowel sounds present.  The canister filter is clogged.  Musculoskeletal:     Cervical back: Neck supple.  Skin:    Findings: No rash.  Neurological:     Mental Status: She is alert and oriented to person, place, and time.  Psychiatric:        Mood and Affect: Mood normal.     ED Results / Procedures / Treatments   Labs (all labs ordered are listed, but only abnormal results are displayed) Labs Reviewed - No data to display  EKG None  Radiology No results found.  Procedures Procedures    Medications Ordered in ED Medications - No data to display  ED Course/ Medical Decision Making/ A&P                             Medical Decision Making  BP 135/76   Pulse 91   Temp 98.2 F (36.8 C) (Oral)   Resp 16   Ht 5' (1.524 m)   Wt 99.8 kg   SpO2 98%   BMI 42.97 kg/m   61:42 AM  57 year old female with history of obesity, hypertension, diabetes, asthma, previously had a ventral hernia repair on 6/10 with subsequent complication including sepsis from bowel perforation requiring exploratory laparotomy on 6/16 followed by wound VAC presenting to the ED today with concerns of clogged wound VAC canister.  Patient reports yesterday afternoon she had her wound VAC filter replaced by the wound care nurse.  A few hours later she started to notice pain and discomfort around her wound site.  This morning she also noticed that the filter is not performing adequate suctioning due to clogging the filter thus  prompting this ER visit.  She endorsed soreness and tightness around her wound site that is new.  She also endorsed some subjective fever of 100.3 two days ago which improved with Tylenol.  She does not endorse any nausea vomiting diarrhea no bodyaches.  On exam, patient laying bed appears to be in no acute discomfort.  She has a large surgical wound to her mid abdomen with a wound VAC in place.  It does not appears to be any obvious cellulitic skin changes to the site but it is tender to palpation.  The canister  from the wound VAC is indicating that the machine has a clogged filter.  Plan to reach out to wound care nurse to address this wound and get the dressing change.  Wound care specialist did evaluate patient.  Unfortunately specialist does not have supply for the specific device (Medela).  The wound was managed with wet-to-dry dressing and patient was provided with supplies for several dressing changes.  Home health is made aware and since patient gave the wrong address for supplies they will reroute try to the correct address.  Patient voiced improvement of symptoms after dressing change and felt comfortable going home.  She will receive equipment in a timely manner.        Final Clinical Impression(s) / ED Diagnoses Final diagnoses:  Visit for wound check    Rx / DC Orders ED Discharge Orders     None         Fayrene Helper, PA-C 11/30/22 1041    Cathren Laine, MD 11/30/22 1340

## 2022-11-30 NOTE — Consult Note (Signed)
WOC Nurse Consult Note: Reason for Consult:MIdline surgical wound with exposed mesh.  Has been at home with Medela negative pressure.  Canister was alarming blockage and patient has no canisters at home.  HH is unable to help her.  Patient did not return to her home address, but to the home of a friend and her supplies were routed to the wrong address.  She has had those supplies returned and new supplies mailed and they have not arrived.  I inform her that we do not have Medela negative pressure supplies in house as this is not formulary.  She states her [facility] nurse "may have a canister tomorrow".  I inform her that we will apply NS moist dressings and she will change this twice daily and PRN soilage until her MEdela supplies arrive at her current place of residence.  Her spouse is present and I inform him that I will teach him this dressing change and he will need to complete twice daily.  He inquires about HH coming to do all of the dressing changes and we discuss skilled wound care and that [facility] would teach someone in the home these dressing changes if NPWT was not in place.  SInce there are no supplies available for NPWT, this is our only option.  They agree.  I perform dressing change and provide supplies for the next several dressing changes.   Wound type: midline surgical Pressure Injury POA: NA Measurement: 15 cm x 11 cm x 2.4 cm with undermining at distal end, ends 2.2 cm  Wound bed: pale pink nongranulating, mesh in place.   Drainage (amount, consistency, odor)  Heavy serous effluent pooling in distal end.  No odor.  Explain to patient and spouse that dressing change frequency must be sufficient to keep wound moist but not overly wet.  VErbalize understanding to watch for strikethrough and soilage.  Periwound:intact  I protect wound edges with skin prep in case skin is exposed to heavy effluent.  [facility] nurse reported that surrounding skin was hard and red.  That is not appreciated at this assessment.   Photo in chart.  Dressing procedure/placement/frequency: Remove NPWT dressing.  Cleanse wound gently with NS and pat gently dry. Apply skin prep to wound edges to protect.  GEntly fill wound bed with kerlix (rolled gauze) (Used 1/2 roll today) taking care to gently fill in undermining.  Cover with dry gauze and ABD pad. Secure with tape. Change twice daily and PRN soilage.  Spouse observes this and takes possession of wound care supplies, placed in belongings bag.  Removed old canister and tubing and discarded. NPWT unit left with patient in the bed and they are encouraged to keep unit charged in anticipation of reconnection.  She states they have errands to run today when they leave the ED.  She is staying with a friend who also has wound care experience and will assist with dressing changes.  She calls this friend while I am in the room and they agree to assist.  Patient calls her [facility] nurse to explain that she needs supplies and then [facility]  can reconnect to device.  There is no answer.   No further needs at this time.   Will not follow at this time.  Please re-consult if needed.  Mike Gip MSN, RN, FNP-BC CWON Wound, Ostomy, Continence Nurse Outpatient [facility] [phone] Pager [phone]

## 2022-11-30 NOTE — Discharge Instructions (Signed)
You should receive appropriate equipment for your wound care shipped to your residence.  Please use as appropriate.  Follow-up with your doctor for further care.

## 2022-12-01 DIAGNOSIS — T8144XA Sepsis following a procedure, initial encounter: Secondary | ICD-10-CM | POA: Diagnosis not present

## 2022-12-01 DIAGNOSIS — K9189 Other postprocedural complications and disorders of digestive system: Secondary | ICD-10-CM | POA: Diagnosis not present

## 2022-12-01 DIAGNOSIS — K219 Gastro-esophageal reflux disease without esophagitis: Secondary | ICD-10-CM | POA: Diagnosis not present

## 2022-12-01 DIAGNOSIS — Z792 Long term (current) use of antibiotics: Secondary | ICD-10-CM | POA: Diagnosis not present

## 2022-12-01 DIAGNOSIS — K631 Perforation of intestine (nontraumatic): Secondary | ICD-10-CM | POA: Diagnosis not present

## 2022-12-01 DIAGNOSIS — K659 Peritonitis, unspecified: Secondary | ICD-10-CM | POA: Diagnosis not present

## 2022-12-01 DIAGNOSIS — D62 Acute posthemorrhagic anemia: Secondary | ICD-10-CM | POA: Diagnosis not present

## 2022-12-01 DIAGNOSIS — Z9181 History of falling: Secondary | ICD-10-CM | POA: Diagnosis not present

## 2022-12-01 DIAGNOSIS — Z6836 Body mass index (BMI) 36.0-36.9, adult: Secondary | ICD-10-CM | POA: Diagnosis not present

## 2022-12-01 DIAGNOSIS — I10 Essential (primary) hypertension: Secondary | ICD-10-CM | POA: Diagnosis not present

## 2022-12-01 DIAGNOSIS — E119 Type 2 diabetes mellitus without complications: Secondary | ICD-10-CM | POA: Diagnosis not present

## 2022-12-01 DIAGNOSIS — Z7984 Long term (current) use of oral hypoglycemic drugs: Secondary | ICD-10-CM | POA: Diagnosis not present

## 2022-12-01 DIAGNOSIS — J45909 Unspecified asthma, uncomplicated: Secondary | ICD-10-CM | POA: Diagnosis not present

## 2022-12-01 DIAGNOSIS — Z90710 Acquired absence of both cervix and uterus: Secondary | ICD-10-CM | POA: Diagnosis not present

## 2022-12-01 DIAGNOSIS — A419 Sepsis, unspecified organism: Secondary | ICD-10-CM | POA: Diagnosis not present

## 2022-12-01 DIAGNOSIS — E669 Obesity, unspecified: Secondary | ICD-10-CM | POA: Diagnosis not present

## 2022-12-02 DIAGNOSIS — K219 Gastro-esophageal reflux disease without esophagitis: Secondary | ICD-10-CM | POA: Diagnosis not present

## 2022-12-02 DIAGNOSIS — T8144XA Sepsis following a procedure, initial encounter: Secondary | ICD-10-CM | POA: Diagnosis not present

## 2022-12-02 DIAGNOSIS — E119 Type 2 diabetes mellitus without complications: Secondary | ICD-10-CM | POA: Diagnosis not present

## 2022-12-02 DIAGNOSIS — K9189 Other postprocedural complications and disorders of digestive system: Secondary | ICD-10-CM | POA: Diagnosis not present

## 2022-12-02 DIAGNOSIS — K631 Perforation of intestine (nontraumatic): Secondary | ICD-10-CM | POA: Diagnosis not present

## 2022-12-02 DIAGNOSIS — Z90710 Acquired absence of both cervix and uterus: Secondary | ICD-10-CM | POA: Diagnosis not present

## 2022-12-02 DIAGNOSIS — J45909 Unspecified asthma, uncomplicated: Secondary | ICD-10-CM | POA: Diagnosis not present

## 2022-12-02 DIAGNOSIS — D62 Acute posthemorrhagic anemia: Secondary | ICD-10-CM | POA: Diagnosis not present

## 2022-12-02 DIAGNOSIS — K659 Peritonitis, unspecified: Secondary | ICD-10-CM | POA: Diagnosis not present

## 2022-12-02 DIAGNOSIS — Z792 Long term (current) use of antibiotics: Secondary | ICD-10-CM | POA: Diagnosis not present

## 2022-12-02 DIAGNOSIS — E669 Obesity, unspecified: Secondary | ICD-10-CM | POA: Diagnosis not present

## 2022-12-02 DIAGNOSIS — Z9181 History of falling: Secondary | ICD-10-CM | POA: Diagnosis not present

## 2022-12-02 DIAGNOSIS — A419 Sepsis, unspecified organism: Secondary | ICD-10-CM | POA: Diagnosis not present

## 2022-12-02 DIAGNOSIS — Z7984 Long term (current) use of oral hypoglycemic drugs: Secondary | ICD-10-CM | POA: Diagnosis not present

## 2022-12-02 DIAGNOSIS — I10 Essential (primary) hypertension: Secondary | ICD-10-CM | POA: Diagnosis not present

## 2022-12-02 DIAGNOSIS — Z6836 Body mass index (BMI) 36.0-36.9, adult: Secondary | ICD-10-CM | POA: Diagnosis not present

## 2022-12-04 DIAGNOSIS — I10 Essential (primary) hypertension: Secondary | ICD-10-CM | POA: Diagnosis not present

## 2022-12-04 DIAGNOSIS — E669 Obesity, unspecified: Secondary | ICD-10-CM | POA: Diagnosis not present

## 2022-12-04 DIAGNOSIS — Z792 Long term (current) use of antibiotics: Secondary | ICD-10-CM | POA: Diagnosis not present

## 2022-12-04 DIAGNOSIS — K9189 Other postprocedural complications and disorders of digestive system: Secondary | ICD-10-CM | POA: Diagnosis not present

## 2022-12-04 DIAGNOSIS — Z7984 Long term (current) use of oral hypoglycemic drugs: Secondary | ICD-10-CM | POA: Diagnosis not present

## 2022-12-04 DIAGNOSIS — T8144XA Sepsis following a procedure, initial encounter: Secondary | ICD-10-CM | POA: Diagnosis not present

## 2022-12-04 DIAGNOSIS — K659 Peritonitis, unspecified: Secondary | ICD-10-CM | POA: Diagnosis not present

## 2022-12-04 DIAGNOSIS — K631 Perforation of intestine (nontraumatic): Secondary | ICD-10-CM | POA: Diagnosis not present

## 2022-12-04 DIAGNOSIS — A419 Sepsis, unspecified organism: Secondary | ICD-10-CM | POA: Diagnosis not present

## 2022-12-04 DIAGNOSIS — Z6836 Body mass index (BMI) 36.0-36.9, adult: Secondary | ICD-10-CM | POA: Diagnosis not present

## 2022-12-04 DIAGNOSIS — D62 Acute posthemorrhagic anemia: Secondary | ICD-10-CM | POA: Diagnosis not present

## 2022-12-04 DIAGNOSIS — K219 Gastro-esophageal reflux disease without esophagitis: Secondary | ICD-10-CM | POA: Diagnosis not present

## 2022-12-04 DIAGNOSIS — J45909 Unspecified asthma, uncomplicated: Secondary | ICD-10-CM | POA: Diagnosis not present

## 2022-12-04 DIAGNOSIS — Z90710 Acquired absence of both cervix and uterus: Secondary | ICD-10-CM | POA: Diagnosis not present

## 2022-12-04 DIAGNOSIS — E119 Type 2 diabetes mellitus without complications: Secondary | ICD-10-CM | POA: Diagnosis not present

## 2022-12-04 DIAGNOSIS — Z9181 History of falling: Secondary | ICD-10-CM | POA: Diagnosis not present

## 2022-12-05 DIAGNOSIS — T8131XD Disruption of external operation (surgical) wound, not elsewhere classified, subsequent encounter: Secondary | ICD-10-CM | POA: Diagnosis not present

## 2022-12-07 ENCOUNTER — Telehealth: Payer: Self-pay

## 2022-12-07 ENCOUNTER — Ambulatory Visit: Payer: 59

## 2022-12-07 NOTE — Telephone Encounter (Signed)
Received call from [place], [place] with [facility], notifying office that patient has declined OT services. Advised her our office is not managing her OT orders.   Sandie Ano, RN

## 2022-12-08 DIAGNOSIS — T8144XA Sepsis following a procedure, initial encounter: Secondary | ICD-10-CM | POA: Diagnosis not present

## 2022-12-08 DIAGNOSIS — Z9181 History of falling: Secondary | ICD-10-CM | POA: Diagnosis not present

## 2022-12-08 DIAGNOSIS — E119 Type 2 diabetes mellitus without complications: Secondary | ICD-10-CM | POA: Diagnosis not present

## 2022-12-08 DIAGNOSIS — K9189 Other postprocedural complications and disorders of digestive system: Secondary | ICD-10-CM | POA: Diagnosis not present

## 2022-12-08 DIAGNOSIS — E669 Obesity, unspecified: Secondary | ICD-10-CM | POA: Diagnosis not present

## 2022-12-08 DIAGNOSIS — K219 Gastro-esophageal reflux disease without esophagitis: Secondary | ICD-10-CM | POA: Diagnosis not present

## 2022-12-08 DIAGNOSIS — A419 Sepsis, unspecified organism: Secondary | ICD-10-CM | POA: Diagnosis not present

## 2022-12-08 DIAGNOSIS — K631 Perforation of intestine (nontraumatic): Secondary | ICD-10-CM | POA: Diagnosis not present

## 2022-12-08 DIAGNOSIS — J45909 Unspecified asthma, uncomplicated: Secondary | ICD-10-CM | POA: Diagnosis not present

## 2022-12-08 DIAGNOSIS — D62 Acute posthemorrhagic anemia: Secondary | ICD-10-CM | POA: Diagnosis not present

## 2022-12-08 DIAGNOSIS — Z90710 Acquired absence of both cervix and uterus: Secondary | ICD-10-CM | POA: Diagnosis not present

## 2022-12-08 DIAGNOSIS — Z7984 Long term (current) use of oral hypoglycemic drugs: Secondary | ICD-10-CM | POA: Diagnosis not present

## 2022-12-08 DIAGNOSIS — Z6836 Body mass index (BMI) 36.0-36.9, adult: Secondary | ICD-10-CM | POA: Diagnosis not present

## 2022-12-08 DIAGNOSIS — I10 Essential (primary) hypertension: Secondary | ICD-10-CM | POA: Diagnosis not present

## 2022-12-08 DIAGNOSIS — Z792 Long term (current) use of antibiotics: Secondary | ICD-10-CM | POA: Diagnosis not present

## 2022-12-08 DIAGNOSIS — K659 Peritonitis, unspecified: Secondary | ICD-10-CM | POA: Diagnosis not present

## 2022-12-11 DIAGNOSIS — Z6836 Body mass index (BMI) 36.0-36.9, adult: Secondary | ICD-10-CM | POA: Diagnosis not present

## 2022-12-11 DIAGNOSIS — Z792 Long term (current) use of antibiotics: Secondary | ICD-10-CM | POA: Diagnosis not present

## 2022-12-11 DIAGNOSIS — A419 Sepsis, unspecified organism: Secondary | ICD-10-CM | POA: Diagnosis not present

## 2022-12-11 DIAGNOSIS — T8144XA Sepsis following a procedure, initial encounter: Secondary | ICD-10-CM | POA: Diagnosis not present

## 2022-12-11 DIAGNOSIS — K9189 Other postprocedural complications and disorders of digestive system: Secondary | ICD-10-CM | POA: Diagnosis not present

## 2022-12-11 DIAGNOSIS — E119 Type 2 diabetes mellitus without complications: Secondary | ICD-10-CM | POA: Diagnosis not present

## 2022-12-11 DIAGNOSIS — I10 Essential (primary) hypertension: Secondary | ICD-10-CM | POA: Diagnosis not present

## 2022-12-11 DIAGNOSIS — K219 Gastro-esophageal reflux disease without esophagitis: Secondary | ICD-10-CM | POA: Diagnosis not present

## 2022-12-11 DIAGNOSIS — Z90710 Acquired absence of both cervix and uterus: Secondary | ICD-10-CM | POA: Diagnosis not present

## 2022-12-11 DIAGNOSIS — K631 Perforation of intestine (nontraumatic): Secondary | ICD-10-CM | POA: Diagnosis not present

## 2022-12-11 DIAGNOSIS — D62 Acute posthemorrhagic anemia: Secondary | ICD-10-CM | POA: Diagnosis not present

## 2022-12-11 DIAGNOSIS — Z7984 Long term (current) use of oral hypoglycemic drugs: Secondary | ICD-10-CM | POA: Diagnosis not present

## 2022-12-11 DIAGNOSIS — Z9181 History of falling: Secondary | ICD-10-CM | POA: Diagnosis not present

## 2022-12-11 DIAGNOSIS — K659 Peritonitis, unspecified: Secondary | ICD-10-CM | POA: Diagnosis not present

## 2022-12-11 DIAGNOSIS — E669 Obesity, unspecified: Secondary | ICD-10-CM | POA: Diagnosis not present

## 2022-12-11 DIAGNOSIS — J45909 Unspecified asthma, uncomplicated: Secondary | ICD-10-CM | POA: Diagnosis not present

## 2022-12-13 DIAGNOSIS — D62 Acute posthemorrhagic anemia: Secondary | ICD-10-CM | POA: Diagnosis not present

## 2022-12-13 DIAGNOSIS — Z9181 History of falling: Secondary | ICD-10-CM | POA: Diagnosis not present

## 2022-12-13 DIAGNOSIS — K659 Peritonitis, unspecified: Secondary | ICD-10-CM | POA: Diagnosis not present

## 2022-12-13 DIAGNOSIS — A419 Sepsis, unspecified organism: Secondary | ICD-10-CM | POA: Diagnosis not present

## 2022-12-13 DIAGNOSIS — Z90710 Acquired absence of both cervix and uterus: Secondary | ICD-10-CM | POA: Diagnosis not present

## 2022-12-13 DIAGNOSIS — Z6836 Body mass index (BMI) 36.0-36.9, adult: Secondary | ICD-10-CM | POA: Diagnosis not present

## 2022-12-13 DIAGNOSIS — K219 Gastro-esophageal reflux disease without esophagitis: Secondary | ICD-10-CM | POA: Diagnosis not present

## 2022-12-13 DIAGNOSIS — Z792 Long term (current) use of antibiotics: Secondary | ICD-10-CM | POA: Diagnosis not present

## 2022-12-13 DIAGNOSIS — K9189 Other postprocedural complications and disorders of digestive system: Secondary | ICD-10-CM | POA: Diagnosis not present

## 2022-12-13 DIAGNOSIS — E119 Type 2 diabetes mellitus without complications: Secondary | ICD-10-CM | POA: Diagnosis not present

## 2022-12-13 DIAGNOSIS — T8144XA Sepsis following a procedure, initial encounter: Secondary | ICD-10-CM | POA: Diagnosis not present

## 2022-12-13 DIAGNOSIS — Z7984 Long term (current) use of oral hypoglycemic drugs: Secondary | ICD-10-CM | POA: Diagnosis not present

## 2022-12-13 DIAGNOSIS — E669 Obesity, unspecified: Secondary | ICD-10-CM | POA: Diagnosis not present

## 2022-12-13 DIAGNOSIS — I10 Essential (primary) hypertension: Secondary | ICD-10-CM | POA: Diagnosis not present

## 2022-12-13 DIAGNOSIS — K631 Perforation of intestine (nontraumatic): Secondary | ICD-10-CM | POA: Diagnosis not present

## 2022-12-13 DIAGNOSIS — J45909 Unspecified asthma, uncomplicated: Secondary | ICD-10-CM | POA: Diagnosis not present

## 2022-12-14 ENCOUNTER — Telehealth: Payer: Self-pay

## 2022-12-14 NOTE — Telephone Encounter (Signed)
Patient concerned of copay cost with repeat CT scan. Patient has follow up appointment scheduled on 8/20 and will run out of antibiotics prior to this appointment. Patient does not prefer anther CT scan if not needed. Patient requesting refills if needed.  Per patient less form is needed for her NPWT to wound. Routing to provider for advise.   Valarie Cones, LPN

## 2022-12-15 DIAGNOSIS — T8144XA Sepsis following a procedure, initial encounter: Secondary | ICD-10-CM | POA: Diagnosis not present

## 2022-12-15 DIAGNOSIS — E119 Type 2 diabetes mellitus without complications: Secondary | ICD-10-CM | POA: Diagnosis not present

## 2022-12-15 DIAGNOSIS — K631 Perforation of intestine (nontraumatic): Secondary | ICD-10-CM | POA: Diagnosis not present

## 2022-12-15 DIAGNOSIS — T8131XD Disruption of external operation (surgical) wound, not elsewhere classified, subsequent encounter: Secondary | ICD-10-CM | POA: Diagnosis not present

## 2022-12-15 DIAGNOSIS — J45909 Unspecified asthma, uncomplicated: Secondary | ICD-10-CM | POA: Diagnosis not present

## 2022-12-15 DIAGNOSIS — I10 Essential (primary) hypertension: Secondary | ICD-10-CM | POA: Diagnosis not present

## 2022-12-15 DIAGNOSIS — E669 Obesity, unspecified: Secondary | ICD-10-CM | POA: Diagnosis not present

## 2022-12-15 DIAGNOSIS — A419 Sepsis, unspecified organism: Secondary | ICD-10-CM | POA: Diagnosis not present

## 2022-12-15 DIAGNOSIS — K659 Peritonitis, unspecified: Secondary | ICD-10-CM | POA: Diagnosis not present

## 2022-12-15 DIAGNOSIS — K9189 Other postprocedural complications and disorders of digestive system: Secondary | ICD-10-CM | POA: Diagnosis not present

## 2022-12-15 DIAGNOSIS — D62 Acute posthemorrhagic anemia: Secondary | ICD-10-CM | POA: Diagnosis not present

## 2022-12-18 DIAGNOSIS — K631 Perforation of intestine (nontraumatic): Secondary | ICD-10-CM | POA: Diagnosis not present

## 2022-12-18 DIAGNOSIS — A419 Sepsis, unspecified organism: Secondary | ICD-10-CM | POA: Diagnosis not present

## 2022-12-18 DIAGNOSIS — T8144XA Sepsis following a procedure, initial encounter: Secondary | ICD-10-CM | POA: Diagnosis not present

## 2022-12-18 DIAGNOSIS — E669 Obesity, unspecified: Secondary | ICD-10-CM | POA: Diagnosis not present

## 2022-12-18 DIAGNOSIS — D62 Acute posthemorrhagic anemia: Secondary | ICD-10-CM | POA: Diagnosis not present

## 2022-12-18 DIAGNOSIS — K9189 Other postprocedural complications and disorders of digestive system: Secondary | ICD-10-CM | POA: Diagnosis not present

## 2022-12-18 DIAGNOSIS — E119 Type 2 diabetes mellitus without complications: Secondary | ICD-10-CM | POA: Diagnosis not present

## 2022-12-18 DIAGNOSIS — J45909 Unspecified asthma, uncomplicated: Secondary | ICD-10-CM | POA: Diagnosis not present

## 2022-12-18 DIAGNOSIS — I10 Essential (primary) hypertension: Secondary | ICD-10-CM | POA: Diagnosis not present

## 2022-12-18 DIAGNOSIS — K659 Peritonitis, unspecified: Secondary | ICD-10-CM | POA: Diagnosis not present

## 2022-12-20 DIAGNOSIS — A419 Sepsis, unspecified organism: Secondary | ICD-10-CM | POA: Diagnosis not present

## 2022-12-20 DIAGNOSIS — K631 Perforation of intestine (nontraumatic): Secondary | ICD-10-CM | POA: Diagnosis not present

## 2022-12-20 DIAGNOSIS — D62 Acute posthemorrhagic anemia: Secondary | ICD-10-CM | POA: Diagnosis not present

## 2022-12-20 DIAGNOSIS — K9189 Other postprocedural complications and disorders of digestive system: Secondary | ICD-10-CM | POA: Diagnosis not present

## 2022-12-20 DIAGNOSIS — I10 Essential (primary) hypertension: Secondary | ICD-10-CM | POA: Diagnosis not present

## 2022-12-20 DIAGNOSIS — E669 Obesity, unspecified: Secondary | ICD-10-CM | POA: Diagnosis not present

## 2022-12-20 DIAGNOSIS — T8144XA Sepsis following a procedure, initial encounter: Secondary | ICD-10-CM | POA: Diagnosis not present

## 2022-12-20 DIAGNOSIS — J45909 Unspecified asthma, uncomplicated: Secondary | ICD-10-CM | POA: Diagnosis not present

## 2022-12-20 DIAGNOSIS — E119 Type 2 diabetes mellitus without complications: Secondary | ICD-10-CM | POA: Diagnosis not present

## 2022-12-20 DIAGNOSIS — K659 Peritonitis, unspecified: Secondary | ICD-10-CM | POA: Diagnosis not present

## 2022-12-21 NOTE — Telephone Encounter (Signed)
Patient called back wanting to confirm that she would like to refrain from a CT at this time due to cost. She has a few days of Augmentin and fluconazole left. Advised Dr. Daiva Eves will observe her off antibiotics. Follow up is scheduled for 8/20.  Sandie Ano, RN

## 2022-12-22 DIAGNOSIS — A419 Sepsis, unspecified organism: Secondary | ICD-10-CM | POA: Diagnosis not present

## 2022-12-22 DIAGNOSIS — K659 Peritonitis, unspecified: Secondary | ICD-10-CM | POA: Diagnosis not present

## 2022-12-22 DIAGNOSIS — E119 Type 2 diabetes mellitus without complications: Secondary | ICD-10-CM | POA: Diagnosis not present

## 2022-12-22 DIAGNOSIS — T8131XD Disruption of external operation (surgical) wound, not elsewhere classified, subsequent encounter: Secondary | ICD-10-CM | POA: Diagnosis not present

## 2022-12-22 DIAGNOSIS — K9189 Other postprocedural complications and disorders of digestive system: Secondary | ICD-10-CM | POA: Diagnosis not present

## 2022-12-22 DIAGNOSIS — D62 Acute posthemorrhagic anemia: Secondary | ICD-10-CM | POA: Diagnosis not present

## 2022-12-22 DIAGNOSIS — T8144XA Sepsis following a procedure, initial encounter: Secondary | ICD-10-CM | POA: Diagnosis not present

## 2022-12-22 DIAGNOSIS — E669 Obesity, unspecified: Secondary | ICD-10-CM | POA: Diagnosis not present

## 2022-12-22 DIAGNOSIS — K631 Perforation of intestine (nontraumatic): Secondary | ICD-10-CM | POA: Diagnosis not present

## 2022-12-22 DIAGNOSIS — I10 Essential (primary) hypertension: Secondary | ICD-10-CM | POA: Diagnosis not present

## 2022-12-22 DIAGNOSIS — J45909 Unspecified asthma, uncomplicated: Secondary | ICD-10-CM | POA: Diagnosis not present

## 2022-12-24 DIAGNOSIS — T8131XD Disruption of external operation (surgical) wound, not elsewhere classified, subsequent encounter: Secondary | ICD-10-CM | POA: Diagnosis not present

## 2022-12-25 DIAGNOSIS — I10 Essential (primary) hypertension: Secondary | ICD-10-CM | POA: Diagnosis not present

## 2022-12-25 DIAGNOSIS — K659 Peritonitis, unspecified: Secondary | ICD-10-CM | POA: Diagnosis not present

## 2022-12-25 DIAGNOSIS — J45909 Unspecified asthma, uncomplicated: Secondary | ICD-10-CM | POA: Diagnosis not present

## 2022-12-25 DIAGNOSIS — T8144XA Sepsis following a procedure, initial encounter: Secondary | ICD-10-CM | POA: Diagnosis not present

## 2022-12-25 DIAGNOSIS — E669 Obesity, unspecified: Secondary | ICD-10-CM | POA: Diagnosis not present

## 2022-12-25 DIAGNOSIS — D62 Acute posthemorrhagic anemia: Secondary | ICD-10-CM | POA: Diagnosis not present

## 2022-12-25 DIAGNOSIS — E119 Type 2 diabetes mellitus without complications: Secondary | ICD-10-CM | POA: Diagnosis not present

## 2022-12-25 DIAGNOSIS — K9189 Other postprocedural complications and disorders of digestive system: Secondary | ICD-10-CM | POA: Diagnosis not present

## 2022-12-25 DIAGNOSIS — K631 Perforation of intestine (nontraumatic): Secondary | ICD-10-CM | POA: Diagnosis not present

## 2022-12-25 DIAGNOSIS — A419 Sepsis, unspecified organism: Secondary | ICD-10-CM | POA: Diagnosis not present

## 2022-12-27 DIAGNOSIS — J45909 Unspecified asthma, uncomplicated: Secondary | ICD-10-CM | POA: Diagnosis not present

## 2022-12-27 DIAGNOSIS — E669 Obesity, unspecified: Secondary | ICD-10-CM | POA: Diagnosis not present

## 2022-12-27 DIAGNOSIS — A419 Sepsis, unspecified organism: Secondary | ICD-10-CM | POA: Diagnosis not present

## 2022-12-27 DIAGNOSIS — I10 Essential (primary) hypertension: Secondary | ICD-10-CM | POA: Diagnosis not present

## 2022-12-27 DIAGNOSIS — E119 Type 2 diabetes mellitus without complications: Secondary | ICD-10-CM | POA: Diagnosis not present

## 2022-12-27 DIAGNOSIS — K659 Peritonitis, unspecified: Secondary | ICD-10-CM | POA: Diagnosis not present

## 2022-12-27 DIAGNOSIS — D62 Acute posthemorrhagic anemia: Secondary | ICD-10-CM | POA: Diagnosis not present

## 2022-12-27 DIAGNOSIS — K9189 Other postprocedural complications and disorders of digestive system: Secondary | ICD-10-CM | POA: Diagnosis not present

## 2022-12-27 DIAGNOSIS — K631 Perforation of intestine (nontraumatic): Secondary | ICD-10-CM | POA: Diagnosis not present

## 2022-12-27 DIAGNOSIS — T8144XA Sepsis following a procedure, initial encounter: Secondary | ICD-10-CM | POA: Diagnosis not present

## 2022-12-29 DIAGNOSIS — D62 Acute posthemorrhagic anemia: Secondary | ICD-10-CM | POA: Diagnosis not present

## 2022-12-29 DIAGNOSIS — I10 Essential (primary) hypertension: Secondary | ICD-10-CM | POA: Diagnosis not present

## 2022-12-29 DIAGNOSIS — A419 Sepsis, unspecified organism: Secondary | ICD-10-CM | POA: Diagnosis not present

## 2022-12-29 DIAGNOSIS — E669 Obesity, unspecified: Secondary | ICD-10-CM | POA: Diagnosis not present

## 2022-12-29 DIAGNOSIS — T8144XA Sepsis following a procedure, initial encounter: Secondary | ICD-10-CM | POA: Diagnosis not present

## 2022-12-29 DIAGNOSIS — K631 Perforation of intestine (nontraumatic): Secondary | ICD-10-CM | POA: Diagnosis not present

## 2022-12-29 DIAGNOSIS — E119 Type 2 diabetes mellitus without complications: Secondary | ICD-10-CM | POA: Diagnosis not present

## 2022-12-29 DIAGNOSIS — J45909 Unspecified asthma, uncomplicated: Secondary | ICD-10-CM | POA: Diagnosis not present

## 2022-12-29 DIAGNOSIS — K9189 Other postprocedural complications and disorders of digestive system: Secondary | ICD-10-CM | POA: Diagnosis not present

## 2022-12-29 DIAGNOSIS — K659 Peritonitis, unspecified: Secondary | ICD-10-CM | POA: Diagnosis not present

## 2023-01-01 DIAGNOSIS — K659 Peritonitis, unspecified: Secondary | ICD-10-CM | POA: Diagnosis not present

## 2023-01-01 DIAGNOSIS — E669 Obesity, unspecified: Secondary | ICD-10-CM | POA: Diagnosis not present

## 2023-01-01 DIAGNOSIS — K9189 Other postprocedural complications and disorders of digestive system: Secondary | ICD-10-CM | POA: Diagnosis not present

## 2023-01-01 DIAGNOSIS — T8144XA Sepsis following a procedure, initial encounter: Secondary | ICD-10-CM | POA: Diagnosis not present

## 2023-01-01 DIAGNOSIS — D62 Acute posthemorrhagic anemia: Secondary | ICD-10-CM | POA: Diagnosis not present

## 2023-01-01 DIAGNOSIS — K631 Perforation of intestine (nontraumatic): Secondary | ICD-10-CM | POA: Diagnosis not present

## 2023-01-01 DIAGNOSIS — J45909 Unspecified asthma, uncomplicated: Secondary | ICD-10-CM | POA: Diagnosis not present

## 2023-01-01 DIAGNOSIS — A419 Sepsis, unspecified organism: Secondary | ICD-10-CM | POA: Diagnosis not present

## 2023-01-01 DIAGNOSIS — I10 Essential (primary) hypertension: Secondary | ICD-10-CM | POA: Diagnosis not present

## 2023-01-01 DIAGNOSIS — E119 Type 2 diabetes mellitus without complications: Secondary | ICD-10-CM | POA: Diagnosis not present

## 2023-01-02 ENCOUNTER — Encounter: Payer: Self-pay | Admitting: Infectious Disease

## 2023-01-02 ENCOUNTER — Ambulatory Visit (INDEPENDENT_AMBULATORY_CARE_PROVIDER_SITE_OTHER): Payer: 59 | Admitting: Infectious Disease

## 2023-01-02 ENCOUNTER — Other Ambulatory Visit: Payer: Self-pay

## 2023-01-02 VITALS — BP 140/88 | HR 87 | Temp 98.3°F | Wt 214.0 lb

## 2023-01-02 DIAGNOSIS — Z8719 Personal history of other diseases of the digestive system: Secondary | ICD-10-CM

## 2023-01-02 DIAGNOSIS — Z9889 Other specified postprocedural states: Secondary | ICD-10-CM | POA: Diagnosis not present

## 2023-01-02 DIAGNOSIS — K56609 Unspecified intestinal obstruction, unspecified as to partial versus complete obstruction: Secondary | ICD-10-CM

## 2023-01-02 DIAGNOSIS — L0291 Cutaneous abscess, unspecified: Secondary | ICD-10-CM | POA: Diagnosis not present

## 2023-01-02 DIAGNOSIS — K659 Peritonitis, unspecified: Secondary | ICD-10-CM | POA: Diagnosis not present

## 2023-01-02 DIAGNOSIS — K631 Perforation of intestine (nontraumatic): Secondary | ICD-10-CM

## 2023-01-02 DIAGNOSIS — I10 Essential (primary) hypertension: Secondary | ICD-10-CM | POA: Diagnosis not present

## 2023-01-02 HISTORY — DX: Cutaneous abscess, unspecified: L02.91

## 2023-01-02 NOTE — Progress Notes (Signed)
Chief complaint follow-up for intra-abdominal abscess  Subjective:    Patient ID: Kaitlyn Bray, female    DOB: 10-06-1965, 57 y.o.   MRN: [id number]  HPI   57 y.o. female who developed post robotic incisional hernia repair, feculent peritonitis obotic incisional hernia repair with mesh ( vicryl mesh patch of posterior layer of repair) , removal of previous intraperitoneal mesh and lysis of adhesions, bilateral transversus abdominis and bilateral posterior rectus myofascial release on 6/10 who had been progressing well until started developing fever and drainage of foul-smelling fluid from her lower abdominal incision site She underwent OR for exploratory ex lap with resection of small bowel perforation and primary anastomosis and excision of mesh with placement of ABThera wound VAC on 6/18   She went back on 11/04/2022 and all prior mesh removed and absorbable mesh placed   Cultures with group G streptococci, actinomyecs, bacteroides ovatus beta lactamase positive and candida albicans    Some Zosyn to Unasyn of continuous fluconazole.   We obtained a CT scan of the abdomen pelvis to make sure she did not have any other known abscesses.   CT scan shows a loculated fluid collection in the anterior lower abdomen that radiology felt could be due to developing abscess.   It apparently may communicate with the anterior pelvic wall open wound.   There is other small loculated collections in the lower abdomen with 2 drainage catheters terminating in the left hemipelvis.   Dr.Stechschulte has reviewed and feels that collection seen on CT are fairly expected findings and not likely to be infected.  He did not want to aspirate or placed drains in them as they lie underneath the mesh in the mesentery.  I had wanted to have a month of fluconazole and Augmentin with a CT scan in the interim showing resolution of the fluid collection but she went ahead and finish the Augmentin and fluconazole but did  not get CT scan as she was concerned about the cost.  The last time she had an outpatient CT scan she had to pay $700 upfront.  She has been following closely with surgery and not had any evidence of recurrence of her infection after finishing Augmentin and fluconazole she has not had fevers chills or worsening abdominal pain she has suffered some diarrhea that she partly attributed Augmentin and now metformin.    Past Medical History:  Diagnosis Date   Asthma    Complication of anesthesia    Heart murmur    Hernia    Hypertension    New onset type 2 diabetes mellitus (HCC) 09/15/2022   PONV (postoperative nausea and vomiting)    Pre-diabetes    S/P hernia repair 08/26/2021   SBO (small bowel obstruction) (HCC) 09/15/2021    Past Surgical History:  Procedure Laterality Date   ABDOMINAL HYSTERECTOMY     one ovary stlil in place. removed uterus cervix and pt thinks fallopian tubes   APPENDECTOMY     APPLICATION OF WOUND VAC N/A 11/04/2022   Procedure: APPLICATION OF WOUND VAC;  Surgeon: [name], MD;  Location: [facility];  Service: General;  Laterality: N/A;   CESAREAN SECTION     DEBRIDEMENT AND CLOSURE WOUND N/A 11/04/2022   Procedure: ABSORBABLE MESH CLOSURE OF ABDOMEN;  Surgeon: [name], MD;  Location: [facility];  Service: General;  Laterality: N/A;   INCISIONAL HERNIA REPAIR N/A 08/26/2021   Procedure: INCISIONAL HERNIA REPAIR;  Surgeon: [name], MD;  Location: [facility] OR;  Service: General;  Laterality: N/A;   INSERTION OF MESH N/A 08/26/2021   Procedure: INSERTION OF MESH;  Surgeon: [name], MD;  Location: [facility] OR;  Service: General;  Laterality: N/A;   LAPAROTOMY N/A 07/29/2020   Procedure: EXPLORATORY LAPAROTOMY, REPAIR OF VENTRAL HERNIA;  Surgeon: [name], MD;  Location: [facility] OR;  Service: General;  Laterality: N/A;   LAPAROTOMY N/A 10/29/2022   Procedure: EXPLORATORY LAPAROTOMY WITH REPAIR OF SMALL BOWEL PERFORATION, AND PLACEMENT OF WOUND VAC;   Surgeon: Harriette Bouillon, MD;  Location: [facility];  Service: General;  Laterality: N/A;   LAPAROTOMY N/A 10/31/2022   Procedure: EXPLORATORY LAPAROTOMY - WASH OUT OF ABDOMEN AND WOUND VAC PLACEMENT;  Surgeon: Abigail Miyamoto, MD;  Location: [facility];  Service: General;  Laterality: N/A;   LAPAROTOMY N/A 11/02/2022   Procedure: EXPLORATORY LAPAROTOMY Wound wash out of abdomen and wound vac change;  Surgeon: Abigail Miyamoto, MD;  Location: [facility];  Service: General;  Laterality: N/A;   MYOMECTOMY     XI ROBOTIC ASSISTED VENTRAL HERNIA N/A 10/23/2022   Procedure: ROBOTIC RECURRENT INCISIONAL HERNIA REPAIR WITH MESH WITH BILATERAL TRANVERSUS ABDOMINUS MYOFACIAL RELEASE AND BILATERAL POSTERIOR RECTUS MYOFACIAL RELEASE;  Surgeon: [name], MD;  Location: [facility];  Service: General;  Laterality: N/A;    Family History  Problem Relation Age of Onset   Hypertension Mother    Stroke Mother    Dementia Mother    Heart disease Mother    Hyperlipidemia Mother    COPD Father    Rheum arthritis Father    Mental illness Father    Heart disease Maternal Grandmother    Heart disease Maternal Grandfather    Alzheimer's disease Maternal Grandfather    Uterine cancer Paternal Grandmother       Social History   Socioeconomic History   Marital status: Married    Spouse name: Not on file   Number of children: 1   Years of education: Not on file   Highest education level: Not on file  Occupational History   Occupation: daycare    Comment: [employer]  Tobacco Use   Smoking status: Never   Smokeless tobacco: Never  Vaping Use   Vaping status: Never Used  Substance and Sexual Activity   Alcohol use: No   Drug use: No   Sexual activity: Not Currently    Partners: Male    Birth control/protection: Surgical  Other Topics Concern   Not on file  Social History Narrative   One 57 y/o boy    Lives with husband and son    Cats outside   Social Determinants of Health   Financial  Resource Strain: Not on file  Food Insecurity: No Food Insecurity (10/23/2022)   Hunger Vital Sign    Worried About Running Out of Food in the Last Year: Never true    Ran Out of Food in the Last Year: Never true  Transportation Needs: No Transportation Needs (10/23/2022)   PRAPARE - Administrator, Civil Service (Medical): No    Lack of Transportation (Non-Medical): No  Physical Activity: Not on file  Stress: Not on file  Social Connections: Not on file    Allergies  Allergen Reactions   Latex Other (See Comments)    Redness from bandaids   Codeine Palpitations    Tachycardia    Sulfa Drugs Cross Reactors Rash     Current Outpatient Medications:    acetaminophen (TYLENOL) 325 MG tablet, Take 1-2 tablets (325-650  mg total) by mouth every 4 (four) hours as needed for mild pain., Disp: , Rfl:    Ascorbic Acid (VITAMIN C PO), Take 1 capsule by mouth daily., Disp: , Rfl:    Cholecalciferol (VITAMIN D-3 PO), Take 1 capsule by mouth daily., Disp: , Rfl:    Cyanocobalamin (B-12 PO), Take 1 tablet by mouth daily., Disp: , Rfl:    famotidine (PEPCID) 20 MG tablet, Take 1 tablet (20 mg total) by mouth 2 (two) times daily., Disp: 60 tablet, Rfl: 0   loratadine (CLARITIN) 10 MG tablet, Take 1 tablet (10 mg total) by mouth daily., Disp: 100 tablet, Rfl: 0   losartan-hydrochlorothiazide (HYZAAR) 50-12.5 MG tablet, TAKE 1 TABLET BY MOUTH EVERY DAY, Disp: 90 tablet, Rfl: 3   meclizine (ANTIVERT) 25 MG tablet, Take 1 tablet (25 mg total) by mouth 3 (three) times daily as needed for dizziness., Disp: 30 tablet, Rfl: 0   metFORMIN (GLUCOPHAGE-XR) 500 MG 24 hr tablet, Take 1 tablet (500 mg total) by mouth 2 (two) times daily., Disp: 60 tablet, Rfl: 0   Multiple Vitamins-Minerals (ZINC PO), Take 1 tablet by mouth daily., Disp: , Rfl:    oxyCODONE (OXY IR/ROXICODONE) 5 MG immediate release tablet, Take 1 tablet (5 mg total) by mouth every 4 (four) hours as needed for moderate pain., Disp: 30  tablet, Rfl: 0   gabapentin (NEURONTIN) 400 MG capsule, Take 1 capsule (400 mg total) by mouth 3 (three) times daily. (Patient not taking: Reported on 01/02/2023), Disp: 90 capsule, Rfl: 0   polycarbophil (FIBERCON) 625 MG tablet, Take 1 tablet (625 mg total) by mouth 2 (two) times daily. (Patient not taking: Reported on 01/02/2023), Disp: 60 tablet, Rfl: 0   polyethylene glycol (MIRALAX / GLYCOLAX) 17 g packet, Take 17 g by mouth daily. (Patient not taking: Reported on 01/02/2023), Disp: , Rfl:    Review of Systems  Constitutional:  Negative for activity change, appetite change, chills, diaphoresis, fatigue, fever and unexpected weight change.  HENT:  Negative for congestion, rhinorrhea, sinus pressure, sneezing, sore throat and trouble swallowing.   Eyes:  Negative for photophobia and visual disturbance.  Respiratory:  Negative for cough, chest tightness, shortness of breath, wheezing and stridor.   Cardiovascular:  Negative for chest pain, palpitations and leg swelling.  Gastrointestinal:  Positive for diarrhea. Negative for abdominal distention, abdominal pain, anal bleeding, blood in stool, constipation, nausea and vomiting.  Genitourinary:  Negative for difficulty urinating, dysuria, flank pain and hematuria.  Musculoskeletal:  Negative for arthralgias, back pain, gait problem, joint swelling and myalgias.  Skin:  Negative for color change, pallor, rash and wound.  Neurological:  Negative for dizziness, tremors, weakness and light-headedness.  Hematological:  Negative for adenopathy. Does not bruise/bleed easily.  Psychiatric/Behavioral:  Negative for agitation, behavioral problems, confusion, decreased concentration, dysphoric mood and sleep disturbance.        Objective:   Physical Exam Constitutional:      General: She is not in acute distress.    Appearance: Normal appearance. She is well-developed. She is not ill-appearing or diaphoretic.  HENT:     Head: Normocephalic and  atraumatic.     Right Ear: Hearing and external ear normal.     Left Ear: Hearing and external ear normal.     Nose: No nasal deformity or rhinorrhea.  Eyes:     General: No scleral icterus.    Conjunctiva/sclera: Conjunctivae normal.     Right eye: Right conjunctiva is not injected.     Left  eye: Left conjunctiva is not injected.     Pupils: Pupils are equal, round, and reactive to light.  Neck:     Vascular: No JVD.  Cardiovascular:     Rate and Rhythm: Normal rate and regular rhythm.     Heart sounds: S1 normal and S2 normal.  Pulmonary:     Effort: Pulmonary effort is normal. No respiratory distress.     Breath sounds: No wheezing.  Abdominal:     General: There is no distension.     Palpations: Abdomen is soft.  Musculoskeletal:        General: Normal range of motion.     Right shoulder: Normal.     Left shoulder: Normal.     Cervical back: Normal range of motion and neck supple.     Right hip: Normal.     Left hip: Normal.     Right knee: Normal.     Left knee: Normal.  Lymphadenopathy:     Head:     Right side of head: No submandibular, preauricular or posterior auricular adenopathy.     Left side of head: No submandibular, preauricular or posterior auricular adenopathy.     Cervical: No cervical adenopathy.     Right cervical: No superficial or deep cervical adenopathy.    Left cervical: No superficial or deep cervical adenopathy.  Skin:    General: Skin is warm and dry.     Coloration: Skin is not pale.     Findings: No abrasion, bruising, ecchymosis, erythema, lesion or rash.     Nails: There is no clubbing.  Neurological:     Mental Status: She is alert and oriented to person, place, and time.     Sensory: No sensory deficit.     Coordination: Coordination normal.     Gait: Gait normal.  Psychiatric:        Attention and Perception: She is attentive.        Speech: Speech normal.        Behavior: Behavior normal. Behavior is cooperative.        Thought  Content: Thought content normal.        Judgment: Judgment normal.   Vacuum in place        Assessment & Plan:   Intraabdominal abscess:  I had some concern about the residual fluid collection that was present and want a get a CT scan but she is concerned about the cost of this.  She has done well 2 weeks off antibiotics.  I will check a CBC and CMP.  If there is a reassuring she can continue to follow with surgery and we can hold off on imaging unless she shows signs and symptoms of recurrent infection.  I have personally spent 40 minutes involved in face-to-face and non-face-to-face activities for this patient on the day of the visit. Professional time spent includes the following activities: Preparing to see the patient (review of tests), Obtaining and/or reviewing separately obtained history (admission/discharge record), Performing a medically appropriate examination and/or evaluation , Ordering medications/tests/procedures, referring and communicating with other health care professionals, Documenting clinical information in the EMR, Independently interpreting results (not separately reported), Communicating results to the patient/family/caregiver, Counseling and educating the patient/family/caregiver and Care coordination (not separately reported).

## 2023-01-03 DIAGNOSIS — I10 Essential (primary) hypertension: Secondary | ICD-10-CM | POA: Diagnosis not present

## 2023-01-03 DIAGNOSIS — T8144XA Sepsis following a procedure, initial encounter: Secondary | ICD-10-CM | POA: Diagnosis not present

## 2023-01-03 DIAGNOSIS — A419 Sepsis, unspecified organism: Secondary | ICD-10-CM | POA: Diagnosis not present

## 2023-01-03 DIAGNOSIS — K9189 Other postprocedural complications and disorders of digestive system: Secondary | ICD-10-CM | POA: Diagnosis not present

## 2023-01-03 DIAGNOSIS — K659 Peritonitis, unspecified: Secondary | ICD-10-CM | POA: Diagnosis not present

## 2023-01-03 DIAGNOSIS — J45909 Unspecified asthma, uncomplicated: Secondary | ICD-10-CM | POA: Diagnosis not present

## 2023-01-03 DIAGNOSIS — D62 Acute posthemorrhagic anemia: Secondary | ICD-10-CM | POA: Diagnosis not present

## 2023-01-03 DIAGNOSIS — K631 Perforation of intestine (nontraumatic): Secondary | ICD-10-CM | POA: Diagnosis not present

## 2023-01-03 DIAGNOSIS — E669 Obesity, unspecified: Secondary | ICD-10-CM | POA: Diagnosis not present

## 2023-01-03 DIAGNOSIS — E119 Type 2 diabetes mellitus without complications: Secondary | ICD-10-CM | POA: Diagnosis not present

## 2023-01-03 LAB — COMPLETE METABOLIC PANEL WITH GFR
AG Ratio: 1.4 (calc) (ref 1.0–2.5)
ALT: 13 U/L (ref 6–29)
AST: 11 U/L (ref 10–35)
Albumin: 3.9 g/dL (ref 3.6–5.1)
Alkaline phosphatase (APISO): 76 U/L (ref 37–153)
BUN: 8 mg/dL (ref 7–25)
CO2: 29 mmol/L (ref 20–32)
Calcium: 9.6 mg/dL (ref 8.6–10.4)
Chloride: 101 mmol/L (ref 98–110)
Creat: 0.5 mg/dL (ref 0.50–1.03)
Globulin: 2.7 g/dL (calc) (ref 1.9–3.7)
Glucose, Bld: 96 mg/dL (ref 65–99)
Potassium: 3.6 mmol/L (ref 3.5–5.3)
Sodium: 141 mmol/L (ref 135–146)
Total Bilirubin: 0.2 mg/dL (ref 0.2–1.2)
Total Protein: 6.6 g/dL (ref 6.1–8.1)
eGFR: 109 mL/min/{1.73_m2} (ref >=60)

## 2023-01-03 LAB — CBC WITH DIFFERENTIAL/PLATELET
Absolute Monocytes: 531 cells/uL (ref 200–950)
Basophils Absolute: 58 {cells}/uL (ref 0–200)
Basophils Relative: 0.7 %
Eosinophils Absolute: 307 {cells}/uL (ref 15–500)
Eosinophils Relative: 3.7 %
HCT: 37.3 % (ref 35.0–45.0)
Hemoglobin: 11.6 g/dL — ABNORMAL LOW (ref 11.7–15.5)
Lymphs Abs: 2166 {cells}/uL (ref 850–3900)
MCH: 25.6 pg — ABNORMAL LOW (ref 27.0–33.0)
MCHC: 31.1 g/dL — ABNORMAL LOW (ref 32.0–36.0)
MCV: 82.3 fL (ref 80.0–100.0)
MPV: 12.3 fL (ref 7.5–12.5)
Monocytes Relative: 6.4 %
Neutro Abs: 5237 {cells}/uL (ref 1500–7800)
Neutrophils Relative %: 63.1 %
Platelets: 279 10*3/uL (ref 140–400)
RBC: 4.53 10*6/uL (ref 3.80–5.10)
RDW: 14.5 % (ref 11.0–15.0)
Total Lymphocyte: 26.1 %
WBC: 8.3 10*3/uL (ref 3.8–10.8)

## 2023-01-05 DIAGNOSIS — K659 Peritonitis, unspecified: Secondary | ICD-10-CM | POA: Diagnosis not present

## 2023-01-05 DIAGNOSIS — K9189 Other postprocedural complications and disorders of digestive system: Secondary | ICD-10-CM | POA: Diagnosis not present

## 2023-01-05 DIAGNOSIS — A419 Sepsis, unspecified organism: Secondary | ICD-10-CM | POA: Diagnosis not present

## 2023-01-05 DIAGNOSIS — D62 Acute posthemorrhagic anemia: Secondary | ICD-10-CM | POA: Diagnosis not present

## 2023-01-05 DIAGNOSIS — K631 Perforation of intestine (nontraumatic): Secondary | ICD-10-CM | POA: Diagnosis not present

## 2023-01-05 DIAGNOSIS — E669 Obesity, unspecified: Secondary | ICD-10-CM | POA: Diagnosis not present

## 2023-01-05 DIAGNOSIS — E119 Type 2 diabetes mellitus without complications: Secondary | ICD-10-CM | POA: Diagnosis not present

## 2023-01-05 DIAGNOSIS — T8144XA Sepsis following a procedure, initial encounter: Secondary | ICD-10-CM | POA: Diagnosis not present

## 2023-01-05 DIAGNOSIS — I10 Essential (primary) hypertension: Secondary | ICD-10-CM | POA: Diagnosis not present

## 2023-01-05 DIAGNOSIS — J45909 Unspecified asthma, uncomplicated: Secondary | ICD-10-CM | POA: Diagnosis not present

## 2023-01-08 DIAGNOSIS — A419 Sepsis, unspecified organism: Secondary | ICD-10-CM | POA: Diagnosis not present

## 2023-01-08 DIAGNOSIS — E669 Obesity, unspecified: Secondary | ICD-10-CM | POA: Diagnosis not present

## 2023-01-08 DIAGNOSIS — K9189 Other postprocedural complications and disorders of digestive system: Secondary | ICD-10-CM | POA: Diagnosis not present

## 2023-01-08 DIAGNOSIS — I10 Essential (primary) hypertension: Secondary | ICD-10-CM | POA: Diagnosis not present

## 2023-01-08 DIAGNOSIS — J45909 Unspecified asthma, uncomplicated: Secondary | ICD-10-CM | POA: Diagnosis not present

## 2023-01-08 DIAGNOSIS — K659 Peritonitis, unspecified: Secondary | ICD-10-CM | POA: Diagnosis not present

## 2023-01-08 DIAGNOSIS — E119 Type 2 diabetes mellitus without complications: Secondary | ICD-10-CM | POA: Diagnosis not present

## 2023-01-08 DIAGNOSIS — K631 Perforation of intestine (nontraumatic): Secondary | ICD-10-CM | POA: Diagnosis not present

## 2023-01-08 DIAGNOSIS — D62 Acute posthemorrhagic anemia: Secondary | ICD-10-CM | POA: Diagnosis not present

## 2023-01-08 DIAGNOSIS — T8144XA Sepsis following a procedure, initial encounter: Secondary | ICD-10-CM | POA: Diagnosis not present

## 2023-01-10 DIAGNOSIS — E669 Obesity, unspecified: Secondary | ICD-10-CM | POA: Diagnosis not present

## 2023-01-10 DIAGNOSIS — K631 Perforation of intestine (nontraumatic): Secondary | ICD-10-CM | POA: Diagnosis not present

## 2023-01-10 DIAGNOSIS — A419 Sepsis, unspecified organism: Secondary | ICD-10-CM | POA: Diagnosis not present

## 2023-01-10 DIAGNOSIS — J45909 Unspecified asthma, uncomplicated: Secondary | ICD-10-CM | POA: Diagnosis not present

## 2023-01-10 DIAGNOSIS — K659 Peritonitis, unspecified: Secondary | ICD-10-CM | POA: Diagnosis not present

## 2023-01-10 DIAGNOSIS — D62 Acute posthemorrhagic anemia: Secondary | ICD-10-CM | POA: Diagnosis not present

## 2023-01-10 DIAGNOSIS — T8144XA Sepsis following a procedure, initial encounter: Secondary | ICD-10-CM | POA: Diagnosis not present

## 2023-01-10 DIAGNOSIS — I10 Essential (primary) hypertension: Secondary | ICD-10-CM | POA: Diagnosis not present

## 2023-01-10 DIAGNOSIS — K9189 Other postprocedural complications and disorders of digestive system: Secondary | ICD-10-CM | POA: Diagnosis not present

## 2023-01-10 DIAGNOSIS — E119 Type 2 diabetes mellitus without complications: Secondary | ICD-10-CM | POA: Diagnosis not present

## 2023-01-11 ENCOUNTER — Telehealth: Payer: Self-pay | Admitting: Family

## 2023-01-11 NOTE — Telephone Encounter (Signed)
Noted  

## 2023-01-11 NOTE — Telephone Encounter (Signed)
Unable to reach pt, pts husband or pts son (DPR signed) and left v/m requesting pt to cb. Sending note to Hayden Pedro FNP and lsc triage.

## 2023-01-11 NOTE — Telephone Encounter (Signed)
FYI: This call has been transferred to Access Nurse. Once the result note has been entered staff can address the message at that time.  Patient called in with the following symptoms:  Red Word:elevated blood pressure, most recent reading 172/115, patient has taken losartan    Please advise at Mobile [phone] (mobile)  Message is routed to Provider Pool and [facility] Triage

## 2023-01-11 NOTE — Telephone Encounter (Signed)
I spoke with pt; pt said she has not gone to UC or ED; pt has rested this afternoon and BP now is 135/80 and pt feels fine. Pt will cb for appt if needed.sending note to Hayden Pedro FNP.

## 2023-01-17 DIAGNOSIS — K219 Gastro-esophageal reflux disease without esophagitis: Secondary | ICD-10-CM | POA: Diagnosis not present

## 2023-01-17 DIAGNOSIS — Z6836 Body mass index (BMI) 36.0-36.9, adult: Secondary | ICD-10-CM | POA: Diagnosis not present

## 2023-01-17 DIAGNOSIS — E669 Obesity, unspecified: Secondary | ICD-10-CM | POA: Diagnosis not present

## 2023-01-17 DIAGNOSIS — K9189 Other postprocedural complications and disorders of digestive system: Secondary | ICD-10-CM | POA: Diagnosis not present

## 2023-01-17 DIAGNOSIS — Z9181 History of falling: Secondary | ICD-10-CM | POA: Diagnosis not present

## 2023-01-17 DIAGNOSIS — K631 Perforation of intestine (nontraumatic): Secondary | ICD-10-CM | POA: Diagnosis not present

## 2023-01-17 DIAGNOSIS — Z7984 Long term (current) use of oral hypoglycemic drugs: Secondary | ICD-10-CM | POA: Diagnosis not present

## 2023-01-17 DIAGNOSIS — K659 Peritonitis, unspecified: Secondary | ICD-10-CM | POA: Diagnosis not present

## 2023-01-17 DIAGNOSIS — J45909 Unspecified asthma, uncomplicated: Secondary | ICD-10-CM | POA: Diagnosis not present

## 2023-01-17 DIAGNOSIS — E119 Type 2 diabetes mellitus without complications: Secondary | ICD-10-CM | POA: Diagnosis not present

## 2023-01-17 DIAGNOSIS — I10 Essential (primary) hypertension: Secondary | ICD-10-CM | POA: Diagnosis not present

## 2023-01-17 DIAGNOSIS — Z90710 Acquired absence of both cervix and uterus: Secondary | ICD-10-CM | POA: Diagnosis not present

## 2023-01-17 DIAGNOSIS — Z792 Long term (current) use of antibiotics: Secondary | ICD-10-CM | POA: Diagnosis not present

## 2023-01-17 DIAGNOSIS — T8144XA Sepsis following a procedure, initial encounter: Secondary | ICD-10-CM | POA: Diagnosis not present

## 2023-01-17 DIAGNOSIS — A419 Sepsis, unspecified organism: Secondary | ICD-10-CM | POA: Diagnosis not present

## 2023-01-17 DIAGNOSIS — D62 Acute posthemorrhagic anemia: Secondary | ICD-10-CM | POA: Diagnosis not present

## 2023-01-25 ENCOUNTER — Encounter: Payer: Self-pay | Admitting: Family

## 2023-01-25 ENCOUNTER — Ambulatory Visit (INDEPENDENT_AMBULATORY_CARE_PROVIDER_SITE_OTHER)
Admission: RE | Admit: 2023-01-25 | Discharge: 2023-01-25 | Disposition: A | Discharge status: Home / Self Care | Payer: 59 | Source: Ambulatory Visit | Attending: Family | Admitting: Family

## 2023-01-25 ENCOUNTER — Ambulatory Visit (INDEPENDENT_AMBULATORY_CARE_PROVIDER_SITE_OTHER): Payer: 59 | Admitting: Family

## 2023-01-25 VITALS — BP 114/78 | HR 91 | Temp 97.7°F | Ht 65.0 in | Wt 210.9 lb

## 2023-01-25 DIAGNOSIS — E118 Type 2 diabetes mellitus with unspecified complications: Secondary | ICD-10-CM

## 2023-01-25 DIAGNOSIS — L659 Nonscarring hair loss, unspecified: Secondary | ICD-10-CM

## 2023-01-25 DIAGNOSIS — M25512 Pain in left shoulder: Secondary | ICD-10-CM

## 2023-01-25 DIAGNOSIS — I10 Essential (primary) hypertension: Secondary | ICD-10-CM

## 2023-01-25 DIAGNOSIS — M25619 Stiffness of unspecified shoulder, not elsewhere classified: Secondary | ICD-10-CM | POA: Diagnosis not present

## 2023-01-25 DIAGNOSIS — E559 Vitamin D deficiency, unspecified: Secondary | ICD-10-CM

## 2023-01-25 DIAGNOSIS — D649 Anemia, unspecified: Secondary | ICD-10-CM

## 2023-01-25 LAB — CBC WITH DIFFERENTIAL/PLATELET
Basophils Absolute: 0.1 10*3/uL (ref 0.0–0.1)
Basophils Relative: 0.9 % (ref 0.0–3.0)
Eosinophils Absolute: 0.2 10*3/uL (ref 0.0–0.7)
Eosinophils Relative: 2.6 % (ref 0.0–5.0)
HCT: 38.8 % (ref 36.0–46.0)
Hemoglobin: 11.9 g/dL — ABNORMAL LOW (ref 12.0–15.0)
Lymphocytes Relative: 25.4 % (ref 12.0–46.0)
Lymphs Abs: 2.4 10*3/uL (ref 0.7–4.0)
MCHC: 30.8 g/dL (ref 30.0–36.0)
MCV: 82.4 fl (ref 78.0–100.0)
Monocytes Absolute: 0.6 10*3/uL (ref 0.1–1.0)
Monocytes Relative: 6.4 % (ref 3.0–12.0)
Neutro Abs: 6.1 10*3/uL (ref 1.4–7.7)
Neutrophils Relative %: 64.7 % (ref 43.0–77.0)
Platelets: 315 10*3/uL (ref 150.0–400.0)
RBC: 4.71 Mil/uL (ref 3.87–5.11)
RDW: 16.5 % — ABNORMAL HIGH (ref 11.5–15.5)
WBC: 9.4 10*3/uL (ref 4.0–10.5)

## 2023-01-25 LAB — T3, FREE: T3, Free: 3.7 pg/mL (ref 2.3–4.2)

## 2023-01-25 LAB — IBC + FERRITIN
Ferritin: 33.5 ng/mL (ref 10.0–291.0)
Iron: 39 ug/dL — ABNORMAL LOW (ref 42–145)
Saturation Ratios: 12 % — ABNORMAL LOW (ref 20.0–50.0)
TIBC: 324.8 ug/dL (ref 250.0–450.0)
Transferrin: 232 mg/dL (ref 212.0–360.0)

## 2023-01-25 LAB — VITAMIN B12: Vitamin B-12: 887 pg/mL (ref 211–911)

## 2023-01-25 LAB — T4, FREE: Free T4: 0.95 ng/dL (ref 0.60–1.60)

## 2023-01-25 LAB — VITAMIN D 25 HYDROXY (VIT D DEFICIENCY, FRACTURES): VITD: 17.69 ng/mL — ABNORMAL LOW (ref 30.00–100.00)

## 2023-01-25 LAB — TSH: TSH: 1.3 u[IU]/mL (ref 0.35–5.50)

## 2023-01-25 LAB — HEMOGLOBIN A1C: Hgb A1c MFr Bld: 6.2 % (ref 4.6–6.5)

## 2023-01-25 NOTE — Patient Instructions (Signed)
A referral was placed today for an orthopedist.  Please let us know if you have not heard back within 2 weeks about the referral.

## 2023-01-25 NOTE — Assessment & Plan Note (Signed)
Stable today. Continue to monitor.  ?

## 2023-01-25 NOTE — Progress Notes (Signed)
Established Patient Office Visit  Subjective:      CC:  Chief Complaint  Patient presents with   Issues with Blood Pressure    HPI: Kaitlyn Bray is a 57 y.o. female presenting on 01/25/2023 for Issues with Blood Pressure .  Lab Results  Component Value Date   HGBA1C 6.2 01/25/2023     Poor eating recently with high sodium items and has noticed her blood pressure was increased. She states today much better, is 114/78. She states this am when she checked it at home was 150/90 or so.   Sugars 125 or so fasting in the am.   Had underwent robotic recurrent ventral hernia repair with removal of mesh, complicated by bowel injury with return to surgery resulting in small bowel resection 10/29/22 , a wound vac was applied 11/04/22. Home health changing her wound vac dressing three times weekly. Last visit with surgeon was 01/11/23.   She states now has thinning hair and loss of 'gobs of hair' at time. Has had weight loss as well and thinks this is going on with everything recently.   Since discharge from hospital right lower leg feels different numb at times.   Wt Readings from Last 3 Encounters:  01/25/23 210 lb 14.4 oz (95.7 kg)  01/02/23 214 lb (97.1 kg)  11/30/22 220 lb (99.8 kg)   Left shoulder pain, hurt since leaving the hospital.  She states not any bette rand limited ROM.  Pain with rotation, worse with putting hand in the back as well.  When using her walker she thinks she put too much pressure on the walker and she felt the pain got a bit worse. Has been going on since July.     Social history:  Relevant past medical, surgical, family and social history reviewed and updated as indicated. Interim medical history since our last visit reviewed.  Allergies and medications reviewed and updated.  DATA REVIEWED: CHART IN EPIC     ROS: Negative unless specifically indicated above in HPI.    Current Outpatient Medications:    acetaminophen (TYLENOL) 325 MG  tablet, Take 1-2 tablets (325-650 mg total) by mouth every 4 (four) hours as needed for mild pain., Disp: , Rfl:    Ascorbic Acid (VITAMIN C PO), Take 1 capsule by mouth daily., Disp: , Rfl:    Cholecalciferol (VITAMIN D-3 PO), Take 1 capsule by mouth daily., Disp: , Rfl:    Cyanocobalamin (B-12 PO), Take 1 tablet by mouth daily., Disp: , Rfl:    loratadine (CLARITIN) 10 MG tablet, Take 1 tablet (10 mg total) by mouth daily., Disp: 100 tablet, Rfl: 0   losartan-hydrochlorothiazide (HYZAAR) 50-12.5 MG tablet, TAKE 1 TABLET BY MOUTH EVERY DAY, Disp: 90 tablet, Rfl: 3   meclizine (ANTIVERT) 25 MG tablet, Take 1 tablet (25 mg total) by mouth 3 (three) times daily as needed for dizziness., Disp: 30 tablet, Rfl: 0   metFORMIN (GLUCOPHAGE-XR) 500 MG 24 hr tablet, Take 1 tablet (500 mg total) by mouth 2 (two) times daily., Disp: 60 tablet, Rfl: 0   Multiple Vitamins-Minerals (ZINC PO), Take 1 tablet by mouth daily., Disp: , Rfl:    famotidine (PEPCID) 20 MG tablet, Take 1 tablet (20 mg total) by mouth 2 (two) times daily. (Patient not taking: Reported on 01/25/2023), Disp: 60 tablet, Rfl: 0      Objective:    BP 114/78 (BP Location: Right Arm, Patient Position: Sitting, Cuff Size: Large)   Pulse 91   Temp 97.7  F (36.5 C) (Temporal)   Ht 5\' 5"  (1.651 m)   Wt 210 lb 14.4 oz (95.7 kg)   SpO2 96%   BMI 35.10 kg/m   Wt Readings from Last 3 Encounters:  01/25/23 210 lb 14.4 oz (95.7 kg)  01/02/23 214 lb (97.1 kg)  11/30/22 220 lb (99.8 kg)    Physical Exam Constitutional:      General: She is not in acute distress.    Appearance: Normal appearance. She is normal weight. She is not ill-appearing, toxic-appearing or diaphoretic.  HENT:     Head: Normocephalic.  Cardiovascular:     Rate and Rhythm: Normal rate and regular rhythm.  Pulmonary:     Effort: Pulmonary effort is normal.     Breath sounds: Normal breath sounds.  Musculoskeletal:     Left shoulder: Decreased range of motion (pain  with external rotation, positive apley).     Comments: Pain on impingement  Neurological:     General: No focal deficit present.     Mental Status: She is alert and oriented to person, place, and time. Mental status is at baseline.  Psychiatric:        Mood and Affect: Mood normal.        Behavior: Behavior normal.        Thought Content: Thought content normal.        Judgment: Judgment normal.           Assessment & Plan:  Acute pain of left shoulder Assessment & Plan: R/o acute findings including fracture pending results.  Suspect rotator cuff sprain vs tear Ice/heat prn  Tylenol as needed Exercises printed pending xray results to keep mobility   Orders: -     DG Shoulder Left; Future -     Ambulatory referral to Orthopedic Surgery  Vitamin D deficiency -     VITAMIN D 25 Hydroxy (Vit-D Deficiency, Fractures)  Hair loss -     Thyroid Peroxidase Antibodies (TPO) (REFL) -     T3, free -     T4, free -     TSH  Anemia, unspecified type Assessment & Plan: New finding Ordering cbc and iron panel pending results.   Orders: -     CBC with Differential/Platelet -     IBC + Ferritin -     Vitamin B12  Controlled type 2 diabetes mellitus with complication, without long-term current use of insulin (HCC) Assessment & Plan: Uncontrolled Repeat A1c pending results.  Pt advised to:  Work on diabetic diet and exercise as tolerated. Yearly foot exam, and annual eye exam.    Orders: -     Hemoglobin A1c  Limited range of motion (ROM) of shoulder -     Ambulatory referral to Orthopedic Surgery  Primary hypertension Assessment & Plan: Stable today  Continue to monitor.     Pt with multiple concerns, due to time constraints pt with close f/u in one week to discuss further concerns.   Return in about 1 week (around 02/01/2023) for f/u review labs and multiple concerns.  Mort Sawyers, MSN, APRN, FNP-C [facility]

## 2023-01-26 ENCOUNTER — Other Ambulatory Visit: Payer: Self-pay | Admitting: Family

## 2023-01-26 DIAGNOSIS — E559 Vitamin D deficiency, unspecified: Secondary | ICD-10-CM

## 2023-01-26 DIAGNOSIS — M25619 Stiffness of unspecified shoulder, not elsewhere classified: Secondary | ICD-10-CM | POA: Insufficient documentation

## 2023-01-26 DIAGNOSIS — E118 Type 2 diabetes mellitus with unspecified complications: Secondary | ICD-10-CM | POA: Insufficient documentation

## 2023-01-26 DIAGNOSIS — M25512 Pain in left shoulder: Secondary | ICD-10-CM | POA: Insufficient documentation

## 2023-01-26 DIAGNOSIS — D649 Anemia, unspecified: Secondary | ICD-10-CM | POA: Insufficient documentation

## 2023-01-26 MED ORDER — CHOLECALCIFEROL 1.25 MG (50000 UT) PO TABS: 1.0000 | ORAL_TABLET | ORAL | 0 refills | Status: DC

## 2023-01-26 NOTE — Assessment & Plan Note (Signed)
R/o acute findings including fracture pending results.  Suspect rotator cuff sprain vs tear Ice/heat prn  Tylenol as needed Exercises printed pending xray results to keep mobility

## 2023-01-26 NOTE — Assessment & Plan Note (Signed)
Uncontrolled Repeat A1c pending results.  Pt advised to:  Work on diabetic diet and exercise as tolerated. Yearly foot exam, and annual eye exam.

## 2023-01-26 NOTE — Assessment & Plan Note (Signed)
New finding Ordering cbc and iron panel pending results.

## 2023-01-29 ENCOUNTER — Telehealth: Payer: Self-pay | Admitting: Family

## 2023-01-29 LAB — THYROID PEROXIDASE ANTIBODIES (TPO) (REFL): Thyroperoxidase Ab SerPl-aCnc: 47 [IU]/mL — ABNORMAL HIGH (ref <9)

## 2023-01-29 NOTE — Telephone Encounter (Signed)
Pt called wanting to schedule an cpe. Told pt first available slot is 11/26. Pt states she believes Dugal wanted to see her sooner than 2 months due to what was discuss during last ov on 9/12. Should pt wait til November or be squeezed in sooner? Call back # [phone]

## 2023-01-29 NOTE — Telephone Encounter (Signed)
Pt was advised from last visit to f/u one week as she had multiple concerns. This f/u is not for a CPE. She can schedule the CPE when next available, that is no rush however needs the f/u for her other concerns separate from a physical.

## 2023-01-30 NOTE — Telephone Encounter (Signed)
Called patient and not able to leave voicemail as it is full.

## 2023-02-12 ENCOUNTER — Encounter: Payer: Self-pay | Admitting: Family

## 2023-02-12 ENCOUNTER — Ambulatory Visit (INDEPENDENT_AMBULATORY_CARE_PROVIDER_SITE_OTHER): Payer: 59 | Admitting: Family

## 2023-02-12 VITALS — BP 134/88 | Temp 98.0°F | Ht 65.0 in | Wt 220.6 lb

## 2023-02-12 DIAGNOSIS — S31109A Unspecified open wound of abdominal wall, unspecified quadrant without penetration into peritoneal cavity, initial encounter: Secondary | ICD-10-CM | POA: Diagnosis not present

## 2023-02-12 DIAGNOSIS — M2550 Pain in unspecified joint: Secondary | ICD-10-CM | POA: Diagnosis not present

## 2023-02-12 DIAGNOSIS — Z7984 Long term (current) use of oral hypoglycemic drugs: Secondary | ICD-10-CM

## 2023-02-12 DIAGNOSIS — L0291 Cutaneous abscess, unspecified: Secondary | ICD-10-CM

## 2023-02-12 DIAGNOSIS — E118 Type 2 diabetes mellitus with unspecified complications: Secondary | ICD-10-CM

## 2023-02-12 DIAGNOSIS — E063 Autoimmune thyroiditis: Secondary | ICD-10-CM

## 2023-02-12 DIAGNOSIS — M25512 Pain in left shoulder: Secondary | ICD-10-CM

## 2023-02-12 LAB — SEDIMENTATION RATE: Sed Rate: 59 mm/h — ABNORMAL HIGH (ref 0–30)

## 2023-02-12 MED ORDER — MEDIPORE + PAD ADHESIVE DRESS 6"X6" PADS
1.0000 | MEDICATED_PAD | Freq: Every day | 3 refills | Status: DC
Start: 2023-02-12 — End: 2023-02-12

## 2023-02-12 MED ORDER — MEDIPORE + PAD ADHESIVE DRESS 6"X6" PADS
1.0000 | MEDICATED_PAD | Freq: Every day | 0 refills | Status: DC
Start: 2023-02-12 — End: 2023-12-03

## 2023-02-12 MED ORDER — CAREMATES NITRILE GLOVES XL MISC
1.0000 | Freq: Every day | 0 refills | Status: DC
Start: 2023-02-12 — End: 2023-12-03

## 2023-02-12 MED ORDER — MEDIPORE H SURGICAL 3"X10YD TAPE
1.0000 | MEDICATED_TAPE | Freq: Every day | 0 refills | Status: DC
Start: 2023-02-12 — End: 2023-12-03

## 2023-02-12 MED ORDER — MEDIPORE H SURGICAL 3"X10YD TAPE
1.0000 | MEDICATED_TAPE | Freq: Every day | 3 refills | Status: DC
Start: 2023-02-12 — End: 2023-02-12

## 2023-02-12 NOTE — Progress Notes (Addendum)
Established Patient Office Visit  Subjective:      CC:  Chief Complaint  Patient presents with  . Medical Management of Chronic Issues    HPI: Kaitlyn Bray is a 57 y.o. female presenting on 02/12/2023 for Medical Management of Chronic Issues . Hashimotos thyroid disease, without symptoms. Tsh free t3 and free t3 normal.   Polyarthralgia, dad had rheumatoid arthritis curious if she can be tested for this. Main joint concern is left shoulder, but this could also be attributed  Some mild stiffness in am, left greater than left.  Right foot numb at times, feels decreased sensation intermittently.  IDA: started taking iron tablets once daily.    Dressing changes: [facility] was sending them to her but she states her bill is behind and she can not afford the bill. She is currently using paper tape and gauze pads. She is requesting medipore pads and gauze.  She is worried because she is noticing some increased drainage, non tender. She is no longer receiving wound care from home health as they released her per her report. She states she is short on supplies and she can not afford anymore over the counter. She denies fever chills and or abdominal pain surrounding site other than 'typical' discomfort.     Social history:  Relevant past medical, surgical, family and social history reviewed and updated as indicated. Interim medical history since our last visit reviewed.  Allergies and medications reviewed and updated.  DATA REVIEWED: CHART IN EPIC     ROS: Negative unless specifically indicated above in HPI.    Current Outpatient Medications:  .  acetaminophen (TYLENOL) 325 MG tablet, Take 1-2 tablets (325-650 mg total) by mouth every 4 (four) hours as needed for mild pain., Disp: , Rfl:  .  Ascorbic Acid (VITAMIN C PO), Take 1 capsule by mouth daily., Disp: , Rfl:  .  Cholecalciferol (VITAMIN D-3 PO), Take 1 capsule by mouth daily., Disp: , Rfl:  .  Cholecalciferol 1.25  MG (50000 UT) TABS, Take 1 tablet by mouth once a week., Disp: 8 tablet, Rfl: 0 .  Cyanocobalamin (B-12 PO), Take 1 tablet by mouth daily., Disp: , Rfl:  .  Disposable Gloves (CAREMATES NITRILE GLOVES XL) MISC, 1 each by Does not apply route daily., Disp: 90 each, Rfl: 0 .  ferrous sulfate 324 MG TBEC, Take 324 mg by mouth every other day., Disp: , Rfl:  .  loratadine (CLARITIN) 10 MG tablet, Take 1 tablet (10 mg total) by mouth daily., Disp: 100 tablet, Rfl: 0 .  losartan-hydrochlorothiazide (HYZAAR) 50-12.5 MG tablet, TAKE 1 TABLET BY MOUTH EVERY DAY, Disp: 90 tablet, Rfl: 3 .  meclizine (ANTIVERT) 25 MG tablet, Take 1 tablet (25 mg total) by mouth 3 (three) times daily as needed for dizziness., Disp: 30 tablet, Rfl: 0 .  metFORMIN (GLUCOPHAGE-XR) 500 MG 24 hr tablet, Take 1 tablet (500 mg total) by mouth 2 (two) times daily., Disp: 60 tablet, Rfl: 0 .  Multiple Vitamins-Minerals (ZINC PO), Take 1 tablet by mouth daily., Disp: , Rfl:  .  Adhesive Tape (MEDIPORE H SURGICAL 3"X10YD) TAPE, 1 each by Does not apply route daily., Disp: 90 each, Rfl: 0 .  Gauze Pads & Dressings (MEDIPORE + PAD ADHESIVE DRESS) 6"X6" PADS, 1 each by Does not apply route daily., Disp: 90 each, Rfl: 0      Objective:    BP 134/88 (BP Location: Left Arm, Patient Position: Sitting, Cuff Size: Large)   Temp 98  F (36.7 C) (Temporal)   Ht 5\' 5"  (1.651 m)   Wt 220 lb 9.6 oz (100.1 kg)   SpO2 94%   BMI 36.71 kg/m   Wt Readings from Last 3 Encounters:  02/12/23 220 lb 9.6 oz (100.1 kg)  01/25/23 210 lb 14.4 oz (95.7 kg)  01/02/23 214 lb (97.1 kg)    Physical Exam Skin:    Findings: Wound (anterior middle abdomen with epithelial budding, clear drainage present.) present.          Assessment & Plan:  Hashimoto's disease  Abscess  Open wound of anterior abdominal wall, initial encounter Assessment & Plan: Assessed wound, no obvious signs of infection and appears to have epithelial growth.  Encouraged  that this is healing, however have advised to monitor site for worsening signs/symptoms of infection to include: increasing redness, increasing tenderness, increase in size, and or pustulant drainage from site. If this is to occur please let me know immediately. Continue with saline irrigation and wet to dry dressings as instructed per surgeon. Will attempt to create order for wound supplies, however if unsuccessful I have advised her to f/u with her surgeon for further direction for more specifics. I did advise her to f/u with surgeon in regards to wound ongoing.    Orders: -     CareMates Nitrile Gloves XL; 1 each by Does not apply route daily.  Dispense: 90 each; Refill: 0 -     Medipore H Surgical 3"x10yd; 1 each by Does not apply route daily.  Dispense: 90 each; Refill: 0 -     Medipore + Pad Adhesive Dress; 1 each by Does not apply route daily.  Dispense: 90 each; Refill: 0  Polyarthralgia -     ANA w/Reflex -     Rheumatoid factor -     Sedimentation rate  Acute pain of left shoulder Assessment & Plan: Pending orthopedist consult 02/26/23.  Continue with range of motion exercises   Controlled type 2 diabetes mellitus with complication, without long-term current use of insulin (HCC) Assessment & Plan: A1c improving. Ordered hga1c today pending results. Work on diabetic diet and exercise as tolerated. Yearly foot exam, and annual eye exam.         Return in about 6 months (around 08/12/2023) for f/u diabetes.  Mort Sawyers, MSN, APRN, FNP-C [facility]

## 2023-02-12 NOTE — Assessment & Plan Note (Signed)
Pending orthopedist consult 02/26/23.  Continue with range of motion exercises

## 2023-02-12 NOTE — Assessment & Plan Note (Signed)
A1c improving. Ordered hga1c today pending results. Work on diabetic diet and exercise as tolerated. Yearly foot exam, and annual eye exam.

## 2023-02-12 NOTE — Assessment & Plan Note (Signed)
Assessed wound, no obvious signs of infection and appears to have epithelial growth.  Encouraged that this is healing, however have advised to monitor site for worsening signs/symptoms of infection to include: increasing redness, increasing tenderness, increase in size, and or pustulant drainage from site. If this is to occur please let me know immediately. Continue with saline irrigation and wet to dry dressings as instructed per surgeon. Will attempt to create order for wound supplies, however if unsuccessful I have advised her to f/u with her surgeon for further direction for more specifics. I did advise her to f/u with surgeon in regards to wound ongoing.

## 2023-02-13 ENCOUNTER — Other Ambulatory Visit: Payer: Self-pay | Admitting: Family

## 2023-02-13 DIAGNOSIS — R768 Other specified abnormal immunological findings in serum: Secondary | ICD-10-CM

## 2023-02-13 DIAGNOSIS — M2550 Pain in unspecified joint: Secondary | ICD-10-CM

## 2023-02-13 LAB — ENA+DNA/DS+SJORGEN'S
ENA RNP Ab: 0.8 AI (ref 0.0–0.9)
ENA SM Ab Ser-aCnc: <0.2 AI (ref 0.0–0.9)
ENA SSA (RO) Ab: <0.2 AI (ref 0.0–0.9)
ENA SSB (LA) Ab: <0.2 AI (ref 0.0–0.9)
dsDNA Ab: 10 [IU]/mL — ABNORMAL HIGH (ref 0–9)

## 2023-02-13 LAB — RHEUMATOID FACTOR: Rheumatoid fact SerPl-aCnc: <10 [IU]/mL (ref <14)

## 2023-02-13 LAB — ANA W/REFLEX: Anti Nuclear Antibody (ANA): POSITIVE — AB

## 2023-02-16 ENCOUNTER — Other Ambulatory Visit: Payer: Self-pay | Admitting: Family

## 2023-02-16 DIAGNOSIS — E559 Vitamin D deficiency, unspecified: Secondary | ICD-10-CM

## 2023-02-16 DIAGNOSIS — T8131XD Disruption of external operation (surgical) wound, not elsewhere classified, subsequent encounter: Secondary | ICD-10-CM | POA: Diagnosis not present

## 2023-02-16 DIAGNOSIS — S31109A Unspecified open wound of abdominal wall, unspecified quadrant without penetration into peritoneal cavity, initial encounter: Secondary | ICD-10-CM | POA: Diagnosis not present

## 2023-02-26 ENCOUNTER — Encounter: Payer: Self-pay | Admitting: Orthopedic Surgery

## 2023-02-26 ENCOUNTER — Ambulatory Visit (INDEPENDENT_AMBULATORY_CARE_PROVIDER_SITE_OTHER): Payer: 59 | Admitting: Orthopedic Surgery

## 2023-02-26 DIAGNOSIS — M19012 Primary osteoarthritis, left shoulder: Secondary | ICD-10-CM | POA: Diagnosis not present

## 2023-02-26 NOTE — Progress Notes (Unsigned)
Office Visit Note   Patient: Kaitlyn Bray           Date of Birth: 06/01/1965           MRN: [id number] Visit Date: 02/26/2023 Requested by: Mort Sawyers, FNP [address] [place],  [place] [address] PCP: Mort Sawyers, FNP  Subjective: Chief Complaint  Patient presents with   Left Shoulder - Pain    HPI: Kaitlyn Bray is a 57 y.o. female who presents to the office reporting left shoulder pain since the hospital stay the summer.  She is actually intubated in the unit and stayed in the hospital for a month.  Also had to go to rehab.  She is right-hand dominant.  Reports left shoulder pain localizing to the [facility] joint.  Sometimes she was moved in a [place] lift which really compressed her shoulders.  States she has good and bad days.  Pain does not wake her from sleep.  She was walking with a walker until early September.  She currently has an open wound which is improving following hernia surgery.  Symptoms in the left shoulder ongoing for 3 months.  Right shoulder generally okay.  Patient does have diabetes and it is going a little "haywire" at this time.  Has been referred to rheumatology due to recent labs but it would be expected that her sed rate would be elevated with her current hernia situation..                ROS: All systems reviewed are negative as they relate to the chief complaint within the history of present illness.  Patient denies fevers or chills.  Assessment & Plan: Visit Diagnoses:  1. Arthritis of left acromioclavicular joint     Plan: Impression is left shoulder AC joint pain ongoing now for 3 months.  Could be rotator cuff or labral pathology but I think that the less likely.  Not a great candidate for injection at this time due to volatility of CBG.  Plan at this time is MRI arthrogram left shoulder to evaluate [facility] joint arthritis with possible rotator cuff tear repair.  Patient has modified her activities tried exercises and symptoms have been ongoing for 3  months.  Follow-up after that study.  Follow-Up Instructions: No follow-ups on file.   Orders:  No orders of the defined types were placed in this encounter.  No orders of the defined types were placed in this encounter.     Procedures: No procedures performed   Clinical Data: No additional findings.  Objective: Vital Signs: There were no vitals taken for this visit.  Physical Exam:  Constitutional: Patient appears well-developed HEENT:  Head: Normocephalic Eyes:EOM are normal Neck: Normal range of motion Cardiovascular: Normal rate Pulmonary/chest: Effort normal Neurologic: Patient is alert Skin: Skin is warm Psychiatric: Patient has normal mood and affect  Ortho Exam: Ortho exam demonstrates good cervical spine range of motion.  5 out of 5 grip EPL FPL interosseous wrist flexion extension bicep triceps and deltoid strength.  Does have AC joint tenderness on the left compared to the right.  Does have pain with crossarm adduction and a little bit of grinding in that left AC joint compared to the right AC joint.  Rotator cuff strength is generally intact infraspinatus supraspinatus and subscap testing bilaterally.  No restriction of external rotation of 15 degrees of abduction.  No coarse grinding or crepitus in the rotator cuff region with internal and external rotation of that  left arm.  Specialty Comments:  No specialty comments available.  Imaging: No results found.   PMFS History: Patient Active Problem List   Diagnosis Date Noted   Polyarthralgia 02/13/2023   Ds DNA antibody positive 02/13/2023   Hashimoto's disease 02/12/2023   Open wnd anterior abdomen 02/12/2023   Controlled type 2 diabetes mellitus with complication, without long-term current use of insulin (HCC) 01/26/2023   Limited range of motion (ROM) of shoulder 01/26/2023   Anemia 01/26/2023   Acute pain of left shoulder 01/26/2023   Debility 11/16/2022   Protein-calorie malnutrition, moderate  (HCC) 11/12/2022   Physical deconditioning 11/10/2022   Sepsis due to Candida species (HCC) 11/09/2022   Small bowel perforation (HCC) 11/02/2022   Peritonitis (HCC) 10/29/2022   Aortic atherosclerosis (HCC) 01/17/2022   Morbid obesity (HCC) 01/17/2022   Other fatigue 01/17/2022   Hypocalcemia 01/17/2022   Vitamin D deficiency 01/17/2022   Recurrent ventral hernia 09/15/2021   Urinary incontinence 06/23/2019   Constipation 06/23/2019   BPPV (benign paroxysmal positional vertigo) 03/20/2019   HTN (hypertension) 04/02/2017   Asthma 04/02/2017   Past Medical History:  Diagnosis Date   Abscess 01/02/2023   Asthma    Complication of anesthesia    Heart murmur    Hernia    Hypertension    New onset type 2 diabetes mellitus (HCC) 09/15/2022   PONV (postoperative nausea and vomiting)    Pre-diabetes    S/P hernia repair 08/26/2021   SBO (small bowel obstruction) (HCC) 09/15/2021    Family History  Problem Relation Age of Onset   Hypertension Mother    Stroke Mother    Dementia Mother    Heart disease Mother    Hyperlipidemia Mother    COPD Father    Rheum arthritis Father    Mental illness Father    Heart disease Maternal Grandmother    Heart disease Maternal Grandfather    Alzheimer's disease Maternal Grandfather    Uterine cancer Paternal Grandmother     Past Surgical History:  Procedure Laterality Date   ABDOMINAL HYSTERECTOMY     one ovary stlil in place. removed uterus cervix and pt thinks fallopian tubes   APPENDECTOMY     APPLICATION OF WOUND VAC N/A 11/04/2022   Procedure: APPLICATION OF WOUND VAC;  Surgeon: [name], MD;  Location: [facility];  Service: General;  Laterality: N/A;   CESAREAN SECTION     DEBRIDEMENT AND CLOSURE WOUND N/A 11/04/2022   Procedure: ABSORBABLE MESH CLOSURE OF ABDOMEN;  Surgeon: [name], MD;  Location: [facility];  Service: General;  Laterality: N/A;   INCISIONAL HERNIA REPAIR N/A 08/26/2021   Procedure: INCISIONAL  HERNIA REPAIR;  Surgeon: [name], MD;  Location: [facility] OR;  Service: General;  Laterality: N/A;   INSERTION OF MESH N/A 08/26/2021   Procedure: INSERTION OF MESH;  Surgeon: [name], MD;  Location: [facility] OR;  Service: General;  Laterality: N/A;   LAPAROTOMY N/A 07/29/2020   Procedure: EXPLORATORY LAPAROTOMY, REPAIR OF VENTRAL HERNIA;  Surgeon: [name], MD;  Location: [facility] OR;  Service: General;  Laterality: N/A;   LAPAROTOMY N/A 10/29/2022   Procedure: EXPLORATORY LAPAROTOMY WITH REPAIR OF SMALL BOWEL PERFORATION, AND PLACEMENT OF WOUND VAC;  Surgeon: Harriette Bouillon, MD;  Location: [facility];  Service: General;  Laterality: N/A;   LAPAROTOMY N/A 10/31/2022   Procedure: EXPLORATORY LAPAROTOMY - WASH OUT OF ABDOMEN AND WOUND VAC PLACEMENT;  Surgeon: Abigail Miyamoto, MD;  Location: [facility];  Service: General;  Laterality:  N/A;   LAPAROTOMY N/A 11/02/2022   Procedure: EXPLORATORY LAPAROTOMY Wound wash out of abdomen and wound vac change;  Surgeon: Abigail Miyamoto, MD;  Location: [facility];  Service: General;  Laterality: N/A;   MYOMECTOMY     XI ROBOTIC ASSISTED VENTRAL HERNIA N/A 10/23/2022   Procedure: ROBOTIC RECURRENT INCISIONAL HERNIA REPAIR WITH MESH WITH BILATERAL TRANVERSUS ABDOMINUS MYOFACIAL RELEASE AND BILATERAL POSTERIOR RECTUS MYOFACIAL RELEASE;  Surgeon: [name], MD;  Location: [facility];  Service: General;  Laterality: N/A;   Social History   Occupational History   Occupation: daycare    Comment: [employer]  Tobacco Use   Smoking status: Never   Smokeless tobacco: Never  Vaping Use   Vaping status: Never Used  Substance and Sexual Activity   Alcohol use: No   Drug use: No   Sexual activity: Not Currently    Partners: Male    Birth control/protection: Surgical

## 2023-02-28 NOTE — Addendum Note (Signed)
Addended by: Barbette Or on: 02/28/2023 08:16 AM   Modules accepted: Orders

## 2023-03-07 DIAGNOSIS — T8131XD Disruption of external operation (surgical) wound, not elsewhere classified, subsequent encounter: Secondary | ICD-10-CM | POA: Diagnosis not present

## 2023-03-07 DIAGNOSIS — S31109A Unspecified open wound of abdominal wall, unspecified quadrant without penetration into peritoneal cavity, initial encounter: Secondary | ICD-10-CM | POA: Diagnosis not present

## 2023-03-15 DIAGNOSIS — Z9889 Other specified postprocedural states: Secondary | ICD-10-CM | POA: Diagnosis not present

## 2023-03-26 ENCOUNTER — Encounter: Payer: Self-pay | Admitting: Orthopedic Surgery

## 2023-04-25 ENCOUNTER — Encounter: Payer: Self-pay | Admitting: Orthopedic Surgery

## 2023-04-26 ENCOUNTER — Ambulatory Visit (INDEPENDENT_AMBULATORY_CARE_PROVIDER_SITE_OTHER): Payer: Self-pay | Admitting: Family

## 2023-04-26 ENCOUNTER — Encounter: Payer: Self-pay | Admitting: Family

## 2023-04-26 ENCOUNTER — Other Ambulatory Visit: Payer: Self-pay | Admitting: Family

## 2023-04-26 VITALS — BP 122/88 | HR 73 | Temp 98.3°F | Ht 65.0 in | Wt 228.0 lb

## 2023-04-26 DIAGNOSIS — Z13220 Encounter for screening for lipoid disorders: Secondary | ICD-10-CM

## 2023-04-26 DIAGNOSIS — I10 Essential (primary) hypertension: Secondary | ICD-10-CM

## 2023-04-26 DIAGNOSIS — R635 Abnormal weight gain: Secondary | ICD-10-CM

## 2023-04-26 DIAGNOSIS — R768 Other specified abnormal immunological findings in serum: Secondary | ICD-10-CM

## 2023-04-26 DIAGNOSIS — Z0001 Encounter for general adult medical examination with abnormal findings: Secondary | ICD-10-CM | POA: Diagnosis not present

## 2023-04-26 DIAGNOSIS — E559 Vitamin D deficiency, unspecified: Secondary | ICD-10-CM

## 2023-04-26 DIAGNOSIS — Z1231 Encounter for screening mammogram for malignant neoplasm of breast: Secondary | ICD-10-CM

## 2023-04-26 DIAGNOSIS — D500 Iron deficiency anemia secondary to blood loss (chronic): Secondary | ICD-10-CM

## 2023-04-26 DIAGNOSIS — Z6837 Body mass index (BMI) 37.0-37.9, adult: Secondary | ICD-10-CM

## 2023-04-26 DIAGNOSIS — Z7984 Long term (current) use of oral hypoglycemic drugs: Secondary | ICD-10-CM

## 2023-04-26 DIAGNOSIS — M25619 Stiffness of unspecified shoulder, not elsewhere classified: Secondary | ICD-10-CM

## 2023-04-26 DIAGNOSIS — J302 Other seasonal allergic rhinitis: Secondary | ICD-10-CM

## 2023-04-26 DIAGNOSIS — S31109A Unspecified open wound of abdominal wall, unspecified quadrant without penetration into peritoneal cavity, initial encounter: Secondary | ICD-10-CM

## 2023-04-26 DIAGNOSIS — E118 Type 2 diabetes mellitus with unspecified complications: Secondary | ICD-10-CM

## 2023-04-26 DIAGNOSIS — Z1211 Encounter for screening for malignant neoplasm of colon: Secondary | ICD-10-CM

## 2023-04-26 LAB — LIPID PANEL
Cholesterol: 195 mg/dL (ref 0–200)
HDL: 46.4 mg/dL (ref >39.00)
LDL Cholesterol: 102 mg/dL — ABNORMAL HIGH (ref 0–99)
NonHDL: 148.33
Total CHOL/HDL Ratio: 4
Triglycerides: 231 mg/dL — ABNORMAL HIGH (ref 0.0–149.0)
VLDL: 46.2 mg/dL — ABNORMAL HIGH (ref 0.0–40.0)

## 2023-04-26 LAB — CBC WITH DIFFERENTIAL/PLATELET
Basophils Absolute: 0 10*3/uL (ref 0.0–0.1)
Basophils Relative: 0.4 % (ref 0.0–3.0)
Eosinophils Absolute: 0.3 10*3/uL (ref 0.0–0.7)
Eosinophils Relative: 3.1 % (ref 0.0–5.0)
HCT: 39.2 % (ref 36.0–46.0)
Hemoglobin: 12.6 g/dL (ref 12.0–15.0)
Lymphocytes Relative: 26.3 % (ref 12.0–46.0)
Lymphs Abs: 2.2 10*3/uL (ref 0.7–4.0)
MCHC: 32.1 g/dL (ref 30.0–36.0)
MCV: 81.2 fL (ref 78.0–100.0)
Monocytes Absolute: 0.5 10*3/uL (ref 0.1–1.0)
Monocytes Relative: 6 % (ref 3.0–12.0)
Neutro Abs: 5.3 10*3/uL (ref 1.4–7.7)
Neutrophils Relative %: 64.2 % (ref 43.0–77.0)
Platelets: 283 10*3/uL (ref 150.0–400.0)
RBC: 4.82 Mil/uL (ref 3.87–5.11)
RDW: 16.3 % — ABNORMAL HIGH (ref 11.5–15.5)
WBC: 8.2 10*3/uL (ref 4.0–10.5)

## 2023-04-26 LAB — TSH: TSH: 1.25 u[IU]/mL (ref 0.35–5.50)

## 2023-04-26 LAB — VITAMIN D 25 HYDROXY (VIT D DEFICIENCY, FRACTURES): VITD: 19.74 ng/mL — ABNORMAL LOW (ref 30.00–100.00)

## 2023-04-26 LAB — T4, FREE: Free T4: 0.83 ng/dL (ref 0.60–1.60)

## 2023-04-26 MED ORDER — METFORMIN HCL ER 500 MG PO TB24
500.0000 mg | ORAL_TABLET | Freq: Two times a day (BID) | ORAL | 3 refills | Status: DC
Start: 2023-04-26 — End: 2023-11-06

## 2023-04-26 MED ORDER — CHOLECALCIFEROL 1.25 MG (50000 UT) PO TABS
1.0000 | ORAL_TABLET | ORAL | 0 refills | Status: DC
Start: 2023-04-26 — End: 2023-12-03

## 2023-04-26 MED ORDER — BENZONATATE 200 MG PO CAPS
200.0000 mg | ORAL_CAPSULE | Freq: Two times a day (BID) | ORAL | 0 refills | Status: AC | PRN
Start: 2023-04-26 — End: ?

## 2023-04-26 NOTE — Assessment & Plan Note (Signed)
Pt advised to work on diet and exercise as tolerated  

## 2023-04-26 NOTE — Assessment & Plan Note (Addendum)
Stable  Continue to monitor.  Cont losartan hydrochlorothiazide 50-12.5 mg daily

## 2023-04-26 NOTE — Assessment & Plan Note (Signed)
Patient Counseling(The following topics were reviewed):  Preventative care handout given to pt  Health maintenance and immunizations reviewed. Please refer to Health maintenance section. Pt advised on safe sex, wearing seatbelts in car, and proper nutrition labwork ordered today for annual Dental health: Discussed importance of regular tooth brushing, flossing, and dental visits.  Cologuard ordered Acute concerns included additional 15 minutes to visit upon review of notes, labs, and also discussion.

## 2023-04-26 NOTE — Assessment & Plan Note (Addendum)
Ordered hga1c today pending results. Work on diabetic diet and exercise as tolerated. Yearly foot exam, and annual eye exam.  Cont metformin 500 mg xr twice daily  Consider GLP 1, pt will consider pending results of thyroid panel Would aid in weight loss.  The beneficiary does not have any FDA labeled contraindications to the requested agent including pregnancy, lactation, h/o medullary thyroid cancer or multiple endocrine neoplasia type II.

## 2023-04-26 NOTE — Assessment & Plan Note (Signed)
Positive hashimotos  Will order tsh and free t4 to r/o active hypothyroid.  If negative can contribute to increased oral intake of food  Consider GLP 1 pending results. Discussed with pt

## 2023-04-26 NOTE — Assessment & Plan Note (Signed)
Pending consult with rheum Still with ongoing polyarthralgia, stable.

## 2023-04-26 NOTE — Progress Notes (Signed)
Subjective:  Patient ID: Kaitlyn Bray, female    DOB: 12/12/1965  Age: 57 y.o. MRN: [id number]  Patient Care Team: Mort Sawyers, FNP as PCP - General (Family Medicine) Ccs, Md, MD (General Surgery)   CC:  Chief Complaint  Patient presents with   Annual Exam    HPI Kaitlyn Bray is a 57 y.o. female who presents today for an annual physical exam. She reports consuming a general diet. The patient does not participate in regular exercise at present. She generally feels well. She reports sleeping well. She does have additional problems to discuss today.   Vision:Within last year Dental:Receives regular dental care  Mammogram: has one scheduled at [facility] next week has not had one in a few years.  Last pap: had hysterectomy, has one ovary in place  Colonoscopy: never had one, but she has an abdominal wound so has been holding off. Open to cologuard, no fmh colon cancer  Bone density scan:  Pt is with acute concerns.  Gaining weight, she has gained 18 pounds since September.  Dm2, on metformin. Average between 120-145 on average. Will drink regular pepsi, sweet tea a few times a week, trying to lean towards no sugar sodas. She does admit to eating a lot during the week a lot more recently and not being able to exercise due to her wound on her abdomen.  Regular bowel movements, does occasionally take miralax will use weekly.   Left shoulder, has an MRI scheduled next week, seeing Dr. Julio Alm orthopedist   IDA: taking daily iron supplementation and trying to increase iron intake with diet.   Wt Readings from Last 3 Encounters:  04/26/23 228 lb (103.4 kg)  02/12/23 220 lb 9.6 oz (100.1 kg)  01/25/23 210 lb 14.4 oz (95.7 kg)    Advanced Directives Patient does not have advanced directives     DEPRESSION SCREENING    04/26/2023    8:37 AM 01/25/2023   12:37 PM 10/10/2022   12:12 PM 01/17/2022   12:02 PM 08/26/2020    8:38 AM 03/20/2019    8:31 AM 04/02/2017    9:49 AM  PHQ 2/9  Scores  PHQ - 2 Score 0 0 0 0 0 0 0  PHQ- 9 Score 0 2  0        ROS: Negative unless specifically indicated above in HPI.    Current Outpatient Medications:    acetaminophen (TYLENOL) 325 MG tablet, Take 1-2 tablets (325-650 mg total) by mouth every 4 (four) hours as needed for mild pain., Disp: , Rfl:    Adhesive Tape (MEDIPORE H SURGICAL 3"X10YD) TAPE, 1 each by Does not apply route daily., Disp: 90 each, Rfl: 0   Ascorbic Acid (VITAMIN C PO), Take 1 capsule by mouth daily., Disp: , Rfl:    benzonatate (TESSALON) 200 MG capsule, Take 1 capsule (200 mg total) by mouth 2 (two) times daily as needed for cough., Disp: 20 capsule, Rfl: 0   Cholecalciferol (VITAMIN D-3 PO), Take 1 capsule by mouth daily., Disp: , Rfl:    Cholecalciferol 1.25 MG (50000 UT) TABS, Take 1 tablet by mouth once a week., Disp: 8 tablet, Rfl: 0   Cyanocobalamin (B-12 PO), Take 1 tablet by mouth daily., Disp: , Rfl:    Disposable Gloves (CAREMATES NITRILE GLOVES XL) MISC, 1 each by Does not apply route daily., Disp: 90 each, Rfl: 0   ferrous sulfate 324 MG TBEC, Take 324 mg by mouth every other day., Disp: , Rfl:  Gauze Pads & Dressings (MEDIPORE + PAD ADHESIVE DRESS) 6"X6" PADS, 1 each by Does not apply route daily., Disp: 90 each, Rfl: 0   loratadine (CLARITIN) 10 MG tablet, Take 1 tablet (10 mg total) by mouth daily., Disp: 100 tablet, Rfl: 0   losartan-hydrochlorothiazide (HYZAAR) 50-12.5 MG tablet, TAKE 1 TABLET BY MOUTH EVERY DAY, Disp: 90 tablet, Rfl: 3   meclizine (ANTIVERT) 25 MG tablet, Take 1 tablet (25 mg total) by mouth 3 (three) times daily as needed for dizziness., Disp: 30 tablet, Rfl: 0   Multiple Vitamins-Minerals (ZINC PO), Take 1 tablet by mouth daily., Disp: , Rfl:    metFORMIN (GLUCOPHAGE-XR) 500 MG 24 hr tablet, Take 1 tablet (500 mg total) by mouth 2 (two) times daily., Disp: 180 tablet, Rfl: 3    Objective:    BP 122/88 (BP Location: Right Arm, Patient Position: Sitting, Cuff Size: Large)    Pulse 73   Temp 98.3 F (36.8 C) (Temporal)   Ht 5\' 5"  (1.651 m)   Wt 228 lb (103.4 kg)   SpO2 95%   BMI 37.94 kg/m   BP Readings from Last 3 Encounters:  04/26/23 122/88  02/12/23 134/88  01/25/23 114/78      Physical Exam Vitals reviewed.  Constitutional:      General: She is not in acute distress.    Appearance: Normal appearance. She is obese. She is not ill-appearing.  HENT:     Head: Normocephalic.     Right Ear: Tympanic membrane normal.     Left Ear: Tympanic membrane normal.     Nose: Nose normal. No congestion or rhinorrhea.     Right Turbinates: Enlarged and swollen.     Mouth/Throat:     Mouth: Mucous membranes are moist.     Pharynx: Postnasal drip present.  Eyes:     Extraocular Movements: Extraocular movements intact.     Pupils: Pupils are equal, round, and reactive to light.  Cardiovascular:     Rate and Rhythm: Normal rate and regular rhythm.  Pulmonary:     Effort: Pulmonary effort is normal.     Breath sounds: Normal breath sounds.  Musculoskeletal:        General: Normal range of motion.     Cervical back: Normal range of motion.  Skin:    General: Skin is warm.     Capillary Refill: Capillary refill takes less than 2 seconds.  Neurological:     General: No focal deficit present.     Mental Status: She is alert.  Psychiatric:        Mood and Affect: Mood normal.        Behavior: Behavior normal.        Thought Content: Thought content normal.        Judgment: Judgment normal.          Assessment & Plan:  Screening mammogram for breast cancer -     3D Screening Mammogram, Left and Right; Future  Abnormal weight gain Assessment & Plan: Positive hashimotos  Will order tsh and free t4 to r/o active hypothyroid.  If negative can contribute to increased oral intake of food  Consider GLP 1 pending results. Discussed with pt  Orders: -     TSH -     T4, free  Iron deficiency anemia due to chronic blood loss -     CBC with  Differential/Platelet -     IBC + Ferritin; Future  Primary hypertension Assessment & Plan: Stable  Continue to monitor.  Cont losartan hydrochlorothiazide 50-12.5 mg daily   Morbid obesity (HCC) Assessment & Plan: Pt advised to work on diet and exercise as tolerated    Screening for lipoid disorders -     Lipid panel  Vitamin D deficiency -     VITAMIN D 25 Hydroxy (Vit-D Deficiency, Fractures)  Screening for colon cancer -     Cologuard  Encounter for general adult medical examination with abnormal findings Assessment & Plan: Patient Counseling(The following topics were reviewed):  Preventative care handout given to pt  Health maintenance and immunizations reviewed. Please refer to Health maintenance section. Pt advised on safe sex, wearing seatbelts in car, and proper nutrition labwork ordered today for annual Dental health: Discussed importance of regular tooth brushing, flossing, and dental visits.  Cologuard ordered Acute concerns included additional 15 minutes to visit upon review of notes, labs, and also discussion.   Seasonal allergies -     Benzonatate; Take 1 capsule (200 mg total) by mouth 2 (two) times daily as needed for cough.  Dispense: 20 capsule; Refill: 0  Controlled type 2 diabetes mellitus with complication, without long-term current use of insulin (HCC) Assessment & Plan: Ordered hga1c today pending results. Work on diabetic diet and exercise as tolerated. Yearly foot exam, and annual eye exam.  Cont metformin 500 mg xr twice daily  Consider GLP 1, pt will consider pending results of thyroid panel Would aid in weight loss.  The beneficiary does not have any FDA labeled contraindications to the requested agent including pregnancy, lactation, h/o medullary thyroid cancer or multiple endocrine neoplasia type II.       Orders: -     metFORMIN HCl ER; Take 1 tablet (500 mg total) by mouth 2 (two) times daily.  Dispense: 180 tablet; Refill:  3  Limited range of motion (ROM) of shoulder Assessment & Plan: Followed by orthopedist, currently in process of physical therapy    Open wound of anterior abdominal wall, initial encounter Assessment & Plan: Pt following with surgeon   Positive ANA (antinuclear antibody) Assessment & Plan: Pending consult with rheum Still with ongoing polyarthralgia, stable.        Follow-up: Return in about 6 months (around 10/25/2023).   Mort Sawyers, FNP

## 2023-04-26 NOTE — Assessment & Plan Note (Signed)
Followed by orthopedist, currently in process of physical therapy

## 2023-04-26 NOTE — Assessment & Plan Note (Signed)
Pt following with surgeon

## 2023-05-02 ENCOUNTER — Ambulatory Visit
Admission: RE | Admit: 2023-05-02 | Discharge: 2023-05-02 | Disposition: A | Discharge status: Home / Self Care | Payer: Managed Care, Other (non HMO) | Source: Ambulatory Visit | Attending: Orthopedic Surgery | Admitting: Orthopedic Surgery

## 2023-05-02 DIAGNOSIS — M25512 Pain in left shoulder: Secondary | ICD-10-CM | POA: Diagnosis not present

## 2023-05-02 DIAGNOSIS — M19012 Primary osteoarthritis, left shoulder: Secondary | ICD-10-CM

## 2023-05-02 MED ORDER — IOPAMIDOL (ISOVUE-M 200) INJECTION 41%
13.0000 mL | Freq: Once | INTRAMUSCULAR | Status: AC
  Administered 2023-05-02: 13 mL

## 2023-05-07 NOTE — Progress Notes (Signed)
Hi [name] can you arrange follow-up for Kaitlyn Bray at some point thanks

## 2023-05-10 ENCOUNTER — Telehealth: Payer: Self-pay | Admitting: Radiology

## 2023-05-10 ENCOUNTER — Encounter: Payer: Self-pay | Admitting: Radiology

## 2023-05-10 NOTE — Telephone Encounter (Signed)
ERROR

## 2023-05-10 NOTE — Progress Notes (Signed)
Attempted to contact patient to schedule. No answer & voicemail was full.

## 2023-05-18 DIAGNOSIS — L7682 Other postprocedural complications of skin and subcutaneous tissue: Secondary | ICD-10-CM | POA: Diagnosis not present

## 2023-05-30 ENCOUNTER — Ambulatory Visit: Payer: 59 | Admitting: Dietician

## 2023-07-18 ENCOUNTER — Telehealth: Payer: Self-pay | Admitting: Family

## 2023-07-18 NOTE — Telephone Encounter (Signed)
 Per our records, pt is due for her mammogram.  Attempted to contact pt, VM was full.

## 2023-07-26 ENCOUNTER — Encounter: Payer: 59 | Admitting: Internal Medicine

## 2023-08-14 ENCOUNTER — Encounter: Payer: Self-pay | Admitting: Infectious Disease

## 2023-08-14 ENCOUNTER — Other Ambulatory Visit: Payer: Self-pay

## 2023-08-14 ENCOUNTER — Ambulatory Visit (INDEPENDENT_AMBULATORY_CARE_PROVIDER_SITE_OTHER): Admitting: Infectious Disease

## 2023-08-14 VITALS — BP 134/83 | HR 78 | Resp 16 | Ht 65.0 in | Wt 250.0 lb

## 2023-08-14 DIAGNOSIS — T83728D Exposure of other implanted mesh and other prosthetic materials to surrounding organ or tissue, subsequent encounter: Secondary | ICD-10-CM

## 2023-08-14 DIAGNOSIS — S31109D Unspecified open wound of abdominal wall, unspecified quadrant without penetration into peritoneal cavity, subsequent encounter: Secondary | ICD-10-CM | POA: Diagnosis not present

## 2023-08-14 DIAGNOSIS — Z8719 Personal history of other diseases of the digestive system: Secondary | ICD-10-CM

## 2023-08-14 DIAGNOSIS — L659 Nonscarring hair loss, unspecified: Secondary | ICD-10-CM | POA: Diagnosis not present

## 2023-08-14 DIAGNOSIS — Z87898 Personal history of other specified conditions: Secondary | ICD-10-CM | POA: Diagnosis not present

## 2023-08-14 DIAGNOSIS — A429 Actinomycosis, unspecified: Secondary | ICD-10-CM | POA: Diagnosis not present

## 2023-08-14 DIAGNOSIS — A499 Bacterial infection, unspecified: Secondary | ICD-10-CM

## 2023-08-14 DIAGNOSIS — B379 Candidiasis, unspecified: Secondary | ICD-10-CM

## 2023-08-14 DIAGNOSIS — E118 Type 2 diabetes mellitus with unspecified complications: Secondary | ICD-10-CM

## 2023-08-14 DIAGNOSIS — K659 Peritonitis, unspecified: Secondary | ICD-10-CM

## 2023-08-14 HISTORY — DX: Personal history of other specified conditions: Z87.898

## 2023-08-14 NOTE — Progress Notes (Signed)
 Chief complaint : Whether her abdominal wound is healing properly and whether it might be residual infection Subjective:    Patient ID: Kaitlyn Bray, female    DOB: [birth year], 58 y.o.   MRN: [id number]  HPI   58 y.o. female who developed post robotic incisional hernia repair, feculent peritonitis obotic incisional hernia repair with mesh ( vicryl mesh patch of posterior layer of repair) , removal of previous intraperitoneal mesh and lysis of adhesions, bilateral transversus abdominis and bilateral posterior rectus myofascial release on 6/10 who had been progressing well until started developing fever and drainage of foul-smelling fluid from her lower abdominal incision site She underwent OR for exploratory ex lap with resection of small bowel perforation and primary anastomosis and excision of mesh with placement of ABThera wound VAC on 6/18   She went back on 11/04/2022 and all prior mesh removed and absorbable mesh placed   Cultures with group G streptococci, actinomyecs, bacteroides ovatus beta lactamase positive and candida albicans    Some Zosyn to Unasyn of continuous fluconazole.   We obtained a CT scan of the abdomen pelvis to make sure she did not have any other known abscesses.   CT scan shows a loculated fluid collection in the anterior lower abdomen that radiology felt could be due to developing abscess.   It apparently may communicate with the anterior pelvic wall open wound.   There is other small loculated collections in the lower abdomen with 2 drainage catheters terminating in the left hemipelvis.   Dr.Stechschulte has reviewed and feels that collection seen on CT are fairly expected findings and not likely to be infected.  He did not want to aspirate or placed drains in them as they lie underneath the mesh in the mesentery.  I had wanted to have a month of fluconazole and Augmentin with a CT scan in the interim showing resolution of the fluid collection but she went  ahead and finish the Augmentin and fluconazole but did not get CT scan as she was concerned about the cost--he last time she had an outpatient CT scan she had to pay $700 upfront.    Interim history:  She has been following closely with surgery and not had any evidence of recurrence of her infection after finishing Augmentin and fluconazole she had not had fevers chills or worsening abdominal pain she has suffered some diarrhea that she partly attributed Augmentin and now metformin.  I had felt comfortable watching her off of the antibiotics though with the actinomyces present on culture she really should have been on at least 6 months of effective beta lactam for this.  Discussed the use of AI scribe software for clinical note transcription with the patient, who gave verbal consent to proceed.  History of Present Illness   Kaitlyn Bray, a patient with a history of a complicated incisional hernia repair, presents with concerns about the hernia mesh that was used in the repair. The mesh is still present and occasionally bleeds, causing discomfort. The patient reports that the mesh was supposed to be absorbed by the body, but it remains exposed. The patient has been off antibiotics for about seven months following treatment as above. The patient has not experienced any fever or signs of infection since stopping the antibiotics. The patient is currently working part-time due to the ongoing issues with the hernia repair.        Past Medical History:  Diagnosis Date   Abscess 01/02/2023   Asthma    Complication of  anesthesia    H/O abdominal abscess 08/14/2023   Heart murmur    Hernia    Hypertension    New onset type 2 diabetes mellitus (HCC) 09/15/2022   PONV (postoperative nausea and vomiting)    Pre-diabetes    S/P hernia repair 08/26/2021   SBO (small bowel obstruction) (HCC) 09/15/2021    Past Surgical History:  Procedure Laterality Date   ABDOMINAL HYSTERECTOMY     one ovary stlil in  place. removed uterus cervix and pt thinks fallopian tubes   APPENDECTOMY     APPLICATION OF WOUND VAC N/A 11/04/2022   Procedure: APPLICATION OF WOUND VAC;  Surgeon: [name], MD;  Location: [facility];  Service: General;  Laterality: N/A;   CESAREAN SECTION     DEBRIDEMENT AND CLOSURE WOUND N/A 11/04/2022   Procedure: ABSORBABLE MESH CLOSURE OF ABDOMEN;  Surgeon: [name], MD;  Location: [facility];  Service: General;  Laterality: N/A;   INCISIONAL HERNIA REPAIR N/A 08/26/2021   Procedure: INCISIONAL HERNIA REPAIR;  Surgeon: [name], MD;  Location: [facility] OR;  Service: General;  Laterality: N/A;   INSERTION OF MESH N/A 08/26/2021   Procedure: INSERTION OF MESH;  Surgeon: [name], MD;  Location: [facility] OR;  Service: General;  Laterality: N/A;   LAPAROTOMY N/A 07/29/2020   Procedure: EXPLORATORY LAPAROTOMY, REPAIR OF VENTRAL HERNIA;  Surgeon: [name], MD;  Location: [facility] OR;  Service: General;  Laterality: N/A;   LAPAROTOMY N/A 10/29/2022   Procedure: EXPLORATORY LAPAROTOMY WITH REPAIR OF SMALL BOWEL PERFORATION, AND PLACEMENT OF WOUND VAC;  Surgeon: Harriette Bouillon, MD;  Location: [facility];  Service: General;  Laterality: N/A;   LAPAROTOMY N/A 10/31/2022   Procedure: EXPLORATORY LAPAROTOMY - WASH OUT OF ABDOMEN AND WOUND VAC PLACEMENT;  Surgeon: Abigail Miyamoto, MD;  Location: [facility];  Service: General;  Laterality: N/A;   LAPAROTOMY N/A 11/02/2022   Procedure: EXPLORATORY LAPAROTOMY Wound wash out of abdomen and wound vac change;  Surgeon: Abigail Miyamoto, MD;  Location: [facility];  Service: General;  Laterality: N/A;   MYOMECTOMY     XI ROBOTIC ASSISTED VENTRAL HERNIA N/A 10/23/2022   Procedure: ROBOTIC RECURRENT INCISIONAL HERNIA REPAIR WITH MESH WITH BILATERAL TRANVERSUS ABDOMINUS MYOFACIAL RELEASE AND BILATERAL POSTERIOR RECTUS MYOFACIAL RELEASE;  Surgeon: [name], MD;  Location: [facility];  Service: General;  Laterality: N/A;    Family History  Problem  Relation Age of Onset   Hypertension Mother    Stroke Mother    Dementia Mother    Heart disease Mother    Hyperlipidemia Mother    COPD Father    Rheum arthritis Father    Mental illness Father    Heart disease Maternal Grandmother    Heart disease Maternal Grandfather    Alzheimer's disease Maternal Grandfather    Uterine cancer Paternal Grandmother       Social History   Socioeconomic History   Marital status: Married    Spouse name: Not on file   Number of children: 1   Years of education: Not on file   Highest education level: Not on file  Occupational History   Occupation: daycare    Comment: [employer]  Tobacco Use   Smoking status: Never   Smokeless tobacco: Never  Vaping Use   Vaping status: Never Used  Substance and Sexual Activity   Alcohol use: No   Drug use: No   Sexual activity: Not Currently    Partners: Male    Birth control/protection: Surgical  Other Topics Concern   Not on file  Social History Narrative   One 58 y/o boy    Lives with husband and son    Cats outside   Social Drivers of Health   Financial Resource Strain: Not on file  Food Insecurity: No Food Insecurity (10/23/2022)   Hunger Vital Sign    Worried About Running Out of Food in the Last Year: Never true    Ran Out of Food in the Last Year: Never true  Transportation Needs: No Transportation Needs (10/23/2022)   PRAPARE - Administrator, Civil Service (Medical): No    Lack of Transportation (Non-Medical): No  Physical Activity: Not on file  Stress: Not on file  Social Connections: Not on file    Allergies  Allergen Reactions   Latex Other (See Comments)    Redness from bandaids   Codeine Palpitations    Tachycardia    Sulfa Drugs Cross Reactors Rash     Current Outpatient Medications:    acetaminophen (TYLENOL) 325 MG tablet, Take 1-2 tablets (325-650 mg total) by mouth every 4 (four) hours as needed for mild pain., Disp: , Rfl:    Adhesive Tape  (MEDIPORE H SURGICAL 3"X10YD) TAPE, 1 each by Does not apply route daily., Disp: 90 each, Rfl: 0   Ascorbic Acid (VITAMIN C PO), Take 1 capsule by mouth daily., Disp: , Rfl:    benzonatate (TESSALON) 200 MG capsule, Take 1 capsule (200 mg total) by mouth 2 (two) times daily as needed for cough., Disp: 20 capsule, Rfl: 0   Cholecalciferol (VITAMIN D-3 PO), Take 1 capsule by mouth daily., Disp: , Rfl:    Cholecalciferol 1.25 MG (50000 UT) TABS, Take 1 tablet by mouth once a week., Disp: 12 tablet, Rfl: 0   Cyanocobalamin (B-12 PO), Take 1 tablet by mouth daily., Disp: , Rfl:    Disposable Gloves (CAREMATES NITRILE GLOVES XL) MISC, 1 each by Does not apply route daily., Disp: 90 each, Rfl: 0   ferrous sulfate 324 MG TBEC, Take 324 mg by mouth every other day., Disp: , Rfl:    Gauze Pads & Dressings (MEDIPORE + PAD ADHESIVE DRESS) 6"X6" PADS, 1 each by Does not apply route daily., Disp: 90 each, Rfl: 0   loratadine (CLARITIN) 10 MG tablet, Take 1 tablet (10 mg total) by mouth daily., Disp: 100 tablet, Rfl: 0   losartan-hydrochlorothiazide (HYZAAR) 50-12.5 MG tablet, TAKE 1 TABLET BY MOUTH EVERY DAY, Disp: 90 tablet, Rfl: 3   meclizine (ANTIVERT) 25 MG tablet, Take 1 tablet (25 mg total) by mouth 3 (three) times daily as needed for dizziness., Disp: 30 tablet, Rfl: 0   metFORMIN (GLUCOPHAGE-XR) 500 MG 24 hr tablet, Take 1 tablet (500 mg total) by mouth 2 (two) times daily., Disp: 180 tablet, Rfl: 3   Multiple Vitamins-Minerals (ZINC PO), Take 1 tablet by mouth daily., Disp: , Rfl:    Review of Systems  Constitutional:  Negative for activity change, appetite change, chills, diaphoresis, fatigue, fever and unexpected weight change.  HENT:  Negative for congestion, rhinorrhea, sinus pressure, sneezing, sore throat and trouble swallowing.   Eyes:  Negative for photophobia and visual disturbance.  Respiratory:  Negative for cough, chest tightness, shortness of breath, wheezing and stridor.    Cardiovascular:  Negative for chest pain, palpitations and leg swelling.  Gastrointestinal:  Positive for abdominal distention. Negative for abdominal pain, anal bleeding, blood in stool, constipation, diarrhea, nausea and vomiting.  Genitourinary:  Negative for difficulty urinating,  dysuria, flank pain and hematuria.  Musculoskeletal:  Negative for arthralgias, back pain, gait problem, joint swelling and myalgias.  Skin:  Negative for color change, pallor, rash and wound.  Neurological:  Negative for dizziness, tremors, weakness and light-headedness.  Hematological:  Negative for adenopathy. Does not bruise/bleed easily.  Psychiatric/Behavioral:  Negative for agitation, behavioral problems, confusion, decreased concentration, dysphoric mood and sleep disturbance.        Objective:   Physical Exam Constitutional:      General: She is not in acute distress.    Appearance: Normal appearance. She is well-developed. She is not ill-appearing or diaphoretic.  HENT:     Head: Normocephalic and atraumatic.     Right Ear: Hearing and external ear normal.     Left Ear: Hearing and external ear normal.     Nose: No nasal deformity or rhinorrhea.  Eyes:     General: No scleral icterus.    Conjunctiva/sclera: Conjunctivae normal.     Right eye: Right conjunctiva is not injected.     Left eye: Left conjunctiva is not injected.     Pupils: Pupils are equal, round, and reactive to light.  Neck:     Vascular: No JVD.  Cardiovascular:     Rate and Rhythm: Normal rate and regular rhythm.     Heart sounds: S1 normal and S2 normal.  Pulmonary:     Effort: Pulmonary effort is normal. No respiratory distress.     Breath sounds: No wheezing.  Abdominal:     General: Bowel sounds are normal. There is no distension.     Palpations: Abdomen is soft. There is no mass.     Tenderness: There is no abdominal tenderness. There is no guarding.  Musculoskeletal:        General: Normal range of motion.      Right shoulder: Normal.     Left shoulder: Normal.     Cervical back: Normal range of motion and neck supple.     Right hip: Normal.     Left hip: Normal.     Right knee: Normal.     Left knee: Normal.  Lymphadenopathy:     Head:     Right side of head: No submandibular, preauricular or posterior auricular adenopathy.     Left side of head: No submandibular, preauricular or posterior auricular adenopathy.     Cervical: No cervical adenopathy.     Right cervical: No superficial or deep cervical adenopathy.    Left cervical: No superficial or deep cervical adenopathy.  Skin:    General: Skin is warm and dry.     Coloration: Skin is not pale.     Findings: No abrasion, bruising, ecchymosis, erythema, lesion or rash.     Nails: There is no clubbing.  Neurological:     Mental Status: She is alert and oriented to person, place, and time.     Sensory: No sensory deficit.     Coordination: Coordination normal.     Gait: Gait normal.  Psychiatric:        Attention and Perception: She is attentive.        Speech: Speech normal.        Behavior: Behavior normal. Behavior is cooperative.        Thought Content: Thought content normal.        Judgment: Judgment normal.   Surgical site 08/14/2023:         Assessment & Plan:   Assessment and Plan  Exposed surgical mesh with drainage Exposed surgical mesh in the abdominal area following incisional hernia repair complicated by sepsis and infection. The mesh is causing soreness and occasional bleeding. No fever or significant abdominal pain reported. Concern for potential infection recurrence in the abdomen especially given mesh and risk it might be infected,, and also some of the organisms such as actinomyces in particular being notorious for causing further problems.    - Order CT scan with oral and IV contrast to assess for abscesses or complications related to the mesh - Perform blood work to check kidney function and CBC --if she  has new abscesses will need to coordinate with [facility] and or IR to get an aspirate of culture of purulent material and certainly if mesh appears infected she would undoubtedly need mesh removal --for now hold on resumption of antibiotics though we would start them again after getting new culture data and make sure that we target the actinomyces for longer beta lactam therapy.   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 Concern for insufficient treatment duration and risk for abscesses possibly having recurred   Hernia with mesh: thought not to be involved in the prior intrabdominal infection (mesh that is present)   Open wound with mesh exposed is gradually smaller  Anemia Anemia managed with iron supplements. Blood levels need monitoring to ensure adequate management. 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 No current concern for yeast infection recurrence. - Monitor  as above cultures if we end up getting intra-operative or IR guided cultures. If we need to use fluconazole and she loses hair again it is a reversible toxicity  Hair loss with azole; regrew off of fluconazole

## 2023-08-15 LAB — CBC WITH DIFFERENTIAL/PLATELET
Absolute Lymphocytes: 2680 {cells}/uL (ref 850–3900)
Absolute Monocytes: 592 {cells}/uL (ref 200–950)
Basophils Absolute: 81 {cells}/uL (ref 0–200)
Basophils Relative: 0.7 %
Eosinophils Absolute: 313 {cells}/uL (ref 15–500)
Eosinophils Relative: 2.7 %
HCT: 38.1 % (ref 35.0–45.0)
Hemoglobin: 12.3 g/dL (ref 11.7–15.5)
MCH: 26.2 pg — ABNORMAL LOW (ref 27.0–33.0)
MCHC: 32.3 g/dL (ref 32.0–36.0)
MCV: 81.1 fL (ref 80.0–100.0)
MPV: 12.3 fL (ref 7.5–12.5)
Monocytes Relative: 5.1 %
Neutro Abs: 7934 {cells}/uL — ABNORMAL HIGH (ref 1500–7800)
Neutrophils Relative %: 68.4 %
Platelets: 275 10*3/uL (ref 140–400)
RBC: 4.7 10*6/uL (ref 3.80–5.10)
RDW: 14.4 % (ref 11.0–15.0)
Total Lymphocyte: 23.1 %
WBC: 11.6 10*3/uL — ABNORMAL HIGH (ref 3.8–10.8)

## 2023-08-15 LAB — COMPLETE METABOLIC PANEL WITHOUT GFR
AG Ratio: 1.3 (calc) (ref 1.0–2.5)
ALT: 23 U/L (ref 6–29)
AST: 18 U/L (ref 10–35)
Albumin: 4.2 g/dL (ref 3.6–5.1)
Alkaline phosphatase (APISO): 88 U/L (ref 37–153)
BUN: 15 mg/dL (ref 7–25)
CO2: 30 mmol/L (ref 20–32)
Calcium: 9.9 mg/dL (ref 8.6–10.4)
Chloride: 100 mmol/L (ref 98–110)
Creat: 0.61 mg/dL (ref 0.50–1.03)
Globulin: 3.3 g/dL (ref 1.9–3.7)
Glucose, Bld: 103 mg/dL — ABNORMAL HIGH (ref 65–99)
Potassium: 4.2 mmol/L (ref 3.5–5.3)
Sodium: 141 mmol/L (ref 135–146)
Total Bilirubin: 0.3 mg/dL (ref 0.2–1.2)
Total Protein: 7.5 g/dL (ref 6.1–8.1)

## 2023-08-16 ENCOUNTER — Telehealth: Payer: Self-pay

## 2023-08-16 NOTE — Telephone Encounter (Signed)
 Patient called stating that she received lab results on my chart and wanted to know if she needed to back on antibiotics. Patient is also scheduled for CT scan on 4/18.   Neosha Switalski Lesli Albee, CMA

## 2023-08-21 ENCOUNTER — Ambulatory Visit (HOSPITAL_COMMUNITY)

## 2023-08-31 ENCOUNTER — Ambulatory Visit (HOSPITAL_COMMUNITY)
Admission: RE | Admit: 2023-08-31 | Discharge: 2023-08-31 | Disposition: A | Discharge status: Home / Self Care | Source: Ambulatory Visit | Attending: Infectious Disease | Admitting: Infectious Disease

## 2023-08-31 DIAGNOSIS — E118 Type 2 diabetes mellitus with unspecified complications: Secondary | ICD-10-CM | POA: Insufficient documentation

## 2023-08-31 DIAGNOSIS — Z87898 Personal history of other specified conditions: Secondary | ICD-10-CM | POA: Diagnosis not present

## 2023-08-31 DIAGNOSIS — K651 Peritoneal abscess: Secondary | ICD-10-CM | POA: Diagnosis not present

## 2023-08-31 DIAGNOSIS — R160 Hepatomegaly, not elsewhere classified: Secondary | ICD-10-CM | POA: Diagnosis not present

## 2023-08-31 DIAGNOSIS — K5730 Diverticulosis of large intestine without perforation or abscess without bleeding: Secondary | ICD-10-CM | POA: Diagnosis not present

## 2023-08-31 DIAGNOSIS — K439 Ventral hernia without obstruction or gangrene: Secondary | ICD-10-CM | POA: Diagnosis not present

## 2023-08-31 MED ORDER — IOHEXOL 9 MG/ML PO SOLN
500.0000 mL | ORAL | Status: AC
  Administered 2023-08-31: 500 mL via ORAL

## 2023-08-31 MED ORDER — IOHEXOL 350 MG/ML SOLN
75.0000 mL | Freq: Once | INTRAVENOUS | Status: AC | PRN
  Administered 2023-08-31: 75 mL via INTRAVENOUS

## 2023-09-04 ENCOUNTER — Telehealth: Payer: Self-pay

## 2023-09-04 NOTE — Telephone Encounter (Signed)
 Patient called for CT results. Discussed that scans have not been read by radiologist yet and that our office will reach out to her once report is finalized. Patient verbalized understanding and has no further questions.   Arryanna Holquin, BSN, RN

## 2023-09-05 ENCOUNTER — Telehealth: Payer: Self-pay

## 2023-09-05 NOTE — Telephone Encounter (Signed)
 Attempted to call patient regarding results. Not able to reach her at this time. Will send mychart message. Julien Odor, RMA

## 2023-09-05 NOTE — Telephone Encounter (Signed)
-----   Message from [place] sent at 09/05/2023  4:34 PM EDT ----- Regarding: CT scan does NOT show any infection on imaging whatsoever which is reassuring  ----- Message ----- From: Dannis Dy, Rad Results In Sent: 09/05/2023  11:12 AM EDT To: Charolette Copier, MD

## 2023-09-17 ENCOUNTER — Other Ambulatory Visit: Payer: Self-pay | Admitting: Ophthalmology

## 2023-09-21 LAB — DERMATOLOGY PATHOLOGY

## 2023-10-01 ENCOUNTER — Ambulatory Visit: Payer: Self-pay | Admitting: Infectious Disease

## 2023-10-10 ENCOUNTER — Telehealth: Payer: Self-pay

## 2023-10-10 ENCOUNTER — Other Ambulatory Visit: Payer: Self-pay | Admitting: Family

## 2023-10-10 NOTE — Telephone Encounter (Signed)
 Copied from CRM [id number]. Topic: Clinical - Medical Advice >> Oct 10, 2023  2:39 PM Danna Duster wrote: Reason for CRM: Patient calling in response to missed call.  Patient was not sure if the call was regarding an office visit is needed before the losartan -hydrochlorothiazide  (HYZAAR) 50-12.5 MG tablet could be refilled.  I did not see any unresolved CRM or phone message notes in the chart review.  I tried calling the CAL for [facility] at [facility] and both attempts I received a recording that no one is available at this time.  Please call the patient again.

## 2023-10-10 NOTE — Telephone Encounter (Signed)
 Called pt wasn't able to lvm will call back later

## 2023-10-11 NOTE — Telephone Encounter (Signed)
 Attempted to contact pt, VM was full.  Her prescription was sent in on 10/10/23. She is in need an appointment with Tabitha around 10/25/23.

## 2023-10-12 NOTE — Telephone Encounter (Signed)
 Called pt. No answer and Unable to leave voicemail.  Mailbox full

## 2023-10-15 NOTE — Telephone Encounter (Signed)
 Unable to leave vm -3rd attempt

## 2023-11-06 ENCOUNTER — Other Ambulatory Visit: Payer: Self-pay | Admitting: Family

## 2023-11-06 DIAGNOSIS — E118 Type 2 diabetes mellitus with unspecified complications: Secondary | ICD-10-CM

## 2023-12-03 ENCOUNTER — Encounter: Payer: Self-pay | Admitting: Family

## 2023-12-03 ENCOUNTER — Ambulatory Visit: Admitting: Family

## 2023-12-03 ENCOUNTER — Ambulatory Visit (INDEPENDENT_AMBULATORY_CARE_PROVIDER_SITE_OTHER): Payer: Self-pay | Admitting: Family

## 2023-12-03 ENCOUNTER — Encounter: Payer: Self-pay | Admitting: *Deleted

## 2023-12-03 ENCOUNTER — Other Ambulatory Visit: Payer: Self-pay | Admitting: Family

## 2023-12-03 VITALS — BP 130/82 | HR 83 | Temp 97.3°F | Ht 65.0 in | Wt 251.0 lb

## 2023-12-03 DIAGNOSIS — D508 Other iron deficiency anemias: Secondary | ICD-10-CM

## 2023-12-03 DIAGNOSIS — E785 Hyperlipidemia, unspecified: Secondary | ICD-10-CM

## 2023-12-03 DIAGNOSIS — E559 Vitamin D deficiency, unspecified: Secondary | ICD-10-CM

## 2023-12-03 DIAGNOSIS — E1169 Type 2 diabetes mellitus with other specified complication: Secondary | ICD-10-CM

## 2023-12-03 DIAGNOSIS — R1084 Generalized abdominal pain: Secondary | ICD-10-CM

## 2023-12-03 DIAGNOSIS — R739 Hyperglycemia, unspecified: Secondary | ICD-10-CM | POA: Insufficient documentation

## 2023-12-03 DIAGNOSIS — R635 Abnormal weight gain: Secondary | ICD-10-CM

## 2023-12-03 DIAGNOSIS — E118 Type 2 diabetes mellitus with unspecified complications: Secondary | ICD-10-CM

## 2023-12-03 DIAGNOSIS — R232 Flushing: Secondary | ICD-10-CM

## 2023-12-03 DIAGNOSIS — D72829 Elevated white blood cell count, unspecified: Secondary | ICD-10-CM

## 2023-12-03 DIAGNOSIS — S3991XA Unspecified injury of abdomen, initial encounter: Secondary | ICD-10-CM

## 2023-12-03 DIAGNOSIS — Z7985 Long-term (current) use of injectable non-insulin antidiabetic drugs: Secondary | ICD-10-CM

## 2023-12-03 DIAGNOSIS — R110 Nausea: Secondary | ICD-10-CM

## 2023-12-03 DIAGNOSIS — I10 Essential (primary) hypertension: Secondary | ICD-10-CM

## 2023-12-03 DIAGNOSIS — S31109A Unspecified open wound of abdominal wall, unspecified quadrant without penetration into peritoneal cavity, initial encounter: Secondary | ICD-10-CM

## 2023-12-03 LAB — MICROALBUMIN / CREATININE URINE RATIO
Creatinine,U: 110.1 mg/dL
Microalb Creat Ratio: UNDETERMINED mg/g (ref 0.0–30.0)
Microalb, Ur: <0.7 mg/dL

## 2023-12-03 LAB — PROLACTIN: Prolactin: 4.1 ng/mL

## 2023-12-03 MED ORDER — ONDANSETRON HCL 4 MG PO TABS: 4.0000 mg | ORAL_TABLET | Freq: Three times a day (TID) | ORAL | 0 refills | Status: AC | PRN

## 2023-12-03 MED ORDER — OZEMPIC (0.25 OR 0.5 MG/DOSE) 2 MG/1.5ML ~~LOC~~ SOPN: PEN_INJECTOR | SUBCUTANEOUS | 0 refills | Status: AC

## 2023-12-03 NOTE — Patient Instructions (Addendum)
  Your imaging for ultrasound abdomen  Has been scheduled at the following location:  [facility] [address] [place], [place] [address] Phone: [phone],  8-5 pm

## 2023-12-03 NOTE — Progress Notes (Signed)
 Established Patient Office Visit  Subjective:   Patient ID: Kaitlyn Bray, female    DOB: [birth year]  Age: 58 y.o. MRN: [id number]  CC:  Chief Complaint  Patient presents with   Medical Management of Chronic Issues    HPI: Kaitlyn Bray is a 58 y.o. female presenting on 12/03/2023 for Medical Management of Chronic Issues   Weight gain, she has gained 30 pounds since December 2024. She states has been eating a bit more, not walking as often as she was before. 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 She is still handling healing on her abdominal wound, last visit with surgeon 10/25/23. Surgeon has advised her to needs to lose weight as well. She is interested in GLP 1 she does not have personal or family history of thyroid  cancer tha tshe knows of and or neuroendocrine dysplasia.   DM2: she is on metformin  two tablets twice daily but sometimes will forget night time dose not that often.   Wt Readings from Last 3 Encounters:  12/03/23 251 lb (113.9 kg)  08/14/23 250 lb (113.4 kg)  04/26/23 228 lb (103.4 kg)   Back in may she fell onto her left abdominal area and states she still has pain on her left abdominal side. No changes in bowel movements. No change in bowel movements, no blood in the stool. No nausea or vomiting.      ROS: Negative unless specifically indicated above in HPI.   Relevant past medical history reviewed and updated as indicated.   Allergies and medications reviewed and updated.   Current Outpatient Medications:    acetaminophen  (TYLENOL ) 325 MG tablet, Take 1-2 tablets (325-650 mg total) by mouth every 4 (four) hours as needed for mild pain., Disp: , Rfl:    Ascorbic Acid  (VITAMIN C  PO), Take 1 capsule by mouth daily., Disp: , Rfl:    benzonatate  (TESSALON ) 200 MG capsule, Take 1 capsule (200 mg total) by mouth 2 (two) times daily as needed for cough., Disp: 20 capsule, Rfl: 0   Cyanocobalamin  (B-12 PO), Take 1 tablet by mouth  daily., Disp: , Rfl:    meclizine  (ANTIVERT ) 25 MG tablet, Take 1 tablet (25 mg total) by mouth 3 (three) times daily as needed for dizziness., Disp: 30 tablet, Rfl: 0   metFORMIN  (GLUCOPHAGE -XR) 500 MG 24 hr tablet, TAKE 1 TABLET (500 MG TOTAL) BY MOUTH 2 (TWO) TIMES DAILY. TAKE TWO TABLETS TWICE DAILY, Disp: 120 tablet, Rfl: 11   ondansetron  (ZOFRAN ) 4 MG tablet, Take 1 tablet (4 mg total) by mouth every 8 (eight) hours as needed for nausea or vomiting., Disp: 20 tablet, Rfl: 0   Semaglutide ,0.25 or 0.5MG /DOS, (OZEMPIC , 0.25 OR 0.5 MG/DOSE,) 2 MG/1.5ML SOPN, Inject 0.25 mg into the skin once a week for 30 days, THEN 0.5 mg once a week., Disp: 2.25 mL, Rfl: 0   ferrous sulfate 324 MG TBEC, Take 324 mg by mouth every other day. (Patient not taking: Reported on 12/03/2023), Disp: , Rfl:    losartan -hydrochlorothiazide  (HYZAAR) 50-12.5 MG tablet, Take 1 tablet by mouth daily., Disp: 90 tablet, Rfl: 1  Allergies  Allergen Reactions   Latex Other (See Comments)    Redness from bandaids   Codeine Palpitations    Tachycardia    Sulfa Drugs Cross Reactors Rash    Objective:   BP 130/82   Pulse 83   Temp (!) 97.3 F (36.3 C) (Oral)   Ht 5' 5 (1.651 m)  Wt 251 lb (113.9 kg)   SpO2 93%   BMI 41.77 kg/m    Physical Exam Vitals reviewed.  Constitutional:      General: She is not in acute distress.    Appearance: Normal appearance. She is normal weight. She is not ill-appearing, toxic-appearing or diaphoretic.  HENT:     Head: Normocephalic.  Cardiovascular:     Rate and Rhythm: Normal rate and regular rhythm.  Pulmonary:     Effort: Pulmonary effort is normal.     Breath sounds: Normal breath sounds.  Abdominal:     Palpations: Abdomen is soft.     Tenderness: There is abdominal tenderness in the periumbilical area and left upper quadrant.  Musculoskeletal:        General: Normal range of motion.  Neurological:     General: No focal deficit present.     Mental Status: She is  alert and oriented to person, place, and time. Mental status is at baseline.  Psychiatric:        Mood and Affect: Mood normal.        Behavior: Behavior normal.        Thought Content: Thought content normal.        Judgment: Judgment normal.     Assessment & Plan:  Leukocytosis, unspecified type -     CBC with Differential/Platelet  Hot flashes -     Follicle stimulating hormone -     Prolactin  Abnormal weight gain Assessment & Plan: Ordering tsh  Pending r/o thyroid  disease Consider starting exercise routine and focusing on diet   Orders: -     TSH -     T3, free -     T4, free  Primary hypertension  Vitamin D  deficiency -     VITAMIN D  25 Hydroxy (Vit-D Deficiency, Fractures)  Hypocalcemia  Controlled type 2 diabetes mellitus with complication, without long-term current use of insulin  (HCC) Assessment & Plan: Urine m/a ordered pending results.  Referred to nutritionist Start ozepmic 0.25 mg weekly pending insurance.    Orders: -     Hemoglobin A1c -     Referral to Nutrition and Diabetes Services -     Ondansetron  HCl; Take 1 tablet (4 mg total) by mouth every 8 (eight) hours as needed for nausea or vomiting.  Dispense: 20 tablet; Refill: 0 -     Ozempic  (0.25 or 0.5 MG/DOSE); Inject 0.25 mg into the skin once a week for 30 days, THEN 0.5 mg once a week.  Dispense: 2.25 mL; Refill: 0 -     Microalbumin / creatinine urine ratio  Hyperlipidemia associated with type 2 diabetes mellitus (HCC) Assessment & Plan: Ordered lipid panel, pending results. Work on low cholesterol diet and exercise as tolerated Referred to nutritionist for diabetic and cholesterol tips   Orders: -     Lipid panel -     Referral to Nutrition and Diabetes Services -     Ozempic  (0.25 or 0.5 MG/DOSE); Inject 0.25 mg into the skin once a week for 30 days, THEN 0.5 mg once a week.  Dispense: 2.25 mL; Refill: 0  Iron deficiency anemia secondary to inadequate dietary iron intake -     IBC  + Ferritin  Morbid obesity (HCC) Assessment & Plan: Pt advised to work on diet and exercise as tolerated   Orders: -     Referral to Nutrition and Diabetes Services  Nausea -     Ondansetron  HCl; Take 1 tablet (4 mg  total) by mouth every 8 (eight) hours as needed for nausea or vomiting.  Dispense: 20 tablet; Refill: 0  Blunt trauma to abdomen, initial encounter Assessment & Plan: Pt is stable  Will order u/s to rule out abscess  Cbc ordered pending results.    Orders: -     US  Abdomen Complete; Future  Generalized abdominal pain  Open wound of anterior abdominal wall, initial encounter Assessment & Plan: Cont with f/u surgeon       Follow up plan: Return in about 3 years (around 12/03/2026) for f/u diabetes.  Ginger Patrick, FNP

## 2023-12-04 ENCOUNTER — Telehealth: Payer: Self-pay

## 2023-12-04 ENCOUNTER — Other Ambulatory Visit (HOSPITAL_COMMUNITY): Payer: Self-pay

## 2023-12-04 LAB — LIPID PANEL
Cholesterol: 214 mg/dL — ABNORMAL HIGH (ref 0–200)
HDL: 43.5 mg/dL (ref >39.00)
LDL Cholesterol: 125 mg/dL — ABNORMAL HIGH (ref 0–99)
NonHDL: 170.03
Total CHOL/HDL Ratio: 5
Triglycerides: 223 mg/dL — ABNORMAL HIGH (ref 0.0–149.0)
VLDL: 44.6 mg/dL — ABNORMAL HIGH (ref 0.0–40.0)

## 2023-12-04 LAB — IBC + FERRITIN
Ferritin: 38 ng/mL (ref 10.0–291.0)
Iron: 46 ug/dL (ref 42–145)
Saturation Ratios: 12.5 % — ABNORMAL LOW (ref 20.0–50.0)
TIBC: 368.2 ug/dL (ref 250.0–450.0)
Transferrin: 263 mg/dL (ref 212.0–360.0)

## 2023-12-04 LAB — CBC WITH DIFFERENTIAL/PLATELET
Basophils Absolute: 0.1 K/uL (ref 0.0–0.1)
Basophils Relative: 0.9 % (ref 0.0–3.0)
Eosinophils Absolute: 0.2 K/uL (ref 0.0–0.7)
Eosinophils Relative: 2 % (ref 0.0–5.0)
HCT: 39.5 % (ref 36.0–46.0)
Hemoglobin: 12.8 g/dL (ref 12.0–15.0)
Lymphocytes Relative: 18.2 % (ref 12.0–46.0)
Lymphs Abs: 1.8 K/uL (ref 0.7–4.0)
MCHC: 32.3 g/dL (ref 30.0–36.0)
MCV: 84.8 fl (ref 78.0–100.0)
Monocytes Absolute: 0.6 K/uL (ref 0.1–1.0)
Monocytes Relative: 5.7 % (ref 3.0–12.0)
Neutro Abs: 7.4 K/uL (ref 1.4–7.7)
Neutrophils Relative %: 73.2 % (ref 43.0–77.0)
Platelets: 220 K/uL (ref 150.0–400.0)
RBC: 4.66 Mil/uL (ref 3.87–5.11)
RDW: 15.3 % (ref 11.5–15.5)
WBC: 10 K/uL (ref 4.0–10.5)

## 2023-12-04 LAB — TSH: TSH: 2.38 u[IU]/mL (ref 0.35–5.50)

## 2023-12-04 LAB — VITAMIN D 25 HYDROXY (VIT D DEFICIENCY, FRACTURES): VITD: 12.64 ng/mL — ABNORMAL LOW (ref 30.00–100.00)

## 2023-12-04 LAB — HEMOGLOBIN A1C: Hgb A1c MFr Bld: 8.3 % — ABNORMAL HIGH (ref 4.6–6.5)

## 2023-12-04 LAB — FOLLICLE STIMULATING HORMONE: FSH: 22.9 m[IU]/mL

## 2023-12-04 LAB — T3, FREE: T3, Free: 3.3 pg/mL (ref 2.3–4.2)

## 2023-12-04 LAB — T4, FREE: Free T4: 0.82 ng/dL (ref 0.60–1.60)

## 2023-12-04 NOTE — Telephone Encounter (Signed)
 Pharmacy Patient Advocate Encounter   Received notification from Patient Pharmacy that prior authorization for Trulicity 0.75 is required/requested.   Insurance verification completed.   The patient is insured through [facility] .    Per test claim: The current 28 day co-pay is, $953.73.  No PA needed at this time. This test claim was processed through [facility]- copay amounts may vary at other pharmacies due to pharmacy/plan contracts, or as the patient moves through the different stages of their insurance plan.

## 2023-12-05 ENCOUNTER — Ambulatory Visit: Payer: Self-pay | Admitting: Family

## 2023-12-06 ENCOUNTER — Other Ambulatory Visit: Payer: Self-pay | Admitting: Family

## 2023-12-06 MED ORDER — LOSARTAN POTASSIUM-HCTZ 50-12.5 MG PO TABS: 1.0000 | ORAL_TABLET | Freq: Every day | ORAL | 1 refills | Status: DC

## 2023-12-06 NOTE — Telephone Encounter (Signed)
 Copied from CRM [id number]. Topic: Clinical - Medication Refill >> Dec 06, 2023  9:48 AM Viola F wrote: Medication: losartan -hydrochlorothiazide  (HYZAAR) 50-12.5 MG tablet [[id number]]  Has the patient contacted their pharmacy? Yes (Agent: If no, request that the patient contact the pharmacy for the refill. If patient does not wish to contact the pharmacy document the reason why and proceed with request.) (Agent: If yes, when and what did the pharmacy advise?)  This is the patient's preferred pharmacy:   [id number] [place], [place] - [address] [place] [address] Phone: [phone] Fax: [phone]   Is this the correct pharmacy for this prescription? Yes If no, delete pharmacy and type the correct one.   Has the prescription been filled recently? Yes  Is the patient out of the medication? Yes  Has the patient been seen for an appointment in the last year OR does the patient have an upcoming appointment? Yes  Can we respond through MyChart? Yes  Agent: Please be advised that Rx refills may take up to 3 business days. We ask that you follow-up with your pharmacy.

## 2023-12-06 NOTE — Telephone Encounter (Signed)
 Last OV 12/03/2023

## 2023-12-09 NOTE — Assessment & Plan Note (Signed)
 Ordered lipid panel, pending results. Work on low cholesterol diet and exercise as tolerated Referred to nutritionist for diabetic and cholesterol tips

## 2023-12-09 NOTE — Assessment & Plan Note (Signed)
 Pt is stable  Will order u/s to rule out abscess  Cbc ordered pending results.

## 2023-12-09 NOTE — Assessment & Plan Note (Signed)
 Pt advised to work on diet and exercise as tolerated

## 2023-12-09 NOTE — Assessment & Plan Note (Signed)
 Ordering tsh  Pending r/o thyroid  disease Consider starting exercise routine and focusing on diet

## 2023-12-09 NOTE — Assessment & Plan Note (Signed)
 Urine m/a ordered pending results.  Referred to nutritionist Start ozepmic 0.25 mg weekly pending insurance.

## 2023-12-09 NOTE — Assessment & Plan Note (Signed)
 Cont with f/u surgeon

## 2023-12-25 ENCOUNTER — Encounter: Payer: Self-pay | Admitting: Internal Medicine

## 2024-01-09 ENCOUNTER — Telehealth: Payer: Self-pay | Admitting: Family

## 2024-01-09 DIAGNOSIS — I10 Essential (primary) hypertension: Secondary | ICD-10-CM

## 2024-01-09 NOTE — Telephone Encounter (Signed)
 Copied from CRM [id number]. Topic: Clinical - Medication Question >> Jan 09, 2024  3:58 PM Suzen RAMAN wrote: Reason for CRM: Patient would like to know if there is an alternate medication she can take for losartan -hydrochlorothiazide  (HYZAAR) 50-12.5 MG tablet. Patient no longer has insurance and can no longer afford medication.(Preferable under $40) $156 w/o insurance. Patient has enough medication to last until Saturday.   CB#[phone] after 1:15 pm

## 2024-01-10 MED ORDER — LOSARTAN POTASSIUM 50 MG PO TABS: 50.0000 mg | ORAL_TABLET | Freq: Every day | ORAL | 3 refills | Status: AC

## 2024-01-10 MED ORDER — HYDROCHLOROTHIAZIDE 12.5 MG PO TABS
12.5000 mg | ORAL_TABLET | Freq: Every day | ORAL | 3 refills | Status: AC
Start: 2024-01-10 — End: ?

## 2024-01-10 NOTE — Addendum Note (Signed)
 Addended by: CORWIN ANTU on: 01/10/2024 10:26 PM   Modules accepted: Orders

## 2024-02-13 ENCOUNTER — Ambulatory Visit
Admission: EM | Admit: 2024-02-13 | Discharge: 2024-02-13 | Disposition: A | Discharge status: Home / Self Care | Payer: Self-pay | Attending: Nurse Practitioner | Admitting: Nurse Practitioner

## 2024-02-13 ENCOUNTER — Other Ambulatory Visit: Payer: Self-pay

## 2024-02-13 ENCOUNTER — Encounter: Payer: Self-pay | Admitting: Emergency Medicine

## 2024-02-13 ENCOUNTER — Ambulatory Visit (INDEPENDENT_AMBULATORY_CARE_PROVIDER_SITE_OTHER): Payer: Self-pay

## 2024-02-13 DIAGNOSIS — R110 Nausea: Secondary | ICD-10-CM

## 2024-02-13 DIAGNOSIS — R509 Fever, unspecified: Secondary | ICD-10-CM

## 2024-02-13 DIAGNOSIS — R350 Frequency of micturition: Secondary | ICD-10-CM

## 2024-02-13 DIAGNOSIS — S31109D Unspecified open wound of abdominal wall, unspecified quadrant without penetration into peritoneal cavity, subsequent encounter: Secondary | ICD-10-CM

## 2024-02-13 LAB — POCT URINE DIPSTICK
Bilirubin, UA: NEGATIVE
Blood, UA: NEGATIVE
Glucose, UA: NEGATIVE mg/dL
Ketones, POC UA: NEGATIVE mg/dL
Leukocytes, UA: NEGATIVE
Nitrite, UA: NEGATIVE
Spec Grav, UA: 1.02 (ref 1.010–1.025)
Urobilinogen, UA: 0.2 U/dL
pH, UA: 7 (ref 5.0–8.0)

## 2024-02-13 LAB — POC COVID19/FLU A&B COMBO
Covid Antigen, POC: NEGATIVE
Influenza A Antigen, POC: NEGATIVE
Influenza B Antigen, POC: NEGATIVE

## 2024-02-13 MED ORDER — IBUPROFEN 800 MG PO TABS
800.0000 mg | ORAL_TABLET | Freq: Once | ORAL | Status: AC
  Administered 2024-02-13: 800 mg via ORAL

## 2024-02-13 MED ORDER — ONDANSETRON 4 MG PO TBDP: 4.0000 mg | ORAL_TABLET | Freq: Three times a day (TID) | ORAL | 0 refills | Status: AC | PRN

## 2024-02-13 MED ORDER — ONDANSETRON 4 MG PO TBDP
4.0000 mg | ORAL_TABLET | Freq: Once | ORAL | Status: AC
  Administered 2024-02-13: 4 mg via ORAL

## 2024-02-13 NOTE — Discharge Instructions (Signed)
 We will contact you if the chest xray shows anything we need to discuss.    Your symptoms are consistent with a viral illness.  We gave you Zofran  and ibuprofen  today for nausea and fever.  Hydrate at home and continue Tylenol  as needed for fever.   Some things that can make you feel better are: - Increased rest - Increasing fluid with water /sugar free electrolytes - Acetaminophen  and ibuprofen  as needed for fever/pain - Salt water  gargling, chloraseptic spray and throat lozenges - OTC guaifenesin (Mucinex) 600 mg twice daily if congestion develops - Saline sinus flushes or a neti pot - Humidifying the air - Zofran  as needed for nausea/vomiting  I have referred you to the [facility] today to help the abdominal wound heal.

## 2024-02-13 NOTE — ED Triage Notes (Addendum)
 Pt reports headache, fever, chills, ear pain,nausea, urinary frequency x2 days. Pt reports has taken otc medication with minimal change in symptoms.

## 2024-02-13 NOTE — ED Notes (Signed)
 Assisted NP dress abdominal wound. Site cleaned with saline by NP. Vaseline gauze applied to site, reinforced with abd pad x2, secured with perforated tape. Pt tolerated well.

## 2024-02-13 NOTE — ED Provider Notes (Signed)
 [facility]    CSN: [id number] Arrival date & time: 02/13/24  1523      History   Chief Complaint Chief Complaint  Patient presents with   Headache    HPI Kaitlyn Bray is a 58 y.o. female.   Patient presents today with 2 day history of headache, ear fullness, fever, chills, nausea, nasal congestion and increased urinary frequency.  She denies body aches, significant cough, chest pain, shortness of breath, abdominal pain, vomiting, and diarrhea.  No new rash, runny nose, sore throat, burning with urination, or vaginal discharge.  She has taken Tylenol  for symptoms with mild temporary improvement.  She works in a daycare.  No known sicknesses going around daycare.  Medical history is complicated by type II diabetes and recently underwent hernia repair with dehiscence of abdominal wound that is still open.  She denies purulent drainage from the abdominal wound.  She checks her blood sugar and earlier today random blood sugar was 130.  Reports her insurance recently lapsed and she has to wait until January for it to reinstate.  She is not following up for the open abdominal wound, she and her husband are doing dressing changes at home.    Past Medical History:  Diagnosis Date   Abscess 01/02/2023   Asthma    Complication of anesthesia    H/O abdominal abscess 08/14/2023   Heart murmur    Hernia    Hypertension    New onset type 2 diabetes mellitus (HCC) 09/15/2022   PONV (postoperative nausea and vomiting)    Pre-diabetes    S/P hernia repair 08/26/2021   SBO (small bowel obstruction) (HCC) 09/15/2021    Patient Active Problem List   Diagnosis Date Noted   Leukocytosis 12/03/2023   Hyperglycemia 12/03/2023   Hyperlipidemia associated with type 2 diabetes mellitus (HCC) 12/03/2023   Generalized abdominal pain 12/03/2023   Blunt injury of abdomen 12/03/2023   H/O abdominal abscess 08/14/2023   Seasonal allergies 04/26/2023   Abnormal weight gain 04/26/2023    Positive ANA (antinuclear antibody) 04/26/2023   Ds DNA antibody positive 02/13/2023   Hashimoto's disease 02/12/2023   Open wnd anterior abdomen 02/12/2023   Controlled type 2 diabetes mellitus with complication, without long-term current use of insulin  (HCC) 01/26/2023   Limited range of motion (ROM) of shoulder 01/26/2023   Anemia 01/26/2023   Debility 11/16/2022   Physical deconditioning 11/10/2022   Peritonitis (HCC) 10/29/2022   Aortic atherosclerosis 01/17/2022   Morbid obesity (HCC) 01/17/2022   Other fatigue 01/17/2022   Hypocalcemia 01/17/2022   Vitamin D  deficiency 01/17/2022   Recurrent ventral hernia 09/15/2021   Urinary incontinence 06/23/2019   Constipation 06/23/2019   BPPV (benign paroxysmal positional vertigo) 03/20/2019   HTN (hypertension) 04/02/2017   Asthma 04/02/2017    Past Surgical History:  Procedure Laterality Date   ABDOMINAL HYSTERECTOMY     one ovary stlil in place. removed uterus cervix and pt thinks fallopian tubes   APPENDECTOMY     APPLICATION OF WOUND VAC N/A 11/04/2022   Procedure: APPLICATION OF WOUND VAC;  Surgeon: Lyndel Deward PARAS, MD;  Location: [facility];  Service: General;  Laterality: N/A;   CESAREAN SECTION     DEBRIDEMENT AND CLOSURE WOUND N/A 11/04/2022   Procedure: ABSORBABLE MESH CLOSURE OF ABDOMEN;  Surgeon: Lyndel Deward PARAS, MD;  Location: [facility];  Service: General;  Laterality: N/A;   INCISIONAL HERNIA REPAIR N/A 08/26/2021   Procedure: INCISIONAL HERNIA REPAIR;  Surgeon: Sebastian Moles, MD;  Location:  [facility];  Service: General;  Laterality: N/A;   INSERTION OF MESH N/A 08/26/2021   Procedure: INSERTION OF MESH;  Surgeon: Sebastian Moles, MD;  Location: [facility];  Service: General;  Laterality: N/A;   LAPAROTOMY N/A 07/29/2020   Procedure: EXPLORATORY LAPAROTOMY, REPAIR OF VENTRAL HERNIA;  Surgeon: Sebastian Moles, MD;  Location: [facility];  Service: General;  Laterality: N/A;   LAPAROTOMY N/A 10/29/2022   Procedure: EXPLORATORY  LAPAROTOMY WITH REPAIR OF SMALL BOWEL PERFORATION, AND PLACEMENT OF WOUND VAC;  Surgeon: Vanderbilt Ned, MD;  Location: [facility];  Service: General;  Laterality: N/A;   LAPAROTOMY N/A 10/31/2022   Procedure: EXPLORATORY LAPAROTOMY - WASH OUT OF ABDOMEN AND WOUND VAC PLACEMENT;  Surgeon: Vernetta Berg, MD;  Location: [facility];  Service: General;  Laterality: N/A;   LAPAROTOMY N/A 11/02/2022   Procedure: EXPLORATORY LAPAROTOMY Wound wash out of abdomen and wound vac change;  Surgeon: Vernetta Berg, MD;  Location: [facility];  Service: General;  Laterality: N/A;   MYOMECTOMY     XI ROBOTIC ASSISTED VENTRAL HERNIA N/A 10/23/2022   Procedure: ROBOTIC RECURRENT INCISIONAL HERNIA REPAIR WITH MESH WITH BILATERAL TRANVERSUS ABDOMINUS MYOFACIAL RELEASE AND BILATERAL POSTERIOR RECTUS MYOFACIAL RELEASE;  Surgeon: Lyndel Deward PARAS, MD;  Location: [facility];  Service: General;  Laterality: N/A;    OB History   No obstetric history on file.      Home Medications    Prior to Admission medications   Medication Sig Start Date End Date Taking? Authorizing Provider  ondansetron  (ZOFRAN -ODT) 4 MG disintegrating tablet Take 1 tablet (4 mg total) by mouth every 8 (eight) hours as needed for nausea or vomiting. 02/13/24  Yes Chandra Harlene LABOR, NP  acetaminophen  (TYLENOL ) 325 MG tablet Take 1-2 tablets (325-650 mg total) by mouth every 4 (four) hours as needed for mild pain. 11/22/22   Angiulli, Toribio PARAS, PA-C  Ascorbic Acid  (VITAMIN C  PO) Take 1 capsule by mouth daily.    [provider]  benzonatate  (TESSALON ) 200 MG capsule Take 1 capsule (200 mg total) by mouth 2 (two) times daily as needed for cough. 04/26/23   Corwin Antu, FNP  Cyanocobalamin  (B-12 PO) Take 1 tablet by mouth daily.    [provider]  ferrous sulfate 324 MG TBEC Take 324 mg by mouth every other day. Patient not taking: Reported on 12/03/2023    [provider]  hydrochlorothiazide  (HYDRODIURIL ) 12.5 MG tablet Take  1 tablet (12.5 mg total) by mouth daily. 01/10/24   Corwin Antu, FNP  losartan  (COZAAR ) 50 MG tablet Take 1 tablet (50 mg total) by mouth daily. 01/10/24   Dugal, Tabitha, FNP  meclizine  (ANTIVERT ) 25 MG tablet Take 1 tablet (25 mg total) by mouth 3 (three) times daily as needed for dizziness. 11/23/22   Angiulli, Toribio PARAS, PA-C  metFORMIN  (GLUCOPHAGE -XR) 500 MG 24 hr tablet TAKE 1 TABLET (500 MG TOTAL) BY MOUTH 2 (TWO) TIMES DAILY. TAKE TWO TABLETS TWICE DAILY 11/06/23   Dugal, Tabitha, FNP  ondansetron  (ZOFRAN ) 4 MG tablet Take 1 tablet (4 mg total) by mouth every 8 (eight) hours as needed for nausea or vomiting. 12/03/23   Corwin Antu, FNP    Family History Family History  Problem Relation Age of Onset   Hypertension Mother    Stroke Mother    Dementia Mother    Heart disease Mother    Hyperlipidemia Mother    COPD Father    Rheum arthritis Father    Mental illness Father  Heart disease Maternal Grandmother    Heart disease Maternal Grandfather    Alzheimer's disease Maternal Grandfather    Uterine cancer Paternal Grandmother     Social History Social History   Tobacco Use   Smoking status: Never   Smokeless tobacco: Never  Vaping Use   Vaping status: Never Used  Substance Use Topics   Alcohol use: No   Drug use: No     Allergies   Latex, Codeine, and Sulfa drugs cross reactors   Review of Systems Review of Systems Per HPI  Physical Exam Triage Vital Signs ED Triage Vitals  Encounter Vitals Group     BP 02/13/24 1537 (!) 177/96     Girls Systolic BP Percentile --      Girls Diastolic BP Percentile --      Boys Systolic BP Percentile --      Boys Diastolic BP Percentile --      Pulse Rate 02/13/24 1537 (!) 108     Resp 02/13/24 1537 20     Temp 02/13/24 1537 (!) 102.2 F (39 C)     Temp Source 02/13/24 1537 Oral     SpO2 02/13/24 1537 93 %     Weight --      Height --      Head Circumference --      Peak Flow --      Pain Score 02/13/24 1535 8      Pain Loc --      Pain Education --      Exclude from Growth Chart --    No data found.  Updated Vital Signs BP (!) 177/96 (BP Location: Right Arm)   Pulse (!) 108   Temp (!) 102.2 F (39 C) (Oral) Comment: last took tylenol  at 1300.  Resp 20   SpO2 93%   Visual Acuity Right Eye Distance:   Left Eye Distance:   Bilateral Distance:    Right Eye Near:   Left Eye Near:    Bilateral Near:     Physical Exam Vitals and nursing note reviewed.  Constitutional:      General: She is not in acute distress.    Appearance: Normal appearance. She is not ill-appearing or toxic-appearing.  HENT:     Head: Normocephalic and atraumatic.     Right Ear: Ear canal and external ear normal. A middle ear effusion is present.     Left Ear: Ear canal and external ear normal. A middle ear effusion is present.     Nose: No congestion or rhinorrhea.     Mouth/Throat:     Mouth: Mucous membranes are moist.     Pharynx: Oropharynx is clear. No oropharyngeal exudate or posterior oropharyngeal erythema.  Eyes:     General: No scleral icterus.    Extraocular Movements: Extraocular movements intact.  Cardiovascular:     Rate and Rhythm: Regular rhythm. Tachycardia present.  Pulmonary:     Effort: Pulmonary effort is normal. No respiratory distress.     Breath sounds: Decreased breath sounds present. No wheezing, rhonchi or rales.  Abdominal:     General: Abdomen is flat. Bowel sounds are normal. There is no distension.     Palpations: Abdomen is soft.     Tenderness: There is no abdominal tenderness. There is no guarding.     Comments: Open abdominal wound with scant bloody drainage, no surrounding cellulitis or purulence  Musculoskeletal:     Cervical back: Normal range of motion and neck supple.  Lymphadenopathy:  Cervical: No cervical adenopathy.  Skin:    General: Skin is warm and dry.     Capillary Refill: Capillary refill takes less than 2 seconds.     Coloration: Skin is not jaundiced  or pale.     Findings: No erythema or rash.  Neurological:     Mental Status: She is alert and oriented to person, place, and time.  Psychiatric:        Behavior: Behavior is cooperative.      UC Treatments / Results  Labs (all labs ordered are listed, but only abnormal results are displayed) Labs Reviewed  POCT URINE DIPSTICK  POC COVID19/FLU A&B COMBO    EKG   Radiology No results found.  Procedures Procedures (including critical care time)  Medications Ordered in UC Medications  ondansetron  (ZOFRAN -ODT) disintegrating tablet 4 mg (4 mg Oral Given 02/13/24 1542)  ibuprofen  (ADVIL ) tablet 800 mg (800 mg Oral Given 02/13/24 1622)    Initial Impression / Assessment and Plan / UC Course  I have reviewed the triage vital signs and the nursing notes.  Pertinent labs & imaging results that were available during my care of the patient were reviewed by me and considered in my medical decision making (see chart for details).   Patient is hypertensive, febrile, and tachycardic initially in triage today.  She is not tachypneic and oxygen saturation is normal on room air.     The patient was given the opportunity to ask questions.  All questions answered to their satisfaction.  The patient is in agreement to this plan.   Final Clinical Impressions(s) / UC Diagnoses   Final diagnoses:  Urinary frequency  Fever, unspecified  Open wound of abdominal wall, subsequent encounter  Nausea without vomiting   Discharge Instructions   None    ED Prescriptions     Medication Sig Dispense Auth. Provider   ondansetron  (ZOFRAN -ODT) 4 MG disintegrating tablet Take 1 tablet (4 mg total) by mouth every 8 (eight) hours as needed for nausea or vomiting. 20 tablet Chandra Harlene LABOR, NP      PDMP not reviewed this encounter.

## 2024-02-17 ENCOUNTER — Encounter (HOSPITAL_BASED_OUTPATIENT_CLINIC_OR_DEPARTMENT_OTHER): Payer: Self-pay | Admitting: Emergency Medicine

## 2024-02-17 ENCOUNTER — Emergency Department (HOSPITAL_BASED_OUTPATIENT_CLINIC_OR_DEPARTMENT_OTHER): Payer: Self-pay | Admitting: Radiology

## 2024-02-17 ENCOUNTER — Emergency Department (HOSPITAL_BASED_OUTPATIENT_CLINIC_OR_DEPARTMENT_OTHER)
Admission: EM | Admit: 2024-02-17 | Discharge: 2024-02-17 | Disposition: A | Discharge status: Home / Self Care | Payer: Self-pay | Attending: Emergency Medicine | Admitting: Emergency Medicine

## 2024-02-17 DIAGNOSIS — I10 Essential (primary) hypertension: Secondary | ICD-10-CM | POA: Insufficient documentation

## 2024-02-17 DIAGNOSIS — E119 Type 2 diabetes mellitus without complications: Secondary | ICD-10-CM | POA: Insufficient documentation

## 2024-02-17 DIAGNOSIS — R509 Fever, unspecified: Secondary | ICD-10-CM | POA: Insufficient documentation

## 2024-02-17 DIAGNOSIS — Z79899 Other long term (current) drug therapy: Secondary | ICD-10-CM | POA: Insufficient documentation

## 2024-02-17 DIAGNOSIS — Z91040 Latex allergy status: Secondary | ICD-10-CM | POA: Insufficient documentation

## 2024-02-17 LAB — COMPREHENSIVE METABOLIC PANEL WITH GFR
ALT: 36 U/L (ref 0–44)
AST: 39 U/L (ref 15–41)
Albumin: 3.8 g/dL (ref 3.5–5.0)
Alkaline Phosphatase: 94 U/L (ref 38–126)
Anion gap: 9 (ref 5–15)
BUN: 13 mg/dL (ref 6–20)
CO2: 32 mmol/L (ref 22–32)
Calcium: 9.9 mg/dL (ref 8.9–10.3)
Chloride: 97 mmol/L — ABNORMAL LOW (ref 98–111)
Creatinine, Ser: 0.6 mg/dL (ref 0.44–1.00)
GFR, Estimated: >60 mL/min (ref >60)
Glucose, Bld: 115 mg/dL — ABNORMAL HIGH (ref 70–99)
Potassium: 4 mmol/L (ref 3.5–5.1)
Sodium: 138 mmol/L (ref 135–145)
Total Bilirubin: 0.4 mg/dL (ref 0.0–1.2)
Total Protein: 7.2 g/dL (ref 6.5–8.1)

## 2024-02-17 LAB — URINALYSIS, W/ REFLEX TO CULTURE (INFECTION SUSPECTED)
Bacteria, UA: NONE SEEN
Bilirubin Urine: NEGATIVE
Glucose, UA: NEGATIVE mg/dL
Hgb urine dipstick: NEGATIVE
Ketones, ur: NEGATIVE mg/dL
Leukocytes,Ua: NEGATIVE
Nitrite: NEGATIVE
Protein, ur: NEGATIVE mg/dL
Specific Gravity, Urine: 1.019 (ref 1.005–1.030)
pH: 6 (ref 5.0–8.0)

## 2024-02-17 LAB — RESP PANEL BY RT-PCR (RSV, FLU A&B, COVID)  RVPGX2
Influenza A by PCR: NEGATIVE
Influenza B by PCR: NEGATIVE
Resp Syncytial Virus by PCR: NEGATIVE
SARS Coronavirus 2 by RT PCR: NEGATIVE

## 2024-02-17 LAB — CBC WITH DIFFERENTIAL/PLATELET
Abs Immature Granulocytes: 0.06 K/uL (ref 0.00–0.07)
Basophils Absolute: 0 K/uL (ref 0.0–0.1)
Basophils Relative: 1 %
Eosinophils Absolute: 0.1 K/uL (ref 0.0–0.5)
Eosinophils Relative: 2 %
HCT: 40.1 % (ref 36.0–46.0)
Hemoglobin: 12.9 g/dL (ref 12.0–15.0)
Immature Granulocytes: 1 %
Lymphocytes Relative: 23 %
Lymphs Abs: 1.2 K/uL (ref 0.7–4.0)
MCH: 26.9 pg (ref 26.0–34.0)
MCHC: 32.2 g/dL (ref 30.0–36.0)
MCV: 83.7 fL (ref 80.0–100.0)
Monocytes Absolute: 0.5 K/uL (ref 0.1–1.0)
Monocytes Relative: 9 %
Neutro Abs: 3.4 K/uL (ref 1.7–7.7)
Neutrophils Relative %: 64 %
Platelets: 165 K/uL (ref 150–400)
RBC: 4.79 MIL/uL (ref 3.87–5.11)
RDW: 14.7 % (ref 11.5–15.5)
WBC: 5.2 K/uL (ref 4.0–10.5)
nRBC: 0 % (ref 0.0–0.2)

## 2024-02-17 MED ORDER — AZITHROMYCIN 250 MG PO TABS: ORAL_TABLET | ORAL | 0 refills | Status: AC

## 2024-02-17 NOTE — ED Notes (Signed)
 Patient transported to X-ray

## 2024-02-17 NOTE — ED Provider Notes (Signed)
 [place] [facility]MENT AT [facility]  Provider Note  CSN: [id number] Arrival date & time: 02/17/24 1034  History Chief Complaint  Patient presents with   Fever    Era Parr is a 58 y.o. female with history of HTN, DM, prior SBO and hernia surgery with dehiscence and poor abdominal wound healing here for evaluation of persistent fever x 6-7 days, seen at UC on 10/1 and had negative Covid, UA and CXR. Has continued to have occasional fevers, some congestion and urinary frequency but no significant nasal congestion, productive cough, N/V/D.    Home Medications Prior to Admission medications   Medication Sig Start Date End Date Taking? Authorizing Provider  azithromycin  (ZITHROMAX  Z-PAK) 250 MG tablet Take 2 tablets (500 mg total) by mouth daily for 1 day, THEN 1 tablet (250 mg total) daily for 4 days. 02/17/24 02/22/24 Yes Roselyn Carlin NOVAK, MD  acetaminophen  (TYLENOL ) 325 MG tablet Take 1-2 tablets (325-650 mg total) by mouth every 4 (four) hours as needed for mild pain. 11/22/22   Angiulli, Toribio PARAS, PA-C  Ascorbic Acid  (VITAMIN C  PO) Take 1 capsule by mouth daily.    [provider]  benzonatate  (TESSALON ) 200 MG capsule Take 1 capsule (200 mg total) by mouth 2 (two) times daily as needed for cough. 04/26/23   Corwin Antu, FNP  Cyanocobalamin  (B-12 PO) Take 1 tablet by mouth daily.    [provider]  ferrous sulfate 324 MG TBEC Take 324 mg by mouth every other day. Patient not taking: Reported on 12/03/2023    [provider]  hydrochlorothiazide  (HYDRODIURIL ) 12.5 MG tablet Take 1 tablet (12.5 mg total) by mouth daily. 01/10/24   Corwin Antu, FNP  losartan  (COZAAR ) 50 MG tablet Take 1 tablet (50 mg total) by mouth daily. 01/10/24   Dugal, Tabitha, FNP  meclizine  (ANTIVERT ) 25 MG tablet Take 1 tablet (25 mg total) by mouth 3 (three) times daily as needed for dizziness. 11/23/22   Angiulli, Toribio PARAS, PA-C  metFORMIN  (GLUCOPHAGE -XR) 500  MG 24 hr tablet TAKE 1 TABLET (500 MG TOTAL) BY MOUTH 2 (TWO) TIMES DAILY. TAKE TWO TABLETS TWICE DAILY 11/06/23   Dugal, Tabitha, FNP  ondansetron  (ZOFRAN ) 4 MG tablet Take 1 tablet (4 mg total) by mouth every 8 (eight) hours as needed for nausea or vomiting. 12/03/23   Corwin Antu, FNP  ondansetron  (ZOFRAN -ODT) 4 MG disintegrating tablet Take 1 tablet (4 mg total) by mouth every 8 (eight) hours as needed for nausea or vomiting. 02/13/24   Chandra Harlene LABOR, NP     Allergies    Latex, Codeine, and Sulfa drugs cross reactors   Review of Systems   Review of Systems Please see HPI for pertinent positives and negatives  Physical Exam BP (!) 146/77   Pulse 82   Temp 98.1 F (36.7 C) (Oral)   Resp 20   SpO2 94%   Physical Exam Vitals and nursing note reviewed.  Constitutional:      Appearance: Normal appearance.  HENT:     Head: Normocephalic and atraumatic.     Nose: Nose normal.     Mouth/Throat:     Mouth: Mucous membranes are moist.  Eyes:     Extraocular Movements: Extraocular movements intact.     Conjunctiva/sclera: Conjunctivae normal.  Cardiovascular:     Rate and Rhythm: Normal rate.  Pulmonary:     Effort: Pulmonary effort is normal.     Breath sounds: Normal breath sounds.  Abdominal:  General: Abdomen is flat.     Palpations: Abdomen is soft.     Tenderness: There is no abdominal tenderness.     Comments: Abdominal wound without signs of infection.   Musculoskeletal:        General: No swelling. Normal range of motion.     Cervical back: Neck supple.  Skin:    General: Skin is warm and dry.  Neurological:     General: No focal deficit present.     Mental Status: She is alert.  Psychiatric:        Mood and Affect: Mood normal.     ED Results / Procedures / Treatments   EKG None  Procedures Procedures  Medications Ordered in the ED Medications - No data to display  Initial Impression and Plan  Patient here with persistent fever with no  clear etiology by history. 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   ED Course   Clinical Course as of 02/17/24 1316  Sun Feb 17, 2024  1220 I personally viewed the images from radiology studies and agree with radiologist interpretation: CXR is clear, UA is normal [CS]  1225 CBC is normal.  [CS]  1314 CMP is unremarkable. Covid/Flu/RSV swab is neg. No serious bacterial infection identified, however given duration of symptoms a trial of Abx is reasonable. She is aware that this will be ineffective for viral infections. Recommend continued supportive care, oral hydration, PCP follow up, RTED for any other concerns.   [CS]    Clinical Course User Index [CS] Roselyn Carlin NOVAK, MD     MDM Rules/Calculators/A&P Medical Decision Making Problems Addressed: Fever, unspecified fever cause: acute illness or injury  Amount and/or Complexity of Data Reviewed Labs: ordered. Decision-making details documented in ED Course. Radiology: ordered and independent interpretation performed. Decision-making details documented in ED Course.  Risk Prescription drug management.     Final Clinical Impression(s) / ED Diagnoses Final diagnoses:  Fever, unspecified fever cause    Rx / DC Orders ED Discharge Orders          Ordered    azithromycin  (ZITHROMAX  Z-PAK) 250 MG tablet  Daily        02/17/24 1316             Roselyn Carlin NOVAK, MD 02/17/24 1316

## 2024-02-17 NOTE — ED Notes (Signed)
 Reviewed AVS/discharge instruction with patient. Time allotted for and all questions answered. Patient is agreeable for d/c and escorted to ed exit by staff.

## 2024-02-17 NOTE — ED Triage Notes (Signed)
 Fevers started Wednesday.  Neg for covid and UTI at [facility] Reports continued fever treated with tylenol .

## 2024-03-17 ENCOUNTER — Encounter: Payer: Self-pay | Admitting: Radiology
